# Patient Record
Sex: Female | Born: 1935 | Race: White | Hispanic: No | Smoking: Never smoker
Health system: Southern US, Community
[De-identification: ages and names within clinical notes are randomized; demographics above are authoritative.]

## PROBLEM LIST (undated history)

## (undated) DIAGNOSIS — N6009 Solitary cyst of unspecified breast: Secondary | ICD-10-CM

## (undated) DIAGNOSIS — E871 Hypo-osmolality and hyponatremia: Secondary | ICD-10-CM

## (undated) DIAGNOSIS — K8020 Calculus of gallbladder without cholecystitis without obstruction: Secondary | ICD-10-CM

## (undated) DIAGNOSIS — K5792 Diverticulitis of intestine, part unspecified, without perforation or abscess without bleeding: Secondary | ICD-10-CM

## (undated) DIAGNOSIS — I10 Essential (primary) hypertension: Secondary | ICD-10-CM

## (undated) HISTORY — DX: Diverticulitis of intestine, part unspecified, without perforation or abscess without bleeding: K57.92

## (undated) HISTORY — DX: Hypo-osmolality and hyponatremia: E87.1

## (undated) HISTORY — DX: Calculus of gallbladder without cholecystitis without obstruction: K80.20

## (undated) HISTORY — PX: NASAL SINUS SURGERY: SHX719

## (undated) HISTORY — DX: Essential (primary) hypertension: I10

## (undated) HISTORY — DX: Solitary cyst of unspecified breast: N60.09

---
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
## 1988-12-12 HISTORY — PX: BREAST CYST INCISION AND DRAINAGE: SHX14

## 1988-12-12 HISTORY — PX: OTHER SURGICAL HISTORY: SHX169

## 1998-04-13 HISTORY — PX: ABDOMINAL HYSTERECTOMY: SHX81

## 2003-04-25 ENCOUNTER — Other Ambulatory Visit: Payer: Self-pay

## 2004-08-29 ENCOUNTER — Ambulatory Visit: Payer: Self-pay | Admitting: Family Medicine

## 2005-09-01 ENCOUNTER — Ambulatory Visit: Payer: Self-pay | Admitting: Ophthalmology

## 2005-09-08 ENCOUNTER — Ambulatory Visit: Payer: Self-pay | Admitting: Ophthalmology

## 2005-09-17 ENCOUNTER — Ambulatory Visit: Payer: Self-pay | Admitting: Family Medicine

## 2006-09-23 ENCOUNTER — Ambulatory Visit: Payer: Self-pay | Admitting: Family Medicine

## 2006-12-22 ENCOUNTER — Ambulatory Visit: Payer: Self-pay | Admitting: Family Medicine

## 2006-12-23 ENCOUNTER — Ambulatory Visit: Payer: Self-pay | Admitting: Family Medicine

## 2006-12-24 ENCOUNTER — Ambulatory Visit: Payer: Self-pay | Admitting: Family Medicine

## 2006-12-27 ENCOUNTER — Ambulatory Visit: Payer: Self-pay | Admitting: Family Medicine

## 2007-03-01 ENCOUNTER — Ambulatory Visit: Payer: Self-pay | Admitting: Gastroenterology

## 2007-03-31 ENCOUNTER — Ambulatory Visit (HOSPITAL_COMMUNITY): Admission: RE | Admit: 2007-03-31 | Discharge: 2007-03-31 | Payer: Self-pay | Admitting: Gastroenterology

## 2007-03-31 ENCOUNTER — Encounter: Payer: Self-pay | Admitting: Gastroenterology

## 2007-04-05 ENCOUNTER — Ambulatory Visit: Payer: Self-pay | Admitting: Gastroenterology

## 2007-09-26 ENCOUNTER — Ambulatory Visit: Payer: Self-pay | Admitting: Family Medicine

## 2007-12-20 ENCOUNTER — Ambulatory Visit: Payer: Self-pay | Admitting: Gastroenterology

## 2008-03-05 ENCOUNTER — Telehealth: Payer: Self-pay | Admitting: Gastroenterology

## 2008-03-12 ENCOUNTER — Encounter: Payer: Self-pay | Admitting: Gastroenterology

## 2008-03-12 DIAGNOSIS — K863 Pseudocyst of pancreas: Secondary | ICD-10-CM

## 2008-03-12 DIAGNOSIS — K862 Cyst of pancreas: Secondary | ICD-10-CM | POA: Insufficient documentation

## 2008-03-15 ENCOUNTER — Encounter: Payer: Self-pay | Admitting: Gastroenterology

## 2008-03-27 ENCOUNTER — Ambulatory Visit (HOSPITAL_COMMUNITY): Admission: RE | Admit: 2008-03-27 | Discharge: 2008-03-27 | Payer: Self-pay | Admitting: Oncology

## 2008-08-23 ENCOUNTER — Emergency Department: Payer: Self-pay | Admitting: Emergency Medicine

## 2008-10-02 ENCOUNTER — Ambulatory Visit: Payer: Self-pay | Admitting: Family Medicine

## 2009-11-07 ENCOUNTER — Emergency Department: Payer: Self-pay | Admitting: Emergency Medicine

## 2009-11-13 ENCOUNTER — Encounter (INDEPENDENT_AMBULATORY_CARE_PROVIDER_SITE_OTHER): Payer: Self-pay | Admitting: *Deleted

## 2009-12-04 ENCOUNTER — Ambulatory Visit: Payer: Self-pay | Admitting: Family Medicine

## 2010-05-13 NOTE — Miscellaneous (Signed)
Summary: mrcp  Clinical Lists Changes  Orders: Added new Referral order of MRCP (MRCP) - Signed  Appended Document: mrcp Patient to have bun and creatine drawn at [facility], Dr Venia Minks will order.  Patient will have lab core fax labs here to Attn. Dr Ardis Hughs office.  Dr Venia Minks office number is [phone]  Appended Document: mrcp faxed labs from [place],  BUN 12, CR 1.3.  This should be safe for MRI that we want to arrange.  Appended Document: mrcp [facility] scheduling the are aware of bun creatine, pt to have the MRI 03/27/09

## 2010-05-13 NOTE — Letter (Signed)
Summary: Appointment - Missed  [facility], [place]  [address] [place]   [place], [place] [address]   Phone: [phone]  Fax: [phone]     November 13, 2009 MRN: [id number]   [facility] [address], [place]  [id number]   Dear Ms. Murrell,  Our records indicate you missed your appointment on  11-12-2009    with  Dr. Haroldine Laws  It is very important that we reach you to reschedule this appointment. We look forward to participating in your health care needs. Please contact us at the number listed above at your earliest convenience to reschedule this appointment.     Sincerely,   [place] Scheduling Team

## 2010-05-13 NOTE — Progress Notes (Signed)
Summary: [facility] EUS  Phone Note Call from Patient Call back at [facility] Phone [phone]   Caller: Patient Call For: JACOBS Reason for Call: Talk to Nurse Summary of Call: PT HAD AN EUS LAS DEC. AND WAS TOLD TO HAVE A REPEAT IN 12 MONTHS WOULD LIKE TO [facility] APP B4 THE END F THE YEAR  Initial call taken by: Ronalee Red,  March 05, 2008 10:06 AM  Follow-up for Phone Call        Spoke with patient she is looking to schedule an EUS before the end of the year.  Waiting for an answetr from Dr Ardis Hughs. Georgianne Fick, RN  March 06, 2008 11:52 AM       Appended Document: [facility] EUS Dr Ardis Hughs,  I sent the chart to you for a review, patient would like it done before the end of year then her insurance is changing,  please advise.  Appended Document: [facility] EUS sorry it took so long, please apologize to Mrs. Samand as well.  I actually recommended that she have an MRCP with MRI abd/pancreas at same time (not a repeat EUS) in 12/09.  Can you please schedule that for her.  Appended Document: [facility] EUS Patient scheduled for a mrcp and an mri abd/ pelvic.  Patient to be npo 4 hours before procedure.  Bun and creatine ordered before test.  Patient aware and understands.

## 2010-05-13 NOTE — Miscellaneous (Signed)
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

## 2011-03-08 ENCOUNTER — Observation Stay: Payer: Self-pay | Admitting: Specialist

## 2011-09-16 ENCOUNTER — Emergency Department: Payer: Self-pay | Admitting: Emergency Medicine

## 2012-02-18 ENCOUNTER — Ambulatory Visit: Payer: Self-pay | Admitting: Family Medicine

## 2012-06-08 ENCOUNTER — Ambulatory Visit: Payer: Self-pay | Admitting: Family Medicine

## 2012-12-09 ENCOUNTER — Emergency Department: Payer: Self-pay | Admitting: Emergency Medicine

## 2012-12-09 LAB — URINALYSIS, COMPLETE
Bilirubin,UR: NEGATIVE
Blood: NEGATIVE
Glucose,UR: NEGATIVE mg/dL (ref 0–75)
Hyaline Cast: 11
Ketone: NEGATIVE
Nitrite: NEGATIVE
Ph: 5 (ref 4.5–8.0)
Protein: NEGATIVE
RBC,UR: 1 /HPF (ref 0–5)
Specific Gravity: 1.019 (ref 1.003–1.030)
Squamous Epithelial: <1
WBC UR: 16 /HPF (ref 0–5)

## 2012-12-09 LAB — CBC
HCT: 36.9 % (ref 35.0–47.0)
HGB: 13 g/dL (ref 12.0–16.0)
MCH: 32.2 pg (ref 26.0–34.0)
MCHC: 35.3 g/dL (ref 32.0–36.0)
MCV: 91 fL (ref 80–100)
Platelet: 147 10*3/uL — ABNORMAL LOW (ref 150–440)
RBC: 4.04 10*6/uL (ref 3.80–5.20)
RDW: 13.2 % (ref 11.5–14.5)
WBC: 7.5 10*3/uL (ref 3.6–11.0)

## 2012-12-09 LAB — CK TOTAL AND CKMB (NOT AT ARMC)
CK, Total: 171 U/L (ref 21–215)
CK-MB: 3.9 ng/mL — ABNORMAL HIGH (ref 0.5–3.6)

## 2012-12-09 LAB — TROPONIN I: Troponin-I: 0.06 ng/mL — ABNORMAL HIGH

## 2012-12-09 LAB — COMPREHENSIVE METABOLIC PANEL
Albumin: 3.6 g/dL (ref 3.4–5.0)
Alkaline Phosphatase: 92 U/L (ref 50–136)
Anion Gap: 8 (ref 7–16)
BUN: 25 mg/dL — ABNORMAL HIGH (ref 7–18)
Bilirubin,Total: 0.3 mg/dL (ref 0.2–1.0)
Calcium, Total: 9.9 mg/dL (ref 8.5–10.1)
Chloride: 105 mmol/L (ref 98–107)
Co2: 23 mmol/L (ref 21–32)
Creatinine: 1.28 mg/dL (ref 0.60–1.30)
EGFR (African American): 44 — ABNORMAL LOW
EGFR (Non-African Amer.): 38 — ABNORMAL LOW
Glucose: 123 mg/dL — ABNORMAL HIGH (ref 65–99)
Osmolality: 278 (ref 275–301)
Potassium: 3.8 mmol/L (ref 3.5–5.1)
SGOT(AST): 32 U/L (ref 15–37)
SGPT (ALT): 17 U/L (ref 12–78)
Sodium: 136 mmol/L (ref 136–145)
Total Protein: 6.6 g/dL (ref 6.4–8.2)

## 2012-12-16 ENCOUNTER — Ambulatory Visit: Payer: Self-pay | Admitting: Family Medicine

## 2013-04-10 ENCOUNTER — Ambulatory Visit: Payer: Self-pay | Admitting: Family Medicine

## 2013-06-30 ENCOUNTER — Emergency Department: Payer: Self-pay | Admitting: Emergency Medicine

## 2013-06-30 LAB — COMPREHENSIVE METABOLIC PANEL
Albumin: 4.5 g/dL (ref 3.4–5.0)
Alkaline Phosphatase: 117 U/L
Anion Gap: 1 — ABNORMAL LOW (ref 7–16)
BUN: 22 mg/dL — ABNORMAL HIGH (ref 7–18)
Bilirubin,Total: 0.5 mg/dL (ref 0.2–1.0)
Calcium, Total: 10.3 mg/dL — ABNORMAL HIGH (ref 8.5–10.1)
Chloride: 96 mmol/L — ABNORMAL LOW (ref 98–107)
Co2: 32 mmol/L (ref 21–32)
Creatinine: 0.98 mg/dL (ref 0.60–1.30)
EGFR (African American): >60
EGFR (Non-African Amer.): 52 — ABNORMAL LOW
Glucose: 103 mg/dL — ABNORMAL HIGH (ref 65–99)
Osmolality: 263 (ref 275–301)
Potassium: 3.8 mmol/L (ref 3.5–5.1)
SGOT(AST): 32 U/L (ref 15–37)
SGPT (ALT): 18 U/L (ref 12–78)
Sodium: 129 mmol/L — ABNORMAL LOW (ref 136–145)
Total Protein: 8.2 g/dL (ref 6.4–8.2)

## 2013-06-30 LAB — CBC WITH DIFFERENTIAL/PLATELET
Basophil #: 0 10*3/uL (ref 0.0–0.1)
Basophil %: 0.5 %
Eosinophil #: 0.1 10*3/uL (ref 0.0–0.7)
Eosinophil %: 1.2 %
HCT: 38.5 % (ref 35.0–47.0)
HGB: 13.7 g/dL (ref 12.0–16.0)
Lymphocyte #: 1.8 10*3/uL (ref 1.0–3.6)
Lymphocyte %: 28.7 %
MCH: 32.9 pg (ref 26.0–34.0)
MCHC: 35.6 g/dL (ref 32.0–36.0)
MCV: 92 fL (ref 80–100)
Monocyte #: 0.5 x10 3/mm (ref 0.2–0.9)
Monocyte %: 8.5 %
Neutrophil #: 3.8 10*3/uL (ref 1.4–6.5)
Neutrophil %: 61.1 %
Platelet: 150 10*3/uL (ref 150–440)
RBC: 4.16 10*6/uL (ref 3.80–5.20)
RDW: 13 % (ref 11.5–14.5)
WBC: 6.2 10*3/uL (ref 3.6–11.0)

## 2013-06-30 LAB — URINALYSIS, COMPLETE
Bacteria: NONE SEEN
Bilirubin,UR: NEGATIVE
Blood: NEGATIVE
Glucose,UR: NEGATIVE mg/dL (ref 0–75)
Ketone: NEGATIVE
Nitrite: NEGATIVE
Ph: 7 (ref 4.5–8.0)
Protein: NEGATIVE
RBC,UR: <1 /HPF (ref 0–5)
Specific Gravity: 1.011 (ref 1.003–1.030)
Squamous Epithelial: NONE SEEN
WBC UR: 4 /HPF (ref 0–5)

## 2013-06-30 LAB — TROPONIN I: Troponin-I: 0.04 ng/mL

## 2013-08-09 ENCOUNTER — Inpatient Hospital Stay: Payer: Self-pay | Admitting: Internal Medicine

## 2013-08-09 LAB — COMPREHENSIVE METABOLIC PANEL
Albumin: 3.9 g/dL (ref 3.4–5.0)
Alkaline Phosphatase: 310 U/L — ABNORMAL HIGH
Anion Gap: 11 (ref 7–16)
BUN: 19 mg/dL — ABNORMAL HIGH (ref 7–18)
Bilirubin,Total: 2.1 mg/dL — ABNORMAL HIGH (ref 0.2–1.0)
Calcium, Total: 10 mg/dL (ref 8.5–10.1)
Chloride: 101 mmol/L (ref 98–107)
Co2: 25 mmol/L (ref 21–32)
Creatinine: 1.1 mg/dL (ref 0.60–1.30)
EGFR (African American): 53 — ABNORMAL LOW
EGFR (Non-African Amer.): 45 — ABNORMAL LOW
Glucose: 117 mg/dL — ABNORMAL HIGH (ref 65–99)
Osmolality: 277 (ref 275–301)
Potassium: 3.9 mmol/L (ref 3.5–5.1)
SGOT(AST): 1767 U/L — ABNORMAL HIGH (ref 15–37)
SGPT (ALT): 992 U/L — ABNORMAL HIGH (ref 12–78)
Sodium: 137 mmol/L (ref 136–145)
Total Protein: 7.8 g/dL (ref 6.4–8.2)

## 2013-08-09 LAB — CBC
HCT: 37.9 % (ref 35.0–47.0)
HGB: 12.8 g/dL (ref 12.0–16.0)
MCH: 31.8 pg (ref 26.0–34.0)
MCHC: 33.8 g/dL (ref 32.0–36.0)
MCV: 94 fL (ref 80–100)
Platelet: 138 10*3/uL — ABNORMAL LOW (ref 150–440)
RBC: 4.03 10*6/uL (ref 3.80–5.20)
RDW: 12.9 % (ref 11.5–14.5)
WBC: 6 10*3/uL (ref 3.6–11.0)

## 2013-08-09 LAB — URINALYSIS, COMPLETE
Bacteria: NONE SEEN
Bilirubin,UR: NEGATIVE
Glucose,UR: NEGATIVE mg/dL (ref 0–75)
Ketone: NEGATIVE
Leukocyte Esterase: NEGATIVE
Nitrite: NEGATIVE
Ph: 7 (ref 4.5–8.0)
Protein: 30
RBC,UR: 3 /HPF (ref 0–5)
Specific Gravity: 1.014 (ref 1.003–1.030)
Squamous Epithelial: NONE SEEN
WBC UR: <1 /HPF (ref 0–5)

## 2013-08-09 LAB — LIPASE, BLOOD: Lipase: 252 U/L (ref 73–393)

## 2013-08-09 LAB — CK TOTAL AND CKMB (NOT AT ARMC)
CK, Total: 159 U/L
CK-MB: 1.1 ng/mL (ref 0.5–3.6)

## 2013-08-09 LAB — PROTIME-INR
INR: 1
Prothrombin Time: 13.5 secs (ref 11.5–14.7)

## 2013-08-09 LAB — TROPONIN I: Troponin-I: 0.04 ng/mL

## 2013-08-10 LAB — CBC WITH DIFFERENTIAL/PLATELET
Basophil #: 0 10*3/uL (ref 0.0–0.1)
Basophil %: 0 %
Eosinophil #: 0 10*3/uL (ref 0.0–0.7)
Eosinophil %: 0 %
HCT: 33.7 % — ABNORMAL LOW (ref 35.0–47.0)
HGB: 11.6 g/dL — ABNORMAL LOW (ref 12.0–16.0)
Lymphocyte #: 0.4 10*3/uL — ABNORMAL LOW (ref 1.0–3.6)
Lymphocyte %: 4.1 %
MCH: 32.2 pg (ref 26.0–34.0)
MCHC: 34.3 g/dL (ref 32.0–36.0)
MCV: 94 fL (ref 80–100)
Monocyte #: 0.9 x10 3/mm (ref 0.2–0.9)
Monocyte %: 9.6 %
Neutrophil #: 7.8 10*3/uL — ABNORMAL HIGH (ref 1.4–6.5)
Neutrophil %: 86.3 %
Platelet: 126 10*3/uL — ABNORMAL LOW (ref 150–440)
RBC: 3.59 10*6/uL — ABNORMAL LOW (ref 3.80–5.20)
RDW: 13.1 % (ref 11.5–14.5)
WBC: 9 10*3/uL (ref 3.6–11.0)

## 2013-08-10 LAB — COMPREHENSIVE METABOLIC PANEL
Albumin: 3.3 g/dL — ABNORMAL LOW (ref 3.4–5.0)
Alkaline Phosphatase: 276 U/L — ABNORMAL HIGH
Anion Gap: 9 (ref 7–16)
BUN: 20 mg/dL — ABNORMAL HIGH (ref 7–18)
Bilirubin,Total: 2.5 mg/dL — ABNORMAL HIGH (ref 0.2–1.0)
Calcium, Total: 9.4 mg/dL (ref 8.5–10.1)
Chloride: 103 mmol/L (ref 98–107)
Co2: 26 mmol/L (ref 21–32)
Creatinine: 1.02 mg/dL (ref 0.60–1.30)
EGFR (African American): 58 — ABNORMAL LOW
EGFR (Non-African Amer.): 50 — ABNORMAL LOW
Glucose: 101 mg/dL — ABNORMAL HIGH (ref 65–99)
Osmolality: 278 (ref 275–301)
Potassium: 3.6 mmol/L (ref 3.5–5.1)
SGOT(AST): 1280 U/L — ABNORMAL HIGH (ref 15–37)
SGPT (ALT): 852 U/L — ABNORMAL HIGH (ref 12–78)
Sodium: 138 mmol/L (ref 136–145)
Total Protein: 6.7 g/dL (ref 6.4–8.2)

## 2013-08-11 LAB — CBC WITH DIFFERENTIAL/PLATELET
Basophil #: 0 10*3/uL (ref 0.0–0.1)
Basophil %: 0.2 %
Eosinophil #: 0 10*3/uL (ref 0.0–0.7)
Eosinophil %: 0.1 %
HCT: 29.8 % — ABNORMAL LOW (ref 35.0–47.0)
HGB: 10.2 g/dL — ABNORMAL LOW (ref 12.0–16.0)
Lymphocyte #: 0.6 10*3/uL — ABNORMAL LOW (ref 1.0–3.6)
Lymphocyte %: 6.5 %
MCH: 32 pg (ref 26.0–34.0)
MCHC: 34.1 g/dL (ref 32.0–36.0)
MCV: 94 fL (ref 80–100)
Monocyte #: 0.4 x10 3/mm (ref 0.2–0.9)
Monocyte %: 4 %
Neutrophil #: 8 10*3/uL — ABNORMAL HIGH (ref 1.4–6.5)
Neutrophil %: 89.2 %
Platelet: 108 10*3/uL — ABNORMAL LOW (ref 150–440)
RBC: 3.18 10*6/uL — ABNORMAL LOW (ref 3.80–5.20)
RDW: 13 % (ref 11.5–14.5)
WBC: 8.9 10*3/uL (ref 3.6–11.0)

## 2013-08-11 LAB — COMPREHENSIVE METABOLIC PANEL
Albumin: 2.6 g/dL — ABNORMAL LOW (ref 3.4–5.0)
Alkaline Phosphatase: 196 U/L — ABNORMAL HIGH
Anion Gap: 6 — ABNORMAL LOW (ref 7–16)
BUN: 21 mg/dL — ABNORMAL HIGH (ref 7–18)
Bilirubin,Total: 1.7 mg/dL — ABNORMAL HIGH (ref 0.2–1.0)
Calcium, Total: 8.4 mg/dL — ABNORMAL LOW (ref 8.5–10.1)
Chloride: 108 mmol/L — ABNORMAL HIGH (ref 98–107)
Co2: 25 mmol/L (ref 21–32)
Creatinine: 1.01 mg/dL (ref 0.60–1.30)
EGFR (African American): 58 — ABNORMAL LOW
EGFR (Non-African Amer.): 50 — ABNORMAL LOW
Glucose: 90 mg/dL (ref 65–99)
Osmolality: 280 (ref 275–301)
Potassium: 3.6 mmol/L (ref 3.5–5.1)
SGOT(AST): 324 U/L — ABNORMAL HIGH (ref 15–37)
SGPT (ALT): 413 U/L — ABNORMAL HIGH (ref 12–78)
Sodium: 139 mmol/L (ref 136–145)
Total Protein: 5.6 g/dL — ABNORMAL LOW (ref 6.4–8.2)

## 2013-08-12 LAB — COMPREHENSIVE METABOLIC PANEL
Albumin: 2.4 g/dL — ABNORMAL LOW (ref 3.4–5.0)
Alkaline Phosphatase: 198 U/L — ABNORMAL HIGH
Anion Gap: 7 (ref 7–16)
BUN: 16 mg/dL (ref 7–18)
Bilirubin,Total: 0.9 mg/dL (ref 0.2–1.0)
Calcium, Total: 8.5 mg/dL (ref 8.5–10.1)
Chloride: 111 mmol/L — ABNORMAL HIGH (ref 98–107)
Co2: 21 mmol/L (ref 21–32)
Creatinine: 0.76 mg/dL (ref 0.60–1.30)
EGFR (African American): >60
EGFR (Non-African Amer.): >60
Glucose: 85 mg/dL (ref 65–99)
Osmolality: 278 (ref 275–301)
Potassium: 3.3 mmol/L — ABNORMAL LOW (ref 3.5–5.1)
SGOT(AST): 131 U/L — ABNORMAL HIGH (ref 15–37)
SGPT (ALT): 241 U/L — ABNORMAL HIGH (ref 12–78)
Sodium: 139 mmol/L (ref 136–145)
Total Protein: 5.4 g/dL — ABNORMAL LOW (ref 6.4–8.2)

## 2013-08-15 LAB — CULTURE, BLOOD (SINGLE)

## 2013-08-29 ENCOUNTER — Encounter: Payer: Self-pay | Admitting: General Surgery

## 2013-08-29 ENCOUNTER — Ambulatory Visit (INDEPENDENT_AMBULATORY_CARE_PROVIDER_SITE_OTHER): Payer: Commercial Managed Care - HMO | Admitting: General Surgery

## 2013-08-29 VITALS — BP 134/86 | HR 68 | Resp 14 | Ht 63.0 in | Wt 132.0 lb

## 2013-08-29 DIAGNOSIS — K8050 Calculus of bile duct without cholangitis or cholecystitis without obstruction: Secondary | ICD-10-CM | POA: Insufficient documentation

## 2013-08-29 DIAGNOSIS — K8020 Calculus of gallbladder without cholecystitis without obstruction: Secondary | ICD-10-CM | POA: Insufficient documentation

## 2013-08-29 NOTE — Patient Instructions (Addendum)
Laparoscopic Cholecystectomy Laparoscopic cholecystectomy is surgery to remove the gallbladder. The gallbladder is located in the upper right part of the abdomen, behind the liver. It is a storage sac for bile produced in the liver. Bile aids in the digestion and absorption of fats. Cholecystectomy is often done for inflammation of the gallbladder (cholecystitis). This condition is usually caused by a buildup of gallstones (cholelithiasis) in your gallbladder. Gallstones can block the flow of bile, resulting in inflammation and pain. In severe cases, emergency surgery may be required. When emergency surgery is not required, you will have time to prepare for the procedure. Laparoscopic surgery is an alternative to open surgery. Laparoscopic surgery has a shorter recovery time. Your common bile duct may also need to be examined during the procedure. If stones are found in the common bile duct, they may be removed. LET [facility] CARE PROVIDER KNOW ABOUT:  Any allergies you have.  All medicines you are taking, including vitamins, herbs, eye drops, creams, and over-the-counter medicines.  Previous problems you or members of your family have had with the use of anesthetics.  Any blood disorders you have.  Previous surgeries you have had.  Medical conditions you have. RISKS AND COMPLICATIONS Generally, this is a safe procedure. However, as with any procedure, complications can occur. Possible complications include:  Infection.  Damage to the common bile duct, nerves, arteries, veins, or other internal organs such as the stomach, liver, or intestines.  Bleeding.  A stone may remain in the common bile duct.  A bile leak from the cyst duct that is clipped when your gallbladder is removed.  The need to convert to open surgery, which requires a larger incision in the abdomen. This may be necessary if your surgeon thinks it is not safe to continue with a laparoscopic procedure. BEFORE THE  PROCEDURE  Ask your health care provider about changing or stopping any regular medicines. You will need to stop taking aspirin or blood thinners at least 5 days prior to surgery.  Do not eat or drink anything after midnight the night before surgery.  Let your health care provider know if you develop a cold or other infectious problem before surgery. PROCEDURE   You will be given medicine to make you sleep through the procedure (general anesthetic). A breathing tube will be placed in your mouth.  When you are asleep, your surgeon will make several small cuts (incisions) in your abdomen.  A thin, lighted tube with a tiny camera on the end (laparoscope) is inserted through one of the small incisions. The camera on the laparoscope sends a picture to a TV screen in the operating room. This gives the surgeon a good view inside your abdomen.  A gas will be pumped into your abdomen. This expands your abdomen so that the surgeon has more room to perform the surgery.  Other tools needed for the procedure are inserted through the other incisions. The gallbladder is removed through one of the incisions.  After the removal of your gallbladder, the incisions will be closed with stitches, staples, or skin glue. AFTER THE PROCEDURE  You will be taken to a recovery area where your progress will be checked often.  You may be allowed to go home the same day if your pain is controlled and you can tolerate liquids. Document Released: 03/30/2005 Document Revised: 01/18/2013 Document Reviewed: 11/09/2012 [facility] Patient Information 2014 [place].   Patient has been scheduled for surgery at [facility] on 09/11/13. She will pre admit at  the hospital on 09/06/13 at 12:45 pm. Patient is aware of dates, time, and all instructions.

## 2013-08-29 NOTE — Progress Notes (Signed)
Patient ID: [name], female   DOB: Jan 21, 1925, 80 y.o.   MRN: [id number]  Chief Complaint  Patient presents with  . Abdominal Pain    evaluate gall bladder    HPI [name] is a 80 y.o. female.  Here for evaluation of abdominal pain and gallbladder. Patient states she has been having abdominal pain for three weeks. She was having sharp stabbing pain in her right upper quadrant and radiating to her back.   Patient was seen in the ER on 08/09/13, admitted with a diagnosis of cholangitis and underwent an ERCP with Verdie Shire, M.D. on 08/10/13.  A common bile duct stone was identified and removed. On presentation to the hospital the patient had marked elevation in her serum transaminases, SGPT 992, SGOT 1767. Total bilirubin 2.1. Alkaline phosphatase 310.  The patient reports feeling much better. She lives at home with a son who is a double amputee. HPI  Past Medical History  Diagnosis Date  . Hypertension   . Diverticulitis   . Gallstones   . Breast cyst     removed  . Low sodium levels     Past Surgical History  Procedure Laterality Date  . Bladder tack  1990's  . Appendectomy  1940's  . Abdominal hysterectomy  2000  . Breast cyst incision and drainage Left 1990's    No family history on file.  Social History History  Substance Use Topics  . Smoking status: Never Smoker   . Smokeless tobacco: Never Used  . Alcohol Use: No    Allergies  Allergen Reactions  . Keflex [Cephalexin] Rash    Current Outpatient Prescriptions  Medication Sig Dispense Refill  . enalapril (VASOTEC) 10 MG tablet Take 10 mg by mouth daily.       Marland Kitchen loratadine (ALLERGY RELIEF) 10 MG tablet Take 10 mg by mouth daily as needed for allergies.      . mirtazapine (REMERON) 15 MG tablet       . traZODone (DESYREL) 50 MG tablet Take 50 mg by mouth at bedtime.        No current facility-administered medications for this visit.    Review of Systems Review of Systems  Constitutional: Negative.    Respiratory: Negative.   Cardiovascular: Negative.   Gastrointestinal: Positive for abdominal pain. Negative for nausea, vomiting, diarrhea, constipation, blood in stool, abdominal distention, anal bleeding and rectal pain.    Blood pressure 134/86, pulse 68, resp. rate 14, height 5\' 3"  (1.6 m), weight 132 lb (59.875 kg).  Physical Exam Physical Exam  Constitutional: She is oriented to person, place, and time. She appears well-developed and well-nourished.  Eyes: Conjunctivae are normal.  Neck: Neck supple.  Cardiovascular: Normal rate, regular rhythm and normal heart sounds.   Pulmonary/Chest: Effort normal and breath sounds normal.  Abdominal: Soft. Normal appearance and bowel sounds are normal. There is no hepatomegaly. There is no tenderness.  Neurological: She is alert and oriented to person, place, and time.  Skin: Skin is warm and dry.    Data Reviewed ERCP report of August 10, 2013.  Hospital discharge summary.  Laboratory studies dated Aug 25, 2013 from her primary physician's office showed a hemoglobin of 11.7, white blood cell count of 5900. Normal differential. Estimated GFR of 56, BUN: 19, creatinine 0.9, sodium 132, bilirubin 0.4, alkaline phosphatase 145, normal SGOT and SGPT.  Assessment    Recent choledocholithiasis with sepsis, resolved. Cholelithiasis. Past history hyponatremia. Essential hypertension.     Plan  Indication for elective cholecystectomy to prevent further episodes of cholangitis were reviewed. The risks are felt to be modest and para-to the benefit of preventing further episodes of sepsis. The possibility of bleeding, infection, cardiovascular complications and bile duct injury were reviewed. An informational pressure was provided.  Considering her home situation, I think she will likely benefit from overnight observation be sure she is ambulating well. She reported that during her recent 4 day stay in the hospital "they never gotten out of bed,  and I was weak when I went home". We'll try not to let that happen again.     Patient has been scheduled for surgery at [facility] on 09/11/13. She will pre admit at the hospital on 09/06/13 at 12:45 pm. Patient is aware of dates, time, and all instructions.   PCP: Lelan Pons Byrnett 08/29/2013, 8:25 PM

## 2013-08-30 ENCOUNTER — Other Ambulatory Visit: Payer: Self-pay | Admitting: General Surgery

## 2013-08-30 DIAGNOSIS — K8050 Calculus of bile duct without cholangitis or cholecystitis without obstruction: Secondary | ICD-10-CM

## 2013-09-06 ENCOUNTER — Ambulatory Visit: Payer: Self-pay | Admitting: General Surgery

## 2013-09-11 ENCOUNTER — Ambulatory Visit: Payer: Self-pay | Admitting: General Surgery

## 2013-09-11 ENCOUNTER — Encounter: Payer: Self-pay | Admitting: General Surgery

## 2013-09-11 DIAGNOSIS — K8010 Calculus of gallbladder with chronic cholecystitis without obstruction: Secondary | ICD-10-CM

## 2013-09-11 HISTORY — PX: CHOLECYSTECTOMY: SHX55

## 2013-09-12 ENCOUNTER — Encounter: Payer: Self-pay | Admitting: General Surgery

## 2013-09-12 HISTORY — PX: ERCP W/ SPHICTEROTOMY: SHX1523

## 2013-09-12 LAB — BASIC METABOLIC PANEL
Anion Gap: 8 (ref 7–16)
BUN: 10 mg/dL (ref 7–18)
Calcium, Total: 8.7 mg/dL (ref 8.5–10.1)
Chloride: 100 mmol/L (ref 98–107)
Co2: 26 mmol/L (ref 21–32)
Creatinine: 0.95 mg/dL (ref 0.60–1.30)
EGFR (African American): >60
EGFR (Non-African Amer.): 54 — ABNORMAL LOW
Glucose: 83 mg/dL (ref 65–99)
Osmolality: 266 (ref 275–301)
Potassium: 4.1 mmol/L (ref 3.5–5.1)
Sodium: 134 mmol/L — ABNORMAL LOW (ref 136–145)

## 2013-09-12 LAB — HEPATIC FUNCTION PANEL A (ARMC)
Albumin: 2.8 g/dL — ABNORMAL LOW (ref 3.4–5.0)
Alkaline Phosphatase: 246 U/L — ABNORMAL HIGH
Bilirubin, Direct: 0.4 mg/dL — ABNORMAL HIGH (ref 0.00–0.20)
Bilirubin,Total: 1.2 mg/dL — ABNORMAL HIGH (ref 0.2–1.0)
SGOT(AST): 935 U/L — ABNORMAL HIGH (ref 15–37)
SGPT (ALT): 695 U/L — ABNORMAL HIGH (ref 12–78)
Total Protein: 5.7 g/dL — ABNORMAL LOW (ref 6.4–8.2)

## 2013-09-12 LAB — PATHOLOGY REPORT

## 2013-09-13 ENCOUNTER — Encounter: Payer: Self-pay | Admitting: General Surgery

## 2013-09-13 LAB — COMPREHENSIVE METABOLIC PANEL
Albumin: 2.8 g/dL — ABNORMAL LOW (ref 3.4–5.0)
Alkaline Phosphatase: 203 U/L — ABNORMAL HIGH
Anion Gap: 5 — ABNORMAL LOW (ref 7–16)
BUN: 12 mg/dL (ref 7–18)
Bilirubin,Total: 0.7 mg/dL (ref 0.2–1.0)
Calcium, Total: 8.8 mg/dL (ref 8.5–10.1)
Chloride: 101 mmol/L (ref 98–107)
Co2: 26 mmol/L (ref 21–32)
Creatinine: 0.83 mg/dL (ref 0.60–1.30)
EGFR (African American): >60
EGFR (Non-African Amer.): >60
Glucose: 58 mg/dL — ABNORMAL LOW (ref 65–99)
Osmolality: 262 (ref 275–301)
Potassium: 3.7 mmol/L (ref 3.5–5.1)
SGOT(AST): 384 U/L — ABNORMAL HIGH (ref 15–37)
SGPT (ALT): 396 U/L — ABNORMAL HIGH (ref 12–78)
Sodium: 132 mmol/L — ABNORMAL LOW (ref 136–145)
Total Protein: 5.7 g/dL — ABNORMAL LOW (ref 6.4–8.2)

## 2013-09-20 ENCOUNTER — Ambulatory Visit (INDEPENDENT_AMBULATORY_CARE_PROVIDER_SITE_OTHER): Payer: Self-pay | Admitting: General Surgery

## 2013-09-20 ENCOUNTER — Encounter: Payer: Self-pay | Admitting: General Surgery

## 2013-09-20 VITALS — BP 128/72 | HR 68 | Resp 14 | Ht 63.0 in | Wt 130.0 lb

## 2013-09-20 DIAGNOSIS — K8050 Calculus of bile duct without cholangitis or cholecystitis without obstruction: Secondary | ICD-10-CM

## 2013-09-20 DIAGNOSIS — E871 Hypo-osmolality and hyponatremia: Secondary | ICD-10-CM

## 2013-09-20 NOTE — Progress Notes (Signed)
Patient ID: [name], female   DOB: 1924-11-15, 80 y.o.   MRN: [id number]  Chief Complaint  Patient presents with  . Routine Post Op    Gallbladder    HPI [name] is a 80 y.o. female here today for her post op gallbladder surgery done on 09/11/13. Patient states she is doing well. Her energy level is not totally back to normal, but considering her surgery followed by ERCP this is not unexpected. She denies any dietary intolerant. No difficulty with diarrhea. Postprandial pain. HPI  Past Medical History  Diagnosis Date  . Hypertension   . Diverticulitis   . Gallstones   . Breast cyst     removed  . Low sodium levels     Past Surgical History  Procedure Laterality Date  . Bladder tack  1990's  . Appendectomy  1940's  . Abdominal hysterectomy  2000  . Breast cyst incision and drainage Left 1990's  . Ercp w/ sphicterotomy  09/12/2013    Recurrent stone identified post cholecystectomy.  . Cholecystectomy  09/11/13    Cholangiogram suggested a retained stone ductal dilatation.    No family history on file.  Social History History  Substance Use Topics  . Smoking status: Never Smoker   . Smokeless tobacco: Never Used  . Alcohol Use: No    Allergies  Allergen Reactions  . Keflex [Cephalexin] Rash    Current Outpatient Prescriptions  Medication Sig Dispense Refill  . enalapril (VASOTEC) 10 MG tablet Take 10 mg by mouth daily.       Marland Kitchen HYDROcodone-acetaminophen (NORCO/VICODIN) 5-325 MG per tablet Take 1 tablet by mouth as needed.      . loratadine (ALLERGY RELIEF) 10 MG tablet Take 10 mg by mouth daily as needed for allergies.      . mirtazapine (REMERON) 15 MG tablet       . traZODone (DESYREL) 50 MG tablet Take 50 mg by mouth at bedtime.        No current facility-administered medications for this visit.    Review of Systems Review of Systems  Constitutional: Negative.   Respiratory: Negative.   Cardiovascular: Negative.     Blood pressure 128/72, pulse  68, resp. rate 14, height 5\' 3"  (1.6 m), weight 130 lb (58.968 kg).  Physical Exam Physical Exam  Constitutional: She is oriented to person, place, and time. She appears well-developed and well-nourished.  Eyes: Conjunctivae are normal. No scleral icterus.  Neck: Neck supple.  Cardiovascular: Normal rate, regular rhythm and normal heart sounds.   Pulmonary/Chest: Effort normal and breath sounds normal.  Abdominal: Soft. Normal appearance and bowel sounds are normal. There is no tenderness.  Port sites looks clean and healing well.   Neurological: She is alert and oriented to person, place, and time.  Skin: Skin is warm and dry.    Data Reviewed Liver function studies obtained the day after the ERCP showed a marked improvement.  Assessment    Doing well status post cholecystectomy and ERCP.    Plan    We'll repeat her laboratory studies today. If they have return to near-normal values we'll plan for a followup on an as-needed basis.     PCP: Etheleen Mayhew 09/21/2013, 7:10 AM

## 2013-09-21 ENCOUNTER — Telehealth: Payer: Self-pay | Admitting: *Deleted

## 2013-09-21 ENCOUNTER — Encounter: Payer: Self-pay | Admitting: General Surgery

## 2013-09-21 LAB — BASIC METABOLIC PANEL
BUN/Creatinine Ratio: 11 (ref 11–26)
BUN: 9 mg/dL (ref 8–27)
CO2: 25 mmol/L (ref 18–29)
Calcium: 9.8 mg/dL (ref 8.7–10.3)
Chloride: 92 mmol/L — ABNORMAL LOW (ref 97–108)
Creatinine, Ser: 0.82 mg/dL (ref 0.57–1.00)
GFR calc Af Amer: 75 mL/min/{1.73_m2} (ref >59)
GFR calc non Af Amer: 65 mL/min/{1.73_m2} (ref >59)
Glucose: 93 mg/dL (ref 65–99)
Potassium: 4.6 mmol/L (ref 3.5–5.2)
Sodium: 129 mmol/L — ABNORMAL LOW (ref 134–144)

## 2013-09-21 LAB — HEPATIC FUNCTION PANEL
ALT: 57 IU/L — ABNORMAL HIGH (ref 0–32)
AST: 30 IU/L (ref 0–40)
Albumin: 4.5 g/dL (ref 3.5–4.7)
Alkaline Phosphatase: 155 IU/L — ABNORMAL HIGH (ref 39–117)
Bilirubin, Direct: 0.14 mg/dL (ref 0.00–0.40)
Total Bilirubin: 0.3 mg/dL (ref 0.0–1.2)
Total Protein: 6.7 g/dL (ref 6.0–8.5)

## 2013-09-21 NOTE — Telephone Encounter (Signed)
Message copied by Carson Myrtle on Thu Sep 21, 2013  8:43 AM ------      Message from: [place], Forest Gleason      Created: Thu Sep 21, 2013  7:38 AM       Notify the patient her labs look fine. Her sodium is almost normal, but good for her.  Follow up as needed with Dr. Venia Minks.      ----- Message -----         From: [facility] Lab Results In Interface         Sent: 09/21/2013   5:44 AM           To: Robert Bellow, MD                   ------

## 2013-09-21 NOTE — Telephone Encounter (Signed)
Notified patient as instructed, patient pleased. Discussed follow-up appointments with Dr Venia Minks, patient agrees

## 2014-02-12 ENCOUNTER — Encounter: Payer: Self-pay | Admitting: General Surgery

## 2014-03-06 ENCOUNTER — Ambulatory Visit (INDEPENDENT_AMBULATORY_CARE_PROVIDER_SITE_OTHER): Payer: Commercial Managed Care - HMO | Admitting: General Surgery

## 2014-03-06 ENCOUNTER — Encounter: Payer: Self-pay | Admitting: General Surgery

## 2014-03-06 VITALS — BP 140/72 | HR 76 | Resp 14 | Ht 63.0 in | Wt 126.0 lb

## 2014-03-06 DIAGNOSIS — K5909 Other constipation: Secondary | ICD-10-CM

## 2014-03-06 DIAGNOSIS — R1030 Lower abdominal pain, unspecified: Secondary | ICD-10-CM

## 2014-03-06 DIAGNOSIS — K648 Other hemorrhoids: Secondary | ICD-10-CM

## 2014-03-06 DIAGNOSIS — R1031 Right lower quadrant pain: Secondary | ICD-10-CM

## 2014-03-06 LAB — POC HEMOCCULT BLD/STL (OFFICE/1-CARD/DIAGNOSTIC): Fecal Occult Blood, POC: NEGATIVE

## 2014-03-06 NOTE — Progress Notes (Signed)
Patient ID: [name], female   DOB: January 21, 1925, 80 y.o.   MRN: [id number]  Chief Complaint  Patient presents with  . Other    rigth groin pain    HPI [name] is a 80 y.o. female here today for a evaluation of right groin pain. Patient states this has been going on for for a week. The sharpe pain was off and on.  After she saw Dr. Kenton [name] 02/19/14 she states she has had no more pain.    The patient does report that her laminations seem less complete than in the past. Moderate straining at stool. She's been aware of a small hemorrhoid and has on occasion made use of a medicated suppository with inconsistent results. She denies any bleeding or mucus in the stools. She reports her energy and appetite are at baseline. HPI  Past Medical History  Diagnosis Date  . Hypertension   . Diverticulitis   . Gallstones   . Breast cyst     removed  . Low sodium levels     Past Surgical History  Procedure Laterality Date  . Bladder tack  1990's  . Appendectomy  1940's  . Abdominal hysterectomy  2000  . Breast cyst incision and drainage Left 1990's  . Ercp w/ sphicterotomy  09/12/2013    Recurrent stone identified post cholecystectomy.  . Cholecystectomy  09/11/13    Cholangiogram suggested a retained stone ductal dilatation.    No family history on file.  Social History History  Substance Use Topics  . Smoking status: Never Smoker   . Smokeless tobacco: Never Used  . Alcohol Use: No    Allergies  Allergen Reactions  . Keflex [Cephalexin] Rash    Current Outpatient Prescriptions  Medication Sig Dispense Refill  . enalapril (VASOTEC) 10 MG tablet Take 10 mg by mouth daily.     Marland Kitchen loratadine (ALLERGY RELIEF) 10 MG tablet Take 10 mg by mouth daily as needed for allergies.    . traZODone (DESYREL) 50 MG tablet Take 50 mg by mouth at bedtime.      No current facility-administered medications for this visit.    Review of Systems Review of Systems  Constitutional: Negative.    Respiratory: Negative.   Cardiovascular: Negative.   Gastrointestinal: Positive for constipation. Negative for nausea, vomiting, abdominal pain, diarrhea, blood in stool, abdominal distention, anal bleeding and rectal pain.    Blood pressure 140/72, pulse 76, resp. rate 14, height 5\' 3"  (1.6 m), weight 126 lb (57.153 kg).   The patient's weight is down 2 pounds from her June 2015 exam.  Physical Exam Physical Exam  Constitutional: She is oriented to person, place, and time. She appears well-developed and well-nourished.  Eyes: Conjunctivae are normal. No scleral icterus.  Neck: Neck supple.  Cardiovascular: Normal rate, regular rhythm and normal heart sounds.   Pulmonary/Chest: Effort normal and breath sounds normal.  Abdominal: Soft. Normal appearance and bowel sounds are normal. There is no tenderness. No hernia.    Genitourinary: Rectal exam shows internal hemorrhoid.  Digital rectal exam showed a mild decreased tone. No rectal masses. Stool is Hemoccult negative. With vigorous straining a small 6 mm internal hemorrhoid was noted on the left anterior wall of the anus. No bleeding or friability noted.   Lymphadenopathy:    She has no cervical adenopathy.  Neurological: She is alert and oriented to person, place, and time.  Skin: Skin is warm and dry.    Assessment    Benign abdominal exam.  Moderate constipation, possible benefit to stool bulking agents.    Plan    Patient to return as needed. The patient will be encouraged to make use of a daily fiber supplement.        Robert Bellow 03/07/2014, 2:37 PM

## 2014-03-06 NOTE — Patient Instructions (Signed)
Patient to return as needed. The patient will be encouraged to make use of a daily fiber supplement.

## 2014-03-07 DIAGNOSIS — K5909 Other constipation: Secondary | ICD-10-CM | POA: Insufficient documentation

## 2014-03-07 DIAGNOSIS — R1031 Right lower quadrant pain: Secondary | ICD-10-CM | POA: Insufficient documentation

## 2014-03-07 DIAGNOSIS — K648 Other hemorrhoids: Secondary | ICD-10-CM | POA: Insufficient documentation

## 2014-03-18 ENCOUNTER — Encounter: Payer: Self-pay | Admitting: *Deleted

## 2014-08-04 NOTE — Consult Note (Signed)
PATIENT NAMEVESTA, [name] MR#:  [id number] DATE OF BIRTH:  November 09, 1924  DATE OF CONSULTATION:  08/10/2013  REFERRING PHYSICIAN:   CONSULTING PHYSICIAN: [facility],  Payton Emerald, NP, Dr. Verdie Shire.  PRIMARY CARE PHYSICIAN: Dr. Margarita Rana  ATTENDING: Dr. Lunette Stands   REASON FOR CONSULTATION: Abdominal pain.   HISTORY OF PRESENT ILLNESS: [name] is an 81 year old Caucasian female who presented to [facility] Emergency Room yesterday with a history of acute onset of nausea, vomiting and abdominal pain. She states that she had eaten cereal yesterday morning and then at around noon, she started to experience abdominal pain which was followed by an episode of vomiting at 1:00 p.m. She went to see her primary care doctor for adjustment of her blood pressure medications as she has recently been diagnosed with hyponatremia felt to be hypertensive medication induced. Apparently, she also had several additional episodes of nausea and vomiting; thus, she came to the Emergency Room for evaluation. The patient had an abdominal ultrasound that was done, which revealed evidence of multiple small gallstones without significant gallbladder wall thickening or pericholecystic fluid. Common bile duct measured 11 mm distally with probable stone being in distal common bile duct, mild intrahepatic biliary dilatation noted.   Laboratory studies revealed total bilirubin to be elevated at 2.1, today 2.5. Alkaline phosphatase was 310, today is 276. AST was 1767 and today 1280, and ALT was 992 and has dropped to 852. Hemoglobin 12.8 on admission and currently 11.6. PT is 13.5 with an INR of 1.0. The patient states that she is continuing to experience abdominal pain, generalized, was more localized epigastric right upper quadrant. No further episodes of vomiting today. She does complain though of experiencing constipation over the past week or so, again with a notation of changes in blood pressure medication. No rectal bleeding. No  melena. No fevers.   PAST MEDICAL HISTORY: Hypertension, hyperlipidemia, history of pancreatitis diverticulitis, asthma.   PAST SURGICAL HISTORY: Hysterectomy, sinus surgery, appendectomy, bladder tack, colonoscopy.   ALLERGIES: ACCOLATE, ASPIRIN, [place].   HOME MEDICATIONS: Trazodone 50 mg at bedtime, loratadine 10 mg daily, enalapril 5 mg a day.   SOCIAL HISTORY: No tobacco. No alcohol use. Resides by herself.   FAMILY HISTORY: Significant for hypertension. No history of neoplasm.   REVIEW OF SYSTEMS:  CONSTITUTIONAL: Experiencing generalized weakness, fatigue, dizziness, which has somewhat improved with the recent adjustment of her blood pressure medications. Recently diagnosed with hyponatremia.  EYES: No blurred vision, double vision.  ENT: No hearing loss, epistaxis.  RESPIRATORY: No coughing. No wheezing.  CARDIOVASCULAR: No chest pain or palpitations.  GASTROINTESTINAL: See HPI.  GENITOURINARY: Denies any dysuria or hematuria.  ENDOCRINE: No polyuria or polydipsia.  HEMATOLOGIC: Denies significant easy bruising or bleeding.  SKIN: No rashes. No lesions.  MUSCULOSKELETAL: No myalgias or arthralgias.  NEUROLOGICAL: No history of CVA, TIA or seizure disorder.   PHYSICAL EXAMINATION: VITAL SIGNS: Temperature is 98.6 with a pulse of 74, respirations 20, blood pressure is 114/72 with a pulse oximetry of 95% on room air.  GENERAL: Well-developed, well-nourished 81 year old Caucasian female. Mild distress noted from appearing to be uncomfortable, rubbing her abdomen. Family member at bedside.  HEENT: Normocephalic, atraumatic. Pupils equal, reactive to light. Conjunctivae clear. Sclerae anicteric.  NECK: Supple. Trachea midline. No lymphadenopathy or thyromegaly.  PULMONARY: Symmetric rise and fall of chest. Clear to auscultation throughout.  CARDIOVASCULAR: Regular rhythm, S1, S2. No murmurs. No gallops.  ABDOMEN: Slightly distended. Slight hypoactive bowel sounds. Marked  discomfort noted epigastric  right upper quadrant. No rebound tenderness. No bruits. No masses. No evidence of hepatosplenomegaly, but true deep palpation was not performed given the intensity of pain.  RECTAL: Deferred.  MUSCULOSKELETAL: Moving all 4 extremities. No contractures. No clubbing.  EXTREMITIES: No edema.  PSYCHIATRIC: Alert and oriented x 4. Memory grossly intact. Appropriate affect and mood. NEUROLOGIC: No gross neurological deficits.   LABORATORY, DIAGNOSTIC, AND RADIOLOGICAL DATA STUDY: Findings as noted under history. Glucose was elevated on admission at 117, has improved to 101. BUN 19, currently is 20. EGFR is range of 45% to 50%. Blood cultures x 2: No growth in 8 to 12 hours. Urinalysis: +1 blood, protein 30 mg/dL; negative for nitrites and leukocytes. A CT scan of head without contrast was done for altered mental status. Impression is diffuse cortical atrophy, minimal chronic ischemic white matter disease. No acute intracranial abnormalities noted.   Chest, PA and lateral: No evidence of pneumonia or congestive heart failure. There may be minimal subsegmental atelectasis at the left lung base.   IMPRESSION: Choledocholithiasis with obstruction, known history of hyponatremia, hypertension, elevation in transaminase levels in correlation with presumed to be in correlation with finding of CBD stone.   PLAN: The patient's presentation was discussed with Dr. Verdie Shire. The patient is to proceed forward with an ERCP today. The procedure, risks versus benefits discussed. Order has been placed. The patient is currently receiving Zosyn 3.375 g every 8 hours. She is to remain n.p.o. and Lovenox was held this morning. We will continue to monitor.   These services provided by Payton Emerald, MS, APRN, [facility], FNP, under collaborative agreement with Dr. Verdie Shire.   ____________________________ Payton Emerald, NP dsh:sg D: 08/10/2013 12:20:00 ET T: 08/10/2013 12:50:56  ET JOB#: [id number]  cc: Payton Emerald, NP, <Dictator>   Payton Emerald MD ELECTRONICALLY SIGNED 08/17/2013 7:41

## 2014-08-04 NOTE — H&P (Signed)
PATIENT NAMEBRITAIN, [name] MR#:  [id number] DATE OF BIRTH:  May 04, 1924  DATE OF ADMISSION:  08/09/2013  PRIMARY CARE PHYSICIAN: Dr. Margarita Rana.    REFERRING PHYSICIAN: Dr. Lenise Arena.   CHIEF COMPLAINT: Nausea, vomiting, abdominal pain.   HISTORY OF PRESENT ILLNESS: [name] is an 81 year old, pleasant, white female with past medical history of hypertension, previous history of pancreatitis and diverticulosis. Presented to the Emergency Department with complaints of sudden onset of right upper quadrant abdominal pain, nausea, vomiting, multiple episodes. Started since this afternoon. The patient went to see her primary care physician for adjustment of her blood pressure medications. About an hour after eating lunch, started to experience pain in the right upper quadrant. No radiation. Associated with multiple episodes of nausea and vomiting. Concerning this, came to the Emergency Department. Workup in the Emergency Department with CT abdomen and pelvis consistent with choledocholithiasis. The patient has elevated LFTs with AST of 1700 and ALT of 1000, with elevated alk phos of 310, total bilirubin of 2.1. A right upper quadrant ultrasound was done, showed CBD is 11 mm distally with a probable stone in the distal common bile duct. Discussed with gastrointestinal diseases, Dr. Rayann Heman, who will see the patient in the morning. The patient also received 1 dose of Zosyn in the Emergency Department.   PAST MEDICAL HISTORY:  1. Hypertension.  2. Hyperlipidemia.  3. Previous history of pancreatitis.  4. History of diverticulitis.  5. Asthma.   PAST SURGICAL HISTORY:  1. Hysterectomy.  2. Sinus surgery.  3. Appendectomy.  4. Bladder tack.   ALLERGIES:  1. ACCOLATE.   2. ASPIRIN.  3. KEFLEX.   HOME MEDICATIONS:  1. Trazodone 50 mg at bedtime.  2. Loratadine 10 mg daily.  3. Enalapril 5 mg daily.   SOCIAL HISTORY: No history of smoking, drinking alcohol or using illicit drugs. Lives  by herself. Independent of ADLs.   FAMILY HISTORY: History of hypertension.   REVIEW OF SYSTEMS:  CONSTITUTIONAL: Has been experiencing generalized weakness, fatigue, dizziness; however, improved since the blood pressure medications have been adjusted.  EYES: No change in vision.  ENT: No change in hearing.  RESPIRATORY: No cough, shortness of breath.  CARDIOVASCULAR: No chest pain, palpations.  GASTROINTESTINAL: Nausea, vomiting, abdominal pain.  GENITOURINARY: No dysuria or hematuria.  ENDOCRINE: No polyuria or polydipsia.  HEMATOLOGIC: No easy bruising or bleeding.  SKIN: No rash or lesions.  MUSCULOSKELETAL: No joint pains and aches.  NEUROLOGIC: No  or numbness in any part of the body.   PHYSICAL EXAMINATION:  GENERAL: This is a well-built, well-nourished, age-appropriate female lying down in the bed, not in distress.  VITAL SIGNS: Temperature 98.6, pulse 88, blood pressure 145/70, respiratory rate of 16, oxygen saturation is 95% on room air.  HEENT: Head normocephalic, atraumatic. There is no scleral icterus. Conjunctivae normal. Pupils equal and react to light. Extraocular movements are intact. Mucous membranes moist. No pharyngeal erythema.  NECK: Supple. No lymphadenopathy. No JVD. No carotid bruit. No thyromegaly.  CHEST: Has no focal tenderness.  LUNGS: Bilaterally clear to auscultation.  HEART: S1, S2, regular, tachycardia.  ABDOMEN: Bowel sounds are present. Soft. Has tenderness in the right upper quadrant and in the epigastric area. Guarding in that area. No rebound. Could not appreciate any hepatosplenomegaly.  EXTREMITIES: No pedal edema. Pulses 2+.  SKIN: No rash or lesions.  MUSCULOSKELETAL: Good range of motion in all of the extremities.  NEUROLOGIC: The patient is alert, oriented to place, person, and time. Cranial  nerves II through XII intact. Motor 5/5 in upper and lower extremities.   LABORATORIES: UA negative for nitrites and leukocyte esterase. Ultrasound of  the abdomen shows common bile duct of 11 mm with distal dilatation. Possible stone. CT head without contrast: No acute intracranial abnormality. Chest x-ray, PA and lateral: No acute cardiopulmonary disease. Minimal subsegmental atelectasis in the left lung base. CMP: Alcohol phos of 310, ALT 1000, AST of , total bilirubin of 2.1. CBC: WBC of 6, hemoglobin 12.8, platelet count of 138.   Initial set of cardiac enzymes is negative. Lipase is 252.   ASSESSMENT AND PLAN: [name] is an 81 year old female who comes to the Emergency Department with choledocholithiasis.  1. Choledocholithiasis: The patient does not have any elevated white blood cell count, afebrile; however, concerning the location of the stone, will treat with Zosyn to prevent cholangitis. Consult gastroenterology for possible ERCP in the morning. Keep the patient n.p.o. Continue with intravenous fluids. Pain management as needed.  2. Hypertension: Will hold the blood pressure medications for now. Currently well controlled.  3. Keep the patient on deep vein thrombosis prophylaxis with Lovenox.   TIME SPENT: 50 minutes.   ____________________________ Monica Becton, MD pv:gb D: 08/10/2013 00:00:58 ET T: 08/10/2013 00:37:15 ET JOB#: [id number]  cc: Monica Becton, MD, <Dictator> Jerrell Belfast, MD Monica Becton MD ELECTRONICALLY SIGNED 08/10/2013 21:35

## 2014-08-04 NOTE — Consult Note (Signed)
PATIENT NAME:  [name], [name] MR#:  [id number] DATE OF BIRTH:  01/16/1925  DATE OF CONSULTATION:  08/11/2013  CONSULTING PHYSICIAN:  Christopher A. Lundquist, MD  REASON FOR CONSULTATION: Choledocholithiasis status post ERCP and stone extraction.   HISTORY OF PRESENT ILLNESS: [name] is a pleasant 81-year-old female with history of hypertension, pancreatitis and diverticulosis. She presented with severe right upper quadrant pain, nausea, vomiting on April 29.  She went to see her primary care physician and after eating lunch began having right upper quadrant pain which, according to her, radiates to the back. She went to the Emergency Room. She was noted to have an AST of 1700, ALT of 1000, alk phos of 310, bilirubin of 2.1. Right upper quadrant ultrasound showed dilated bile duct with possible stone and underlying ERCP with sphincterotomy and stone extraction. Otherwise, feels good now. No fevers, chills, night sweats, shortness of breath, cough, chest pain. Current, does have minimal abdominal pain, nausea, vomiting, diarrhea, constipation, dysuria, hematuria.   PAST MEDICAL HISTORY: Hypertension, hyperlipidemia, pancreatitis, diverticulitis, asthma, status post hysterectomy, status post sinus surgery, status post appendectomy, status post bladder tack.   ALLERGIES: ACCOLATE, ASPIRIN AND KEFLEX.   HOME MEDICATIONS: Trazodone 50 mg p.o. at bedtime, loratadine 10 mg p.o. daily, enalapril 5 mg p.o. daily.   SOCIAL HISTORY: Denies alcohol or drug.   FAMILY HISTORY: History of hypertension.  REVIEW OF SYSTEMS: A 12-point review of systems obtained.  Pertinent positives and negatives as above.   PHYSICAL EXAMINATION: VITAL SIGNS: Temperature 97.6, pulse 67, blood pressure 162/79, respirations 19, 97% on room air.  GENERAL: No acute distress, alert and oriented x 3.  HEAD: Normocephalic, atraumatic.  EYES: No scleral icterus. No conjunctivitis. FACE:  No obvious facial trauma.  Normal external  nose. Normal external ears.  CHEST: Lungs clear to auscultation. Moving air well.  HEART: Regular rate and rhythm. No murmurs, rubs or gallops.  ABDOMEN: Soft, nontender, nondistended.  EXTREMITIES: Moves all extremities well. Strength 5 out of 5.  NEUROLOGIC: Cranial nerves II through XII grossly intact.   LABORATORY DATA: Currently bilirubin 2.6, down from 3.9 on admission. AST is 324. ALT is 413. Alk phos is 196, white cell count 8.9.   IMAGING: Ultrasound showed a dilated bile duct and gallstones.   ASSESSMENT AND PLAN:  [name] is a pleasant 81-year-old female with history of choledocholithiasis who has had ERCP, sphincterotomy and removal of stone. Would recommend prophylactic cholecystectomy to prevent recurrence.  She would like to hold off until she feels stronger. I think this is acceptable.  No acute issues. No need to stay in-house.    ____________________________ Christopher A. Lundquist, MD cal:ce D: 08/11/2013 15:06:32 ET T: 08/11/2013 15:49:46 ET JOB#: [id number]  cc: Christopher A. Lundquist, MD, <Dictator> CHRISTOPHER A LUNDQUIST MD ELECTRONICALLY SIGNED 08/12/2013 8:39 

## 2014-08-04 NOTE — Consult Note (Signed)
Pt seen and examined. Lovenox ordered for today, but I held for the ERCP. ERCP done. CBD stone present. Extracted after biliary sphincterotomy and balloon sweep. Mild bleeding at sphincterotomy site. Clear liquid diet. Repeat LFT tomorrow. Hold lovenox rest of today and tomorrow. Monter hgb as well. Please DO NOT order lovenox when patient needs ERCP that day. Thanks.  Electronic Signatures: Verdie Shire (MD)  (Signed on 30-Apr-15 14:29)  Authored  Last Updated: 30-Apr-15 14:29 by Verdie Shire (MD)

## 2014-08-04 NOTE — Consult Note (Signed)
Pt had ERCP in 4/15 when CBD stone removed. Had GB surgery yest. IOC showed filling defect. LFT abnormal today but patient without significant symptoms.  Therefore, elected to repeat ERCP this afternoon. Procedure explained to patient and family. ERCP today did a filling defects in CBD. Multiple balloon sweeps were done. Single CBD stone extracted. Pt given prophylactic Abx. Liquid diet ordered. Will recheck LFT in AM. If LFT better, prob stable for discharge tomorrow. Thanks.  Electronic Signatures: Verdie Shire (MD)  (Signed on 02-Jun-15 14:42)  Authored  Last Updated: 02-Jun-15 14:42 by Verdie Shire (MD)

## 2014-08-04 NOTE — Consult Note (Signed)
Brief Consult Note: Diagnosis: Cholelithiasis with finding on abdominal ultrasound of dilatated CBD with CBD stone.  MIld intrahepatic biliary dilatation.  Elevation of LFTs.  History of hypertension.  Recently diagnosed with hyponatremia.   Consult note dictated.   Orders entered.   Discussed with Attending MD.   Comments: Patient's presentation discussed with Dr. Verdie Shire.  Recommendation is to proceed forward with ERCP today.  Order placed, risks versus benefits discussed.  Patient in agreement.  Patient currently receiving antibiotic therapy, Zosyn 3.375 gm IV every 8 hours.  Lovenox was held this am.  NPO status.  Will continue to follow.  Electronic Signatures: Payton Emerald (NP)  (Signed 30-Apr-15 12:23)  Authored: Brief Consult Note   Last Updated: 30-Apr-15 12:23 by Payton Emerald (NP)

## 2014-08-04 NOTE — Consult Note (Signed)
Chief Complaint:  Subjective/Chief Complaint Less abd pain. Tolerating clears. No active bleeding though hgb did fall.   VITAL SIGNS/ANCILLARY NOTES: **Vital Signs.:   01-May-15 04:42  Vital Signs Type Routine  Temperature Temperature (F) 98.9  Celsius 37.1  Temperature Source oral  Pulse Pulse 77  Respirations Respirations 18  Systolic BP Systolic BP 161  Diastolic BP (mmHg) Diastolic BP (mmHg) 69  Mean BP 85  Pulse Ox % Pulse Ox % 92  Pulse Ox Activity Level  At rest  Oxygen Delivery Room Air/ 21 %   Brief Assessment:  GEN no acute distress   Cardiac Regular   Respiratory clear BS   Gastrointestinal Normal   Lab Results:  Hepatic:  01-May-15 04:09   Bilirubin, Total  1.7  Alkaline Phosphatase  196 (45-117 NOTE: New Reference Range 03/03/13)  SGPT (ALT)  413  SGOT (AST)  324  Total Protein, Serum  5.6  Albumin, Serum  2.6  Routine Chem:  01-May-15 04:09   Glucose, Serum 90  BUN  21  Creatinine (comp) 1.01  Sodium, Serum 139  Potassium, Serum 3.6  Chloride, Serum  108  CO2, Serum 25  Calcium (Total), Serum  8.4  Osmolality (calc) 280  eGFR (African American)  58  eGFR (Non-African American)  50 (eGFR values <40m/min/1.73 m2 may be an indication of chronic kidney disease (CKD). Calculated eGFR is useful in patients with stable renal function. The eGFR calculation will not be reliable in acutely ill patients when serum creatinine is changing rapidly. It is not useful in  patients on dialysis. The eGFR calculation may not be applicable to patients at the low and high extremes of body sizes, pregnant women, and vegetarians.)  Anion Gap  6  Routine Hem:  01-May-15 04:09   WBC (CBC) 8.9  RBC (CBC)  3.18  Hemoglobin (CBC)  10.2  Hematocrit (CBC)  29.8  Platelet Count (CBC)  108  MCV 94  MCH 32.0  MCHC 34.1  RDW 13.0  Neutrophil % 89.2  Lymphocyte % 6.5  Monocyte % 4.0  Eosinophil % 0.1  Basophil % 0.2  Neutrophil #  8.0  Lymphocyte #  0.6   Monocyte # 0.4  Eosinophil # 0.0  Basophil # 0.0 (Result(s) reported on 11 Aug 2013 at 04:40AM.)   Assessment/Plan:  Assessment/Plan:  Assessment Gallstones with RUQ pain. Improving after bile duct stone extraction. Some bleeding with sphincterotomy   Plan Advance diet as tolerated. Continue to moniter hgb. Have gen surgery evaluate patient for GB surgery. Will have Dr. WAllen Norrischeck on patient this weekend. Thanks.   Electronic Signatures: OVerdie Shire(MD)  (Signed [phone])  Authored: Chief Complaint, VITAL SIGNS/ANCILLARY NOTES, Brief Assessment, Lab Results, Assessment/Plan   Last Updated: 01-May-15 09:22 by OVerdie Shire(MD)

## 2014-08-04 NOTE — Op Note (Signed)
PATIENT NAMECARRIGAN, [name] MR#:  [id number] DATE OF BIRTH:  1925-01-23  DATE OF PROCEDURE:  09/11/2013  PREOPERATIVE DIAGNOSIS: Previous choledocholithiasis, chronic cholecystitis/cholelithiasis.   POSTOPERATIVE DIAGNOSES:  Previous choledocholithiasis, chronic cholecystitis/cholelithiasis.  CLINICAL NOTE:  This 81 year old woman was hospitalized at the end of April 2015 with cholangitis. ERCP at that time retrieved a stone and post ERCP images showed the common bile duct clear. She was noted to have gallstones on ultrasound and was felt to be a candidate for elective cholecystectomy to minimize the risk of recurrent cholangitis.   OPERATIVE NOTE:  With the patient under adequate general endotracheal anesthesia and having received Zosyn intravenously, which he had tolerated well in the past for prophylaxis, she underwent general endotracheal anesthesia without difficulty. The abdomen was prepped with ChloraPrep and draped. In Trendelenburg position, a Veress needle was placed through a transumbilical incision. After assuring intra-abdominal location with the hanging drop test, a 10 mm step port was expanded and inspection showed no evidence of injury from initial port placement. The patient was placed in reverse Trendelenburg position and rolled to the left. An 11 mm Xcel port was placed in the epigastrium. The left lobe of the liver was flopped over the neck of the gallbladder. As the dissecting forceps was being passed, this pierced the medial aspect of the right lobe of the liver. This resulted in all of the 25 mL of blood loss during the procedure. This was treated with cautery and Surgicel with cessation of bleeding at the end of the procedure. With the left lobe of the liver held cephalad by of the port, two 5 mm step ports were placed laterally. The gallbladder was placed on cephalad traction. The neck of the gallbladder was cleared and a Kumar clamp placed. Fluoroscopic cholangiograms were  completed using 40 mL of one half strength Conray 60. The images showed prompt filling of the duodenum and an initial defect at the cystic duct/common duct junction that moved to the distal common bile duct when the patient was rolled to the right. It did not move proximally when the patient was rolled to the left. There was mild ductal prominence noted, not unexpected 5 weeks post ERCP, but no  inhibition of flow into the duodenum. The cystic duct was doubly clipped and divided. A 2-0 chromic Surg-I-Loop was placed below the clips due to the size of the ductal tissue, although the duct itself was very small. The cystic artery branches were doubly clipped and divided. The gallbladder was removed from the liver bed making use of hook cautery dissection. It was delivered through the umbilical port site without incident. Fine granular stones were noted, all less than a millimeter in diameter. The right upper quadrant was irrigated with lactated Ringer  solution. The bleeding site from the right lobe of the liver was found to have adequate hemostasis. No bleeding noted along the cystic duct or the cystic artery clip sites. The abdomen was then desufflated under direct vision and ports removed. The umbilical and epigastric fascia was approximated with 0 Vicryl sutures. Skin incisions were closed with 4-0 Vicryl subcuticular suture. Benzoin, Steri-Strips, Telfa and Tegaderm dressings were applied.   The patient tolerated the procedure well and was taken recovery room in stable condition.     ____________________________ Robert Bellow, MD jwb:dmm D: 09/11/2013 15:42:00 ET T: 09/11/2013 21:00:32 ET JOB#: [id number]  cc: Robert Bellow, MD, <Dictator> Lupita Dawn. Candace Cruise, MD Jerrell Belfast, MD JEFFREY Amedeo Kinsman MD ELECTRONICALLY SIGNED 09/13/2013  9:26 

## 2014-08-04 NOTE — Discharge Summary (Signed)
PATIENT NAMEMARLEA, [name] MR#:  [id number] DATE OF BIRTH:  1924-08-29  DATE OF ADMISSION:  08/09/2013 DATE OF DISCHARGE:  08/12/2013  PRESENTING COMPLAINT:  Abdominal pain.  DISCHARGE DIAGNOSES: 1.  Acute choledocholithiasis and cholelithiasis status post endoscopic retrograde cholangiopancreatography.  2.  Hypertension.  PROCEDURES: Endoscopic retrograde cholangiopancreatography with stone removal from common bile duct.  CODE STATUS:  FULL CODE.  MEDICATIONS: 1.  Enalapril 5 mg daily. 2.  Trazodone 50 mg at bedtime. 3.  Loratadine 10 mg daily.  DISCHARGE INSTRUCTIONS: 1.  Mechanical soft diet. 2.  Follow up with Dr. Bary Castilla.   3.  Surgery: Call appointment to schedule for your gallbladder removal surgery. 4.  Follow up with Dr. Venia Minks in 1 to 2 weeks. 5.  GI consultation with Dr. Candace Cruise. 6.  Surgical consultation with Dr. Rexene Edison.  Bilirubin total is 0.9. Alk phos is 198.  SGPT is 241.  SGOT is 131.  Albumin is 2.4.  White count is 8.9.    [name] is a pleasant 81 year old Caucasian female with history of hypertension, depression, came into the hospital due to abdominal pain, nausea and vomiting, was found to have abnormal LFTs. She was admitted with: HOSPITAL COURSE: 1.  Abdominal pain, nausea, vomiting, which are suspected due to acute choledocholithiasis and cholelithiasis given her abnormal LFTs.  She underwent ERCP by Dr. Candace Cruise with extraction of stone and sphincterotomy.  Her LFTs improved.  She tolerated the soft diet prior to discharge. The patient had a surgical consultation for gallbladder removal, which was advised to be done as outpatient.  She prefers to follow up with Dr. Bary Castilla.  She was given contact number for Dr. Dwyane Luo office.   2.  Abnormal liver function tests due to choledocholithiasis, improved post endoscopic retrograde cholangiopancreatography. 3.  Hypertension, resumed home meds.    Hospital stay otherwise remained stable.   The patient remained a  FULL CODE.  Time spent:  40 minutes.     ____________________________ Hart Rochester Posey Pronto, MD sap:dmm D: 08/15/2013 14:23:00 ET T: 08/15/2013 23:07:04 ET JOB#: [id number]  cc: Sona A. Posey Pronto, MD, <Dictator> Robert Bellow, MD Jerrell Belfast, MD Wayne Candace Cruise, MD Glena Norfolk Rexene Edison, MD Ilda Basset MD ELECTRONICALLY SIGNED 09/03/2013 15:41

## 2014-09-17 ENCOUNTER — Encounter: Payer: Self-pay | Admitting: Physician Assistant

## 2014-09-17 ENCOUNTER — Ambulatory Visit (INDEPENDENT_AMBULATORY_CARE_PROVIDER_SITE_OTHER): Payer: Commercial Managed Care - HMO | Admitting: Physician Assistant

## 2014-09-17 VITALS — BP 160/78 | HR 72 | Temp 97.6°F | Resp 14 | Wt 125.0 lb

## 2014-09-17 DIAGNOSIS — W57XXXA Bitten or stung by nonvenomous insect and other nonvenomous arthropods, initial encounter: Secondary | ICD-10-CM

## 2014-09-17 DIAGNOSIS — T148 Other injury of unspecified body region: Secondary | ICD-10-CM | POA: Diagnosis not present

## 2014-09-17 MED ORDER — DOXYCYCLINE HYCLATE 100 MG PO TABS: 100.0000 mg | ORAL_TABLET | Freq: Two times a day (BID) | ORAL | Status: DC

## 2014-09-17 NOTE — Patient Instructions (Signed)
Tick Bite Information Ticks are insects that attach themselves to the skin and draw blood for food. There are various types of ticks. Common types include wood ticks and deer ticks. Most ticks live in shrubs and grassy areas. Ticks can climb onto your body when you make contact with leaves or grass where the tick is waiting. The most common places on the body for ticks to attach themselves are the scalp, neck, armpits, waist, and groin. Most tick bites are harmless, but sometimes ticks carry germs that cause diseases. These germs can be spread to a person during the tick's feeding process. The chance of a disease spreading through a tick bite depends on:   The type of tick.  Time of year.   How long the tick is attached.   Geographic location.  HOW CAN YOU PREVENT TICK BITES? Take these steps to help prevent tick bites when you are outdoors:  Wear protective clothing. Long sleeves and long pants are best.   Wear white clothes so you can see ticks more easily.  Tuck your pant legs into your socks.   If walking on a trail, stay in the middle of the trail to avoid brushing against bushes.  Avoid walking through areas with long grass.  Put insect repellent on all exposed skin and along boot tops, pant legs, and sleeve cuffs.   Check clothing, hair, and skin repeatedly and before going inside.   Brush off any ticks that are not attached.  Take a shower or bath as soon as possible after being outdoors.  WHAT IS THE PROPER WAY TO REMOVE A TICK? Ticks should be removed as soon as possible to help prevent diseases caused by tick bites. 1. If latex gloves are available, put them on before trying to remove a tick.  2. Using fine-point tweezers, grasp the tick as close to the skin as possible. You may also use curved forceps or a tick removal tool. Grasp the tick as close to its head as possible. Avoid grasping the tick on its body. 3. Pull gently with steady upward pressure until  the tick lets go. Do not twist the tick or jerk it suddenly. This may break off the tick's head or mouth parts. 4. Do not squeeze or crush the tick's body. This could force disease-carrying fluids from the tick into your body.  5. After the tick is removed, wash the bite area and your hands with soap and water or other disinfectant such as alcohol. 6. Apply a small amount of antiseptic cream or ointment to the bite site.  7. Wash and disinfect any instruments that were used.  Do not try to remove a tick by applying a hot match, petroleum jelly, or fingernail polish to the tick. These methods do not work and may increase the chances of disease being spread from the tick bite.  WHEN SHOULD YOU SEEK MEDICAL CARE? Contact your health care provider if you are unable to remove a tick from your skin or if a part of the tick breaks off and is stuck in the skin.  After a tick bite, you need to be aware of signs and symptoms that could be related to diseases spread by ticks. Contact your health care provider if you develop any of the following in the days or weeks after the tick bite:  Unexplained fever.  Rash. A circular rash that appears days or weeks after the tick bite may indicate the possibility of Lyme disease. The rash may resemble   a target with a bull's-eye and may occur at a different part of your body than the tick bite.  Redness and swelling in the area of the tick bite.   Tender, swollen lymph glands.   Diarrhea.   Weight loss.   Cough.   Fatigue.   Muscle, joint, or bone pain.   Abdominal pain.   Headache.   Lethargy or a change in your level of consciousness.  Difficulty walking or moving your legs.   Numbness in the legs.   Paralysis.  Shortness of breath.   Confusion.   Repeated vomiting.  Document Released: 03/27/2000 Document Revised: 01/18/2013 Document Reviewed: 09/07/2012 [employer] Patient Information 2015 [employer]. This information is  not intended to replace advice given to you by your health care provider. Make sure you discuss any questions you have with your health care provider.  

## 2014-09-17 NOTE — Progress Notes (Signed)
   Subjective:    Patient ID: [name], female    DOB: 1924-09-05, 82 y.o.   MRN: [id number]  HPI Patient is an 81 yr old female that presents to the office today for a tick bite.  States the tick must have got on her on Saturday but she did not notice it until yesterday.  Her son tried to remove the tick but the tick "fell apart" when he tried to pull it off.  She has noticed itching and a red area surrounding where the tick was attached.  No fevers, chills, nausea, vomiting, or muscle aches.   Review of Systems  Constitutional: Negative for fever, chills, appetite change and fatigue.  HENT: Negative for congestion, ear discharge, ear pain, facial swelling, postnasal drip, rhinorrhea, sinus pressure, sneezing, sore throat and trouble swallowing.   Respiratory: Negative for cough, choking, chest tightness and shortness of breath.   Cardiovascular: Negative for chest pain.  Gastrointestinal: Negative for nausea, vomiting, abdominal pain, diarrhea and constipation.  Musculoskeletal: Negative for myalgias, joint swelling, arthralgias, gait problem and neck stiffness.  Skin: Positive for wound (erythematous area surrounding tick bite on right chest wall just below mid clavicle).  Allergic/Immunologic: Negative for environmental allergies.  Neurological: Negative for dizziness, weakness, numbness and headaches.       Objective:   Physical Exam  Constitutional: She appears well-developed and well-nourished. No distress.  Neck: Normal range of motion. Neck supple.  Lymphadenopathy:    She has no cervical adenopathy.  Skin: Skin is warm and dry. There is erythema.             Assessment & Plan:  1. Tick bite Skin was prepped with betadine.  Tweezers and hemostat was used for removal of the tick head without complication.  Advised to wash area with soap and water daily.  May apply dry dressing if drainage occurs.  May apply hydrocortisone cream to area for itching.  Doxycycline  given as below.  She is to call the office if no improvements or symptoms worsen.  - doxycycline (VIBRA-TABS) 100 MG tablet; Take 1 tablet (100 mg total) by mouth 2 (two) times daily.  Dispense: 28 tablet; Refill: 0

## 2014-10-16 DIAGNOSIS — J309 Allergic rhinitis, unspecified: Secondary | ICD-10-CM | POA: Insufficient documentation

## 2014-10-16 DIAGNOSIS — I6782 Cerebral ischemia: Secondary | ICD-10-CM | POA: Insufficient documentation

## 2014-10-16 DIAGNOSIS — G939 Disorder of brain, unspecified: Secondary | ICD-10-CM | POA: Insufficient documentation

## 2014-10-16 DIAGNOSIS — G44009 Cluster headache syndrome, unspecified, not intractable: Secondary | ICD-10-CM | POA: Insufficient documentation

## 2014-10-16 DIAGNOSIS — F329 Major depressive disorder, single episode, unspecified: Secondary | ICD-10-CM | POA: Insufficient documentation

## 2014-10-16 DIAGNOSIS — R29898 Other symptoms and signs involving the musculoskeletal system: Secondary | ICD-10-CM | POA: Insufficient documentation

## 2014-10-16 DIAGNOSIS — F32A Depression, unspecified: Secondary | ICD-10-CM | POA: Insufficient documentation

## 2014-10-16 DIAGNOSIS — K862 Cyst of pancreas: Secondary | ICD-10-CM | POA: Insufficient documentation

## 2014-10-16 DIAGNOSIS — J45909 Unspecified asthma, uncomplicated: Secondary | ICD-10-CM | POA: Insufficient documentation

## 2014-10-16 DIAGNOSIS — Z8719 Personal history of other diseases of the digestive system: Secondary | ICD-10-CM | POA: Insufficient documentation

## 2014-10-16 DIAGNOSIS — E871 Hypo-osmolality and hyponatremia: Secondary | ICD-10-CM | POA: Insufficient documentation

## 2014-10-16 DIAGNOSIS — R413 Other amnesia: Secondary | ICD-10-CM | POA: Insufficient documentation

## 2014-10-16 DIAGNOSIS — M810 Age-related osteoporosis without current pathological fracture: Secondary | ICD-10-CM | POA: Insufficient documentation

## 2014-10-16 DIAGNOSIS — K8050 Calculus of bile duct without cholangitis or cholecystitis without obstruction: Secondary | ICD-10-CM | POA: Insufficient documentation

## 2014-10-16 DIAGNOSIS — G4700 Insomnia, unspecified: Secondary | ICD-10-CM | POA: Insufficient documentation

## 2014-10-16 DIAGNOSIS — I10 Essential (primary) hypertension: Secondary | ICD-10-CM | POA: Insufficient documentation

## 2014-10-16 DIAGNOSIS — M958 Other specified acquired deformities of musculoskeletal system: Secondary | ICD-10-CM | POA: Insufficient documentation

## 2014-10-16 DIAGNOSIS — M8000XD Age-related osteoporosis with current pathological fracture, unspecified site, subsequent encounter for fracture with routine healing: Secondary | ICD-10-CM | POA: Insufficient documentation

## 2014-10-16 HISTORY — DX: Hypo-osmolality and hyponatremia: E87.1

## 2014-10-16 HISTORY — DX: Other amnesia: R41.3

## 2014-10-17 ENCOUNTER — Encounter: Payer: Self-pay | Admitting: Family Medicine

## 2014-10-17 ENCOUNTER — Ambulatory Visit (INDEPENDENT_AMBULATORY_CARE_PROVIDER_SITE_OTHER): Payer: Commercial Managed Care - HMO | Admitting: Family Medicine

## 2014-10-17 VITALS — BP 122/74 | HR 68 | Temp 97.7°F | Resp 16 | Wt 122.0 lb

## 2014-10-17 DIAGNOSIS — G4700 Insomnia, unspecified: Secondary | ICD-10-CM

## 2014-10-17 DIAGNOSIS — F419 Anxiety disorder, unspecified: Secondary | ICD-10-CM | POA: Insufficient documentation

## 2014-10-17 MED ORDER — ESCITALOPRAM OXALATE 10 MG PO TABS: 10.0000 mg | ORAL_TABLET | Freq: Every day | ORAL | Status: DC

## 2014-10-17 NOTE — Progress Notes (Signed)
Subjective:    Patient ID: [name], female    DOB: [birth year], 81 y.o.   MRN: [id number]  Anxiety Presents for follow-up visit. The problem has been gradually worsening. Symptoms include depressed mood, excessive worry, insomnia, malaise, nervous/anxious behavior and palpitations (Only happened one time.). Patient reports no chest pain, confusion, decreased concentration, dizziness, dry mouth, nausea, panic, shortness of breath or suicidal ideas. Symptoms occur constantly. The severity of symptoms is causing significant distress. The symptoms are aggravated by family issues. The quality of sleep is poor. Nighttime awakenings: early a.m. for rest of night.   Past treatments include SSRIs. The treatment provided significant relief. Compliance with prior treatments has been good.   Patient Active Problem List   Diagnosis Date Noted  . Anxiety 10/17/2014  . Allergic rhinitis 10/16/2014  . Airway hyperreactivity 10/16/2014  . Biliary calculi, common bile duct 10/16/2014  . Clinical depression 10/16/2014  . 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 Clicking shoulder [id number]  . Essential (primary) hypertension 10/16/2014  . H/O gastrointestinal hemorrhage 10/16/2014  . 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 Below normal amount of sodium in the blood 10/16/2014  . Cannot sleep 10/16/2014  . Bad memory 10/16/2014  . OP (osteoporosis) 10/16/2014  . Cyst of pancreas 10/16/2014  . Temporary cerebral vascular dysfunction 10/16/2014  . Other constipation 03/07/2014  . Right groin pain 03/07/2014  . Internal hemorrhoid 03/07/2014  . Gallstones, common bile duct 08/29/2013  . Cholelithiasis 08/29/2013  . CYST AND PSEUDOCYST OF PANCREAS 03/12/2008   History   Social History  . Marital Status: Widowed    Spouse Name: N/A  . Number of Children: N/A  . Years of Education: N/A   Occupational History  . Not on file.   Social History Main Topics  . Smoking status: Never Smoker   .  Smokeless tobacco: Never Used  . Alcohol Use: No  . Drug Use: No  . Sexual Activity: Not on file   Other Topics Concern  . Not on file   Social History Narrative   Allergies  Allergen Reactions  . Accolate  [Zafirlukast]   . Metronidazole   . Nitrofurantoin Monohyd Macro     GI upset  . Penicillins Diarrhea  . Keflex [Cephalexin] Rash   Previous Medications   ENALAPRIL (VASOTEC) 10 MG TABLET    Take 10 mg by mouth daily.    ESCITALOPRAM (LEXAPRO) 20 MG TABLET    Take by mouth.   LORATADINE (ALLERGY RELIEF) 10 MG TABLET    Take 10 mg by mouth daily as needed for allergies.   TRAZODONE (DESYREL) 50 MG TABLET    Take 50 mg by mouth at bedtime.    BP 122/74 mmHg  Pulse 68  Temp(Src) 97.7 F (36.5 C) (Oral)  Resp 16  Wt 122 lb (55.339 kg)     Review of Systems  Constitutional: Positive for fatigue. Negative for fever, chills, diaphoresis, activity change, appetite change and unexpected weight change.  Respiratory: Negative for apnea, cough, choking, chest tightness, shortness of breath, wheezing and stridor.   Cardiovascular: Positive for palpitations (Only happened one time.). Negative for chest pain and leg swelling.  Gastrointestinal: Negative for nausea, vomiting, abdominal pain, diarrhea, constipation, blood in stool, abdominal distention, anal bleeding and rectal pain.  Neurological: Positive for headaches. Negative for dizziness, tremors, seizures, syncope, facial asymmetry, speech difficulty, weakness, light-headedness and numbness.  Psychiatric/Behavioral: Positive for sleep disturbance and dysphoric mood. Negative for suicidal ideas, hallucinations, behavioral problems, confusion, self-injury, decreased concentration and  agitation. The patient is nervous/anxious and has insomnia. The patient is not hyperactive.        Objective:   Physical Exam  Constitutional: She is oriented to person, place, and time. She appears well-developed and well-nourished.   Neurological: She is alert and oriented to person, place, and time.  Psychiatric: She has a normal mood and affect. Her behavior is normal. Judgment and thought content normal.   BP 122/74 mmHg  Pulse 68  Temp(Src) 97.7 F (36.5 C) (Oral)  Resp 16  Wt 122 lb (55.339 kg)        Assessment & Plan:   1. Cannot sleep Will try medication as below.   2. Anxiety Will restart medication and recheck in 4 weeks.  Please call back if condition worsens or does not continue to improve.    - escitalopram (LEXAPRO) 10 MG tablet; Take 1 tablet (10 mg total) by mouth daily. 1/2 daily for 7 days and then 1 a day  Dispense: 30 tablet; Refill: 5  Margarita Rana, MD

## 2014-11-14 ENCOUNTER — Encounter: Payer: Self-pay | Admitting: Family Medicine

## 2014-11-14 ENCOUNTER — Ambulatory Visit (INDEPENDENT_AMBULATORY_CARE_PROVIDER_SITE_OTHER): Payer: Commercial Managed Care - HMO | Admitting: Family Medicine

## 2014-11-14 VITALS — BP 120/72 | HR 80 | Temp 97.7°F | Resp 16 | Wt 125.0 lb

## 2014-11-14 DIAGNOSIS — F419 Anxiety disorder, unspecified: Secondary | ICD-10-CM

## 2014-11-14 DIAGNOSIS — G4700 Insomnia, unspecified: Secondary | ICD-10-CM | POA: Diagnosis not present

## 2014-11-14 DIAGNOSIS — I10 Essential (primary) hypertension: Secondary | ICD-10-CM

## 2014-11-14 NOTE — Progress Notes (Signed)
Subjective:    Patient ID: [name], female    DOB: [birth year], 81 y.o.   MRN: [id number]  Anxiety Presents for follow-up (Pt was seen on 10/17/2014 and was started on Lexapro 10 mg) visit. Symptoms include depressed mood, dry mouth, excessive worry, insomnia (improved) and irritability. Patient reports no chest pain, compulsions, confusion, decreased concentration, dizziness, feeling of choking, hyperventilation, malaise, muscle tension, nausea, nervous/anxious behavior, palpitations, panic or suicidal ideas. The symptoms are aggravated by family issues. The quality of sleep is good.   Past treatments include SSRIs. The treatment provided no relief. Compliance with prior treatments has been poor. Side effects of treatment include GI discomfort (malaise, weakness, insomnia).  Really did not tolerate medication. Bowels are finally getting regular.  Insomnia Pt was started on Lexapro at [place] for anxiety. Pt had to D/C this medication due to side effects. Pt reports this problem is improving. Pt is c/o frequent nighttime awakenings, but otherwise sleeps well.    Review of Systems  Constitutional: Positive for irritability.  Cardiovascular: Negative for chest pain and palpitations.  Gastrointestinal: Negative for nausea.  Genitourinary: Positive for frequency (nocturia (pt could not give urine sample)).  Neurological: Negative for dizziness.  Psychiatric/Behavioral: Negative for suicidal ideas, confusion and decreased concentration. The patient has insomnia (improved). The patient is not nervous/anxious.    BP 120/72 mmHg  Pulse 80  Temp(Src) 97.7 F (36.5 C) (Oral)  Resp 16  Wt 125 lb (56.7 kg)   Patient Active Problem List   Diagnosis Date Noted  . Anxiety 10/17/2014  . Allergic rhinitis 10/16/2014  . Airway hyperreactivity 10/16/2014  . Biliary calculi, common bile duct 10/16/2014  . Clinical depression 10/16/2014  . 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 Clicking  shoulder [id number]  . Essential (primary) hypertension 10/16/2014  . H/O gastrointestinal hemorrhage 10/16/2014  . 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 Below normal amount of sodium in the blood 10/16/2014  . Cannot sleep 10/16/2014  . Bad memory 10/16/2014  . OP (osteoporosis) 10/16/2014  . Cyst of pancreas 10/16/2014  . Temporary cerebral vascular dysfunction 10/16/2014  . Other constipation 03/07/2014  . Right groin pain 03/07/2014  . Internal hemorrhoid 03/07/2014  . Gallstones, common bile duct 08/29/2013  . Cholelithiasis 08/29/2013  . CYST AND PSEUDOCYST OF PANCREAS 03/12/2008   Past Medical History  Diagnosis Date  . Hypertension   . Diverticulitis   . Gallstones   . Breast cyst     removed  . Low sodium levels    Current Outpatient Prescriptions on File Prior to Visit  Medication Sig  . enalapril (VASOTEC) 10 MG tablet Take 10 mg by mouth daily.   Marland Kitchen loratadine (ALLERGY RELIEF) 10 MG tablet Take 10 mg by mouth daily as needed for allergies.  . traZODone (DESYREL) 50 MG tablet Take 50 mg by mouth at bedtime.    No current facility-administered medications on file prior to visit.   Allergies  Allergen Reactions  . Accolate  [Zafirlukast]   . Metronidazole   . Nitrofurantoin Monohyd Macro     GI upset  . Penicillins Diarrhea  . Keflex [Cephalexin] Rash   Past Surgical History  Procedure Laterality Date  . Bladder tack  1990's  . Appendectomy  1940's  . Abdominal hysterectomy  2000  . Breast cyst incision and drainage Left 1990's  . Ercp w/ sphicterotomy  09/12/2013    Recurrent stone identified post cholecystectomy.  . Cholecystectomy  09/11/13    Cholangiogram suggested a retained stone ductal dilatation.  Marland Kitchen  Nasal sinus surgery     History   Social History  . Marital Status: Widowed    Spouse Name: N/A  . Number of Children: N/A  . Years of Education: N/A   Occupational History  . Not on file.   Social History Main Topics  . Smoking status: Never  Smoker   . Smokeless tobacco: Never Used  . Alcohol Use: No  . Drug Use: No  . Sexual Activity: Not on file   Other Topics Concern  . Not on file   Social History Narrative   Family History  Problem Relation Age of Onset  . Hypertension Father   . CAD Father   . Cancer Brother     unknown type       Objective:   Physical Exam  Constitutional: She is oriented to person, place, and time. She appears well-developed and well-nourished.  Neurological: She is alert and oriented to person, place, and time.  Psychiatric: She has a normal mood and affect. Her behavior is normal. Thought content normal.   BP 120/72 mmHg  Pulse 80  Temp(Src) 97.7 F (36.5 C) (Oral)  Resp 16  Wt 125 lb (56.7 kg)      Assessment & Plan:  1. Essential (primary) hypertension Stable. Continue current medication.    2. Anxiety Not as bad as previous. Still has some, but wants to keep medication as is for now.    3. Cannot sleep Stable on Trazodone.  Will not adjust dose. Was having trouble with constipation, but sleeping better now hat that has straightened out.    Margarita Rana, MD

## 2014-12-31 ENCOUNTER — Telehealth: Payer: Self-pay | Admitting: Family Medicine

## 2014-12-31 DIAGNOSIS — M79673 Pain in unspecified foot: Secondary | ICD-10-CM

## 2014-12-31 HISTORY — DX: Pain in unspecified foot: M79.673

## 2014-12-31 NOTE — Telephone Encounter (Signed)
Pt is requesting referral to see Dr Cleda Mccreedy for  corn on big toe,left foot causing pain.She states that she spoke to you about this during office visit

## 2015-03-13 ENCOUNTER — Encounter: Payer: Self-pay | Admitting: Family Medicine

## 2015-03-13 ENCOUNTER — Ambulatory Visit (INDEPENDENT_AMBULATORY_CARE_PROVIDER_SITE_OTHER): Payer: Commercial Managed Care - HMO | Admitting: Family Medicine

## 2015-03-13 VITALS — BP 136/90 | HR 68 | Temp 97.7°F | Resp 16 | Wt 121.0 lb

## 2015-03-13 DIAGNOSIS — Z0000 Encounter for general adult medical examination without abnormal findings: Secondary | ICD-10-CM | POA: Diagnosis not present

## 2015-03-13 DIAGNOSIS — G4700 Insomnia, unspecified: Secondary | ICD-10-CM | POA: Diagnosis not present

## 2015-03-13 DIAGNOSIS — Z23 Encounter for immunization: Secondary | ICD-10-CM | POA: Diagnosis not present

## 2015-03-13 MED ORDER — TRAZODONE HCL 100 MG PO TABS: 100.0000 mg | ORAL_TABLET | Freq: Every day | ORAL | Status: DC

## 2015-03-13 NOTE — Progress Notes (Signed)
Patient ID: [name], female   DOB: 1924/04/21, 81 y.o.   MRN: [id number]         Patient: [name], Female    DOB: 11/12/1924, 81 y.o.   MRN: [id number] Visit Date: 03/13/2015  Today's Provider: Margarita Rana, MD   Chief Complaint  Patient presents with  . Annual Exam   Subjective:    Annual wellness visit [name] is a 81 y.o. female. She feels fairly well. She reports not exercising formal, but stays very active. She reports she is sleeping poorly.  She thinks it is time to increase her Trazodone.    -----------------------------------------------------------   Review of Systems  Constitutional: Positive for chills and appetite change. Negative for fever, diaphoresis, activity change, fatigue and unexpected weight change.  HENT: Positive for sneezing. Negative for congestion, dental problem, drooling, ear discharge, ear pain, facial swelling, hearing loss, mouth sores, nosebleeds, postnasal drip, rhinorrhea, sinus pressure, sore throat, tinnitus, trouble swallowing and voice change.   Eyes: Negative.   Respiratory: Positive for apnea and cough. Negative for choking, chest tightness, shortness of breath, wheezing and stridor.   Cardiovascular: Positive for leg swelling. Negative for chest pain and palpitations.  Gastrointestinal: Positive for constipation. Negative for nausea, vomiting, abdominal pain, diarrhea, blood in stool, abdominal distention, anal bleeding and rectal pain.  Endocrine: Positive for cold intolerance and polyuria. Negative for heat intolerance, polydipsia and polyphagia.  Genitourinary: Negative.   Musculoskeletal: Positive for arthralgias. Negative for myalgias, back pain, joint swelling, gait problem, neck pain and neck stiffness.  Skin: Negative for color change, pallor, rash and wound.  Allergic/Immunologic: Positive for environmental allergies. Negative for food allergies and immunocompromised state.  Neurological: Positive for  headaches. Negative for dizziness, tremors, seizures, syncope, facial asymmetry, speech difficulty, weakness, light-headedness and numbness.  Hematological: Negative.  Negative for adenopathy. Does not bruise/bleed easily.  Psychiatric/Behavioral: Positive for sleep disturbance. Negative for suicidal ideas, hallucinations, behavioral problems, confusion, self-injury, dysphoric mood, decreased concentration and agitation. The patient is nervous/anxious. The patient is not hyperactive.     Social History   Social History  . Marital Status: Widowed    Spouse Name: N/A  . Number of Children: N/A  . Years of Education: N/A   Occupational History  . Not on file.   Social History Main Topics  . Smoking status: Never Smoker   . Smokeless tobacco: Never Used  . Alcohol Use: No  . Drug Use: No  . Sexual Activity: Not on file   Other Topics Concern  . Not on file   Social History Narrative    Patient Active Problem List   Diagnosis Date Noted  . Need for vaccination with 13-polyvalent pneumococcal conjugate vaccine 03/13/2015  . Foot pain 12/31/2014  . Anxiety 10/17/2014  . Allergic rhinitis 10/16/2014  . Airway hyperreactivity 10/16/2014  . Biliary calculi, common bile duct 10/16/2014  . Clinical depression 10/16/2014  . 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 Clicking shoulder [id number]  . Essential (primary) hypertension 10/16/2014  . H/O gastrointestinal hemorrhage 10/16/2014  . 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 Below normal amount of sodium in the blood 10/16/2014  . Cannot sleep 10/16/2014  . Bad memory 10/16/2014  . OP (osteoporosis) 10/16/2014  . Cyst of pancreas 10/16/2014  . Temporary cerebral vascular dysfunction 10/16/2014  . Other constipation 03/07/2014  . Right groin pain 03/07/2014  . Internal hemorrhoid 03/07/2014  . Gallstones, common bile duct 08/29/2013  . Cholelithiasis 08/29/2013  . CYST AND PSEUDOCYST OF PANCREAS 03/12/2008  Past Surgical  History  Procedure Laterality Date  . Bladder tack  1990's  . Appendectomy  1940's  . Abdominal hysterectomy  2000  . Breast cyst incision and drainage Left 1990's  . Ercp w/ sphicterotomy  09/12/2013    Recurrent stone identified post cholecystectomy.  . Cholecystectomy  09/11/13    Cholangiogram suggested a retained stone ductal dilatation.  . Nasal sinus surgery      Her family history includes CAD in her father; Cancer in her brother; Hypertension in her father.    Previous Medications   ACETAMINOPHEN (TYLENOL) 500 MG TABLET    Take 1,000 mg by mouth at bedtime.   ENALAPRIL (VASOTEC) 10 MG TABLET    Take 10 mg by mouth daily.    LORATADINE (ALLERGY RELIEF) 10 MG TABLET    Take 10 mg by mouth daily as needed for allergies.   TRAZODONE (DESYREL) 50 MG TABLET    Take 50 mg by mouth at bedtime.     Patient Care Team: Margarita Rana, MD as PCP - General (Family Medicine) Hulen Luster, MD as Physician Assistant (Internal Medicine) Robert Bellow, MD (General Surgery) Gae Dry, MD as Referring Physician (Obstetrics and Gynecology)     Objective:   Vitals: BP 136/90 mmHg  Pulse 68  Temp(Src) 97.7 F (36.5 C) (Oral)  Resp 16  Wt 121 lb (54.885 kg)  Physical Exam  Constitutional: She appears well-developed and well-nourished.  HENT:  Head: Normocephalic and atraumatic.  Right Ear: Tympanic membrane, external ear and ear canal normal.  Left Ear: Tympanic membrane, external ear and ear canal normal.  Nose: Nose normal.  Mouth/Throat: Uvula is midline, oropharynx is clear and moist and mucous membranes are normal.  Eyes: Lids are normal. Lids are everted and swept, no foreign bodies found.  Neck: Trachea normal and normal range of motion. Carotid bruit is not present.  Cardiovascular: Normal rate and regular rhythm.   Pulmonary/Chest: Effort normal and breath sounds normal.  Abdominal: There is no tenderness.  Musculoskeletal: Normal range of motion.  Psychiatric: She  has a normal mood and affect. Her speech is normal and behavior is normal. Thought content normal. Cognition and memory are normal.    Activities of Daily Living In your present state of health, do you have any difficulty performing the following activities: 03/13/2015  Hearing? N  Vision? N  Difficulty concentrating or making decisions? N  Walking or climbing stairs? Y  Dressing or bathing? N  Doing errands, shopping? Y    Fall Risk Assessment Fall Risk  03/13/2015  Falls in the past year? No     Depression Screen PHQ 2/9 Scores 03/13/2015  PHQ - 2 Score 1    Cognitive Testing - 6-CIT  Correct? Score   What year is it? yes 0 0 or 4  What month is it? yes 0 0 or 3  Memorize:    Pia Mau,  44,  [place],      What time is it? (within 1 hour) yes 0 0 or 3  Count backwards from 20 yes 0 0, 2, or 4  Name the months of the year yes 0 0, 2, or 4  Repeat name & address above yes 0 0, 2, 4, 6, 8, or 10       TOTAL SCORE  0/28   Interpretation:  Normal  Normal (0-7) Abnormal (8-28)       Assessment & Plan:     Annual Wellness  Visit  Reviewed patient's Family Medical History Reviewed and updated list of patient's medical providers Assessment of cognitive impairment was done Assessed patient's functional ability Established a written schedule for health screening Central Completed and Reviewed  Exercise Activities and Dietary recommendations Goals    None      Immunization History  Administered Date(s) Administered  . Influenza-Unspecified 12/12/2013  . Pneumococcal Polysaccharide-23 04/09/2006  . Zoster 04/23/2006    Health Maintenance  Topic Date Due  . Samul Dada  02/03/1946  . PNA vac Low Risk Adult (2 of 2 - PCV13) 04/10/2007  . INFLUENZA VACCINE  03/12/2016 (Originally 11/12/2014)  . DEXA SCAN  Completed  . ZOSTAVAX  Completed      Discussed health benefits of physical activity, and encouraged her to engage in  regular exercise appropriate for her age and condition.   1. Medicare annual wellness visit, subsequent As above.   2. Need for vaccination with 13-polyvalent pneumococcal conjugate vaccine Given today.  - Pneumococcal conjugate vaccine 13-valent  3. Insomnia Condition is worsening. Will increase medication for better control.   - traZODone (DESYREL) 100 MG tablet; Take 1 tablet (100 mg total) by mouth at bedtime. (Dose Change.)  Dispense: 90 tablet; Refill: 1    Patient was seen and examined by Jerrell Belfast, MD, and note scribed by Ashley Royalty, CMA.   I have reviewed the document for accuracy and completeness and I agree with above. Jerrell Belfast, MD   Margarita Rana, MD    ------------------------------------------------------------------------------------------------------------

## 2015-04-25 ENCOUNTER — Other Ambulatory Visit: Payer: Self-pay

## 2015-04-25 DIAGNOSIS — I10 Essential (primary) hypertension: Secondary | ICD-10-CM

## 2015-04-25 MED ORDER — ENALAPRIL MALEATE 10 MG PO TABS: 10.0000 mg | ORAL_TABLET | Freq: Every day | ORAL | Status: DC

## 2015-04-25 NOTE — Telephone Encounter (Signed)
Refill request received from [place] requesting Enalapril 10 mg.

## 2015-07-21 ENCOUNTER — Observation Stay
Admission: EM | Admit: 2015-07-21 | Discharge: 2015-07-23 | Disposition: A | Discharge status: Home / Self Care | Payer: PPO | Attending: Internal Medicine | Admitting: Internal Medicine

## 2015-07-21 ENCOUNTER — Emergency Department: Payer: PPO

## 2015-07-21 ENCOUNTER — Encounter: Payer: Self-pay | Admitting: *Deleted

## 2015-07-21 DIAGNOSIS — R55 Syncope and collapse: Secondary | ICD-10-CM | POA: Diagnosis not present

## 2015-07-21 DIAGNOSIS — K5900 Constipation, unspecified: Secondary | ICD-10-CM | POA: Insufficient documentation

## 2015-07-21 DIAGNOSIS — K862 Cyst of pancreas: Secondary | ICD-10-CM | POA: Diagnosis not present

## 2015-07-21 DIAGNOSIS — Y92009 Unspecified place in unspecified non-institutional (private) residence as the place of occurrence of the external cause: Secondary | ICD-10-CM | POA: Diagnosis not present

## 2015-07-21 DIAGNOSIS — Z880 Allergy status to penicillin: Secondary | ICD-10-CM | POA: Diagnosis not present

## 2015-07-21 DIAGNOSIS — I10 Essential (primary) hypertension: Secondary | ICD-10-CM

## 2015-07-21 DIAGNOSIS — N3000 Acute cystitis without hematuria: Secondary | ICD-10-CM | POA: Insufficient documentation

## 2015-07-21 DIAGNOSIS — J309 Allergic rhinitis, unspecified: Secondary | ICD-10-CM | POA: Insufficient documentation

## 2015-07-21 DIAGNOSIS — W1830XA Fall on same level, unspecified, initial encounter: Secondary | ICD-10-CM | POA: Insufficient documentation

## 2015-07-21 DIAGNOSIS — S0990XA Unspecified injury of head, initial encounter: Secondary | ICD-10-CM

## 2015-07-21 DIAGNOSIS — S0003XA Contusion of scalp, initial encounter: Secondary | ICD-10-CM | POA: Insufficient documentation

## 2015-07-21 DIAGNOSIS — Z809 Family history of malignant neoplasm, unspecified: Secondary | ICD-10-CM | POA: Insufficient documentation

## 2015-07-21 DIAGNOSIS — Z888 Allergy status to other drugs, medicaments and biological substances status: Secondary | ICD-10-CM | POA: Insufficient documentation

## 2015-07-21 DIAGNOSIS — Z90710 Acquired absence of both cervix and uterus: Secondary | ICD-10-CM | POA: Diagnosis not present

## 2015-07-21 DIAGNOSIS — Z881 Allergy status to other antibiotic agents status: Secondary | ICD-10-CM | POA: Insufficient documentation

## 2015-07-21 DIAGNOSIS — Z9049 Acquired absence of other specified parts of digestive tract: Secondary | ICD-10-CM | POA: Diagnosis not present

## 2015-07-21 DIAGNOSIS — F419 Anxiety disorder, unspecified: Secondary | ICD-10-CM | POA: Insufficient documentation

## 2015-07-21 DIAGNOSIS — R42 Dizziness and giddiness: Secondary | ICD-10-CM

## 2015-07-21 DIAGNOSIS — K863 Pseudocyst of pancreas: Secondary | ICD-10-CM | POA: Diagnosis not present

## 2015-07-21 DIAGNOSIS — F329 Major depressive disorder, single episode, unspecified: Secondary | ICD-10-CM | POA: Insufficient documentation

## 2015-07-21 DIAGNOSIS — Z79899 Other long term (current) drug therapy: Secondary | ICD-10-CM | POA: Diagnosis not present

## 2015-07-21 DIAGNOSIS — R1031 Right lower quadrant pain: Secondary | ICD-10-CM | POA: Insufficient documentation

## 2015-07-21 DIAGNOSIS — G4700 Insomnia, unspecified: Secondary | ICD-10-CM | POA: Diagnosis not present

## 2015-07-21 DIAGNOSIS — Y93E9 Activity, other interior property and clothing maintenance: Secondary | ICD-10-CM | POA: Insufficient documentation

## 2015-07-21 DIAGNOSIS — Z8249 Family history of ischemic heart disease and other diseases of the circulatory system: Secondary | ICD-10-CM | POA: Diagnosis not present

## 2015-07-21 DIAGNOSIS — I491 Atrial premature depolarization: Secondary | ICD-10-CM | POA: Insufficient documentation

## 2015-07-21 DIAGNOSIS — S0101XA Laceration without foreign body of scalp, initial encounter: Secondary | ICD-10-CM | POA: Diagnosis not present

## 2015-07-21 DIAGNOSIS — W19XXXA Unspecified fall, initial encounter: Secondary | ICD-10-CM

## 2015-07-21 HISTORY — DX: Syncope and collapse: R55

## 2015-07-21 LAB — URINALYSIS COMPLETE WITH MICROSCOPIC (ARMC ONLY)
Bilirubin Urine: NEGATIVE
Glucose, UA: NEGATIVE mg/dL
Hgb urine dipstick: NEGATIVE
Ketones, ur: NEGATIVE mg/dL
Nitrite: POSITIVE — AB
Protein, ur: NEGATIVE mg/dL
Renal Epithelial: <1
Specific Gravity, Urine: 1.01 (ref 1.005–1.030)
pH: 7 (ref 5.0–8.0)

## 2015-07-21 LAB — CBC
HCT: 35.4 % (ref 35.0–47.0)
Hemoglobin: 12.3 g/dL (ref 12.0–16.0)
MCH: 32.9 pg (ref 26.0–34.0)
MCHC: 34.7 g/dL (ref 32.0–36.0)
MCV: 94.9 fL (ref 80.0–100.0)
Platelets: 141 10*3/uL — ABNORMAL LOW (ref 150–440)
RBC: 3.73 MIL/uL — ABNORMAL LOW (ref 3.80–5.20)
RDW: 12.9 % (ref 11.5–14.5)
WBC: 5 10*3/uL (ref 3.6–11.0)

## 2015-07-21 LAB — BASIC METABOLIC PANEL
Anion gap: 4 — ABNORMAL LOW (ref 5–15)
BUN: 28 mg/dL — ABNORMAL HIGH (ref 6–20)
CO2: 27 mmol/L (ref 22–32)
Calcium: 9.9 mg/dL (ref 8.9–10.3)
Chloride: 107 mmol/L (ref 101–111)
Creatinine, Ser: 0.93 mg/dL (ref 0.44–1.00)
GFR calc Af Amer: >60 mL/min (ref >60)
GFR calc non Af Amer: 53 mL/min — ABNORMAL LOW (ref >60)
Glucose, Bld: 114 mg/dL — ABNORMAL HIGH (ref 65–99)
Potassium: 3.9 mmol/L (ref 3.5–5.1)
Sodium: 138 mmol/L (ref 135–145)

## 2015-07-21 LAB — TROPONIN I: Troponin I: <0.03 ng/mL (ref <0.031)

## 2015-07-21 MED ORDER — ENOXAPARIN SODIUM 40 MG/0.4ML ~~LOC~~ SOLN
40.0000 mg | SUBCUTANEOUS | Status: DC
  Administered 2015-07-21 – 2015-07-22 (×2): 40 mg via SUBCUTANEOUS
  Filled 2015-07-21 (×2): qty 0.4

## 2015-07-21 MED ORDER — LIDOCAINE-EPINEPHRINE-TETRACAINE (LET) SOLUTION
3.0000 mL | Freq: Once | NASAL | Status: AC
  Administered 2015-07-21: 3 mL via TOPICAL
  Filled 2015-07-21: qty 3

## 2015-07-21 MED ORDER — ONDANSETRON HCL 4 MG PO TABS
4.0000 mg | ORAL_TABLET | Freq: Four times a day (QID) | ORAL | Status: DC | PRN
  Filled 2015-07-21: qty 1

## 2015-07-21 MED ORDER — ACETAMINOPHEN 325 MG PO TABS
650.0000 mg | ORAL_TABLET | Freq: Four times a day (QID) | ORAL | Status: DC | PRN
  Administered 2015-07-21 – 2015-07-22 (×2): 650 mg via ORAL
  Filled 2015-07-21 (×2): qty 2

## 2015-07-21 MED ORDER — HYDRALAZINE HCL 20 MG/ML IJ SOLN
10.0000 mg | INTRAMUSCULAR | Status: DC | PRN
  Administered 2015-07-21: 10 mg via INTRAVENOUS

## 2015-07-21 MED ORDER — HYDRALAZINE HCL 20 MG/ML IJ SOLN
INTRAMUSCULAR | Status: AC
  Administered 2015-07-21: 10 mg via INTRAVENOUS
  Filled 2015-07-21: qty 1

## 2015-07-21 MED ORDER — ONDANSETRON HCL 4 MG/2ML IJ SOLN: 4.0000 mg | Freq: Four times a day (QID) | INTRAMUSCULAR | Status: DC | PRN

## 2015-07-21 MED ORDER — SODIUM CHLORIDE 0.9 % IV SOLN
INTRAVENOUS | Status: DC
  Administered 2015-07-21 – 2015-07-23 (×3): via INTRAVENOUS

## 2015-07-21 MED ORDER — LIDOCAINE-EPINEPHRINE-TETRACAINE (LET) SOLUTION
NASAL | Status: AC
  Administered 2015-07-21: 3 mL via TOPICAL
  Filled 2015-07-21: qty 3

## 2015-07-21 MED ORDER — ACETAMINOPHEN 650 MG RE SUPP
650.0000 mg | Freq: Four times a day (QID) | RECTAL | Status: DC | PRN
  Filled 2015-07-21: qty 1

## 2015-07-21 MED ORDER — OXYCODONE HCL 5 MG PO TABS: 5.0000 mg | ORAL_TABLET | ORAL | Status: DC | PRN

## 2015-07-21 MED ORDER — MORPHINE SULFATE (PF) 2 MG/ML IV SOLN: 2.0000 mg | INTRAVENOUS | Status: DC | PRN

## 2015-07-21 MED ORDER — SODIUM CHLORIDE 0.9% FLUSH
3.0000 mL | Freq: Two times a day (BID) | INTRAVENOUS | Status: DC
  Administered 2015-07-21 – 2015-07-22 (×3): 3 mL via INTRAVENOUS

## 2015-07-21 NOTE — H&P (Signed)
[facility] [place] NAME: [name]    MR#:  [id number]  DATE OF BIRTH:  Nov 28, 1924   DATE OF ADMISSION:  07/21/2015  PRIMARY CARE PHYSICIAN: Margarita Rana, MD   REQUESTING/REFERRING PHYSICIAN: Jimmye Norman  CHIEF COMPLAINT:   Chief Complaint  Patient presents with  . Fall  . Dizziness  . Head Laceration    HISTORY OF PRESENT ILLNESS:  [name]  is a 82 y.o. female with a known history of essential hypertension presenting after near syncopal episode. She describes overexerting herself yesterday doing yard work. Today when she is attempting to clean her house going from the sitting to standing position she felt lightheaded so suddenly fell hitting her head on the closet door. Did suffer head trauma but no loss of consciousness since Hospital further workup and evaluation. Staples placed for laceration by emergency department staff. Patient has no further complaints at this time  PAST MEDICAL HISTORY:   Past Medical History  Diagnosis Date  . Hypertension   . Diverticulitis   . Gallstones   . Breast cyst     removed  . Low sodium levels     PAST SURGICAL HISTORY:   Past Surgical History  Procedure Laterality Date  . Bladder tack  1990's  . Appendectomy  1940's  . Abdominal hysterectomy  2000  . Breast cyst incision and drainage Left 1990's  . Ercp w/ sphicterotomy  09/12/2013    Recurrent stone identified post cholecystectomy.  . Cholecystectomy  09/11/13    Cholangiogram suggested a retained stone ductal dilatation.  . Nasal sinus surgery      SOCIAL HISTORY:   Social History  Substance Use Topics  . Smoking status: Never Smoker   . Smokeless tobacco: Never Used  . Alcohol Use: No    FAMILY HISTORY:   Family History  Problem Relation Age of Onset  . Hypertension Father   . CAD Father   . Cancer Brother     unknown type    DRUG ALLERGIES:   Allergies  Allergen Reactions  . Accolate  [Zafirlukast]   .  Metronidazole   . Nitrofurantoin Monohyd Macro     GI upset  . Penicillins Diarrhea  . Keflex [Cephalexin] Rash    REVIEW OF SYSTEMS:  REVIEW OF SYSTEMS:  CONSTITUTIONAL: Denies fevers, chills, fatigue, weakness.  EYES: Denies blurred vision, double vision, or eye pain.  EARS, NOSE, THROAT: Denies tinnitus, ear pain, hearing loss.  RESPIRATORY: denies cough, shortness of breath, wheezing  CARDIOVASCULAR: Denies chest pain, palpitations, edema.  GASTROINTESTINAL: Denies nausea, vomiting, diarrhea, abdominal pain.  GENITOURINARY: Denies dysuria, hematuria.  ENDOCRINE: Denies nocturia or thyroid problems. HEMATOLOGIC AND LYMPHATIC: Denies easy bruising or bleeding.  SKIN: Denies rash or lesions.  MUSCULOSKELETAL: Denies pain in neck, back, shoulder, knees, hips, or further arthritic symptoms.  NEUROLOGIC: Denies paralysis, paresthesias.  PSYCHIATRIC: Denies anxiety or depressive symptoms. Otherwise full review of systems performed by me is negative.   MEDICATIONS AT HOME:   Prior to Admission medications   Medication Sig Start Date End Date Taking? Authorizing Provider  acetaminophen (TYLENOL) 500 MG tablet Take 1,000 mg by mouth at bedtime.    Historical Provider, MD  enalapril (VASOTEC) 10 MG tablet Take 1 tablet (10 mg total) by mouth daily. 04/25/15   Margarita Rana, MD  loratadine (ALLERGY RELIEF) 10 MG tablet Take 10 mg by mouth daily as needed for allergies.    Historical Provider, MD  traZODone (DESYREL) 100 MG  tablet Take 1 tablet (100 mg total) by mouth at bedtime. (Dose Change.) 03/13/15   Margarita Rana, MD      VITAL SIGNS:  Blood pressure 186/68, pulse 70, temperature 97.8 F (36.6 C), temperature source Oral, resp. rate 18, height 5\' 5"  (1.651 m), weight 55.339 kg (122 lb), SpO2 100 %.  PHYSICAL EXAMINATION:  VITAL SIGNS: Filed Vitals:   07/21/15 1424  BP: 186/68  Pulse: 70  Temp: 97.8 F (36.6 C)  Resp: 68   GENERAL:82 y.o.female currently in no acute  distress.  HEAD: Normocephalic, laceration right occipital status post staples.  EYES: Pupils equal, round, reactive to light. Extraocular muscles intact. No scleral icterus.  MOUTH: Moist mucosal membrane. Dentition intact. No abscess noted.  EAR, NOSE, THROAT: Clear without exudates. No external lesions.  NECK: Supple. No thyromegaly. No nodules. No JVD.  PULMONARY: Clear to ascultation, without wheeze rails or rhonci. No use of accessory muscles, Good respiratory effort. good air entry bilaterally CHEST: Nontender to palpation.  CARDIOVASCULAR: S1 and S2. Regular rate and rhythm. No murmurs, rubs, or gallops. No edema. Pedal pulses 2+ bilaterally.  GASTROINTESTINAL: Soft, nontender, nondistended. No masses. Positive bowel sounds. No hepatosplenomegaly.  MUSCULOSKELETAL: No swelling, clubbing, or edema. Range of motion full in all extremities.  NEUROLOGIC: Cranial nerves II through XII are intact. No gross focal neurological deficits. Sensation intact. Reflexes intact.  SKIN: No ulceration, lesions, rashes, or cyanosis. Skin warm and dry. Turgor intact.  PSYCHIATRIC: Mood, affect within normal limits. The patient is awake, alert and oriented x 3. Insight, judgment intact.    LABORATORY PANEL:   CBC  Recent Labs Lab 07/21/15 1429  WBC 5.0  HGB 12.3  HCT 35.4  PLT 141*   ------------------------------------------------------------------------------------------------------------------  Chemistries   Recent Labs Lab 07/21/15 1429  NA 138  K 3.9  CL 107  CO2 27  GLUCOSE 114*  BUN 28*  CREATININE 0.93  CALCIUM 9.9   ------------------------------------------------------------------------------------------------------------------  Cardiac Enzymes  Recent Labs Lab 07/21/15 1429  TROPONINI <0.03   ------------------------------------------------------------------------------------------------------------------  RADIOLOGY:  Ct Head Wo Contrast  07/21/2015  CLINICAL  DATA:  Lightheaded EXAM: CT HEAD WITHOUT CONTRAST TECHNIQUE: Contiguous axial images were obtained from the base of the skull through the vertex without intravenous contrast. COMPARISON:  08/09/2013 FINDINGS: There is prominence of the sulci and ventricles compatible with brain atrophy. Diffuse low attenuation within the subcortical and periventricular white matter is identified compatible with chronic microvascular disease. There is no abnormal extra-axial fluid collection, intracranial hemorrhage or mass. No evidence for acute brain infarct. The mastoid air cells are clear. The patient is status post bilateral median antrectomy. No evidence for acute sinus inflammation. The calvarium appears intact. There is a right parietal scalp laceration identified, image 26 of series 2. IMPRESSION: 1. No acute intracranial abnormalities. 2. Chronic microvascular disease and brain atrophy. Electronically Signed   By: Kerby Moors M.D.   On: 07/21/2015 15:37    EKG:   Orders placed or performed during the hospital encounter of 07/21/15  . ED EKG  . ED EKG  . EKG 12-Lead  . EKG 12-Lead  . ED EKG  . ED EKG    IMPRESSION AND PLAN:   82 year old Caucasian female history of essential hypertension presenting after near syncopal episode.  1. Near syncope, sounds like orthostatic: Telemetry, cardiac enzymes, IV fluid hydration, repeat orthostatic vital signs 2. Essential hypertension: Enalapril 3. Venous thromboembolism prophylactic: Lovenox    All the records are reviewed and case discussed with ED provider.  Management plans discussed with the patient, family and they are in agreement.  CODE STATUS: Full  TOTAL TIME TAKING CARE OF THIS PATIENT: 33 minutes.    Hower,  Karenann Cai.D on 07/21/2015 at 5:08 PM  Between 7am to 6pm - Pager - [phone]  After 6pm: House Pager: - [phone]  [facility]  Office  [phone]  CC: Primary care physician; Margarita Rana,  MD

## 2015-07-21 NOTE — Progress Notes (Signed)
Pt. In street clothes and refuses to change in to hospital gown, tele on and tele box is in right front pocket of pants. Towel placed behind head for laceration to right side of scalp. Pt. Stated the gowns are to cold and she needs to wear her street clothes. Extra blankets we applied to patient and the room temperature adjusted.

## 2015-07-21 NOTE — ED Notes (Signed)
Patient states she takes Enalapril 10mg  at bedtime which is causing her blood pressure to be elevated.

## 2015-07-21 NOTE — ED Notes (Signed)
Dr. Lavetta Nielsen notified of BP of 205/95. New order to give hydralazine IV.

## 2015-07-21 NOTE — ED Provider Notes (Signed)
[facility] Emergency Department Provider Note     Time seen: ----------------------------------------- 3:15 PM on 07/21/2015 -----------------------------------------    I have reviewed the triage vital signs and the nursing notes.   HISTORY  Chief Complaint Fall; Dizziness; and Head Laceration    HPI [name] is a 82 y.o. female who presents ER after she had a dizzy spell at home and sustained a head injury. Patient states she worked outside a lot yesterday and she feels like she overdid it. She states today she was bending over to place something in a box, she felt lightheaded when she stood back up and she fell hitting her head on a closet door. She denies loss of consciousness, she does not take blood thinners. She has sustained a laceration to the right temporal scalp.She denies any recent illness or changes in her medicines   Past Medical History  Diagnosis Date  . Hypertension   . Diverticulitis   . Gallstones   . Breast cyst     removed  . Low sodium levels     Patient Active Problem List   Diagnosis Date Noted  . Need for vaccination with 13-polyvalent pneumococcal conjugate vaccine 03/13/2015  . Foot pain 12/31/2014  . Anxiety 10/17/2014  . Allergic rhinitis 10/16/2014  . Airway hyperreactivity 10/16/2014  . Biliary calculi, common bile duct 10/16/2014  . Clinical depression 10/16/2014  . 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 Clicking shoulder [id number]  . Essential (primary) hypertension 10/16/2014  . H/O gastrointestinal hemorrhage 10/16/2014  . 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 Below normal amount of sodium in the blood 10/16/2014  . Insomnia 10/16/2014  . Bad memory 10/16/2014  . OP (osteoporosis) 10/16/2014  . Cyst of pancreas 10/16/2014  . Temporary cerebral vascular dysfunction 10/16/2014  . Other constipation 03/07/2014  . Right groin pain 03/07/2014  . Internal hemorrhoid 03/07/2014  . Gallstones, common  bile duct 08/29/2013  . Cholelithiasis 08/29/2013  . CYST AND PSEUDOCYST OF PANCREAS 03/12/2008    Past Surgical History  Procedure Laterality Date  . Bladder tack  1990's  . Appendectomy  1940's  . Abdominal hysterectomy  2000  . Breast cyst incision and drainage Left 1990's  . Ercp w/ sphicterotomy  09/12/2013    Recurrent stone identified post cholecystectomy.  . Cholecystectomy  09/11/13    Cholangiogram suggested a retained stone ductal dilatation.  . Nasal sinus surgery      Allergies Accolate ; Metronidazole; Nitrofurantoin monohyd macro; Penicillins; and Keflex  Social History Social History  Substance Use Topics  . Smoking status: Never Smoker   . Smokeless tobacco: Never Used  . Alcohol Use: No    Review of Systems Constitutional: Negative for fever. Eyes: Negative for visual changes. ENT: Negative for sore throat. Cardiovascular: Negative for chest pain. Respiratory: Negative for shortness of breath. Gastrointestinal: Negative for abdominal pain, vomiting and diarrhea. Genitourinary: Negative for dysuria. Musculoskeletal: Negative for back pain. Skin: Positive for scalp laceration Neurological: Negative for headaches, positive for dizziness  10-point ROS otherwise negative.  ____________________________________________   PHYSICAL EXAM:  VITAL SIGNS: ED Triage Vitals  Enc Vitals Group     BP 07/21/15 1424 186/68 mmHg     Pulse Rate 07/21/15 1424 70     Resp 07/21/15 1424 18     Temp 07/21/15 1424 97.8 F (36.6 C)     Temp Source 07/21/15 1424 Oral     SpO2 07/21/15 1424 100 %     Weight 07/21/15 1424 122 lb (55.339  kg)     Height 07/21/15 1424 5\' 5"  (1.651 m)     Head Cir --      Peak Flow --      Pain Score 07/21/15 1427 7     Pain Loc --      Pain Edu? --      Excl. in [place]? --     Constitutional: Alert and oriented. Well appearing and in no distress. Eyes: Conjunctivae are normal. PERRL. Normal extraocular movements. ENT   Head: 3.5  cm laceration that is curvilinear in the right temporal scalp   Nose: No congestion/rhinnorhea.    Mouth/Throat: Mucous membranes are moist.   Neck: No stridor. Cardiovascular: Normal rate, regular rhythm. Normal and symmetric distal pulses are present in all extremities. No murmurs, rubs, or gallops. Respiratory: Normal respiratory effort without tachypnea nor retractions. Breath sounds are clear and equal bilaterally. No wheezes/rales/rhonchi. Gastrointestinal: Soft and nontender. Normal bowel sounds Musculoskeletal: Nontender with normal range of motion in all extremities. No joint effusions.  No lower extremity tenderness nor edema. Neurologic:  Normal speech and language. No gross focal neurologic deficits are appreciated.  Skin:  Skin is warm, dry and intact. No rash noted. Psychiatric: Mood and affect are normal. Speech and behavior are normal. Patient exhibits appropriate insight and judgment. ____________________________________________  EKG: Interpreted by me. Normal sinus rhythm with a rate of 67 bpm, normal PR interval, normal QRS, normal QT interval. Normal axis.  ____________________________________________  ED COURSE:  Pertinent labs & imaging results that were available during my care of the patient were reviewed by me and considered in my medical decision making (see chart for details). Patient is in no acute distress, will check basic labs and reevaluate. She will also need laceration repair  LACERATION REPAIR Performed by: Earleen Newport Authorized by: Lenise Arena E Consent: Verbal consent obtained. Risks and benefits: risks, benefits and alternatives were discussed Consent given by: patient Patient identity confirmed: provided demographic data Prepped and Draped in normal sterile fashion Wound explored  Laceration Location: Right temporal scalp  Laceration Length: 4 cm  No Foreign Bodies seen or palpated  Anesthesia: Topical application    Topical anesthetic: LET  Irrigation method: syringe Amount of cleaning: standard  Skin closure: Staples   Number of sutures: 6   Technique:  Standard interrupted   Patient tolerance: Patient tolerated the procedure well with no immediate complications. ____________________________________________    LABS (pertinent positives/negatives)  Labs Reviewed  BASIC METABOLIC PANEL - Abnormal; Notable for the following:    Glucose, Bld 114 (*)    BUN 28 (*)    GFR calc non Af Amer 53 (*)    Anion gap 4 (*)    All other components within normal limits  CBC - Abnormal; Notable for the following:    RBC 3.73 (*)    Platelets 141 (*)    All other components within normal limits    RADIOLOGY Images were viewed by me  IMPRESSION: 1. No acute intracranial abnormalities. 2. Chronic microvascular disease and brain atrophy.   ____________________________________________  FINAL ASSESSMENT AND PLAN  Fall, head injury, laceration, dizziness  Plan: Patient with labs and imaging as dictated above. Patient states she does not feel well enough to go home. Urinalysis is pending at this time. I will discuss with the hospitalist for observation.   Earleen Newport, MD   Earleen Newport, MD 07/21/15 [id number]

## 2015-07-21 NOTE — Care Management Obs Status (Signed)
[place] NOTIFICATION   Patient Details  Name: [name] MRN: [id number] Date of Birth: [birth year]   Medicare Observation Status Notification Given:  Yes (fall Jerelyn Charles)    Ival Bible, RN 07/21/2015, 6:15 PM

## 2015-07-21 NOTE — ED Notes (Signed)
Pt states she had a dizzy spell at home and fell hitting the closet door, denies any LOC, states she is not on blood thinners, laceration to the back of head, bleeding controlled at this time

## 2015-07-22 ENCOUNTER — Other Ambulatory Visit: Payer: Self-pay | Admitting: *Deleted

## 2015-07-22 DIAGNOSIS — I10 Essential (primary) hypertension: Secondary | ICD-10-CM | POA: Diagnosis not present

## 2015-07-22 DIAGNOSIS — N3000 Acute cystitis without hematuria: Secondary | ICD-10-CM | POA: Diagnosis not present

## 2015-07-22 DIAGNOSIS — R55 Syncope and collapse: Secondary | ICD-10-CM | POA: Diagnosis not present

## 2015-07-22 DIAGNOSIS — S0101XA Laceration without foreign body of scalp, initial encounter: Secondary | ICD-10-CM | POA: Diagnosis not present

## 2015-07-22 LAB — CBC
HCT: 32.8 % — ABNORMAL LOW (ref 35.0–47.0)
Hemoglobin: 11.4 g/dL — ABNORMAL LOW (ref 12.0–16.0)
MCH: 32.9 pg (ref 26.0–34.0)
MCHC: 34.7 g/dL (ref 32.0–36.0)
MCV: 94.7 fL (ref 80.0–100.0)
Platelets: 127 10*3/uL — ABNORMAL LOW (ref 150–440)
RBC: 3.47 MIL/uL — ABNORMAL LOW (ref 3.80–5.20)
RDW: 13.1 % (ref 11.5–14.5)
WBC: 5.1 10*3/uL (ref 3.6–11.0)

## 2015-07-22 LAB — BASIC METABOLIC PANEL
Anion gap: 2 — ABNORMAL LOW (ref 5–15)
BUN: 21 mg/dL — ABNORMAL HIGH (ref 6–20)
CO2: 25 mmol/L (ref 22–32)
Calcium: 8.9 mg/dL (ref 8.9–10.3)
Chloride: 111 mmol/L (ref 101–111)
Creatinine, Ser: 0.78 mg/dL (ref 0.44–1.00)
GFR calc Af Amer: >60 mL/min (ref >60)
GFR calc non Af Amer: >60 mL/min (ref >60)
Glucose, Bld: 83 mg/dL (ref 65–99)
Potassium: 3.5 mmol/L (ref 3.5–5.1)
Sodium: 138 mmol/L (ref 135–145)

## 2015-07-22 MED ORDER — LEVOFLOXACIN IN D5W 250 MG/50ML IV SOLN
250.0000 mg | INTRAVENOUS | Status: DC
  Filled 2015-07-22: qty 50

## 2015-07-22 MED ORDER — LEVOFLOXACIN IN D5W 250 MG/50ML IV SOLN
250.0000 mg | Freq: Once | INTRAVENOUS | Status: AC
  Administered 2015-07-22: 250 mg via INTRAVENOUS
  Filled 2015-07-22: qty 50

## 2015-07-22 MED ORDER — ENALAPRIL MALEATE 10 MG PO TABS
10.0000 mg | ORAL_TABLET | Freq: Every day | ORAL | Status: DC
  Administered 2015-07-22 – 2015-07-23 (×2): 10 mg via ORAL
  Filled 2015-07-22 (×2): qty 1

## 2015-07-22 MED ORDER — LEVOFLOXACIN IN D5W 750 MG/150ML IV SOLN: 750.0000 mg | INTRAVENOUS | Status: DC

## 2015-07-22 MED ORDER — TRAZODONE HCL 50 MG PO TABS
50.0000 mg | ORAL_TABLET | Freq: Every day | ORAL | Status: DC
  Administered 2015-07-22: 50 mg via ORAL

## 2015-07-22 NOTE — Progress Notes (Signed)
Blood pressure elevated, PO BP meds ordered. First dose given, will reassess in 1 hour. [name]

## 2015-07-22 NOTE — Progress Notes (Signed)
[name] at [name] NAME: [name]    MR#:  [id number]  DATE OF BIRTH:  09-27-1924  SUBJECTIVE:  CHIEF COMPLAINT:  Patient is resting comfortably. Reporting some pain in the right scalp laceration area. Denies any nausea or vomiting. Denies any abdominal pain.  REVIEW OF SYSTEMS:  CONSTITUTIONAL: No fever, fatigue or weakness. right scalp hematoma EYES: No blurred or double vision.  EARS, NOSE, AND THROAT: No tinnitus or ear pain.  RESPIRATORY: No cough, shortness of breath, wheezing or hemoptysis.  CARDIOVASCULAR: No chest pain, orthopnea, edema.  GASTROINTESTINAL: No nausea, vomiting, diarrhea or abdominal pain.  GENITOURINARY: No dysuria, hematuria.  ENDOCRINE: No polyuria, nocturia,  HEMATOLOGY: No anemia, easy bruising or bleeding SKIN: No rash or lesion. MUSCULOSKELETAL: No joint pain or arthritis.   NEUROLOGIC: No tingling, numbness, weakness.  PSYCHIATRY: No anxiety or depression.   DRUG ALLERGIES:   Allergies  Allergen Reactions  . Accolate  [Zafirlukast]   . Metronidazole   . Nitrofurantoin Monohyd Macro Other (See Comments)    GI upset  . Penicillins Diarrhea  . Keflex [Cephalexin] Rash    VITALS:  Blood pressure 196/70, pulse 65, temperature 97.9 F (36.6 C), temperature source Oral, resp. rate 18, height 5\' 5"  (1.651 m), weight 55.339 kg (122 lb), SpO2 97 %.  PHYSICAL EXAMINATION:  GENERAL:  82 y.o.-year-old patient lying in the bed with no acute distress.  EYES: Pupils equal, round, reactive to light and accommodation. No scleral icterus. Extraocular muscles intact.  HEENT: Head atraumatic, normocephalic. Oropharynx and nasopharynx clear. Right scalp hematoma and laceration with 7 staples NECK:  Supple, no jugular venous distention. No thyroid enlargement, no tenderness.  LUNGS: Normal breath sounds bilaterally, no wheezing, rales,rhonchi or crepitation. No use of accessory muscles of respiration.   CARDIOVASCULAR: S1, S2 normal. No murmurs, rubs, or gallops.  ABDOMEN: Soft, nontender, nondistended. Bowel sounds present. No organomegaly or mass.  EXTREMITIES: No pedal edema, cyanosis, or clubbing.  NEUROLOGIC: Cranial nerves II through XII are intact. Muscle strength 5/5 in all extremities. Sensation intact. Gait not checked.  PSYCHIATRIC: The patient is alert and oriented x 3.  SKIN: No obvious rash, lesion, or ulcer.    LABORATORY PANEL:   CBC  Recent Labs Lab 07/22/15 0414  WBC 5.1  HGB 11.4*  HCT 32.8*  PLT 127*   ------------------------------------------------------------------------------------------------------------------  Chemistries   Recent Labs Lab 07/22/15 0414  NA 138  K 3.5  CL 111  CO2 25  GLUCOSE 83  BUN 21*  CREATININE 0.78  CALCIUM 8.9   ------------------------------------------------------------------------------------------------------------------  Cardiac Enzymes  Recent Labs Lab 07/21/15 1429  TROPONINI <0.03   ------------------------------------------------------------------------------------------------------------------  RADIOLOGY:  Ct Head Wo Contrast  07/21/2015  CLINICAL DATA:  Lightheaded EXAM: CT HEAD WITHOUT CONTRAST TECHNIQUE: Contiguous axial images were obtained from the base of the skull through the vertex without intravenous contrast. COMPARISON:  08/09/2013 FINDINGS: There is prominence of the sulci and ventricles compatible with brain atrophy. Diffuse low attenuation within the subcortical and periventricular white matter is identified compatible with chronic microvascular disease. There is no abnormal extra-axial fluid collection, intracranial hemorrhage or mass. No evidence for acute brain infarct. The mastoid air cells are clear. The patient is status post bilateral median antrectomy. No evidence for acute sinus inflammation. The calvarium appears intact. There is a right parietal scalp laceration identified, image 26  of series 2. IMPRESSION: 1. No acute intracranial abnormalities. 2. Chronic microvascular disease and brain atrophy. Electronically Signed  By: [name] M.D.   On: 07/21/2015 15:37    EKG:   Orders placed or performed during the hospital encounter of 07/21/15  . ED EKG  . ED EKG  . EKG 12-Lead  . EKG 12-Lead  . ED EKG  . ED EKG    ASSESSMENT AND PLAN:   82 year old Caucasian female history of essential hypertension presenting after near syncopal episode.  #. Near syncope, could be orthostatic from dehydration from underlying UTI:  Telemetry-nave and so far  Initial troponin 0.03, patient is asymptomatic  IV fluid hydration, repeat orthostatic vital signs Urine culture and sensitivities pending  PT evaluation  #. Acute cystitis with abnormal urinalysis Urinalysis is abnormal. Urine culture and sensitivities pending. Provide IV fluids and provide levofloxacin   #Right scalp hematoma/laceration status post repair with the 7 staples Ice packs and symptomatic treatment  #. Essential hypertension: Enalapril  #. Venous thromboembolism prophylactic: Lovenox     All the records are reviewed and case discussed with Care Management/Social Workerr. Management plans discussed with the patient, family and they are in agreement.  CODE STATUS: fc  TOTAL TIME TAKING CARE OF THIS PATIENT: 35  minutes.   POSSIBLE D/C IN 1-2 DAYS, DEPENDING ON CLINICAL CONDITION.   [name] M.D on 07/22/2015 at 3:25 PM  Between 7am to 6pm - Pager - [phone] After 6pm go to [email or web address] - password [email or web address] [facility]  [place] [facility]  Office  [phone]  CC: Primary care physician; [name] Rana, MD

## 2015-07-22 NOTE — Consult Note (Signed)
   [facility] CM Inpatient Consult   07/22/2015  [name] 1925-02-17 [id number]  Chart review revealed patient eligible for [facility] management care management services with diagnosis of HTN and recent falls for post hospital discharge follow up. Patient was evaluated for community based chronic disease management services with [facility] care Management Program as a benefit of patient's [employer] Medicare. Met with the patient at the bedside to explain [facility] services. Patient endorses her primary care provider to be Dr. Margarita Rana. 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 She states the best number to contact her is [phone] and gives signed permission to speak to her son about her health care. Consent form signed. Patient will receive post hospital discharge calls and be evaluated for monthly home visits. [facility] Care Management services does not interfere with or replace any services arranged by the inpatient care management team. RNCM left contact information and [facility] literature at the bedside. Made inpatient RNCM aware that [facility] will be following for care management. For additional questions please contact:   Janci Minor RN, [place] Liaison  [phone]) Business Mobile [phone]) Toll free office

## 2015-07-22 NOTE — Care Management (Signed)
Placed in observation for syncope.  Independent in all adls, denies issues accessing medical care, obtaining medications or with transportation.  Current with her PCP.  No discharge needs identified at present by care manager or members of care team

## 2015-07-22 NOTE — Progress Notes (Signed)
Pharmacy Antibiotic Note  [name] is a 82 y.o. female admitted on 07/21/2015 with UTI.  Pharmacy has been consulted for levofloxacin dosing.  Plan: Levofloxacin 250 mg IV q24h currently ordered based on renal function and indication. This patient's current antibiotics will be continued without adjustments.   Height: 5\' 5"  (165.1 cm) Weight: 122 lb (55.339 kg) IBW/kg (Calculated) : 57  Temp (24hrs), Avg:97.9 F (36.6 C), Min:97.8 F (36.6 C), Max:98 F (36.7 C)   Recent Labs Lab 07/21/15 1429 07/22/15 0414  WBC 5.0 5.1  CREATININE 0.93 0.78    Estimated Creatinine Clearance: 42.4 mL/min (by C-G formula based on Cr of 0.78).    Allergies  Allergen Reactions  . Accolate  [Zafirlukast]   . Metronidazole   . Nitrofurantoin Monohyd Macro Other (See Comments)    GI upset  . Penicillins Diarrhea  . Keflex [Cephalexin] Rash    Antimicrobials this admission: levofloxacin 4/9 >>    Microbiology results: 4/10 Urine Cx: pending  Thank you for allowing pharmacy to be a part of this patient's care.  Scarpena,Crystal G 07/22/2015 1:13 PM

## 2015-07-22 NOTE — Progress Notes (Signed)
Patient BP resolved with PO BP medication. [name]

## 2015-07-23 ENCOUNTER — Telehealth: Payer: Self-pay | Admitting: Family Medicine

## 2015-07-23 DIAGNOSIS — S0101XA Laceration without foreign body of scalp, initial encounter: Secondary | ICD-10-CM | POA: Diagnosis not present

## 2015-07-23 DIAGNOSIS — R55 Syncope and collapse: Secondary | ICD-10-CM | POA: Diagnosis not present

## 2015-07-23 DIAGNOSIS — N3000 Acute cystitis without hematuria: Secondary | ICD-10-CM | POA: Diagnosis not present

## 2015-07-23 DIAGNOSIS — I10 Essential (primary) hypertension: Secondary | ICD-10-CM | POA: Diagnosis not present

## 2015-07-23 LAB — BASIC METABOLIC PANEL
Anion gap: 5 (ref 5–15)
BUN: 17 mg/dL (ref 6–20)
CO2: 21 mmol/L — ABNORMAL LOW (ref 22–32)
Calcium: 8.4 mg/dL — ABNORMAL LOW (ref 8.9–10.3)
Chloride: 106 mmol/L (ref 101–111)
Creatinine, Ser: 0.84 mg/dL (ref 0.44–1.00)
GFR calc Af Amer: >60 mL/min (ref >60)
GFR calc non Af Amer: >60 mL/min (ref >60)
Glucose, Bld: 85 mg/dL (ref 65–99)
Potassium: 3.7 mmol/L (ref 3.5–5.1)
Sodium: 132 mmol/L — ABNORMAL LOW (ref 135–145)

## 2015-07-23 LAB — CBC
HCT: 33 % — ABNORMAL LOW (ref 35.0–47.0)
Hemoglobin: 11.6 g/dL — ABNORMAL LOW (ref 12.0–16.0)
MCH: 33.2 pg (ref 26.0–34.0)
MCHC: 35 g/dL (ref 32.0–36.0)
MCV: 94.8 fL (ref 80.0–100.0)
Platelets: 136 10*3/uL — ABNORMAL LOW (ref 150–440)
RBC: 3.48 MIL/uL — ABNORMAL LOW (ref 3.80–5.20)
RDW: 12.8 % (ref 11.5–14.5)
WBC: 4.5 10*3/uL (ref 3.6–11.0)

## 2015-07-23 MED ORDER — METOPROLOL TARTRATE 25 MG PO TABS: 12.5000 mg | ORAL_TABLET | Freq: Two times a day (BID) | ORAL | Status: DC

## 2015-07-23 MED ORDER — METOPROLOL TARTRATE 25 MG PO TABS
12.5000 mg | ORAL_TABLET | Freq: Once | ORAL | Status: AC
  Administered 2015-07-23: 13:00:00 via ORAL
  Filled 2015-07-23: qty 1

## 2015-07-23 MED ORDER — OXYCODONE HCL 5 MG PO TABS: 5.0000 mg | ORAL_TABLET | ORAL | Status: DC | PRN

## 2015-07-23 MED ORDER — ACETAMINOPHEN 325 MG PO TABS: 650.0000 mg | ORAL_TABLET | Freq: Four times a day (QID) | ORAL | Status: DC | PRN

## 2015-07-23 MED ORDER — LEVOFLOXACIN 500 MG PO TABS
500.0000 mg | ORAL_TABLET | Freq: Once | ORAL | Status: AC
  Administered 2015-07-23: 500 mg via ORAL
  Filled 2015-07-23: qty 1

## 2015-07-23 MED ORDER — CIPROFLOXACIN HCL 250 MG PO TABS: 250.0000 mg | ORAL_TABLET | Freq: Two times a day (BID) | ORAL | Status: DC

## 2015-07-23 MED ORDER — METOPROLOL TARTRATE 25 MG PO TABS: 25.0000 mg | ORAL_TABLET | Freq: Two times a day (BID) | ORAL | Status: DC

## 2015-07-23 MED ORDER — METOPROLOL TARTRATE 25 MG PO TABS
12.5000 mg | ORAL_TABLET | Freq: Once | ORAL | Status: AC
  Administered 2015-07-23: 12.5 mg via ORAL
  Filled 2015-07-23: qty 1

## 2015-07-23 NOTE — Discharge Summary (Signed)
[place] at [place] NAME: [name]    MR#:  [id number]  DATE OF BIRTH:  June 17, 1924  DATE OF ADMISSION:  07/21/2015 ADMITTING PHYSICIAN: Lytle Butte, MD  DATE OF DISCHARGE: 07/23/2015  PRIMARY CARE PHYSICIAN: Margarita Rana, MD    ADMISSION DIAGNOSIS:  Dizziness [R42] Fall, initial encounter [W19.XXXA] Head injury, initial encounter [S09.90XA] Scalp laceration, initial encounter [S01.01XA]  DISCHARGE DIAGNOSIS:  Principal Problem:   Near syncope  scalp laceration was repaired with staples Acute cystitis  SECONDARY DIAGNOSIS:   Past Medical History  Diagnosis Date  . Hypertension   . Diverticulitis   . Gallstones   . Breast cyst     removed  . Low sodium levels     HOSPITAL COURSE:   83 year old Caucasian female history of essential hypertension presenting after near syncopal episode.  #. Near syncope, could be orthostatic from dehydration from underlying UTI: Telemetry-negative  so far Initial troponin 0.03, patient is asymptomatic IV fluid hydration given, patient is not orthostatic Urine culture and sensitivities -greater than 100,000 colonies. Sensitivity pending PCP to follow up on that. Will discharge patient with by mouth ciprofloxacin PT evaluation-recommended home health PT with rolling walker  #. Acute cystitis with abnormal urinalysis Patient is afebrile. No leukocytosis Urinalysis is abnormal. Urine culture and sensitivities pending, PCP to follow up on the final results. Provided IV fluids and levofloxacin , patient is clinically improving will discharge with by mouth ciprofloxacin  #Right scalp hematoma/laceration status post repair with the 7 staples Ice packs and symptomatic treatment Needs staple removal in 7 days, follow-up with primary care physician  #. Essential hypertension: Enalapril. Added metoprolol 12.5 mg by mouth twice a day as blood pressure is elevated. PCP to titrate the dose  when necessary  #. Venous thromboembolism prophylactic: Lovenox during hospital course    DISCHARGE CONDITIONS:   fair  CONSULTS OBTAINED:  Treatment Team:  Lytle Butte, MD   PROCEDURES repair of right scalp laceration with 7 staples in the ED  DRUG ALLERGIES:   Allergies  Allergen Reactions  . Accolate  [Zafirlukast]   . Metronidazole   . Nitrofurantoin Monohyd Macro Other (See Comments)    GI upset  . Penicillins Diarrhea  . Keflex [Cephalexin] Rash    DISCHARGE MEDICATIONS:   Current Discharge Medication List    START taking these medications   Details  acetaminophen (TYLENOL) 325 MG tablet Take 2 tablets (650 mg total) by mouth every 6 (six) hours as needed for mild pain (or Fever >/= 101).    ciprofloxacin (CIPRO) 250 MG tablet Take 1 tablet (250 mg total) by mouth 2 (two) times daily. Qty: 10 tablet, Refills: 0    metoprolol tartrate (LOPRESSOR) 25 MG tablet Take 0.5 tablets (12.5 mg total) by mouth 2 (two) times daily. Qty: 60 tablet, Refills: 0    oxyCODONE (OXY IR/ROXICODONE) 5 MG immediate release tablet Take 1 tablet (5 mg total) by mouth every 4 (four) hours as needed for moderate pain. Qty: 30 tablet, Refills: 0      CONTINUE these medications which have NOT CHANGED   Details  enalapril (VASOTEC) 10 MG tablet Take 1 tablet (10 mg total) by mouth daily. Qty: 90 tablet, Refills: 1   Associated Diagnoses: Essential (primary) hypertension    loratadine (ALLERGY RELIEF) 10 MG tablet Take 10 mg by mouth daily as needed for allergies.    traZODone (DESYREL) 100 MG tablet Take 1 tablet (100 mg total) by mouth  at bedtime. (Dose Change.) Qty: 90 tablet, Refills: 1   Associated Diagnoses: Insomnia         DISCHARGE INSTRUCTIONS:   Follow-up with primary care physician in 4-5 days for staple removal  DIET:  Low-salt  DISCHARGE CONDITION:  Fair  ACTIVITY:  Activity as tolerated per PT recommendations with rolling walker,  OXYGEN:  Home  Oxygen: No.   Oxygen Delivery: room air  DISCHARGE LOCATION:  home   If you experience worsening of your admission symptoms, develop shortness of breath, life threatening emergency, suicidal or homicidal thoughts you must seek medical attention immediately by calling 911 or calling your MD immediately  if symptoms less severe.  You Must read complete instructions/literature along with all the possible adverse reactions/side effects for all the Medicines you take and that have been prescribed to you. Take any new Medicines after you have completely understood and accpet all the possible adverse reactions/side effects.   Please note  You were cared for by a hospitalist during your hospital stay. If you have any questions about your discharge medications or the care you received while you were in the hospital after you are discharged, you can call the unit and asked to speak with the hospitalist on call if the hospitalist that took care of you is not available. Once you are discharged, your primary care physician will handle any further medical issues. Please note that NO REFILLS for any discharge medications will be authorized once you are discharged, as it is imperative that you return to your primary care physician (or establish a relationship with a primary care physician if you do not have one) for your aftercare needs so that they can reassess your need for medications and monitor your lab values.     Today  Chief Complaint  Patient presents with  . Fall  . Dizziness  . Head Laceration   Patient is feeling fine. Denies any dizziness chest pain or shortness of breath. Wants to go home  ROS:  CONSTITUTIONAL: Denies fevers, chills. Denies any fatigue, weakness.  EYES: Denies blurry vision, double vision, eye pain. EARS, NOSE, THROAT: Denies tinnitus, ear pain, hearing loss. RESPIRATORY: Denies cough, wheeze, shortness of breath.  CARDIOVASCULAR: Denies chest pain, palpitations, edema.   GASTROINTESTINAL: Denies nausea, vomiting, diarrhea, abdominal pain. Denies bright red blood per rectum. GENITOURINARY: Denies dysuria, hematuria. ENDOCRINE: Denies nocturia or thyroid problems. HEMATOLOGIC AND LYMPHATIC: Denies easy bruising or bleeding. SKIN: Denies rash or lesion.Right scalp laceration and hematoma  MUSCULOSKELETAL: Denies pain in neck, back, shoulder, knees, hips or arthritic symptoms.  NEUROLOGIC: Denies paralysis, paresthesias.  PSYCHIATRIC: Denies anxiety or depressive symptoms.   VITAL SIGNS:  Blood pressure 151/66, pulse 61, temperature 97.4 F (36.3 C), temperature source Oral, resp. rate 18, height 5\' 5"  (1.651 m), weight 55.339 kg (122 lb), SpO2 97 %.  I/O:    Intake/Output Summary (Last 24 hours) at 07/23/15 1157 Last data filed at 07/23/15 0952  Gross per 24 hour  Intake 2393.25 ml  Output   1100 ml  Net 1293.25 ml    PHYSICAL EXAMINATION:  GENERAL:  82 y.o.-year-old patient lying in the bed with no acute distress.  EYES: Pupils equal, round, reactive to light and accommodation. No scleral icterus. Extraocular muscles intact.  HEENT: Head atraumatic, normocephalic. Oropharynx and nasopharynx clear.  NECK:  Supple, no jugular venous distention. No thyroid enlargement, no tenderness.  LUNGS: Normal breath sounds bilaterally, no wheezing, rales,rhonchi or crepitation. No use of accessory muscles of respiration.  CARDIOVASCULAR:  S1, S2 normal. No murmurs, rubs, or gallops.  ABDOMEN: Soft, non-tender, non-distended. Bowel sounds present. No organomegaly or mass.  EXTREMITIES: No pedal edema, cyanosis, or clubbing.  NEUROLOGIC: Cranial nerves II through XII are intact. Muscle strength 5/5 in all extremities. Sensation intact. Gait not checked.  PSYCHIATRIC: The patient is alert and oriented x 3.  SKIN: No obvious rash or ulcers. right scalp laceration with hematoma intact with 7 staples and healing well,   DATA REVIEW:   CBC  Recent Labs Lab  07/23/15 0419  WBC 4.5  HGB 11.6*  HCT 33.0*  PLT 136*    Chemistries   Recent Labs Lab 07/23/15 0419  NA 132*  K 3.7  CL 106  CO2 21*  GLUCOSE 85  BUN 17  CREATININE 0.84  CALCIUM 8.4*    Cardiac Enzymes  Recent Labs Lab 07/21/15 1429  [place] <0.03    Microbiology Results  Results for orders placed or performed during the hospital encounter of 07/21/15  Urine culture     Status: Abnormal (Preliminary result)   Collection Time: 07/22/15  2:10 PM  Result Value Ref Range Status   Specimen Description URINE, RANDOM  Final   Special Requests NONE  Final   Culture (A)  Final    >=100,000 COLONIES/mL GRAM NEGATIVE RODS IDENTIFICATION AND SUSCEPTIBILITIES TO FOLLOW    Report Status PENDING  Incomplete    RADIOLOGY:  Ct Head Wo Contrast  07/21/2015  CLINICAL DATA:  Lightheaded EXAM: CT HEAD WITHOUT CONTRAST TECHNIQUE: Contiguous axial images were obtained from the base of the skull through the vertex without intravenous contrast. COMPARISON:  08/09/2013 FINDINGS: There is prominence of the sulci and ventricles compatible with brain atrophy. Diffuse low attenuation within the subcortical and periventricular white matter is identified compatible with chronic microvascular disease. There is no abnormal extra-axial fluid collection, intracranial hemorrhage or mass. No evidence for acute brain infarct. The mastoid air cells are clear. The patient is status post bilateral median antrectomy. No evidence for acute sinus inflammation. The calvarium appears intact. There is a right parietal scalp laceration identified, image 26 of series 2. IMPRESSION: 1. No acute intracranial abnormalities. 2. Chronic microvascular disease and brain atrophy. Electronically Signed   By: Kerby Moors M.D.   On: 07/21/2015 15:37    EKG:   Orders placed or performed during the hospital encounter of 07/21/15  . ED EKG  . ED EKG  . EKG 12-Lead  . EKG 12-Lead  . ED EKG  . ED EKG       Management plans discussed with the patient, family and they are in agreement.  CODE STATUS:     Code Status Orders        Start     Ordered   07/21/15 1651  Full code   Continuous     07/21/15 1650    Code Status History    Date Active Date Inactive Code Status Order ID Comments User Context   This patient has a current code status but no historical code status.    Advance Directive Documentation        Most Recent Value   Type of Advance Directive  Healthcare Power of Attorney   Pre-existing out of facility DNR order (yellow form or pink MOST form)     "MOST" Form in Place?        TOTAL TIME TAKING CARE OF THIS PATIENT: 45 minutes.    @[facility] @  on 07/23/2015 at 11:57 AM  Between 7am to  6pm - Pager - [phone]  After 6pm go to [email or web address] - password [email or web address] [facility]  [place] [facility]  Office  [phone]  CC: Primary care physician; Margarita Rana, MD

## 2015-07-23 NOTE — Evaluation (Signed)
Physical Therapy Evaluation Patient Details Name: [name] MRN: [id number] DOB: Mar 05, 1925 Today's Date: 07/23/2015   History of Present Illness  presented to ER and admitted under observation after near syncopal episode wtih fall, head laceration (7 staples) but no LOC; noted with mild dehydration and UTI.  Clinical Impression  Upon evaluation, patient alert and oriented; follows all commands and demonstrates good safety awareness/insight.  Bilat UE/LE strength and ROM grossly WFL and symmetrical (baseline arthritic changes in bilat hands evident); denies pain, sensory deficit at this time.  Vitals stable and WFL; no subjective or objective signs of orthostasis noted.  Able to complete bed mobility indep; sit/stand, basic transfers and gait (120') with RW, close sup.  Patient does prefer use of RW at this time due to "feeling stiff and weak"; will continue use at this time. Would benefit from skilled PT to address above deficits and promote optimal return to PLOF; Recommend transition to [place] upon discharge from acute hospitalization.     Follow Up Recommendations Home health PT    Equipment Recommendations  Rolling walker with 5" wheels    Recommendations for Other Services       Precautions / Restrictions Precautions Precautions: Fall Restrictions Weight Bearing Restrictions: No      Mobility  Bed Mobility Overal bed mobility: Modified Independent                Transfers Overall transfer level: Needs assistance Equipment used: None Transfers: Sit to/from Stand Sit to Stand: Min guard;Supervision            Ambulation/Gait Ambulation/Gait assistance: Supervision Ambulation Distance (Feet): 125 Feet Assistive device: Rolling walker (2 wheeled)       General Gait Details: reciprocal stepping pattern with fair step height/length; slow, but steady, cadence without buckling, LOB or safety concern.  Good ability to regulate activity and pace self; good  safety awareness noted.  Stairs            Wheelchair Mobility    Modified Rankin (Stroke Patients Only)       Balance Overall balance assessment: Needs assistance Sitting-balance support: No upper extremity supported;Feet supported Sitting balance-Leahy Scale: Good     Standing balance support: No upper extremity supported Standing balance-Leahy Scale: Fair                               Pertinent Vitals/Pain Pain Assessment: No/denies pain    Home Living Family/patient expects to be discharged to:: Private residence Living Arrangements: Children (son is bilat amputee, WC level as primary mobility) Available Help at Discharge: Family;Friend(s);Available PRN/intermittently Type of Home: House Home Access: Ramped entrance     Home Layout: One level Home Equipment: Cane - single point      Prior Function Level of Independence: Independent with assistive device(s)         Comments: Indep for ADLs and household mobility; mod indep with SPC for community distances.  Denies fall history outside of this episode.     Hand Dominance   Dominant Hand: Right    Extremity/Trunk Assessment   Upper Extremity Assessment: Overall WFL for tasks assessed           Lower Extremity Assessment: Overall WFL for tasks assessed         Communication   Communication: No difficulties  Cognition Arousal/Alertness: Awake/alert Behavior During Therapy: WFL for tasks assessed/performed Overall Cognitive Status: Within Functional Limits for tasks assessed  General Comments      Exercises Other Exercises Other Exercises: Toilet transfer, SPT without assist device, cga/close sup; prefers UE support on external surfaces when assist device not utilized.  sit/stand from [facility] with RW, close sup/mod indep; hygiene, mod indep with setup; standing balance without assist device, close sup.      Assessment/Plan    PT Assessment  Patient needs continued PT services  PT Diagnosis Generalized weakness;Difficulty walking   PT Problem List Decreased activity tolerance;Decreased balance;Decreased mobility  PT Treatment Interventions DME instruction;Gait training;Functional mobility training;Therapeutic activities;Therapeutic exercise;Balance training;Patient/family education   PT Goals (Current goals can be found in the Care Plan section) Acute Rehab PT Goals Patient Stated Goal: to return home this afternoon PT Goal Formulation: With patient Time For Goal Achievement: 08/06/15 Potential to Achieve Goals: Good    Frequency Min 2X/week   Barriers to discharge Decreased caregiver support      Co-evaluation               End of Session Equipment Utilized During Treatment: Gait belt Activity Tolerance: Patient tolerated treatment well Patient left: in bed;with call bell/phone within reach;with bed alarm set Nurse Communication: Mobility status    Functional Assessment Tool Used: clinical judgement Functional Limitation: Mobility: Walking and moving around Mobility: Walking and Moving Around Current Status [id number]): At least 20 percent but less than 40 percent impaired, limited or restricted Mobility: Walking and Moving Around Goal Status [phone]): At least 1 percent but less than 20 percent impaired, limited or restricted    Time: 0900-0930 PT Time Calculation (min) (ACUTE ONLY): 30 min   Charges:   PT Evaluation $PT Eval Low Complexity: 1 Procedure PT Treatments $Therapeutic Activity: 8-22 mins   PT G Codes:   PT G-Codes **NOT FOR INPATIENT CLASS** Functional Assessment Tool Used: clinical judgement Functional Limitation: Mobility: Walking and moving around Mobility: Walking and Moving Around Current Status [id number]): At least 20 percent but less than 40 percent impaired, limited or restricted Mobility: Walking and Moving Around Goal Status [phone]): At least 1 percent but less than 20 percent impaired,  limited or restricted    Kristen H. Owens Shark, PT, DPT, NCS 07/23/2015, 9:57 AM [phone]

## 2015-07-23 NOTE — Plan of Care (Signed)
Discharge instructions given to and reviewed with patient and Granddaughter.  Both verbalized understanding.  Prescriptions and home rolling walker given to pt.  IV and telemetry discontinued per policy and procedure.  VSS, Pt in NAD.  Pt discharged home.

## 2015-07-23 NOTE — Care Management (Signed)
Patient is for discharge home today.  Physical therapy has recommended home health physical therapy  and a front wheeled rolling walker. patient will also benefit from home health nursing for blood pressure and orthostatic vital signs.  She has concerns that her house "is too [place]" for someone to come.  her son lives in the home and is a double amputee and "has lots of equipment all over the house.".  Agency preference is [facility].  Referral called.  this agency is in network with her insurance.  She has no agency preference for the walker.  This [facility].   Confirmed her address and phone number.  Current with her pcp Dr Venia Minks

## 2015-07-23 NOTE — Progress Notes (Signed)
Received order for metoprolol and leviquin one time dose to be given to patient. BP slightly elevated when first dose of metoprolol given, per Dr. Margaretmary Eddy give one more dose of 12.5 metoprolol at recheck if above 140 SBP before discharge. Patient also expressed concern about not getting prescriptions today, one time dose of leviquin given to cover today's antibiotic before discharge. [name]

## 2015-07-23 NOTE — Discharge Instructions (Signed)
Activity as tolerated per PT recommendations with rolling walker Diet low-salt Follow-up with primary care physician in 5 days for staple removal. Continue wound care, PCP to follow-up on the pending urine culture results

## 2015-07-23 NOTE — Telephone Encounter (Signed)
Pt is being discharged from [facility] for having high blood pressure.  Pt is being discharged today.  I have scheduled a hospital follow up/MW

## 2015-07-24 ENCOUNTER — Other Ambulatory Visit: Payer: Self-pay | Admitting: *Deleted

## 2015-07-24 ENCOUNTER — Telehealth: Payer: Self-pay | Admitting: Family Medicine

## 2015-07-24 DIAGNOSIS — N390 Urinary tract infection, site not specified: Secondary | ICD-10-CM | POA: Diagnosis not present

## 2015-07-24 DIAGNOSIS — Z9181 History of falling: Secondary | ICD-10-CM | POA: Diagnosis not present

## 2015-07-24 DIAGNOSIS — R42 Dizziness and giddiness: Secondary | ICD-10-CM | POA: Diagnosis not present

## 2015-07-24 DIAGNOSIS — M6281 Muscle weakness (generalized): Secondary | ICD-10-CM | POA: Diagnosis not present

## 2015-07-24 DIAGNOSIS — I10 Essential (primary) hypertension: Secondary | ICD-10-CM | POA: Diagnosis not present

## 2015-07-24 DIAGNOSIS — R2689 Other abnormalities of gait and mobility: Secondary | ICD-10-CM | POA: Diagnosis not present

## 2015-07-24 LAB — URINE CULTURE: Culture: >=100000 — AB

## 2015-07-24 NOTE — Telephone Encounter (Signed)
Verbal order for PT given to [facility] at [facility]. Lexine Baton will advise [employer].

## 2015-07-24 NOTE — Telephone Encounter (Signed)
[id number] 

## 2015-07-24 NOTE — Telephone Encounter (Signed)
Ok to order. Thanks.   

## 2015-07-24 NOTE — Telephone Encounter (Signed)
Suezanne Jacquet has completed eval and needs 6 visits for Pt.  He needs Verbal order.  Please call him at   [phone]  Thanks teri

## 2015-07-24 NOTE — Patient Outreach (Signed)
[facility]) Care Management  07/24/2015  [name] [birth year] [id number]   Transition of care initial call  RNCM placed call to patient as part of the transition of care program, HIPAA verified. Mrs. [name] states she is doing pretty good, just still a little weak from being in the hospital.Family has visited and helped with providing food, and son she able to help with cooking.   Patient was able to get prescriptions filled taking medications as prescribed, Educated on importance of completing all of antibiotic.  Mrs.[name] states she is using her walker at home, denies dizziness, or increased pain,  home health physical therapy has visit scheduled for this pm. Discussed with patient to notify MD of new symptoms, dizziness or concerns. Patient reports tolerating eating okay, encouraged to drink water at least 6 glasses of water, patient reports she drinks gatorade also,and has been doing so since she had a problem with low sodium once, she reports her doctor is aware,discussed not drinking large amounts unless instructed by MD, due to salt and other contents.  Mrs.[name] has post hospital visit with PCP on 4/17 she states he has a friend that will provide transportation, she voiced she dreads going to the appointment due to the staples in her head may have to be removed at that time.  Mrs.[name] does not monitor her blood pressure at home ,unsure if she will be able to afford.  Provided patient with my contact information, 24 hour nurse line number and toll free number  Plan Patient will remain active with transition of care program, and will receive telephone call on next week, from care manager covering for me , and I will follow up the following week with a home visit . Will send patient Welcome letter, and MD letter of involvement. If patient unable to obtain blood pressure cuff will explore [facility] resources for obtaining monitor   [facility] CM Care Plan Problem One    Most Recent Value   Care Plan Problem One  Recent hospital admission   Role Documenting the Problem One  Care Management [place] for Problem One  Active   [facility] Long Term Goal (31-90 days)  Patient will not experience a hospital admission in the next 31 days   [facility]N Long Term Goal Start Date  07/24/15   Interventions for Problem One Long Term Goal  Introduced patient to transition of care program, education on importance of taking medications, following with PCP , and notifying MD sooner of new symptoms or concerns to arrange office visit   [facility] CM Short Term Goal #1 (0-30 days)  Patient will attend PCP appointment in the next 14 days   [facility] CM Short Term Goal #1 Start Date  07/24/15   Interventions for Short Term Goal #1  Verified patient has a PCP post hospital visit scheduled, verified she has transportation, and educated on the importance of attending    [facility]N CM Short Term Goal #2 (0-30 days)  Patient will complete full prescription of antibiotic   [facility]N CM Short Term Goal #2 Start Date  07/24/15   Interventions for Short Term Goal #2  Educated patient regarding the importance completing entire prescription as prescribed to fully treat infections, educated importance of notifying MD on increase in sign/symptoms of uriinary tract infection     Joylene Draft, RN, [place] Management [phone]- Mobile [phone]- [place]

## 2015-07-24 NOTE — Telephone Encounter (Signed)
Verbal order for PT okay.

## 2015-07-25 ENCOUNTER — Encounter: Payer: Self-pay | Admitting: *Deleted

## 2015-07-29 ENCOUNTER — Ambulatory Visit (INDEPENDENT_AMBULATORY_CARE_PROVIDER_SITE_OTHER): Payer: PPO | Admitting: Family Medicine

## 2015-07-29 ENCOUNTER — Other Ambulatory Visit: Payer: Self-pay | Admitting: Family Medicine

## 2015-07-29 ENCOUNTER — Encounter: Payer: Self-pay | Admitting: Family Medicine

## 2015-07-29 VITALS — BP 122/60 | HR 60 | Temp 98.1°F | Resp 16

## 2015-07-29 DIAGNOSIS — S0101XA Laceration without foreign body of scalp, initial encounter: Secondary | ICD-10-CM

## 2015-07-29 DIAGNOSIS — I10 Essential (primary) hypertension: Secondary | ICD-10-CM

## 2015-07-29 DIAGNOSIS — W19XXXA Unspecified fall, initial encounter: Secondary | ICD-10-CM | POA: Insufficient documentation

## 2015-07-29 DIAGNOSIS — W19XXXD Unspecified fall, subsequent encounter: Secondary | ICD-10-CM

## 2015-07-29 DIAGNOSIS — N390 Urinary tract infection, site not specified: Secondary | ICD-10-CM

## 2015-07-29 DIAGNOSIS — S0101XD Laceration without foreign body of scalp, subsequent encounter: Secondary | ICD-10-CM

## 2015-07-29 DIAGNOSIS — B9620 Unspecified Escherichia coli [E. coli] as the cause of diseases classified elsewhere: Secondary | ICD-10-CM | POA: Diagnosis not present

## 2015-07-29 HISTORY — DX: Laceration without foreign body of scalp, initial encounter: S01.01XA

## 2015-07-29 HISTORY — DX: Unspecified fall, initial encounter: W19.XXXA

## 2015-07-29 LAB — POCT URINALYSIS DIPSTICK
Blood, UA: NEGATIVE
Glucose, UA: NEGATIVE
Ketones, UA: NEGATIVE
Nitrite, UA: POSITIVE
Protein, UA: NEGATIVE
Spec Grav, UA: 1.02
Urobilinogen, UA: 0.2
pH, UA: 6

## 2015-07-29 MED ORDER — ENALAPRIL MALEATE 10 MG PO TABS: 10.0000 mg | ORAL_TABLET | Freq: Every day | ORAL | Status: DC

## 2015-07-29 NOTE — Telephone Encounter (Signed)
Pt stated that [facility] didn't receive the RX for enalapril (VASOTEC) 10 MG tablet. Pt would like it sent asap b/c they are at the pharmacy waiting. Thanks TNP

## 2015-07-29 NOTE — Progress Notes (Signed)
Subjective:    Patient ID: [name], female    DOB: February 13, 1925, 82 y.o.   MRN: [id number]  HPI   Follow up Hospitalization  Patient was admitted to [facility] on 07/21/2015 and discharged on 07/23/2015. She was treated for fall, head injury, head laceration, dizziness, HTN, UTI. Treatment for this included 7 staples. Pt needs these removed today. Pt was prescribed Metoprolol 25 mg 1/2 tab po BID. Urine cx showed E. Coli infection. Was treated with Cipro. Is resistant.  She reports excellent compliance with treatment. She reports this condition is Improved.  ------------------------------------------------------------------------------------   Review of Systems  Constitutional: Positive for activity change and fatigue. Negative for fever, chills, diaphoresis and appetite change.  Respiratory: Negative for cough and shortness of breath.   Cardiovascular: Positive for leg swelling. Negative for chest pain and palpitations.  Skin: Positive for wound.  Neurological: Negative for dizziness.   BP 122/60 mmHg  Pulse 60  Temp(Src) 98.1 F (36.7 C) (Oral)  Resp 16   Patient Active Problem List   Diagnosis Date Noted  . Near syncope 07/21/2015  . Need for vaccination with 13-polyvalent pneumococcal conjugate vaccine 03/13/2015  . Foot pain 12/31/2014  . Anxiety 10/17/2014  . Allergic rhinitis 10/16/2014  . Airway hyperreactivity 10/16/2014  . Biliary calculi, common bile duct 10/16/2014  . Clinical depression 10/16/2014  . 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 Clicking shoulder [id number]  . Essential (primary) hypertension 10/16/2014  . H/O gastrointestinal hemorrhage 10/16/2014  . 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 Below normal amount of sodium in the blood 10/16/2014  . Insomnia 10/16/2014  . Bad memory 10/16/2014  . OP (osteoporosis) 10/16/2014  . Cyst of pancreas 10/16/2014  . Temporary cerebral vascular dysfunction 10/16/2014  . Other constipation 03/07/2014  .  Right groin pain 03/07/2014  . Internal hemorrhoid 03/07/2014  . Gallstones, common bile duct 08/29/2013  . Cholelithiasis 08/29/2013  . CYST AND PSEUDOCYST OF PANCREAS 03/12/2008   Past Medical History  Diagnosis Date  . Hypertension   . Diverticulitis   . Gallstones   . Breast cyst     removed  . Low sodium levels    Current Outpatient Prescriptions on File Prior to Visit  Medication Sig  . acetaminophen (TYLENOL) 325 MG tablet Take 2 tablets (650 mg total) by mouth every 6 (six) hours as needed for mild pain (or Fever >/= 101).  . enalapril (VASOTEC) 10 MG tablet Take 1 tablet (10 mg total) by mouth daily.  Marland Kitchen loratadine (ALLERGY RELIEF) 10 MG tablet Take 10 mg by mouth daily as needed for allergies.  . metoprolol tartrate (LOPRESSOR) 25 MG tablet Take 0.5 tablets (12.5 mg total) by mouth 2 (two) times daily.  . traZODone (DESYREL) 100 MG tablet Take 1 tablet (100 mg total) by mouth at bedtime. (Dose Change.)  . oxyCODONE (OXY IR/ROXICODONE) 5 MG immediate release tablet Take 1 tablet (5 mg total) by mouth every 4 (four) hours as needed for moderate pain. (Patient not taking: Reported on 07/29/2015)   No current facility-administered medications on file prior to visit.   Allergies  Allergen Reactions  . Accolate  [Zafirlukast]   . Metronidazole   . Nitrofurantoin Monohyd Macro Other (See Comments)    GI upset  . Penicillins Diarrhea  . Keflex [Cephalexin] Rash   Past Surgical History  Procedure Laterality Date  . Bladder tack  1990's  . Appendectomy  1940's  . Abdominal hysterectomy  2000  . Breast cyst incision and drainage Left 1990's  .  Ercp w/ sphicterotomy  09/12/2013    Recurrent stone identified post cholecystectomy.  . Cholecystectomy  09/11/13    Cholangiogram suggested a retained stone ductal dilatation.  . Nasal sinus surgery     Social History   Social History  . Marital Status: Widowed    Spouse Name: N/A  . Number of Children: N/A  . Years of  Education: N/A   Occupational History  . Not on file.   Social History Main Topics  . Smoking status: Never Smoker   . Smokeless tobacco: Never Used  . Alcohol Use: No  . Drug Use: No  . Sexual Activity: Not on file   Other Topics Concern  . Not on file   Social History Narrative   Family History  Problem Relation Age of Onset  . Hypertension Father   . CAD Father   . Cancer Brother     unknown type       Objective:   Physical Exam  Constitutional: She is oriented to person, place, and time. She appears well-developed and well-nourished.  Neurological: She is alert and oriented to person, place, and time.  Skin:  Well healing scalp laceration. Removed 6 staples, 7 per report but unable to find 7. Did scrub scalp and did not find other staple.  Wound did open with staple removal. No bleeding or erythema. Patient tolerated procedure well.   Psychiatric: She has a normal mood and affect. Her behavior is normal. Judgment and thought content normal.                                          BP 122/60 mmHg  Pulse 60  Temp(Src) 98.1 F (36.7 C) (Oral)  Resp 16     Assessment & Plan:  1. Essential (primary) hypertension At goal. Concerning that  May get too low. Patient to continue metoprolol for now. Stop if any dizziness or weakness.    2. E. coli UTI Treated but was resistant to Cipro. Does feel better. Will resend culture. May have cleared on it's own.   - POCT urinalysis dipstick - Urine culture - Urinalysis, Routine w reflex microscopic (not at [facility])  3. Scalp laceration, subsequent encounter Removed staples as above. Recheck in 3 days.   4. Fall, subsequent encounter In wheelchair today. Continue with walker as prescribed by the hospital PT.  Recheck in 3 days.    Patient was seen and examined by Jerrell Belfast, MD, and note scribed by Renaldo Fiddler, CMA. I have reviewed the document for accuracy and completeness and I agree with above. Jerrell Belfast,  MD

## 2015-07-29 NOTE — Telephone Encounter (Signed)
RX was printed instead of e-scribed. RX sent to [facility].

## 2015-07-30 DIAGNOSIS — I10 Essential (primary) hypertension: Secondary | ICD-10-CM

## 2015-07-30 DIAGNOSIS — R42 Dizziness and giddiness: Secondary | ICD-10-CM

## 2015-07-30 DIAGNOSIS — N390 Urinary tract infection, site not specified: Secondary | ICD-10-CM

## 2015-07-30 DIAGNOSIS — R2689 Other abnormalities of gait and mobility: Secondary | ICD-10-CM

## 2015-07-30 LAB — MICROSCOPIC EXAMINATION
Casts: NONE SEEN /lpf
Epithelial Cells (non renal): NONE SEEN /hpf (ref 0–10)

## 2015-07-30 LAB — URINALYSIS, ROUTINE W REFLEX MICROSCOPIC
Bilirubin, UA: NEGATIVE
Glucose, UA: NEGATIVE
Ketones, UA: NEGATIVE
Nitrite, UA: POSITIVE — AB
Protein, UA: NEGATIVE
RBC, UA: NEGATIVE
Specific Gravity, UA: 1.018 (ref 1.005–1.030)
Urobilinogen, Ur: 0.2 mg/dL (ref 0.2–1.0)
pH, UA: 5.5 (ref 5.0–7.5)

## 2015-07-31 ENCOUNTER — Ambulatory Visit: Payer: Self-pay | Admitting: *Deleted

## 2015-07-31 ENCOUNTER — Other Ambulatory Visit: Payer: Self-pay

## 2015-07-31 NOTE — Patient Outreach (Signed)
Transition of care call: Placed call to patient who answered the phone. Patient reports that she is doing well. States that she saw her primary MD this week and is going back tomorrow for a recheck. Reports some of her staples were removed.  Denies any falls since discharged home from hospital. Reports no problems with medications. States that she has transportation to her appointments. Reports her only problem is getting her hair washed.    PLAN: Reminded patient for pending home visit with Landis Martins for 08/06/2015. Patient unsure of time. Will request Landis Martins call patient prior to home visit.  Tomasa Rand, RN, BSN, CEN [facility] [employer] [phone]

## 2015-08-01 ENCOUNTER — Telehealth: Payer: Self-pay | Admitting: Family Medicine

## 2015-08-01 ENCOUNTER — Ambulatory Visit (INDEPENDENT_AMBULATORY_CARE_PROVIDER_SITE_OTHER): Payer: Commercial Managed Care - HMO | Admitting: Family Medicine

## 2015-08-01 VITALS — BP 126/66 | HR 60 | Temp 97.7°F | Resp 16

## 2015-08-01 DIAGNOSIS — S0101XD Laceration without foreign body of scalp, subsequent encounter: Secondary | ICD-10-CM | POA: Diagnosis not present

## 2015-08-01 LAB — URINE CULTURE

## 2015-08-01 NOTE — Progress Notes (Signed)
Patient ID: [name], female   DOB: Jul 08, 1924, 82 y.o.   MRN: [id number]         [name]    DOB: [birth year]   82 y.o.   MRN: [id number] Visit Date: 08/01/2015  Today's Provider: Margarita Rana, MD   Chief Complaint  Patient presents with  . Laceration    Pt reports having 7 staples / however according to the ER Report there was only 6 staples.    Subjective:    Laceration  The incident occurred more than 1 week ago (Pt reports there are seven stables in her scalp; however the ER reports only 6 stables. ). The laceration is located on the scalp. The laceration is 4 cm in size. The pain is at a severity of 0/10. She reports no foreign bodies present.  Urinary Tract Infection  The problem has been unchanged (Pt was in the ER 07/21/2015 secondary to a fall. ). The pain is at a severity of 0/10. The patient is experiencing no pain (Pt reports not having any urinary symptoms.  ). There has been no fever. Pertinent negatives include no frequency, hematuria or urgency.       Allergies  Allergen Reactions  . Accolate  [Zafirlukast]   . Metronidazole   . Nitrofurantoin Monohyd Macro Other (See Comments)    GI upset  . Penicillins Diarrhea  . Keflex [Cephalexin] Rash   Previous Medications   ACETAMINOPHEN (TYLENOL) 325 MG TABLET    Take 2 tablets (650 mg total) by mouth every 6 (six) hours as needed for mild pain (or Fever >/= 101).   ENALAPRIL (VASOTEC) 10 MG TABLET    Take 1 tablet (10 mg total) by mouth daily.   LORATADINE (ALLERGY RELIEF) 10 MG TABLET    Take 10 mg by mouth daily as needed for allergies.   METOPROLOL TARTRATE (LOPRESSOR) 25 MG TABLET    Take 0.5 tablets (12.5 mg total) by mouth 2 (two) times daily.   TRAZODONE (DESYREL) 100 MG TABLET    Take 1 tablet (100 mg total) by mouth at bedtime. (Dose Change.)    Review of Systems  Constitutional: Negative.   Gastrointestinal: Negative.   Genitourinary: Negative for dysuria, urgency, frequency,  hematuria, decreased urine volume, vaginal bleeding, vaginal discharge, difficulty urinating, vaginal pain and dyspareunia.  Musculoskeletal: Negative.   Skin: Negative.        Laceration in her scalp.     Social History  Substance Use Topics  . Smoking status: Never Smoker   . Smokeless tobacco: Never Used  . Alcohol Use: No   Objective:   BP 126/66 mmHg  Pulse 60  Temp(Src) 97.7 F (36.5 C) (Oral)  Resp 16  Wt   Physical Exam  Constitutional: She is oriented to person, place, and time. She appears well-developed and well-nourished.  Neurological: She is alert and oriented to person, place, and time.  Skin: Skin is warm and dry. Laceration (Well healing laceration on the right side of pt's scalp.) noted.  Psychiatric: She has a normal mood and affect. Her behavior is normal. Judgment and thought content normal.      Assessment & Plan:      1. Scalp laceration, subsequent encounter Improved. No further staples found. No signs of infection. Ok to wash scalp.      Patient was seen and examined by Jerrell Belfast, MD, and note scribed by Ashley Royalty, CMA. I have reviewed the document for accuracy and completeness and I  agree with above. - Jerrell Belfast, MD   Margarita Rana, MD  [facility]

## 2015-08-01 NOTE — Telephone Encounter (Signed)
Ben from [facility] wanted to let you know that pt had PT this Wed but will miss an appointment due to having doctor appointments.Call back [phone]

## 2015-08-02 NOTE — Telephone Encounter (Signed)
-----   Message from Margarita Rana, MD sent at 08/01/2015  5:08 PM EDT ----- Does have UTI. Trial of Nitrofurantoin 100 mg bid for 7 days. Thanks.

## 2015-08-02 NOTE — Telephone Encounter (Signed)
LMTCB 08/02/2015  Thanks,   -Laura  

## 2015-08-03 ENCOUNTER — Encounter: Payer: Self-pay | Admitting: Family Medicine

## 2015-08-03 MED ORDER — NITROFURANTOIN MONOHYD MACRO 100 MG PO CAPS: 100.0000 mg | ORAL_CAPSULE | Freq: Two times a day (BID) | ORAL | Status: DC

## 2015-08-03 NOTE — Telephone Encounter (Signed)
Patient advised as below.  

## 2015-08-05 ENCOUNTER — Other Ambulatory Visit: Payer: Self-pay | Admitting: *Deleted

## 2015-08-05 NOTE — Patient Outreach (Signed)
[place] [facility]) Care Management  08/05/2015  [name] 1924/11/01 [id number]   Transition of care call,  RNCM placed call to patient to remind her of her my scheduled home visit on 4/25, patient then informs me that she will not be at home due to she has a hair appointment now that she has all of the staples from her scalp. Transition of care weekly call completed at this telephone visit.  [name] reports she is making good progress with therapy, she has practiced with her walker and cane and has been  advised by therapist that is may be best in the rain, tomorrow to use her cane. [name] denies fall,  dizziness or discomfort, reports she is able to due more around the house,she and her son working together.  [name] states she got a phone call regarding recent urine specimen that her infection has not cleared and she was started on another medication ,patient  unable to state the name of the medication, noted Macrobid is on her medication list, encouraged patient to make sure she takes her medication as prescribed, drink plenty of fluids, nutritious diet, change her incontinence pad frequently.  Patient denies any further concerns.   Plan Patient will remain active with transition of care program. RNCM has rescheduled  home visit in the next week with patient  [facility] CM Care Plan Problem One        Most Recent Value   Care Plan Problem One  Recent hospital admission   Role Documenting the Problem One  Care Management [place] for Problem One  Active   THN Long Term Goal (31-90 days)  Patient will not experience a hospital admission in the next 31 days   THN Long Term Goal Start Date  07/24/15   Interventions for Problem One Long Term Goal  Reviewed with  patient to call MD for needs. Reviewed importance of taking all meds as prescribed.   THN CM Short Term Goal #1 (0-30 days)  Patient will attend PCP appointment in the next 14 days   THN CM Short  Term Goal #1 Start Date  07/24/15   [facility] CM Short Term Goal #1 Met Date  07/31/15 [goal met]   THN CM Short Term Goal #2 (0-30 days)  Patient will complete full prescription of antibiotic   THN CM Short Term Goal #2 Start Date  07/24/15   [facility] CM Short Term Goal #2 Met Date  07/31/15 [goal met]   THN CM Short Term Goal #3 (0-30 days)  Patient will report using her cane or walker at all times in the next 30 days   THN CM Short Term Goal #3 Start Date  08/05/15   Interventions for Short Tern Goal #3  Educated patient in the importance of using assist device at all times, for fall prevention      Joylene Draft, RN, [place] Management [phone]- Mobile [phone]- [place] .

## 2015-08-06 ENCOUNTER — Encounter: Payer: Self-pay | Admitting: *Deleted

## 2015-08-12 ENCOUNTER — Other Ambulatory Visit: Payer: Self-pay | Admitting: *Deleted

## 2015-08-12 ENCOUNTER — Encounter: Payer: Self-pay | Admitting: *Deleted

## 2015-08-12 NOTE — Patient Outreach (Addendum)
[place] [facility]) Care Management   08/12/2015  [name] [birth year] [id number]  Initial Home visit. [name] is an 82 y.o. female  Subjective: " I am feeling much better , I didn't realize a urinary tract infection could make you feel that way, I didn't even realize that I had one. Patient discussed her physical therapy sessions, and discussed she is participating and feeling stronger .  Objective:  BP 126/70 mmHg  Pulse 60  Resp 18  SpO2 97% Review of Systems  Constitutional: Negative.   HENT: Negative.   Eyes: Negative.   Respiratory: Negative.   Cardiovascular: Negative.   Gastrointestinal: Negative.   Genitourinary: Negative.   Musculoskeletal: Negative for falls.  Skin: Negative.   Neurological: Negative.   Endo/Heme/Allergies: Negative.   Psychiatric/Behavioral: Negative.     Physical Exam  Constitutional: She is oriented to person, place, and time. She appears well-developed and well-nourished.  Cardiovascular: Normal rate, normal heart sounds and intact distal pulses.   Respiratory: Effort normal and breath sounds normal.  GI: Soft.  Neurological: She is alert and oriented to person, place, and time.  Skin: Skin is warm and dry.  Psychiatric: She has a normal mood and affect. Her behavior is normal. Judgment and thought content normal.    Encounter Medications:   Outpatient Encounter Prescriptions as of 08/12/2015  Medication Sig  . acetaminophen (TYLENOL) 325 MG tablet Take 2 tablets (650 mg total) by mouth every 6 (six) hours as needed for mild pain (or Fever >/= 101).  . enalapril (VASOTEC) 10 MG tablet Take 1 tablet (10 mg total) by mouth daily.  Marland Kitchen loratadine (ALLERGY RELIEF) 10 MG tablet Take 10 mg by mouth daily as needed for allergies.  . metoprolol tartrate (LOPRESSOR) 25 MG tablet Take 0.5 tablets (12.5 mg total) by mouth 2 (two) times daily.  . nitrofurantoin, macrocrystal-monohydrate, (MACROBID) 100 MG capsule Take 1 capsule (100  mg total) by mouth 2 (two) times daily.  . traZODone (DESYREL) 100 MG tablet Take 1 tablet (100 mg total) by mouth at bedtime. (Dose Change.)   No facility-administered encounter medications on file as of 08/12/2015.    Functional Status:   In your present state of health, do you have any difficulty performing the following activities: 07/24/2015 07/21/2015  Hearing? Tempie Donning  Vision? N N  Difficulty concentrating or making decisions? N N  Walking or climbing stairs? Y Y  Dressing or bathing? N N  Doing errands, shopping? N N  Preparing Food and eating ? Y -  Using the Toilet? N -  In the past six months, have you accidently leaked urine? Y -  Do you have problems with loss of bowel control? N -  Managing your Medications? N -  Managing your Finances? N -  Housekeeping or managing your Housekeeping? Y -    Fall/Depression Screening:    PHQ 2/9 Scores 07/24/2015 03/13/2015  PHQ - 2 Score 0 1    Assessment:    Recent Urinary tract infection Patient denies symptoms of urinary burning, frequency, urine odor or increase in weakness, continuing to take her macrobid as prescribed.   Hypertension Patient continues to take medication as prescribed, denies any symptoms of hypotension , no increased weakness and denies dizziness. Patient has declined blood pressure monitor for self monitoring her blood pressure at this time, she understands the importance of notifying MD of symptoms sooner.  Recent Fall Patient continues to be followed by home health physical therapy, she reports making  good progress and continues to use her cane mostly. Patient is able to completed her normal daily activities, resting in between.  Recent Scalp Laceration Staples have been removed, patient has visited beauty shop since then, and he hair is in pin curls this am.She denies any discomfort at present.  Mrs.Abbett denies any new concerns at this time.   Plan:  Patient will continue with weekly phone call on next  week as part of the transition of care program. Patient will seek help of her niece to assist with completing living will form and getting it notarized, she has declined need for [facility] assistance as offered.  [facility] CM Care Plan Problem One        Most Recent Value   Care Plan Problem One  Recent hospital admission   Role Documenting the Problem One  Care Management [place] for Problem One  Active   THN Long Term Goal (31-90 days)  Patient will not experience a hospital admission in the next 31 days   THN Long Term Goal Start Date  07/24/15   Interventions for Problem One Long Term Goal  Reinforced with patient to notify MD for new symptoms, provided and reviewed emmi handouts on hypertension what to do, preventing falls    THN CM Short Term Goal #1 (0-30 days)  Patient will attend PCP appointment in the next 14 days   THN CM Short Term Goal #1 Start Date  07/24/15   [facility] CM Short Term Goal #1 Met Date  07/31/15 [goal met]   THN CM Short Term Goal #2 (0-30 days)  Patient will complete full prescription of antibiotic   THN CM Short Term Goal #2 Start Date  07/24/15   [facility] CM Short Term Goal #2 Met Date  07/31/15 [goal met]   THN CM Short Term Goal #3 (0-30 days)  Patient will report using her cane or walker at all times in the next 30 days   THN CM Short Term Goal #3 Start Date  08/05/15   [facility] CM Short Term Goal #3 Met Date  08/12/15   THN CM Short Term Goal #4 (0-30 days)  Patient will be able to state at least 3 symptoms of concern to notify MD of in the next 30 days   THN CM Short Term Goal #4 Start Date  08/12/15   Interventions for Short Term Goal #4  Educated on symptoms of concern to notify MD of , increased dizziness, weakness, signs of infection fever,provided and reviewed emmi handout on urinary tract infection , self care      Joylene Draft, RN, [place] Care Management [phone]- Mobile [phone]- [place]

## 2015-08-14 DIAGNOSIS — Z9181 History of falling: Secondary | ICD-10-CM | POA: Diagnosis not present

## 2015-08-14 DIAGNOSIS — R42 Dizziness and giddiness: Secondary | ICD-10-CM | POA: Diagnosis not present

## 2015-08-14 DIAGNOSIS — M6281 Muscle weakness (generalized): Secondary | ICD-10-CM | POA: Diagnosis not present

## 2015-08-14 DIAGNOSIS — N390 Urinary tract infection, site not specified: Secondary | ICD-10-CM | POA: Diagnosis not present

## 2015-08-14 DIAGNOSIS — I10 Essential (primary) hypertension: Secondary | ICD-10-CM | POA: Diagnosis not present

## 2015-08-14 DIAGNOSIS — R2689 Other abnormalities of gait and mobility: Secondary | ICD-10-CM | POA: Diagnosis not present

## 2015-08-19 ENCOUNTER — Other Ambulatory Visit: Payer: Self-pay | Admitting: *Deleted

## 2015-08-19 NOTE — Patient Outreach (Signed)
[place] [facility]) Care Management  08/19/2015  [name] Mar 09, 1925 [id number]   Transition of care call  RN placed call to patient , [name] reports that she is doing good on today, she discussed her visit with physical therapy on today,her goals have been met and she has completed her sessions and today is her last home physical therapy visit.  Patient reports that her blood pressure was good per therapist report this am she is unable to recall reading. [name] continues to use her cane, denies any dizziness episode and no falls.  Patient discussed that she has a visit with PCP on this week for follow up on urinary tract infections, she denies any symptoms. Reports tolerating her diet without problems .   Care management goals are being met discussed case closure at end of transition of care final call on next week. Patient awaiting Advanced directive handout from [facility] in the mail, will follow up.    Plan RNCM will plan final transition of care call on next week, and will close case if no further care management concerns patient in agreement. RNCM will follow up to make sure Advanced directive booklet has been mailed out.   [facility] CM Care Plan Problem One        Most Recent Value   Care Plan Problem One  Recent hospital admission   Role Documenting the Problem One  Care Management [place] for Problem One  Active   THN Long Term Goal (31-90 days)  Patient will not experience a hospital admission in the next 31 days   THN Long Term Goal Start Date  07/24/15   Interventions for Problem One Long Term Goal  Reviewed with patient to notify PCP sooner of any new or reoccuring concerns   THN CM Short Term Goal #1 (0-30 days)  Patient will attend PCP appointment in the next 14 days   THN CM Short Term Goal #1 Start Date  07/24/15   [facility] CM Short Term Goal #1 Met Date  07/31/15 [goal met]   THN CM Short Term Goal #2 (0-30 days)  Patient will complete full  prescription of antibiotic   THN CM Short Term Goal #2 Start Date  07/24/15   [facility] CM Short Term Goal #2 Met Date  07/31/15 [goal met]   THN CM Short Term Goal #3 (0-30 days)  Patient will report using her cane or walker at all times in the next 30 days   THN CM Short Term Goal #3 Start Date  08/05/15   [facility] CM Short Term Goal #3 Met Date  08/12/15   THN CM Short Term Goal #4 (0-30 days)  Patient will be able to state at least 3 symptoms of concern to notify MD of in the next 30 days   THN CM Short Term Goal #4 Start Date  08/12/15   [facility] CM Short Term Goal #4 Met Date  08/19/15     Joylene Draft, RN, [place] Care Management [phone]- Mobile [phone]- [place]

## 2015-08-21 ENCOUNTER — Ambulatory Visit (INDEPENDENT_AMBULATORY_CARE_PROVIDER_SITE_OTHER): Payer: PPO | Admitting: Family Medicine

## 2015-08-21 ENCOUNTER — Encounter: Payer: Self-pay | Admitting: Family Medicine

## 2015-08-21 VITALS — BP 126/68 | HR 60 | Temp 97.9°F | Resp 16 | Wt 124.0 lb

## 2015-08-21 DIAGNOSIS — N390 Urinary tract infection, site not specified: Secondary | ICD-10-CM | POA: Diagnosis not present

## 2015-08-21 DIAGNOSIS — I10 Essential (primary) hypertension: Secondary | ICD-10-CM

## 2015-08-21 HISTORY — DX: Urinary tract infection, site not specified: N39.0

## 2015-08-21 LAB — POCT URINALYSIS DIPSTICK
Bilirubin, UA: NEGATIVE
Blood, UA: NEGATIVE
Glucose, UA: NEGATIVE
Ketones, UA: NEGATIVE
Leukocytes, UA: NEGATIVE
Nitrite, UA: NEGATIVE
Protein, UA: NEGATIVE
Spec Grav, UA: 1.01
Urobilinogen, UA: 0.2
pH, UA: 7.5

## 2015-08-21 MED ORDER — METOPROLOL TARTRATE 25 MG PO TABS: 12.5000 mg | ORAL_TABLET | Freq: Two times a day (BID) | ORAL | Status: DC

## 2015-08-21 NOTE — Progress Notes (Signed)
Patient ID: [name], female   DOB: Jan 11, 1925, 82 y.o.   MRN: [id number]        [name]    DOB: 1924/08/05   82 y.o.   MRN: [id number] Visit Date: 08/21/2015  Today's Provider: Margarita Rana, MD   Chief Complaint  Patient presents with  . Hypertension  . Urinary Tract Infection   Subjective:    Urinary Tract Infection  This is a new problem. The current episode started 1 to 4 weeks ago. The problem has been resolved. The patient is experiencing no pain. Pertinent negatives include no discharge, flank pain, frequency, hematuria, nausea, urgency or vomiting.  Pt finished her round of antibiotics.  She says she is feeling much better, but wants to make sure her infection in gone.     Hypertension, follow-up:  BP Readings from Last 3 Encounters:  08/21/15 126/68  08/12/15 126/70  08/01/15 126/66    She was last seen for hypertension 3 weeks ago.  Management since that visit includes None .She reports excellent compliance with treatment. She is not having side effects.  She is exercising. She is adherent to low salt diet.   Outside blood pressures are: Pt reports her physical therapist has been checking her blood pressure; she says it usually runs around 130's/70-80's. She is experiencing lower extremity edema.  She says her swelling gets better when she props her legs up. Patient denies chest pain, dyspnea, fatigue, irregular heart beat and palpitations.   Cardiovascular risk factors include advanced age (older than 3 for men, 61 for women) and hypertension.     ------------------------------------------------------------------------    Allergies  Allergen Reactions  . Accolate  [Zafirlukast]   . Metronidazole   . Nitrofurantoin Monohyd Macro Other (See Comments)    GI upset  . Penicillins Diarrhea  . Keflex [Cephalexin] Rash   Previous Medications   ACETAMINOPHEN (TYLENOL) 325 MG TABLET    Take 2 tablets (650 mg total) by mouth every 6  (six) hours as needed for mild pain (or Fever >/= 101).   ENALAPRIL (VASOTEC) 10 MG TABLET    Take 1 tablet (10 mg total) by mouth daily.   LORATADINE (ALLERGY RELIEF) 10 MG TABLET    Take 10 mg by mouth daily as needed for allergies.   METOPROLOL TARTRATE (LOPRESSOR) 25 MG TABLET    Take 0.5 tablets (12.5 mg total) by mouth 2 (two) times daily.   NITROFURANTOIN, MACROCRYSTAL-MONOHYDRATE, (MACROBID) 100 MG CAPSULE    Take 1 capsule (100 mg total) by mouth 2 (two) times daily.   TRAZODONE (DESYREL) 100 MG TABLET    Take 1 tablet (100 mg total) by mouth at bedtime. (Dose Change.)    Review of Systems  Constitutional: Negative.   Respiratory: Negative.   Cardiovascular: Positive for leg swelling. Negative for chest pain and palpitations.  Gastrointestinal: Negative.  Negative for nausea and vomiting.  Genitourinary: Negative.  Negative for dysuria, urgency, frequency, hematuria, flank pain, decreased urine volume, vaginal bleeding, vaginal discharge, difficulty urinating, vaginal pain and dyspareunia.  Neurological: Negative for dizziness, light-headedness and headaches.    Social History  Substance Use Topics  . Smoking status: Never Smoker   . Smokeless tobacco: Never Used  . Alcohol Use: No   Objective:   BP 126/68 mmHg  Pulse 60  Temp(Src) 97.9 F (36.6 C) (Oral)  Resp 16  Wt 124 lb (56.246 kg)  Physical Exam  Constitutional: She is oriented to person, place, and time. She appears well-developed  and well-nourished.  Cardiovascular: Normal rate and regular rhythm.   Pulmonary/Chest: Effort normal and breath sounds normal.  Neurological: She is alert and oriented to person, place, and time.  Skin: Skin is warm and dry.  Well healing scalp laceration.   Psychiatric: She has a normal mood and affect. Her behavior is normal. Judgment and thought content normal.        Assessment & Plan:     1. Essential (primary) hypertension Stable; continue current medications.   Refills  provided.  Will recheck in September with Simona Huh.   2. Acute UTI Resolved.  - POCT urinalysis dipstick   Patient was seen and examined by Jerrell Belfast, MD, and note scribed by Ashley Royalty, [place].   I have reviewed the document for accuracy and completeness and I agree with above. - Jerrell Belfast, MD        Margarita Rana, MD  [facility]

## 2015-08-28 ENCOUNTER — Encounter: Payer: Self-pay | Admitting: *Deleted

## 2015-08-28 ENCOUNTER — Other Ambulatory Visit: Payer: Self-pay | Admitting: *Deleted

## 2015-08-28 NOTE — Patient Outreach (Signed)
[place] [facility]) Care Management  08/28/2015  [name] 15-Apr-1924 [id number]   Transition of care final call.  RN placed call to [name] patient reports she is doing fine, she has already walked to the mailbox 2 times this morning. She continue to use her cane denies dizziness.  Patient denies any symptoms of urinary tract infection and discussed her recent office visit her urine test was clear.  [name] has declined wanting to self monitor her blood pressure at home, she states she takes her medication just like she is supposed to and watches the salt in her diet.  [name] verifies that she has received the Advanced directive packet and her niece will assist her with completing.  Patient denies any further care management needs or concerns  at this time. [name] has successfully completed the transition of care program, and case will be closed. Patient has been provided contact information for further needs.  Plan Close case per protocol, transition of care has been successfully completed, without new care management needs identified. [facility] CM Care Plan Problem One        Most Recent Value   Care Plan Problem One  Recent hospital admission   Role Documenting the Problem One  Care Management [place] for Problem One  Active   THN Long Term Goal (31-90 days)  Patient will not experience a hospital admission in the next 31 days   THN Long Term Goal Start Date  07/24/15   [facility] Long Term Goal Met Date  08/28/15   THN CM Short Term Goal #1 (0-30 days)  Patient will attend PCP appointment in the next 14 days   THN CM Short Term Goal #1 Start Date  07/24/15   [facility] CM Short Term Goal #1 Met Date  07/31/15 [goal met]   THN CM Short Term Goal #2 (0-30 days)  Patient will complete full prescription of antibiotic   THN CM Short Term Goal #2 Start Date  07/24/15   [facility] CM Short Term Goal #2 Met Date  07/31/15 [goal met]   THN CM Short Term Goal #3 (0-30  days)  Patient will report using her cane or walker at all times in the next 30 days   THN CM Short Term Goal #3 Start Date  08/05/15   [facility] CM Short Term Goal #3 Met Date  08/12/15   THN CM Short Term Goal #4 (0-30 days)  Patient will be able to state at least 3 symptoms of concern to notify MD of in the next 30 days   THN CM Short Term Goal #4 Start Date  08/12/15   [facility] CM Short Term Goal #4 Met Date  08/19/15    Joylene Draft, RN, [place] Care Management [phone]- Mobile [phone]- [place]

## 2015-09-17 ENCOUNTER — Other Ambulatory Visit: Payer: Self-pay | Admitting: Family Medicine

## 2015-09-17 ENCOUNTER — Telehealth: Payer: Self-pay | Admitting: Family Medicine

## 2015-09-17 DIAGNOSIS — G4700 Insomnia, unspecified: Secondary | ICD-10-CM

## 2015-09-17 NOTE — Telephone Encounter (Signed)
Pt called for refill traZODone (DESYREL) 100 MG tablet  Pharmacy already requested.

## 2015-11-27 DIAGNOSIS — M79672 Pain in left foot: Secondary | ICD-10-CM | POA: Diagnosis not present

## 2015-11-27 DIAGNOSIS — M19072 Primary osteoarthritis, left ankle and foot: Secondary | ICD-10-CM | POA: Diagnosis not present

## 2016-01-07 ENCOUNTER — Ambulatory Visit: Payer: Self-pay | Admitting: Family Medicine

## 2016-01-14 ENCOUNTER — Encounter: Payer: Self-pay | Admitting: Family Medicine

## 2016-01-14 ENCOUNTER — Ambulatory Visit (INDEPENDENT_AMBULATORY_CARE_PROVIDER_SITE_OTHER): Payer: PPO | Admitting: Family Medicine

## 2016-01-14 VITALS — BP 120/82 | HR 62 | Temp 97.6°F | Resp 14 | Wt 123.0 lb

## 2016-01-14 DIAGNOSIS — M19041 Primary osteoarthritis, right hand: Secondary | ICD-10-CM | POA: Insufficient documentation

## 2016-01-14 DIAGNOSIS — M19042 Primary osteoarthritis, left hand: Secondary | ICD-10-CM | POA: Diagnosis not present

## 2016-01-14 DIAGNOSIS — I10 Essential (primary) hypertension: Secondary | ICD-10-CM | POA: Diagnosis not present

## 2016-01-14 MED ORDER — ENALAPRIL MALEATE 10 MG PO TABS: 10.0000 mg | ORAL_TABLET | Freq: Every day | ORAL | 1 refills | Status: DC

## 2016-01-14 NOTE — Progress Notes (Signed)
[name]    DOB: 1924/10/23   82 y.o.   MRN: [id number] Visit Date: 01/14/2016  Today's Provider: Vernie Murders, PA   Chief Complaint  Patient presents with  . Hypertension  . Follow-up   Subjective:    HPI  Patient is here for follow up and establish care with Simona Huh since Dr Venia Minks left. Patient is doing well on current medication regimen.   Hypertension, follow-up:  BP Readings from Last 3 Encounters:  01/14/16 120/82  08/21/15 126/68  08/12/15 126/70    She was last seen for hypertension 5 months ago.  BP at that visit was 126/68. Management changes since that visit include continue medications. She reports excellent compliance with treatment. She is not having side effects.  She is not exercising. She is adherent to low salt diet.   She is experiencing none.  Patient denies chest pain, irregular heart beat and palpitations.   Cardiovascular risk factors include advanced age (older than 79 for men, 74 for women).      Weight trend: stable Wt Readings from Last 3 Encounters:  01/14/16 123 lb (55.8 kg)  08/21/15 124 lb (56.2 kg)  07/21/15 122 lb (55.3 kg)    Current diet: low salt  ------------------------------------------------------------------------ Past Medical History:  Diagnosis Date  . Breast cyst    removed  . Diverticulitis   . Gallstones   . Hypertension   . Low sodium levels    Past Surgical History:  Procedure Laterality Date  . ABDOMINAL HYSTERECTOMY  2000  . APPENDECTOMY  1940's  . bladder tack  1990's  . BREAST CYST INCISION AND DRAINAGE Left 1990's  . CHOLECYSTECTOMY  09/11/13   Cholangiogram suggested a retained stone ductal dilatation.  Marland Kitchen ERCP W/ SPHICTEROTOMY  09/12/2013   Recurrent stone identified post cholecystectomy.  Marland Kitchen NASAL SINUS SURGERY     Family History  Problem Relation Age of Onset  . Hypertension Father   . CAD Father   . Cancer Brother     unknown type   Allergies  Allergen Reactions   . Accolate  [Zafirlukast]   . Metronidazole   . Nitrofurantoin Monohyd Macro Other (See Comments)    GI upset  . Penicillins Diarrhea  . Keflex [Cephalexin] Rash     Previous Medications   ACETAMINOPHEN (TYLENOL) 325 MG TABLET    Take 2 tablets (650 mg total) by mouth every 6 (six) hours as needed for mild pain (or Fever >/= 101).   ENALAPRIL (VASOTEC) 10 MG TABLET    Take 1 tablet (10 mg total) by mouth daily.   LORATADINE (ALLERGY RELIEF) 10 MG TABLET    Take 10 mg by mouth daily as needed for allergies.   METOPROLOL TARTRATE (LOPRESSOR) 25 MG TABLET    Take 0.5 tablets (12.5 mg total) by mouth 2 (two) times daily.   TRAZODONE (DESYREL) 100 MG TABLET    TAKE ONE TABLET BY MOUTH EVERY NIGHT AT BEDTIME    Review of Systems  Constitutional: Negative.   HENT: Negative.   Eyes: Negative.   Respiratory: Negative.   Cardiovascular: Negative.   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 Difficulty opening bottles and jars.  Neurological:       Some balance issues. Uses a cane for support.    Social History  Substance Use Topics  . Smoking status: Never Smoker  . Smokeless tobacco: Never Used  . Alcohol use No   Objective:  BP 120/82 (BP Location: Right Arm, Patient Position: Sitting, Cuff Size: Normal)   Pulse 62   Temp 97.6 F (36.4 C) (Oral)   Resp 14   Wt 123 lb (55.8 kg)   BMI 20.47 kg/m   Physical Exam  Constitutional: She is oriented to person, place, and time. She appears well-developed and well-nourished. No distress.  HENT:  Head: Normocephalic and atraumatic.  Right Ear: Hearing and external ear normal.  Left Ear: Hearing and external ear normal.  Nose: Nose normal.  Mouth/Throat: Oropharynx is clear and moist.  Eyes: Conjunctivae and lids are normal. Right eye exhibits no discharge. Left eye exhibits no discharge. No scleral icterus.  Neck: Neck supple. No thyromegaly present.  Cardiovascular: Regular rhythm.   Pulmonary/Chest:  Effort normal and breath sounds normal. No respiratory distress.  Abdominal: Soft. Bowel sounds are normal.  Musculoskeletal: She exhibits deformity.  Deviation of nearly all joints of the right hand and fingers. Severe angulation at PIP joints of both thumbs.  Lymphadenopathy:    She has no cervical adenopathy.  Neurological: She is alert and oriented to person, place, and time.  Skin: Skin is intact. No lesion and no rash noted.  Psychiatric: She has a normal mood and affect. Her speech is normal and behavior is normal. Thought content normal.      Assessment & Plan:     1. Essential (primary) hypertension Feeling well with stable BP. Tolerating Enalapril 10 mg qd and Metoprolol 25 mg qd. No chest pains, palpitations, dizziness or dyspnea. Check routine labs and follow up pending reports. - enalapril (VASOTEC) 10 MG tablet; Take 1 tablet (10 mg total) by mouth daily.  Dispense: 90 tablet; Refill: 1 - CBC with Differential/Platelet - Comprehensive metabolic panel - Lipid panel - TSH  2. Arthritis of both hands Unable to grasp much with the right hand. Son opens jars or bottles and leaves them in the refrigerator for her. Uses a cane for support/balance. Has hammer toes on both feet. Notices aches and pains but not severe. Will check labs. Uses Tylenol for pain. - CBC with Differential/Platelet

## 2016-01-15 LAB — LIPID PANEL
Chol/HDL Ratio: 2.9 ratio units (ref 0.0–4.4)
Cholesterol, Total: 177 mg/dL (ref 100–199)
HDL: 62 mg/dL (ref >39)
LDL Calculated: 89 mg/dL (ref 0–99)
Triglycerides: 128 mg/dL (ref 0–149)
VLDL Cholesterol Cal: 26 mg/dL (ref 5–40)

## 2016-01-15 LAB — CBC WITH DIFFERENTIAL/PLATELET
Basophils Absolute: 0 10*3/uL (ref 0.0–0.2)
Basos: 0 %
EOS (ABSOLUTE): 0.1 10*3/uL (ref 0.0–0.4)
Eos: 1 %
Hematocrit: 35.6 % (ref 34.0–46.6)
Hemoglobin: 12 g/dL (ref 11.1–15.9)
Immature Grans (Abs): 0 10*3/uL (ref 0.0–0.1)
Immature Granulocytes: 0 %
Lymphocytes Absolute: 1.7 10*3/uL (ref 0.7–3.1)
Lymphs: 24 %
MCH: 30.9 pg (ref 26.6–33.0)
MCHC: 33.7 g/dL (ref 31.5–35.7)
MCV: 92 fL (ref 79–97)
Monocytes Absolute: 0.6 10*3/uL (ref 0.1–0.9)
Monocytes: 8 %
Neutrophils Absolute: 4.7 10*3/uL (ref 1.4–7.0)
Neutrophils: 67 %
Platelets: 193 10*3/uL (ref 150–379)
RBC: 3.88 x10E6/uL (ref 3.77–5.28)
RDW: 12.6 % (ref 12.3–15.4)
WBC: 7.2 10*3/uL (ref 3.4–10.8)

## 2016-01-15 LAB — COMPREHENSIVE METABOLIC PANEL
ALT: 12 IU/L (ref 0–32)
AST: 25 IU/L (ref 0–40)
Albumin/Globulin Ratio: 2.1 (ref 1.2–2.2)
Albumin: 4.9 g/dL — ABNORMAL HIGH (ref 3.5–4.7)
Alkaline Phosphatase: 88 IU/L (ref 39–117)
BUN/Creatinine Ratio: 19 (ref 12–28)
BUN: 18 mg/dL (ref 8–27)
Bilirubin Total: 0.5 mg/dL (ref 0.0–1.2)
CO2: 25 mmol/L (ref 18–29)
Calcium: 10.3 mg/dL (ref 8.7–10.3)
Chloride: 95 mmol/L — ABNORMAL LOW (ref 96–106)
Creatinine, Ser: 0.93 mg/dL (ref 0.57–1.00)
GFR calc Af Amer: 63 mL/min/{1.73_m2} (ref >59)
GFR calc non Af Amer: 55 mL/min/{1.73_m2} — ABNORMAL LOW (ref >59)
Globulin, Total: 2.3 g/dL (ref 1.5–4.5)
Glucose: 95 mg/dL (ref 65–99)
Potassium: 4.5 mmol/L (ref 3.5–5.2)
Sodium: 137 mmol/L (ref 134–144)
Total Protein: 7.2 g/dL (ref 6.0–8.5)

## 2016-01-15 LAB — TSH: TSH: 2.09 u[IU]/mL (ref 0.450–4.500)

## 2016-01-20 ENCOUNTER — Telehealth: Payer: Self-pay

## 2016-01-20 NOTE — Telephone Encounter (Signed)
Patient has been advised. KW 

## 2016-01-20 NOTE — Telephone Encounter (Signed)
-----   Message from Margo Common, [place] sent at 01/17/2016  6:38 PM EDT ----- All blood tests normal with very good cholesterol levels. Recheck BP in 3 months.

## 2016-01-24 DIAGNOSIS — I10 Essential (primary) hypertension: Secondary | ICD-10-CM | POA: Diagnosis not present

## 2016-01-24 DIAGNOSIS — N390 Urinary tract infection, site not specified: Secondary | ICD-10-CM | POA: Diagnosis not present

## 2016-01-24 DIAGNOSIS — Z1389 Encounter for screening for other disorder: Secondary | ICD-10-CM | POA: Diagnosis not present

## 2016-03-04 IMAGING — CT CT HEAD WITHOUT CONTRAST
1 series · 16 of 30 positions shown, 20 images · non-contrast
Comparison: CT scan of December 09, 2012.

CLINICAL DATA: Altered mental status.

EXAM:
CT HEAD WITHOUT CONTRAST
TECHNIQUE: Contiguous axial images were obtained from the base of the skull
through the vertex without intravenous contrast.

[Series 2: head wo · axial · 0.43mm/px · z∈[-21,+112]mm · 16 of 30 slices shown, 20 images]
[im 2/30  brain]
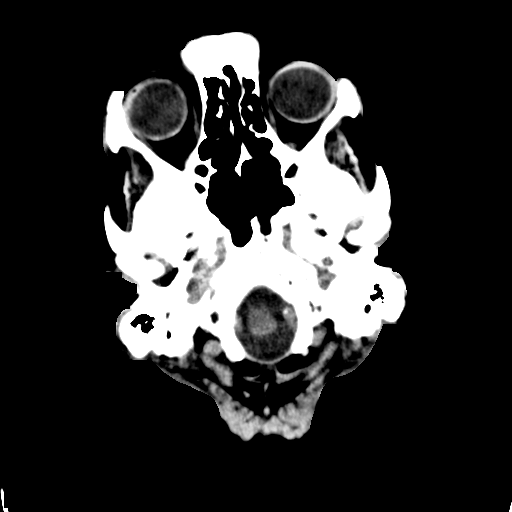
[im 2/30  bone]
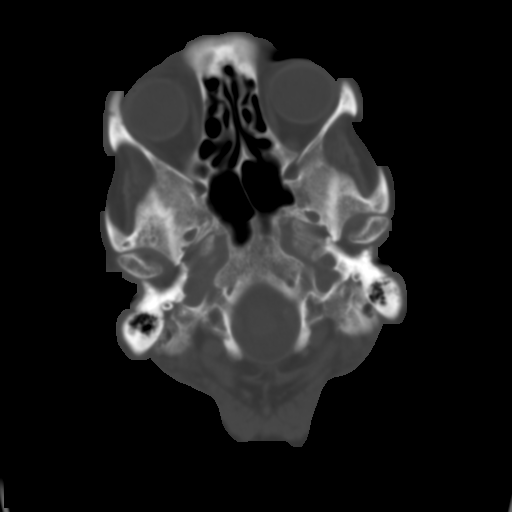
[im 4/30  brain]
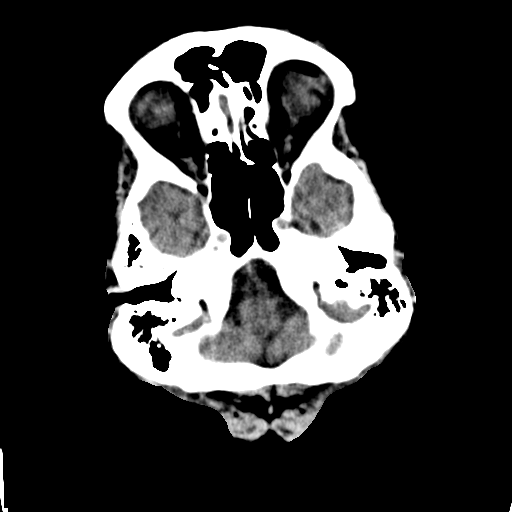
[im 6/30  brain]
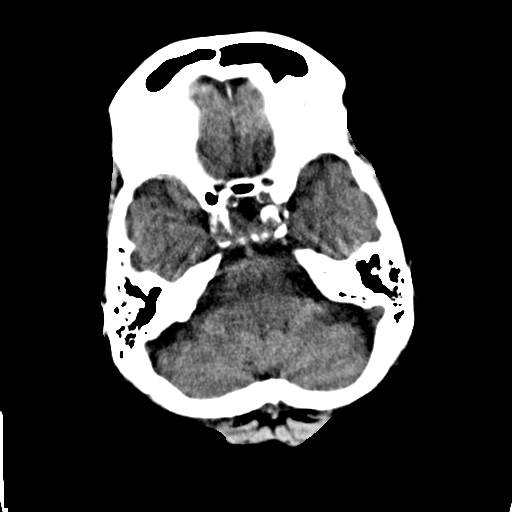
[im 8/30  brain]
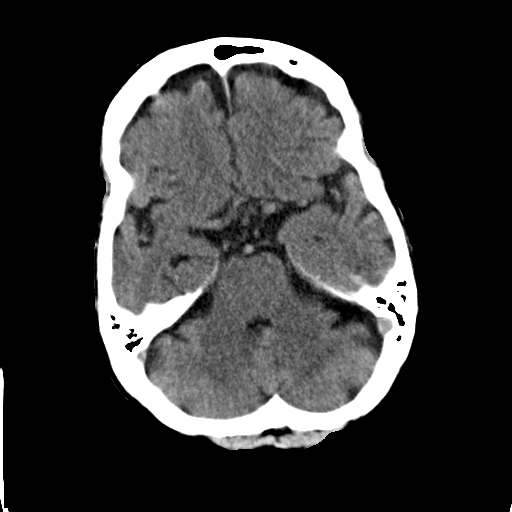
[im 9/30  brain]
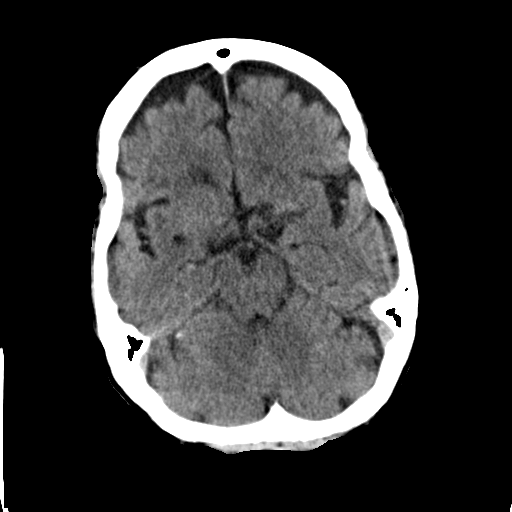
[im 9/30  bone]
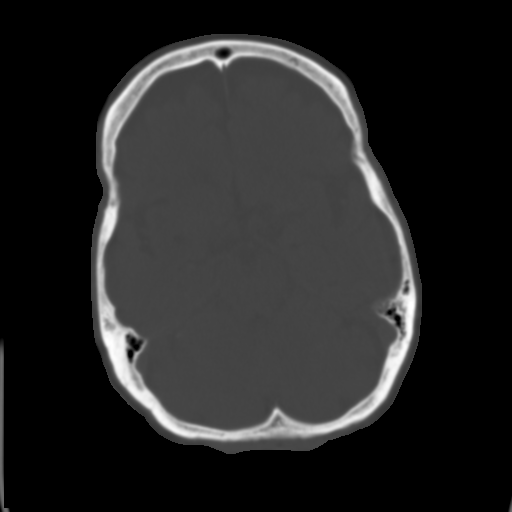
[im 11/30  brain]
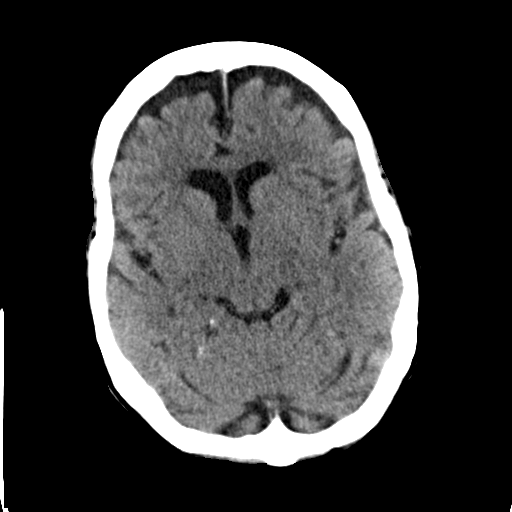
[im 13/30  brain]
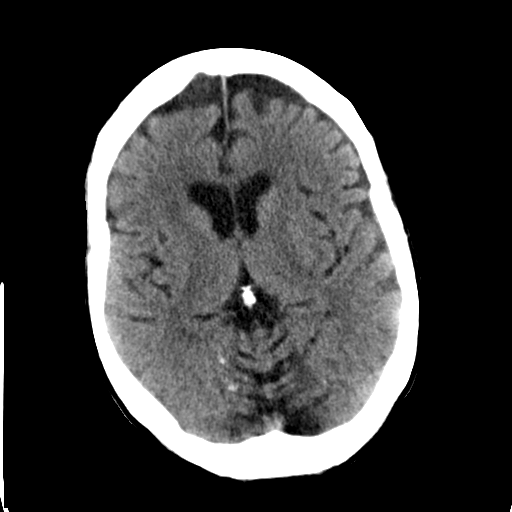
[im 15/30  brain]
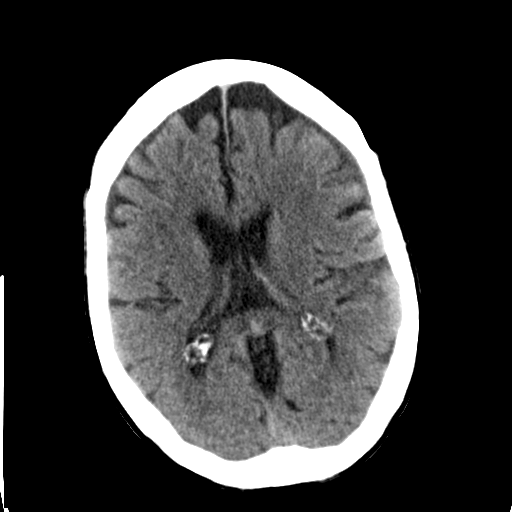
[im 16/30  brain]
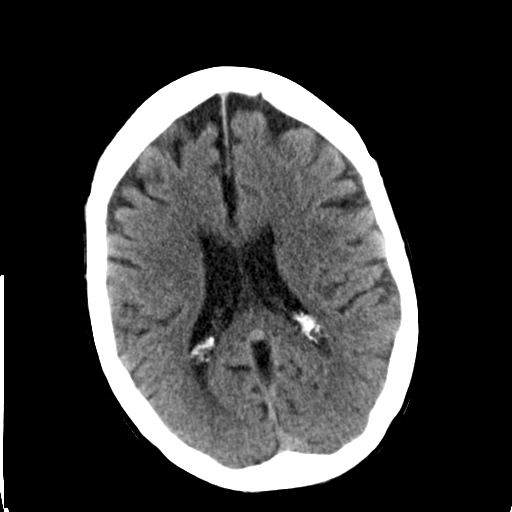
[im 16/30  bone]
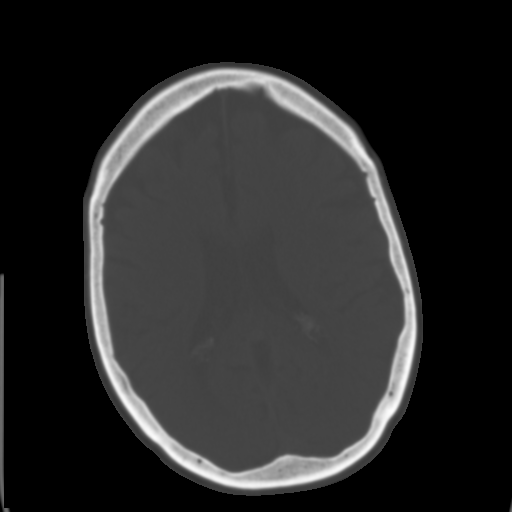
[im 18/30  brain]
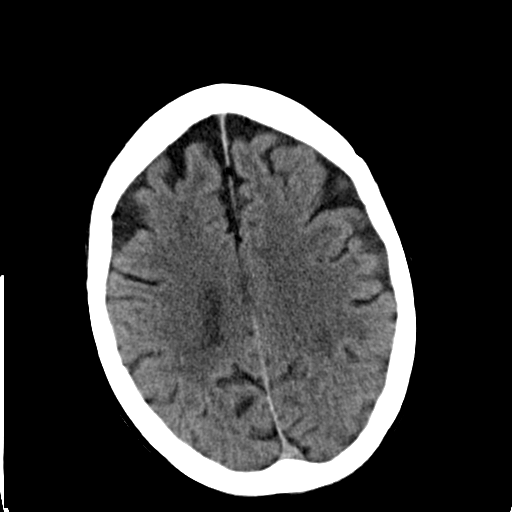
[im 20/30  brain]
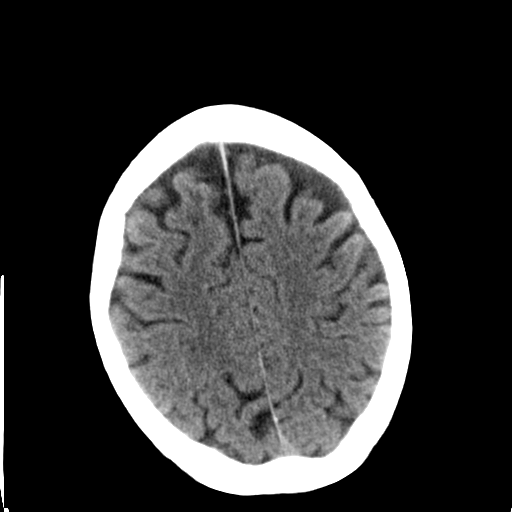
[im 22/30  brain]
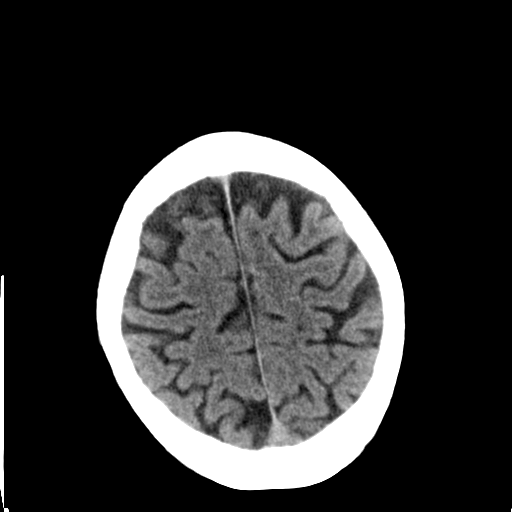
[im 23/30  brain]
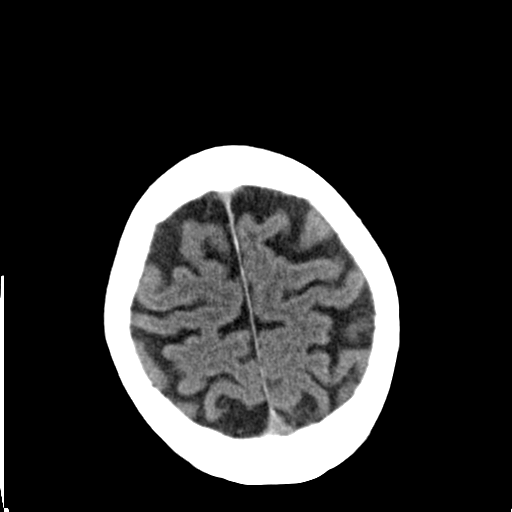
[im 23/30  bone]
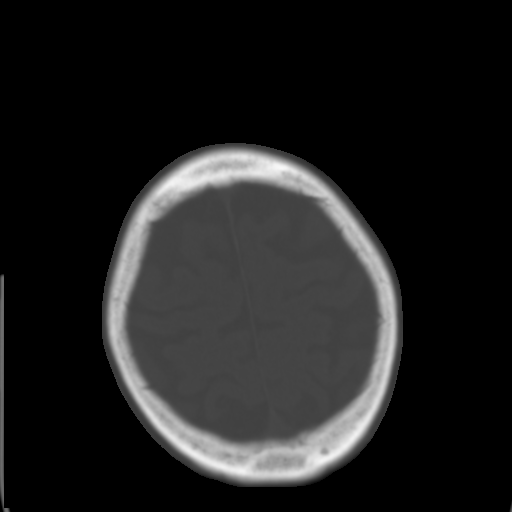
[im 25/30  brain]
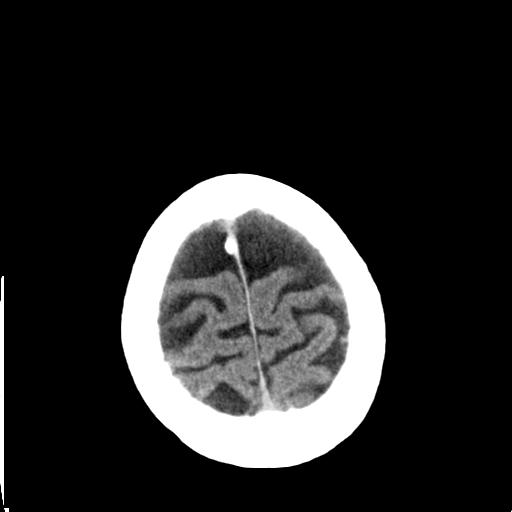
[im 27/30  brain]
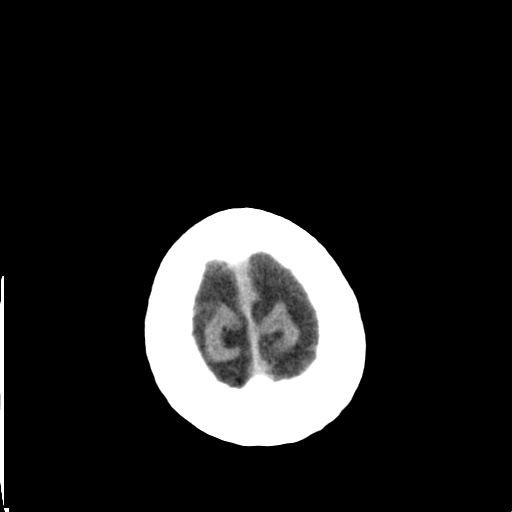
[im 29/30  brain]
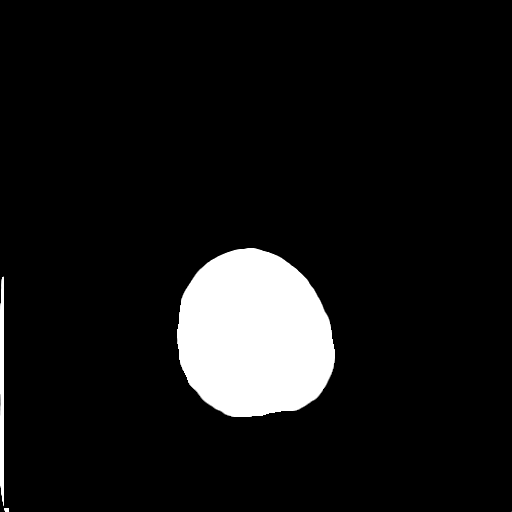

[16 of 30 positions shown; findings below may reference images not displayed]

FINDINGS: Bony calvarium appears intact. Diffuse cortical atrophy is noted.
Minimal chronic ischemic white matter disease is noted. No mass
effect or midline shift is noted. Ventricular size is within normal
limits. There is no evidence of mass lesion, hemorrhage or acute
infarction.
IMPRESSION: Diffuse cortical atrophy. Minimal chronic ischemic white matter
disease. No acute intracranial abnormality seen.

## 2016-03-09 ENCOUNTER — Other Ambulatory Visit: Payer: Self-pay | Admitting: Family Medicine

## 2016-03-09 DIAGNOSIS — G4700 Insomnia, unspecified: Secondary | ICD-10-CM

## 2016-06-29 ENCOUNTER — Other Ambulatory Visit: Payer: Self-pay | Admitting: Family Medicine

## 2016-06-29 DIAGNOSIS — I10 Essential (primary) hypertension: Secondary | ICD-10-CM

## 2016-07-10 ENCOUNTER — Telehealth: Payer: Self-pay | Admitting: Family Medicine

## 2016-07-10 NOTE — Telephone Encounter (Signed)
Spoke to pt to schedule AWV will call back with time she needs to coordinate transporation with family member knb

## 2016-07-14 ENCOUNTER — Ambulatory Visit (INDEPENDENT_AMBULATORY_CARE_PROVIDER_SITE_OTHER): Payer: PPO | Admitting: Family Medicine

## 2016-07-14 ENCOUNTER — Ambulatory Visit (INDEPENDENT_AMBULATORY_CARE_PROVIDER_SITE_OTHER): Payer: PPO

## 2016-07-14 ENCOUNTER — Encounter: Payer: Self-pay | Admitting: Family Medicine

## 2016-07-14 VITALS — BP 180/76 | HR 68 | Temp 97.8°F | Ht 65.0 in | Wt 121.4 lb

## 2016-07-14 VITALS — BP 180/76 | HR 68 | Temp 97.8°F | Wt 121.4 lb

## 2016-07-14 DIAGNOSIS — R350 Frequency of micturition: Secondary | ICD-10-CM

## 2016-07-14 DIAGNOSIS — Z0000 Encounter for general adult medical examination without abnormal findings: Secondary | ICD-10-CM | POA: Diagnosis not present

## 2016-07-14 DIAGNOSIS — M19041 Primary osteoarthritis, right hand: Secondary | ICD-10-CM

## 2016-07-14 DIAGNOSIS — M19042 Primary osteoarthritis, left hand: Secondary | ICD-10-CM

## 2016-07-14 DIAGNOSIS — I10 Essential (primary) hypertension: Secondary | ICD-10-CM | POA: Diagnosis not present

## 2016-07-14 LAB — POCT URINALYSIS DIPSTICK
Bilirubin, UA: NEGATIVE
Blood, UA: NEGATIVE
Glucose, UA: NEGATIVE
Ketones, UA: NEGATIVE
Leukocytes, UA: NEGATIVE
Nitrite, UA: NEGATIVE
Protein, UA: NEGATIVE
Spec Grav, UA: 1.015 (ref 1.030–1.035)
Urobilinogen, UA: 0.2 (ref ?–2.0)
pH, UA: 7 (ref 5.0–8.0)

## 2016-07-14 NOTE — Progress Notes (Signed)
Subjective:   [name] is a 83 y.o. female who presents for Medicare Annual (Subsequent) preventive examination.  Review of Systems:  N/A  Cardiac Risk Factors include: advanced age (>47men, >88 women);hypertension     Objective:     Vitals: BP (!) 180/76 (BP Location: Right Arm)   Pulse 68   Temp 97.8 F (36.6 C) (Oral)   Ht 5\' 5"  (1.651 m)   Wt 121 lb 6.4 oz (55.1 kg)   BMI 20.20 kg/m   Body mass index is 20.2 kg/m.   Tobacco History  Smoking Status  . Never Smoker  Smokeless Tobacco  . Never Used     Counseling given: Not Answered   Past Medical History:  Diagnosis Date  . Breast cyst    removed  . Diverticulitis   . Gallstones   . Hypertension   . Low sodium levels    Past Surgical History:  Procedure Laterality Date  . ABDOMINAL HYSTERECTOMY  2000  . APPENDECTOMY  1940's  . bladder tack  1990's  . BREAST CYST INCISION AND DRAINAGE Left 1990's  . CHOLECYSTECTOMY  09/11/13   Cholangiogram suggested a retained stone ductal dilatation.  Marland Kitchen ERCP W/ SPHICTEROTOMY  09/12/2013   Recurrent stone identified post cholecystectomy.  Marland Kitchen NASAL SINUS SURGERY     Family History  Problem Relation Age of Onset  . Hypertension Father   . CAD Father   . Cancer Brother     unknown type   History  Sexual Activity  . Sexual activity: Not on file    Outpatient Encounter Prescriptions as of 07/14/2016  Medication Sig  . acetaminophen (TYLENOL) 325 MG tablet Take 2 tablets (650 mg total) by mouth every 6 (six) hours as needed for mild pain (or Fever >/= 101).  . enalapril (VASOTEC) 10 MG tablet TAKE ONE (1) TABLET BY MOUTH EVERY DAY  . metoprolol tartrate (LOPRESSOR) 25 MG tablet Take 0.5 tablets (12.5 mg total) by mouth 2 (two) times daily.  . traZODone (DESYREL) 100 MG tablet TAKE ONE TABLET BY MOUTH EVERY NIGHT AT BEDTIME  . loratadine (ALLERGY RELIEF) 10 MG tablet Take 10 mg by mouth daily as needed for allergies.   No facility-administered encounter  medications on file as of 07/14/2016.     Activities of Daily Living In your present state of health, do you have any difficulty performing the following activities: 07/14/2016 07/24/2015  Hearing? N Y  Vision? N N  Difficulty concentrating or making decisions? N N  Walking or climbing stairs? Y Y  Dressing or bathing? N N  Doing errands, shopping? N N  Preparing Food and eating ? N Y  Using the Toilet? N N  In the past six months, have you accidently leaked urine? N Y  Do you have problems with loss of bowel control? N N  Managing your Medications? N N  Managing your Finances? N N  Housekeeping or managing your Housekeeping? N Y  Some recent data might be hidden    Patient Care Team: Margo Common, PA as PCP - General (Family Medicine)    Assessment:    Exercise Activities and Dietary recommendations Current Exercise Habits: The patient does not participate in regular exercise at present, Exercise limited by: Other - see comments (busy taking care of son)  Goals    . Increase water intake          Recommend increasing water intake to 4 glasses a day.      Fall  Risk Fall Risk  07/14/2016 07/24/2015 03/13/2015  Falls in the past year? Yes Yes No  Number falls in past yr: 1 1 -  Injury with Fall? Yes No -  Risk for fall due to : - History of fall(s) -  Follow up - Falls prevention discussed -   Depression Screen PHQ 2/9 Scores 07/14/2016 07/14/2016 07/24/2015 03/13/2015  PHQ - 2 Score 0 0 0 1  PHQ- 9 Score 1 - - -     Cognitive Function     6CIT Screen 07/14/2016  What Year? 4 points  What month? 0 points  What time? 0 points  Count back from 20 0 points  Months in reverse 0 points  Repeat phrase 6 points  Total Score 10    Immunization History  Administered Date(s) Administered  . Influenza Split 01/13/2008, 01/25/2012  . Influenza-Unspecified 12/12/2013  . Pneumococcal Conjugate-13 03/13/2015  . Pneumococcal Polysaccharide-23 04/09/2006  . Zoster 04/23/2006     Screening Tests Health Maintenance  Topic Date Due  . TETANUS/TDAP  04/13/2018 (Originally 02/03/1944)  . INFLUENZA VACCINE  11/11/2016  . DEXA SCAN  Completed  . PNA vac Low Risk Adult  Completed      Plan:  I have personally reviewed and addressed the Medicare Annual Wellness questionnaire and have noted the following in the patient's chart:  A. Medical and social history B. Use of alcohol, tobacco or illicit drugs  C. Current medications and supplements D. Functional ability and status E.  Nutritional status F.  Physical activity G. Advance directives H. List of other physicians I.  Hospitalizations, surgeries, and ER visits in previous 12 months J.  [place] such as hearing and vision if needed, cognitive and depression L. Referrals and appointments - none  In addition, I have reviewed and discussed with patient certain preventive protocols, quality metrics, and best practice recommendations. A written personalized care plan for preventive services as well as general preventive health recommendations were provided to patient.  See attached scanned questionnaire for additional information.   Signed,  Fabio Neighbors, LPN Nurse Health Advisor   MD Recommendations:   Reviewed the health advisor's note and was available for consultation. Agree with documentation and plan. Discussed with patient today.

## 2016-07-14 NOTE — Progress Notes (Signed)
[name]    DOB: 1925-03-23   83 y.o.   MRN: [id number] Visit Date: 07/14/2016  Today's Provider: Vernie Murders, PA   Chief Complaint  Patient presents with  . Hypertension  . Follow-up   Subjective:    HPI Patient is here to follow up from AWE done today.   Hypertension, follow-up:  BP Readings from Last 3 Encounters:  07/14/16 (!) 180/76  07/14/16 (!) 180/76  01/14/16 120/82    She was last seen for hypertension 6 months ago.  BP at that visit was 120/82. Management changes since that visit include continue medications. She reports good compliance with treatment. She is not having side effects.  She is not exercising. She is adherent to low salt diet.   Outside blood pressures are being checked. She is experiencing none.  Patient denies chest pain, chest pressure/discomfort, irregular heart beat and palpitations.   Cardiovascular risk factors include advanced age (older than 71 for men, 75 for women).  Use of agents associated with hypertension: none.     Weight trend: stable Wt Readings from Last 3 Encounters:  07/14/16 121 lb 6.4 oz (55.1 kg)  07/14/16 121 lb 6.4 oz (55.1 kg)  01/14/16 123 lb (55.8 kg)    Current diet: low salt  ------------------------------------------------------------------------ Patient Active Problem List   Diagnosis Date Noted  . Arthritis of both hands 01/14/2016  . Urinary tract infection 08/21/2015  . Acute UTI 08/21/2015  . Scalp laceration 07/29/2015  . Fall 07/29/2015  . Near syncope 07/21/2015  . Foot pain 12/31/2014  . Anxiety 10/17/2014  . Allergic rhinitis 10/16/2014  . Airway hyperreactivity 10/16/2014  . Biliary calculi, common bile duct 10/16/2014  . Clinical depression 10/16/2014  . 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 Clicking shoulder 93/81/8299  . Essential (primary) hypertension 10/16/2014  . H/O gastrointestinal hemorrhage 10/16/2014  . 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  Below normal amount of sodium in the blood 10/16/2014  . Insomnia 10/16/2014  . Bad memory 10/16/2014  . OP (osteoporosis) 10/16/2014  . Cyst of pancreas 10/16/2014  . Temporary cerebral vascular dysfunction 10/16/2014  . Other constipation 03/07/2014  . Right groin pain 03/07/2014  . Internal hemorrhoid 03/07/2014  . Gallstones, common bile duct 08/29/2013  . Cholelithiasis 08/29/2013  . CYST AND PSEUDOCYST OF PANCREAS 03/12/2008   Past Surgical History:  Procedure Laterality Date  . ABDOMINAL HYSTERECTOMY  2000  . APPENDECTOMY  1940's  . bladder tack  1990's  . BREAST CYST INCISION AND DRAINAGE Left 1990's  . CHOLECYSTECTOMY  09/11/13   Cholangiogram suggested a retained stone ductal dilatation.  Marland Kitchen ERCP W/ SPHICTEROTOMY  09/12/2013   Recurrent stone identified post cholecystectomy.  Marland Kitchen NASAL SINUS SURGERY     Family History  Problem Relation Age of Onset  . Hypertension Father   . CAD Father   . Cancer Brother     unknown type   Allergies  Allergen Reactions  . Accolate  [Zafirlukast]   . Metronidazole   . Nitrofurantoin Monohyd Macro Other (See Comments)    GI upset  . Penicillins Diarrhea  . Keflex [Cephalexin] Rash     Previous Medications   ACETAMINOPHEN (TYLENOL) 325 MG TABLET    Take 2 tablets (650 mg total) by mouth every 6 (six) hours as needed for mild pain (or Fever >/= 101).   ENALAPRIL (VASOTEC) 10 MG TABLET    TAKE ONE (1) TABLET BY MOUTH EVERY DAY   LORATADINE (ALLERGY RELIEF) 10 MG TABLET  Take 10 mg by mouth daily as needed for allergies.   METOPROLOL TARTRATE (LOPRESSOR) 25 MG TABLET    Take 0.5 tablets (12.5 mg total) by mouth 2 (two) times daily.   TRAZODONE (DESYREL) 100 MG TABLET    TAKE ONE TABLET BY MOUTH EVERY NIGHT AT BEDTIME    Review of Systems  Constitutional: Positive for fatigue.  HENT: Positive for sneezing.   Respiratory: Negative.   Cardiovascular: Negative.   Genitourinary: Positive for frequency and urgency.       No urinary  symptoms today.  Musculoskeletal: Positive for arthralgias.       Ache in hands and feet. Controlled by a couple Tylenol each day.  Neurological: Negative.     Social History  Substance Use Topics  . Smoking status: Never Smoker  . Smokeless tobacco: Never Used  . Alcohol use No   Objective:   BP (!) 180/76   Pulse 68   Temp 97.8 F (36.6 C) (Oral)   Wt 121 lb 6.4 oz (55.1 kg)   BMI 20.20 kg/m   Physical Exam  Constitutional: She is oriented to person, place, and time. She appears well-developed and well-nourished.  HENT:  Head: Normocephalic.  Right Ear: External ear normal.  Left Ear: External ear normal.  Nose: Nose normal.  Mouth/Throat: Oropharynx is clear and moist.  Eyes: Conjunctivae and EOM are normal. Pupils are equal, round, and reactive to light.  Neck: Normal range of motion. Neck supple. No thyromegaly present.  Cardiovascular: Normal rate, regular rhythm and normal heart sounds.   Pulmonary/Chest: Effort normal and breath sounds normal.  Abdominal: Soft. Bowel sounds are normal.  Musculoskeletal: She exhibits deformity.  Deformities in right hand finger joints and both thumbs. Lateral angulation of right great toe.  Lymphadenopathy:    She has no cervical adenopathy.  Neurological: She is alert and oriented to person, place, and time. She has normal reflexes.  Skin: No rash noted.  Psychiatric: She has a normal mood and affect. Her behavior is normal. Thought content normal.      Assessment & Plan:     1. Frequency of urination No dysuria, fever or hematuria today. Occasional frequency. No incontinence. Urinalysis normal today. Recheck pending renal function tests. - POCT Urinalysis Dipstick  2. Essential (primary) hypertension BP elevated today. States she gets a little nervous in the office. Still taking the Metoprolol 12.5 mg  BID and Enalapril 10 mg qd without side effects. No hypotensive episodes or syncope. Recheck routine labs and follow up  pending reports. - CBC with Differential/Platelet - Comprehensive metabolic panel  3. Arthritis of both hands Unchanged deformities of the right hand fingers and both thumbs. Very few limits in ADL's except to open jars. Uses Tylenol prn aches or pains. - CBC with Differential/Platelet - Comprehensive metabolic panel

## 2016-07-14 NOTE — Patient Instructions (Signed)
[name] , Thank you for taking time to come for your Medicare Wellness Visit. I appreciate your ongoing commitment to your health goals. Please review the following plan we discussed and let me know if I can assist you in the future.   Screening recommendations/referrals: Colonoscopy: last 12/20/07, completed Mammogram: last 02/18/12, completed Bone Density: done 02/18/12 Recommended yearly ophthalmology/optometry visit for glaucoma screening and checkup Recommended yearly dental visit for hygiene and checkup  Vaccinations: Influenza vaccine: done 01/2016 Pneumococcal vaccine: completed series Tdap vaccine: denied  Advanced directives: Requested copy  Conditions/risks identified: Fall risk prevention  Next appointment: None   Preventive Care 56 Years and Older, Female Preventive care refers to lifestyle choices and visits with your health care provider that can promote health and wellness. What does preventive care include?  A yearly physical exam. This is also called an annual well check.  Dental exams once or twice a year.  Routine eye exams. Ask your health care provider how often you should have your eyes checked.  Personal lifestyle choices, including:  Daily care of your teeth and gums.  Regular physical activity.  Eating a healthy diet.  Avoiding tobacco and drug use.  Limiting alcohol use.  Practicing safe sex.  Taking low-dose aspirin every day.  Taking vitamin and mineral supplements as recommended by your health care provider. What happens during an annual well check? The services and screenings done by your health care provider during your annual well check will depend on your age, overall health, lifestyle risk factors, and family history of disease. Counseling  Your health care provider may ask you questions about your:  Alcohol use.  Tobacco use.  Drug use.  Emotional well-being.  Home and relationship well-being.  Sexual  activity.  Eating habits.  History of falls.  Memory and ability to understand (cognition).  Work and work Statistician.  Reproductive health. Screening  You may have the following tests or measurements:  Height, weight, and BMI.  Blood pressure.  Lipid and cholesterol levels. These may be checked every 5 years, or more frequently if you are over 53 years old.  Skin check.  Lung cancer screening. You may have this screening every year starting at age 92 if you have a 30-pack-year history of smoking and currently smoke or have quit within the past 15 years.  Fecal occult blood test (FOBT) of the stool. You may have this test every year starting at age 63.  Flexible sigmoidoscopy or colonoscopy. You may have a sigmoidoscopy every 5 years or a colonoscopy every 10 years starting at age 51.  Hepatitis C blood test.  Hepatitis B blood test.  Sexually transmitted disease (STD) testing.  Diabetes screening. This is done by checking your blood sugar (glucose) after you have not eaten for a while (fasting). You may have this done every 1-3 years.  Bone density scan. This is done to screen for osteoporosis. You may have this done starting at age 53.  Mammogram. This may be done every 1-2 years. Talk to your health care provider about how often you should have regular mammograms. Talk with your health care provider about your test results, treatment options, and if necessary, the need for more tests. Vaccines  Your health care provider may recommend certain vaccines, such as:  Influenza vaccine. This is recommended every year.  Tetanus, diphtheria, and acellular pertussis (Tdap, Td) vaccine. You may need a Td booster every 10 years.  Zoster vaccine. You may need this after age 71.  Pneumococcal  13-valent conjugate (PCV13) vaccine. One dose is recommended after age 67.  Pneumococcal polysaccharide (PPSV23) vaccine. One dose is recommended after age 20. Talk to your health care  provider about which screenings and vaccines you need and how often you need them. This information is not intended to replace advice given to you by your health care provider. Make sure you discuss any questions you have with your health care provider. Document Released: 04/26/2015 Document Revised: 12/18/2015 Document Reviewed: 01/29/2015 Elsevier Interactive Patient Education  2017 [employer] Prevention in the Home Falls can cause injuries. They can happen to people of all ages. There are many things you can do to make your home safe and to help prevent falls. What can I do on the outside of my home?  Regularly fix the edges of walkways and driveways and fix any cracks.  Remove anything that might make you trip as you walk through a door, such as a raised step or threshold.  Trim any bushes or trees on the path to your home.  Use bright outdoor lighting.  Clear any walking paths of anything that might make someone trip, such as rocks or tools.  Regularly check to see if handrails are loose or broken. Make sure that both sides of any steps have handrails.  Any raised decks and porches should have guardrails on the edges.  Have any leaves, snow, or ice cleared regularly.  Use sand or salt on walking paths during winter.  Clean up any spills in your garage right away. This includes oil or grease spills. What can I do in the bathroom?  Use night lights.  Install grab bars by the toilet and in the tub and shower. Do not use towel bars as grab bars.  Use non-skid mats or decals in the tub or shower.  If you need to sit down in the shower, use a plastic, non-slip stool.  Keep the floor dry. Clean up any water that spills on the floor as soon as it happens.  Remove soap buildup in the tub or shower regularly.  Attach bath mats securely with double-sided non-slip rug tape.  Do not have throw rugs and other things on the floor that can make you trip. What can I do in  the bedroom?  Use night lights.  Make sure that you have a light by your bed that is easy to reach.  Do not use any sheets or blankets that are too big for your bed. They should not hang down onto the floor.  Have a firm chair that has side arms. You can use this for support while you get dressed.  Do not have throw rugs and other things on the floor that can make you trip. What can I do in the kitchen?  Clean up any spills right away.  Avoid walking on wet floors.  Keep items that you use a lot in easy-to-reach places.  If you need to reach something above you, use a strong step stool that has a grab bar.  Keep electrical cords out of the way.  Do not use floor polish or wax that makes floors slippery. If you must use wax, use non-skid floor wax.  Do not have throw rugs and other things on the floor that can make you trip. What can I do with my stairs?  Do not leave any items on the stairs.  Make sure that there are handrails on both sides of the stairs and use them. Fix  handrails that are broken or loose. Make sure that handrails are as long as the stairways.  Check any carpeting to make sure that it is firmly attached to the stairs. Fix any carpet that is loose or worn.  Avoid having throw rugs at the top or bottom of the stairs. If you do have throw rugs, attach them to the floor with carpet tape.  Make sure that you have a light switch at the top of the stairs and the bottom of the stairs. If you do not have them, ask someone to add them for you. What else can I do to help prevent falls?  Wear shoes that:  Do not have high heels.  Have rubber bottoms.  Are comfortable and fit you well.  Are closed at the toe. Do not wear sandals.  If you use a stepladder:  Make sure that it is fully opened. Do not climb a closed stepladder.  Make sure that both sides of the stepladder are locked into place.  Ask someone to hold it for you, if possible.  Clearly mark and  make sure that you can see:  Any grab bars or handrails.  First and last steps.  Where the edge of each step is.  Use tools that help you move around (mobility aids) if they are needed. These include:  Canes.  Walkers.  Scooters.  Crutches.  Turn on the lights when you go into a dark area. Replace any light bulbs as soon as they burn out.  Set up your furniture so you have a clear path. Avoid moving your furniture around.  If any of your floors are uneven, fix them.  If there are any pets around you, be aware of where they are.  Review your medicines with your doctor. Some medicines can make you feel dizzy. This can increase your chance of falling. Ask your doctor what other things that you can do to help prevent falls. This information is not intended to replace advice given to you by your health care provider. Make sure you discuss any questions you have with your health care provider. Document Released: 01/24/2009 Document Revised: 09/05/2015 Document Reviewed: 05/04/2014 Elsevier Interactive Patient Education  2017 [employer].

## 2016-07-15 LAB — CBC WITH DIFFERENTIAL/PLATELET
Basophils Absolute: 0 10*3/uL (ref 0.0–0.2)
Basos: 0 %
EOS (ABSOLUTE): 0.1 10*3/uL (ref 0.0–0.4)
Eos: 1 %
Hematocrit: 37.2 % (ref 34.0–46.6)
Hemoglobin: 12.8 g/dL (ref 11.1–15.9)
Immature Grans (Abs): 0 10*3/uL (ref 0.0–0.1)
Immature Granulocytes: 0 %
Lymphocytes Absolute: 1.9 10*3/uL (ref 0.7–3.1)
Lymphs: 29 %
MCH: 32.1 pg (ref 26.6–33.0)
MCHC: 34.4 g/dL (ref 31.5–35.7)
MCV: 93 fL (ref 79–97)
Monocytes Absolute: 0.6 10*3/uL (ref 0.1–0.9)
Monocytes: 9 %
Neutrophils Absolute: 3.9 10*3/uL (ref 1.4–7.0)
Neutrophils: 61 %
Platelets: 190 10*3/uL (ref 150–379)
RBC: 3.99 x10E6/uL (ref 3.77–5.28)
RDW: 13.1 % (ref 12.3–15.4)
WBC: 6.4 10*3/uL (ref 3.4–10.8)

## 2016-07-15 LAB — COMPREHENSIVE METABOLIC PANEL
ALT: 15 IU/L (ref 0–32)
AST: 28 IU/L (ref 0–40)
Albumin/Globulin Ratio: 2.3 — ABNORMAL HIGH (ref 1.2–2.2)
Albumin: 5 g/dL — ABNORMAL HIGH (ref 3.5–4.7)
Alkaline Phosphatase: 100 IU/L (ref 39–117)
BUN/Creatinine Ratio: 21 (ref 12–28)
BUN: 17 mg/dL (ref 8–27)
Bilirubin Total: 0.3 mg/dL (ref 0.0–1.2)
CO2: 26 mmol/L (ref 18–29)
Calcium: 10.1 mg/dL (ref 8.7–10.3)
Chloride: 98 mmol/L (ref 96–106)
Creatinine, Ser: 0.8 mg/dL (ref 0.57–1.00)
GFR calc Af Amer: 76 mL/min/{1.73_m2} (ref >59)
GFR calc non Af Amer: 66 mL/min/{1.73_m2} (ref >59)
Globulin, Total: 2.2 g/dL (ref 1.5–4.5)
Glucose: 101 mg/dL — ABNORMAL HIGH (ref 65–99)
Potassium: 4.5 mmol/L (ref 3.5–5.2)
Sodium: 138 mmol/L (ref 134–144)
Total Protein: 7.2 g/dL (ref 6.0–8.5)

## 2016-07-16 ENCOUNTER — Telehealth: Payer: Self-pay

## 2016-07-16 NOTE — Telephone Encounter (Signed)
Patient advised as below and verbally voiced understanding.

## 2016-07-16 NOTE — Telephone Encounter (Signed)
Left message to call back  

## 2016-07-16 NOTE — Telephone Encounter (Signed)
-----   Message from Margo Common, [place] sent at 07/16/2016  8:26 AM EDT ----- No sign of infection and normal kidney function on blood tests. If urinary frequency increasing, should start with Kegel exercises to try to improve bladder and pelvic floor muscle strength.

## 2016-08-27 ENCOUNTER — Other Ambulatory Visit: Payer: Self-pay | Admitting: Family Medicine

## 2016-08-27 DIAGNOSIS — I10 Essential (primary) hypertension: Secondary | ICD-10-CM

## 2016-08-27 DIAGNOSIS — G4700 Insomnia, unspecified: Secondary | ICD-10-CM

## 2016-08-27 MED ORDER — METOPROLOL TARTRATE 25 MG PO TABS: 12.5000 mg | ORAL_TABLET | Freq: Two times a day (BID) | ORAL | 1 refills | Status: DC

## 2016-08-27 MED ORDER — TRAZODONE HCL 100 MG PO TABS: 100.0000 mg | ORAL_TABLET | Freq: Every day | ORAL | 1 refills | Status: DC

## 2016-08-27 NOTE — Telephone Encounter (Signed)
LOV 07/14/2016. 

## 2016-08-27 NOTE — Telephone Encounter (Signed)
LOV 07/14/2016. Renaldo Fiddler, CMA

## 2016-08-27 NOTE — Telephone Encounter (Signed)
[facility] faxed a request on the following medication. Thanks [facility]  metoprolol tartrate (LOPRESSOR) 25 MG tablet  Take 1/2 tablet by mouth twice a day.

## 2016-08-31 ENCOUNTER — Ambulatory Visit (INDEPENDENT_AMBULATORY_CARE_PROVIDER_SITE_OTHER): Payer: PPO | Admitting: Family Medicine

## 2016-08-31 ENCOUNTER — Encounter: Payer: Self-pay | Admitting: Family Medicine

## 2016-08-31 VITALS — BP 110/56 | HR 65 | Temp 97.5°F | Resp 16 | Wt 120.0 lb

## 2016-08-31 DIAGNOSIS — R600 Localized edema: Secondary | ICD-10-CM | POA: Diagnosis not present

## 2016-08-31 MED ORDER — HYDROCHLOROTHIAZIDE 25 MG PO TABS: ORAL_TABLET | ORAL | 0 refills | Status: DC

## 2016-08-31 NOTE — Patient Instructions (Signed)
Do f/u with Simona Huh next week. Let us know if you have new symptoms.

## 2016-08-31 NOTE — Progress Notes (Signed)
Subjective:     Patient ID: [name], female   DOB: 1924-12-13, 83 y.o.   MRN: [id number]  HPI  Chief Complaint  Patient presents with  . Foot Swelling    Patient comes into office today with concerns of feet and ankles swelling for the past week. Patient reports that she has been wearing support hoses but sweeling does not go down.   Denies associated shortness of breath with recent labs with normal GFR. States that swelling does go down partially at night. Denies heat exposure or excess salty food. Has been occasionally eating canned beans.   Review of Systems     Objective:   Physical Exam  Constitutional: She appears well-developed and well-nourished. She appears distressed.  Cardiovascular: Normal rate and regular rhythm.   Pulmonary/Chest: Breath sounds normal.  Musculoskeletal: She exhibits edema (2 + of ankles).       Assessment:    1. Pedal edema - hydrochlorothiazide (HYDRODIURIL) 25 MG tablet; 1/2 to one pill daily as needed for ankle swellng  Dispense: 14 tablet; Refill: 0    Plan:    F/u with primary provider, Mr. Natale Milch in one week.

## 2016-09-08 ENCOUNTER — Ambulatory Visit (INDEPENDENT_AMBULATORY_CARE_PROVIDER_SITE_OTHER): Payer: PPO | Admitting: Family Medicine

## 2016-09-08 ENCOUNTER — Encounter: Payer: Self-pay | Admitting: Family Medicine

## 2016-09-08 VITALS — BP 110/68 | HR 57 | Temp 97.6°F | Wt 119.6 lb

## 2016-09-08 DIAGNOSIS — R600 Localized edema: Secondary | ICD-10-CM | POA: Diagnosis not present

## 2016-09-08 MED ORDER — HYDROCHLOROTHIAZIDE 25 MG PO TABS: ORAL_TABLET | ORAL | 3 refills | Status: DC

## 2016-09-08 NOTE — Progress Notes (Signed)
[name]    DOB: 09/22/1924   83 y.o.   MRN: [id number] Visit Date: 09/08/2016  Today's Provider: Vernie Murders, PA   Chief Complaint  Patient presents with  . Edema  . Follow-up   Subjective:    HPI Pedal Edema:  Patient is here for a 1 week follow up. Last OV was on 08/31/2016. Patient started HCTZ 12.5 mg daily as needed. Patient reports good compliance with treatment plan. She is taking medication each morning and elevating legs at night. Symptoms are improved.  Patient Active Problem List   Diagnosis Date Noted  . Arthritis of both hands 01/14/2016  . Urinary tract infection 08/21/2015  . Acute UTI 08/21/2015  . Scalp laceration 07/29/2015  . Fall 07/29/2015  . Near syncope 07/21/2015  . Foot pain 12/31/2014  . Anxiety 10/17/2014  . Allergic rhinitis 10/16/2014  . Airway hyperreactivity 10/16/2014  . Biliary calculi, common bile duct 10/16/2014  . Clinical depression 10/16/2014  . 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 Clicking shoulder 62/95/2841  . Essential (primary) hypertension 10/16/2014  . H/O gastrointestinal hemorrhage 10/16/2014  . 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 Below normal amount of sodium in the blood 10/16/2014  . Insomnia 10/16/2014  . Bad memory 10/16/2014  . OP (osteoporosis) 10/16/2014  . Cyst of pancreas 10/16/2014  . Temporary cerebral vascular dysfunction 10/16/2014  . Other constipation 03/07/2014  . Right groin pain 03/07/2014  . Internal hemorrhoid 03/07/2014  . Gallstones, common bile duct 08/29/2013  . Cholelithiasis 08/29/2013  . CYST AND PSEUDOCYST OF PANCREAS 03/12/2008   Past Surgical History:  Procedure Laterality Date  . ABDOMINAL HYSTERECTOMY  2000  . APPENDECTOMY  1940's  . bladder tack  1990's  . BREAST CYST INCISION AND DRAINAGE Left 1990's  . CHOLECYSTECTOMY  09/11/13   Cholangiogram suggested a retained stone ductal dilatation.  Marland Kitchen ERCP W/ SPHICTEROTOMY  09/12/2013   Recurrent stone  identified post cholecystectomy.  Marland Kitchen NASAL SINUS SURGERY     Family History  Problem Relation Age of Onset  . Hypertension Father   . CAD Father   . Cancer Brother        unknown type   Allergies  Allergen Reactions  . Accolate  [Zafirlukast]   . Metronidazole   . Nitrofurantoin Monohyd Macro Other (See Comments)    GI upset  . Penicillins Diarrhea  . Keflex [Cephalexin] Rash     Previous Medications   ACETAMINOPHEN (TYLENOL) 325 MG TABLET    Take 2 tablets (650 mg total) by mouth every 6 (six) hours as needed for mild pain (or Fever >/= 101).   ENALAPRIL (VASOTEC) 10 MG TABLET    TAKE ONE (1) TABLET BY MOUTH EVERY DAY   HYDROCHLOROTHIAZIDE (HYDRODIURIL) 25 MG TABLET    1/2 to one pill daily as needed for ankle swellng   METOPROLOL TARTRATE (LOPRESSOR) 25 MG TABLET    Take 0.5 tablets (12.5 mg total) by mouth 2 (two) times daily.   TRAZODONE (DESYREL) 100 MG TABLET    Take 1 tablet (100 mg total) by mouth at bedtime.    Review of Systems  Constitutional: Negative.   Respiratory: Negative.   Cardiovascular: Positive for leg swelling.  Musculoskeletal:       Edema     Social History  Substance Use Topics  . Smoking status: Never Smoker  . Smokeless tobacco: Never Used  . Alcohol use No   Objective:   BP 110/68 (BP Location: Right Arm, Patient Position: Sitting,  Cuff Size: Normal)   Pulse (!) 57   Temp 97.6 F (36.4 C) (Oral)   Wt 119 lb 9.6 oz (54.3 kg)   SpO2 99%   BMI 19.90 kg/m  Wt Readings from Last 3 Encounters:  09/08/16 119 lb 9.6 oz (54.3 kg)  08/31/16 120 lb (54.4 kg)  07/14/16 121 lb 6.4 oz (55.1 kg)   Physical Exam  Constitutional: She is oriented to person, place, and time. She appears well-developed and well-nourished. No distress.  HENT:  Head: Normocephalic and atraumatic.  Right Ear: Hearing normal.  Left Ear: Hearing normal.  Nose: Nose normal.  Eyes: Conjunctivae and lids are normal. Right eye exhibits no discharge. Left eye exhibits no  discharge. No scleral icterus.  Cardiovascular: Normal rate and regular rhythm.   Pulmonary/Chest: Effort normal. No respiratory distress.  Musculoskeletal: She exhibits no edema.  Neurological: She is alert and oriented to person, place, and time.  Skin: Skin is intact. No lesion and no rash noted.  Psychiatric: She has a normal mood and affect. Her speech is normal and behavior is normal. Thought content normal.      Assessment & Plan:   1. Pedal edema No chest pain, dyspnea or return of edema. The HCTZ did not lower BP significantly. May use this prn edema. Restrict sodium intake and drink plenty of fluids. Recheck in 3-4 months. - hydrochlorothiazide (HYDRODIURIL) 25 MG tablet; 1/2 to one pill daily as needed for ankle swellng  Dispense: 15 tablet; Refill: 3

## 2017-01-07 ENCOUNTER — Ambulatory Visit: Payer: Self-pay | Admitting: Physician Assistant

## 2017-01-07 ENCOUNTER — Ambulatory Visit (INDEPENDENT_AMBULATORY_CARE_PROVIDER_SITE_OTHER): Payer: PPO | Admitting: Physician Assistant

## 2017-01-07 ENCOUNTER — Encounter: Payer: Self-pay | Admitting: Physician Assistant

## 2017-01-07 VITALS — BP 136/70 | HR 64 | Temp 97.5°F | Resp 16 | Wt 119.0 lb

## 2017-01-07 DIAGNOSIS — N3090 Cystitis, unspecified without hematuria: Secondary | ICD-10-CM

## 2017-01-07 LAB — POCT URINALYSIS DIPSTICK
Bilirubin, UA: NEGATIVE
Glucose, UA: NEGATIVE
Ketones, UA: NEGATIVE
Protein, UA: NEGATIVE
Spec Grav, UA: 1.015 (ref 1.010–1.025)
Urobilinogen, UA: 0.2 E.U./dL
pH, UA: 7 (ref 5.0–8.0)

## 2017-01-07 MED ORDER — SULFAMETHOXAZOLE-TRIMETHOPRIM 800-160 MG PO TABS: 1.0000 | ORAL_TABLET | Freq: Two times a day (BID) | ORAL | 0 refills | Status: DC

## 2017-01-07 NOTE — Patient Instructions (Signed)

## 2017-01-07 NOTE — Progress Notes (Signed)
[name]  Chief Complaint  Patient presents with  . Urinary Tract Infection    Subjective:    Patient ID: [name], female    DOB: January 16, 1925, 83 y.o.   MRN: [id number]   Urinary Tract Infection: Patient complains of diarrhea and frequency She has had symptoms for 1 day. Patient denies back pain, fever and vaginal discharge. Patient does have a history of recurrent UTI.  Patient does not have a history of pyelonephritis or other renal issues. Patient denies vaginal discharge and denies new sexual partners. The patient denies recent travel outside of the [place].  Review of Systems  Constitutional: Positive for malaise/fatigue. Negative for chills, diaphoresis, fever and weight loss.  Cardiovascular: Positive for leg swelling.  Gastrointestinal: Positive for diarrhea (Diarrhea yesterday). Negative for abdominal pain, blood in stool, constipation, heartburn, melena, nausea and vomiting.  Genitourinary: Positive for frequency and urgency. Negative for dysuria, flank pain and hematuria.  Neurological: Positive for dizziness and weakness.       Objective:   BP 136/70 (BP Location: Left Arm, Patient Position: Sitting, Cuff Size: Normal)   Pulse 64   Temp (!) 97.5 F (36.4 C) (Oral)   Resp 16   Wt 119 lb (54 kg)   BMI 19.80 kg/m   Patient Active Problem List   Diagnosis Date Noted  . Arthritis of both hands 01/14/2016  . Urinary tract infection 08/21/2015  . Acute UTI 08/21/2015  . Scalp laceration 07/29/2015  . Fall 07/29/2015  . Near syncope 07/21/2015  . Foot pain 12/31/2014  . Anxiety 10/17/2014  . Allergic rhinitis 10/16/2014  . Airway hyperreactivity 10/16/2014  . Biliary calculi, common bile duct 10/16/2014  . Clinical depression 10/16/2014  . 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 Clicking shoulder 46/65/9935  . Essential (primary) hypertension 10/16/2014  . H/O gastrointestinal hemorrhage 10/16/2014  .  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 Below normal amount of sodium in the blood 10/16/2014  . Insomnia 10/16/2014  . Bad memory 10/16/2014  . OP (osteoporosis) 10/16/2014  . Cyst of pancreas 10/16/2014  . Temporary cerebral vascular dysfunction 10/16/2014  . Other constipation 03/07/2014  . Right groin pain 03/07/2014  . Internal hemorrhoid 03/07/2014  . Gallstones, common bile duct 08/29/2013  . Cholelithiasis 08/29/2013  . CYST AND PSEUDOCYST OF PANCREAS 03/12/2008    Outpatient Encounter Prescriptions as of 01/07/2017  Medication Sig  . acetaminophen (TYLENOL) 325 MG tablet Take 2 tablets (650 mg total) by mouth every 6 (six) hours as needed for mild pain (or Fever >/= 101).  . enalapril (VASOTEC) 10 MG tablet TAKE ONE (1) TABLET BY MOUTH EVERY DAY  . hydrochlorothiazide (HYDRODIURIL) 25 MG tablet 1/2 to one pill daily as needed for ankle swellng  . metoprolol tartrate (LOPRESSOR) 25 MG tablet Take 0.5 tablets (12.5 mg total) by mouth 2 (two) times daily.  . traZODone (DESYREL) 100 MG tablet Take 1 tablet (100 mg total) by mouth at bedtime.   No facility-administered encounter medications on file as of 01/07/2017.     Allergies  Allergen Reactions  . Accolate  [Zafirlukast]   . Metronidazole   . Nitrofurantoin Monohyd Macro Other (See Comments)    GI upset  . Penicillins Diarrhea  . Keflex [Cephalexin] Rash       Physical Exam  Constitutional: She is oriented to person, place, and time. She appears well-developed and well-nourished. No distress.  Cardiovascular: Normal rate and regular rhythm.   Pulmonary/Chest: Effort normal and breath sounds normal.  Abdominal: Soft. Bowel sounds are normal. She exhibits no distension. There is tenderness in the suprapubic area. There is no rebound, no guarding and no CVA tenderness.  Neurological: She is alert and oriented to person, place, and time.  Skin: Skin is warm and dry. She is not diaphoretic.  Psychiatric: She has a normal  mood and affect. Her behavior is normal.       Assessment & Plan:  1. Cystitis  - POCT urinalysis dipstick - Urine Culture - sulfamethoxazole-trimethoprim (BACTRIM DS) 800-160 MG tablet; Take 1 tablet by mouth 2 (two) times daily.  Dispense: 14 tablet; Refill: 0  Return if symptoms worsen or fail to improve.  The entirety of the information documented in the History of Present Illness, Review of Systems and Physical Exam were personally obtained by me. Portions of this information were initially documented by Ashley Royalty, CMA and reviewed by me for thoroughness and accuracy.

## 2017-01-11 ENCOUNTER — Other Ambulatory Visit: Payer: Self-pay

## 2017-01-11 ENCOUNTER — Telehealth: Payer: Self-pay

## 2017-01-11 ENCOUNTER — Inpatient Hospital Stay
Admission: EM | Admit: 2017-01-11 | Discharge: 2017-01-14 | DRG: 640 | Disposition: A | Discharge status: Home / Self Care | Payer: PPO | Attending: Internal Medicine | Admitting: Internal Medicine

## 2017-01-11 DIAGNOSIS — N3000 Acute cystitis without hematuria: Secondary | ICD-10-CM | POA: Diagnosis not present

## 2017-01-11 DIAGNOSIS — Z90710 Acquired absence of both cervix and uterus: Secondary | ICD-10-CM | POA: Diagnosis not present

## 2017-01-11 DIAGNOSIS — B961 Klebsiella pneumoniae [K. pneumoniae] as the cause of diseases classified elsewhere: Secondary | ICD-10-CM | POA: Diagnosis present

## 2017-01-11 DIAGNOSIS — Z8249 Family history of ischemic heart disease and other diseases of the circulatory system: Secondary | ICD-10-CM | POA: Diagnosis not present

## 2017-01-11 DIAGNOSIS — E860 Dehydration: Secondary | ICD-10-CM | POA: Diagnosis present

## 2017-01-11 DIAGNOSIS — Z66 Do not resuscitate: Secondary | ICD-10-CM | POA: Diagnosis not present

## 2017-01-11 DIAGNOSIS — E876 Hypokalemia: Secondary | ICD-10-CM | POA: Diagnosis not present

## 2017-01-11 DIAGNOSIS — G44099 Other trigeminal autonomic cephalgias (TAC), not intractable: Secondary | ICD-10-CM | POA: Diagnosis not present

## 2017-01-11 DIAGNOSIS — I10 Essential (primary) hypertension: Secondary | ICD-10-CM | POA: Diagnosis not present

## 2017-01-11 DIAGNOSIS — J309 Allergic rhinitis, unspecified: Secondary | ICD-10-CM | POA: Diagnosis not present

## 2017-01-11 DIAGNOSIS — Z681 Body mass index (BMI) 19 or less, adult: Secondary | ICD-10-CM

## 2017-01-11 DIAGNOSIS — Z881 Allergy status to other antibiotic agents status: Secondary | ICD-10-CM | POA: Diagnosis not present

## 2017-01-11 DIAGNOSIS — N390 Urinary tract infection, site not specified: Secondary | ICD-10-CM | POA: Diagnosis not present

## 2017-01-11 DIAGNOSIS — R112 Nausea with vomiting, unspecified: Secondary | ICD-10-CM | POA: Diagnosis present

## 2017-01-11 DIAGNOSIS — E871 Hypo-osmolality and hyponatremia: Principal | ICD-10-CM | POA: Diagnosis present

## 2017-01-11 DIAGNOSIS — Z9049 Acquired absence of other specified parts of digestive tract: Secondary | ICD-10-CM

## 2017-01-11 DIAGNOSIS — T370X5A Adverse effect of sulfonamides, initial encounter: Secondary | ICD-10-CM | POA: Diagnosis not present

## 2017-01-11 DIAGNOSIS — Z79899 Other long term (current) drug therapy: Secondary | ICD-10-CM | POA: Diagnosis not present

## 2017-01-11 DIAGNOSIS — Z23 Encounter for immunization: Secondary | ICD-10-CM | POA: Diagnosis not present

## 2017-01-11 DIAGNOSIS — B9620 Unspecified Escherichia coli [E. coli] as the cause of diseases classified elsewhere: Secondary | ICD-10-CM | POA: Diagnosis present

## 2017-01-11 DIAGNOSIS — Z888 Allergy status to other drugs, medicaments and biological substances status: Secondary | ICD-10-CM

## 2017-01-11 DIAGNOSIS — E43 Unspecified severe protein-calorie malnutrition: Secondary | ICD-10-CM | POA: Diagnosis present

## 2017-01-11 DIAGNOSIS — F329 Major depressive disorder, single episode, unspecified: Secondary | ICD-10-CM | POA: Diagnosis present

## 2017-01-11 DIAGNOSIS — Z880 Allergy status to penicillin: Secondary | ICD-10-CM | POA: Diagnosis not present

## 2017-01-11 HISTORY — DX: Hypo-osmolality and hyponatremia: E87.1

## 2017-01-11 LAB — URINALYSIS, COMPLETE (UACMP) WITH MICROSCOPIC
Bacteria, UA: NONE SEEN
Bilirubin Urine: NEGATIVE
Glucose, UA: NEGATIVE mg/dL
Hgb urine dipstick: NEGATIVE
Ketones, ur: 20 mg/dL — AB
Leukocytes, UA: NEGATIVE
Nitrite: NEGATIVE
Protein, ur: NEGATIVE mg/dL
Specific Gravity, Urine: 1.009 (ref 1.005–1.030)
Squamous Epithelial / LPF: NONE SEEN
pH: 6 (ref 5.0–8.0)

## 2017-01-11 LAB — COMPREHENSIVE METABOLIC PANEL
ALT: 22 U/L (ref 14–54)
AST: 68 U/L — ABNORMAL HIGH (ref 15–41)
Albumin: 4.1 g/dL (ref 3.5–5.0)
Alkaline Phosphatase: 77 U/L (ref 38–126)
Anion gap: 8 (ref 5–15)
BUN: 15 mg/dL (ref 6–20)
CO2: 24 mmol/L (ref 22–32)
Calcium: 9.3 mg/dL (ref 8.9–10.3)
Chloride: 84 mmol/L — ABNORMAL LOW (ref 101–111)
Creatinine, Ser: 0.88 mg/dL (ref 0.44–1.00)
GFR calc Af Amer: >60 mL/min (ref >60)
GFR calc non Af Amer: 57 mL/min — ABNORMAL LOW (ref >60)
Glucose, Bld: 101 mg/dL — ABNORMAL HIGH (ref 65–99)
Potassium: 4.3 mmol/L (ref 3.5–5.1)
Sodium: 116 mmol/L — CL (ref 135–145)
Total Bilirubin: 1.1 mg/dL (ref 0.3–1.2)
Total Protein: 6.9 g/dL (ref 6.5–8.1)

## 2017-01-11 LAB — CBC
HCT: 34.8 % — ABNORMAL LOW (ref 35.0–47.0)
Hemoglobin: 12.6 g/dL (ref 12.0–16.0)
MCH: 32.4 pg (ref 26.0–34.0)
MCHC: 36.3 g/dL — ABNORMAL HIGH (ref 32.0–36.0)
MCV: 89.3 fL (ref 80.0–100.0)
Platelets: 179 10*3/uL (ref 150–440)
RBC: 3.9 MIL/uL (ref 3.80–5.20)
RDW: 12.6 % (ref 11.5–14.5)
WBC: 7.2 10*3/uL (ref 3.6–11.0)

## 2017-01-11 LAB — URINE CULTURE
MICRO NUMBER:: 81073588
SPECIMEN QUALITY:: ADEQUATE

## 2017-01-11 LAB — OSMOLALITY, URINE: Osmolality, Ur: 339 mOsm/kg (ref 300–900)

## 2017-01-11 LAB — SODIUM
Sodium: 119 mmol/L — CL (ref 135–145)
Sodium: 120 mmol/L — ABNORMAL LOW (ref 135–145)

## 2017-01-11 LAB — OSMOLALITY: Osmolality: 249 mOsm/kg — CL (ref 275–295)

## 2017-01-11 LAB — TROPONIN I: Troponin I: <0.03 ng/mL (ref <0.03)

## 2017-01-11 LAB — TSH: TSH: 1.021 u[IU]/mL (ref 0.350–4.500)

## 2017-01-11 LAB — SODIUM, URINE, RANDOM: Sodium, Ur: 87 mmol/L

## 2017-01-11 MED ORDER — CIPROFLOXACIN IN D5W 400 MG/200ML IV SOLN
400.0000 mg | Freq: Two times a day (BID) | INTRAVENOUS | Status: DC
  Administered 2017-01-11 – 2017-01-14 (×6): 400 mg via INTRAVENOUS
  Filled 2017-01-11 (×7): qty 200

## 2017-01-11 MED ORDER — HYDROCODONE-ACETAMINOPHEN 5-325 MG PO TABS
1.0000 | ORAL_TABLET | ORAL | Status: DC | PRN
  Administered 2017-01-11: 20:00:00 2 via ORAL
  Administered 2017-01-12: 1 via ORAL
  Administered 2017-01-13: 23:00:00 2 via ORAL
  Filled 2017-01-11 (×2): qty 2
  Filled 2017-01-11: qty 1

## 2017-01-11 MED ORDER — ENALAPRIL MALEATE 10 MG PO TABS
10.0000 mg | ORAL_TABLET | Freq: Every day | ORAL | Status: DC
  Administered 2017-01-11 – 2017-01-14 (×4): 10 mg via ORAL
  Filled 2017-01-11 (×4): qty 1

## 2017-01-11 MED ORDER — ONDANSETRON HCL 4 MG PO TABS
4.0000 mg | ORAL_TABLET | Freq: Four times a day (QID) | ORAL | Status: DC | PRN
  Administered 2017-01-11: 4 mg via ORAL
  Filled 2017-01-11 (×2): qty 1

## 2017-01-11 MED ORDER — BISACODYL 5 MG PO TBEC
5.0000 mg | DELAYED_RELEASE_TABLET | Freq: Every day | ORAL | Status: DC | PRN
  Administered 2017-01-12: 21:00:00 5 mg via ORAL
  Filled 2017-01-11: qty 1

## 2017-01-11 MED ORDER — CIPROFLOXACIN IN D5W 400 MG/200ML IV SOLN
400.0000 mg | Freq: Once | INTRAVENOUS | Status: AC
  Administered 2017-01-11: 400 mg via INTRAVENOUS
  Filled 2017-01-11: qty 200

## 2017-01-11 MED ORDER — HYDRALAZINE HCL 20 MG/ML IJ SOLN: 10.0000 mg | Freq: Four times a day (QID) | INTRAMUSCULAR | Status: DC | PRN

## 2017-01-11 MED ORDER — METOPROLOL TARTRATE 25 MG PO TABS
12.5000 mg | ORAL_TABLET | Freq: Two times a day (BID) | ORAL | Status: DC
  Administered 2017-01-11 – 2017-01-14 (×6): 12.5 mg via ORAL
  Filled 2017-01-11 (×6): qty 1

## 2017-01-11 MED ORDER — TRAZODONE HCL 50 MG PO TABS
100.0000 mg | ORAL_TABLET | Freq: Every day | ORAL | Status: DC
  Administered 2017-01-11 – 2017-01-13 (×3): 100 mg via ORAL
  Filled 2017-01-11 (×3): qty 2

## 2017-01-11 MED ORDER — MORPHINE SULFATE (PF) 2 MG/ML IV SOLN
2.0000 mg | INTRAVENOUS | Status: DC | PRN
  Administered 2017-01-11: 2 mg via INTRAVENOUS
  Filled 2017-01-11: qty 1

## 2017-01-11 MED ORDER — ENOXAPARIN SODIUM 40 MG/0.4ML ~~LOC~~ SOLN
40.0000 mg | SUBCUTANEOUS | Status: DC
  Administered 2017-01-12 – 2017-01-13 (×2): 40 mg via SUBCUTANEOUS
  Filled 2017-01-11 (×2): qty 0.4

## 2017-01-11 MED ORDER — SODIUM CHLORIDE 0.9 % IV SOLN
INTRAVENOUS | Status: DC
  Administered 2017-01-11 – 2017-01-14 (×5): via INTRAVENOUS

## 2017-01-11 MED ORDER — ONDANSETRON HCL 4 MG/2ML IJ SOLN: 4.0000 mg | Freq: Four times a day (QID) | INTRAMUSCULAR | Status: DC | PRN

## 2017-01-11 MED ORDER — INFLUENZA VAC SPLIT HIGH-DOSE 0.5 ML IM SUSY
0.5000 mL | PREFILLED_SYRINGE | INTRAMUSCULAR | Status: DC
  Filled 2017-01-11: qty 0.5

## 2017-01-11 MED ORDER — SODIUM CHLORIDE 0.9 % IV BOLUS (SEPSIS)
1000.0000 mL | Freq: Once | INTRAVENOUS | Status: AC
  Administered 2017-01-11: 1000 mL via INTRAVENOUS

## 2017-01-11 MED ORDER — SENNOSIDES-DOCUSATE SODIUM 8.6-50 MG PO TABS
1.0000 | ORAL_TABLET | Freq: Every evening | ORAL | Status: DC | PRN
  Administered 2017-01-13: 23:00:00 1 via ORAL
  Filled 2017-01-11: qty 1

## 2017-01-11 MED ORDER — ACETAMINOPHEN 325 MG PO TABS
650.0000 mg | ORAL_TABLET | Freq: Four times a day (QID) | ORAL | Status: DC | PRN
  Administered 2017-01-13: 650 mg via ORAL
  Filled 2017-01-11: qty 2

## 2017-01-11 NOTE — Progress Notes (Signed)
Pt continues to complain of pain 7/10 to suprapubic area.  Pt was given 2 Norco at 2000 and zofran with no relief.  Call out to [facility] doc for possible medication for bladder spasms. Dorna Bloom RN

## 2017-01-11 NOTE — H&P (Signed)
[place] [facility] [place] NAME: [name]    MR#:  [id number]  DATE OF BIRTH:  [birth year]  DATE OF ADMISSION:  01/11/2017  PRIMARY CARE PHYSICIAN: Chrismon, Vickki Muff, PA   REQUESTING/REFERRING PHYSICIAN: Dr. Kennieth Rad CHIEF COMPLAINT:   Not feeling well HISTORY OF PRESENT ILLNESS:  [name]  is a 83 y.o. female with a known history of Hypertension who presents with above complaint. Patient was diagnosed with urinary tract infection at the end of September. She was sent home on Bactrim. Since that time she has been unable to take the antibiotic and has felt nauseous and vomiting. She presents to the ER due to poor by mouth intake, nausea vomiting and not feeling well. She continues to have dysuria, frequency and urgency. Urine culture taken from September 27 showed Escherichia coli and Klebsiella which are sensitive to ciprofloxacin as well as Bactrim. She was prescribed Bactrim at her discharge. She also was found to have hyponatremia with a sodium level of 116. PAST MEDICAL HISTORY:   Past Medical History:  Diagnosis Date  . Breast cyst    removed  . Diverticulitis   . Gallstones   . Hypertension   . Low sodium levels     PAST SURGICAL HISTORY:   Past Surgical History:  Procedure Laterality Date  . ABDOMINAL HYSTERECTOMY  2000  . APPENDECTOMY  1940's  . bladder tack  1990's  . BREAST CYST INCISION AND DRAINAGE Left 1990's  . CHOLECYSTECTOMY  09/11/13   Cholangiogram suggested a retained stone ductal dilatation.  Marland Kitchen ERCP W/ SPHICTEROTOMY  09/12/2013   Recurrent stone identified post cholecystectomy.  Marland Kitchen NASAL SINUS SURGERY      SOCIAL HISTORY:   Social History  Substance Use Topics  . Smoking status: Never Smoker  . Smokeless tobacco: Never Used  . Alcohol use No    FAMILY HISTORY:   Family History  Problem Relation Age of Onset  . Hypertension Father   . CAD Father   . Cancer Brother        unknown type    DRUG  ALLERGIES:   Allergies  Allergen Reactions  . Accolate  [Zafirlukast]   . Metronidazole   . Nitrofurantoin Monohyd Macro Other (See Comments)    GI upset  . Penicillins Diarrhea  . Keflex [Cephalexin] Rash    REVIEW OF SYSTEMS:   Review of Systems  Constitutional: Positive for malaise/fatigue. Negative for chills and fever.  HENT: Negative.  Negative for ear discharge, ear pain, hearing loss, nosebleeds and sore throat.   Eyes: Negative.  Negative for blurred vision and pain.  Respiratory: Negative.  Negative for cough, hemoptysis, shortness of breath and wheezing.   Cardiovascular: Negative.  Negative for chest pain, palpitations and leg swelling.  Gastrointestinal: Negative.  Negative for abdominal pain, blood in stool, diarrhea, nausea and vomiting.  Genitourinary: Positive for dysuria, frequency and urgency.  Musculoskeletal: Negative.  Negative for back pain.  Skin: Negative.   Neurological: Positive for weakness. Negative for dizziness, tremors, speech change, focal weakness, seizures and headaches.  Endo/Heme/Allergies: Negative.  Does not bruise/bleed easily.  Psychiatric/Behavioral: Negative.  Negative for depression, hallucinations and suicidal ideas.    MEDICATIONS AT HOME:   Prior to Admission medications   Medication Sig Start Date End Date Taking? Authorizing Provider  acetaminophen (TYLENOL) 325 MG tablet Take 2 tablets (650 mg total) by mouth every 6 (six) hours as needed for mild pain (or Fever >/= 101). 07/23/15   Gouru,  Illene Silver, MD  enalapril (VASOTEC) 10 MG tablet TAKE ONE (1) TABLET BY MOUTH EVERY DAY 06/29/16   Chrismon, Vickki Muff, PA  hydrochlorothiazide (HYDRODIURIL) 25 MG tablet 1/2 to one pill daily as needed for ankle swellng 09/08/16   Chrismon, Vickki Muff, PA  metoprolol tartrate (LOPRESSOR) 25 MG tablet Take 0.5 tablets (12.5 mg total) by mouth 2 (two) times daily. 08/27/16   Chrismon, Vickki Muff, PA  sulfamethoxazole-trimethoprim (BACTRIM DS) 800-160 MG tablet  Take 1 tablet by mouth 2 (two) times daily. 01/07/17 01/14/17  Trinna Post, PA-C  traZODone (DESYREL) 100 MG tablet Take 1 tablet (100 mg total) by mouth at bedtime. 08/27/16   Chrismon, Vickki Muff, PA      VITAL SIGNS:  Blood pressure (!) 170/90, pulse 79, temperature (!) 97.3 F (36.3 C), temperature source Oral, resp. rate 18, height 5\' 5"  (1.651 m), weight 53.1 kg (117 lb), SpO2 95 %.  PHYSICAL EXAMINATION:   Physical Exam  Constitutional: She is oriented to person, place, and time and well-developed, well-nourished, and in no distress. No distress.  HENT:  Head: Normocephalic.  Eyes: No scleral icterus.  Neck: Normal range of motion. Neck supple. No JVD present. No tracheal deviation present.  Cardiovascular: Normal rate, regular rhythm and normal heart sounds.  Exam reveals no gallop and no friction rub.   No murmur heard. Pulmonary/Chest: Effort normal and breath sounds normal. No respiratory distress. She has no wheezes. She has no rales. She exhibits no tenderness.  Abdominal: Soft. Bowel sounds are normal. She exhibits no distension and no mass. There is no tenderness. There is no rebound and no guarding.  Musculoskeletal: Normal range of motion. She exhibits deformity. She exhibits no edema.  Severe arthritis in her hands  Neurological: She is alert and oriented to person, place, and time.  Skin: Skin is warm. No rash noted. No erythema.  Psychiatric: Affect and judgment normal.      LABORATORY PANEL:   CBC  Recent Labs Lab 01/11/17 1418  WBC 7.2  HGB 12.6  HCT 34.8*  PLT 179   ------------------------------------------------------------------------------------------------------------------  Chemistries   Recent Labs Lab 01/11/17 1418  NA 116*  K 4.3  CL 84*  CO2 24  GLUCOSE 101*  BUN 15  CREATININE 0.88  CALCIUM 9.3  AST 68*  ALT 22  ALKPHOS 77  BILITOT 1.1    ------------------------------------------------------------------------------------------------------------------  Cardiac Enzymes No results for input(s): TROPONINI in the last 168 hours. ------------------------------------------------------------------------------------------------------------------  RADIOLOGY:  No results found.  EKG:  NSR  IMPRESSION AND PLAN:   83 year old female who presents with generalized weakness and found to have hyponatremia.   1. Hyponatremia: Patient has some chronic hyponatremia. Today's hyponatremia is due to poor by mouth intake with persistent nausea and vomiting due to urinary tract infection Start IV fluids Sodium levels every 6 hours Nephrology consultation requested Stop HCTZ  2. E: Coli and Klebsiella UTI from urine culture on September 27 Start ciprofloxacin  3. Essential hypertension: Continue metoprolol and enalapril Stop HCTZ due to hyponatremia  4. Generalized weakness due to hyponatremia and urinary tract infection Physical therapy consultation requested  All the records are reviewed and case discussed with ED provider. Management plans discussed with the patient and she is in agreement  CODE STATUS: DO NOT RESUSCITATE  TOTAL TIME TAKING CARE OF THIS PATIENT: 42 minutes.    MODY, SITAL M.D on 01/11/2017 at 3:11 PM  Between 7am to 6pm - Pager - [phone]  After 6pm go to [email or web address] - password  [email or web address]  [facility]  Office  [phone]  CC: Primary care physician; Chrismon, Vickki Muff, PA

## 2017-01-11 NOTE — ED Provider Notes (Signed)
[facility] Emergency Department Provider Note  ____________________________________________   First MD Initiated Contact with Patient 01/11/17 1349     (approximate)  I have reviewed the triage vital signs and the nursing notes.   HISTORY  Chief Complaint Urinary Tract Infection and Dehydration    HPI [name] is a 83 y.o. female who self presents to the emergency department with several days of generalized fatigue.4 days ago she was diagnosed with urinary tract infection by her primary care physician who prescribed her Bactrim however after taking it for one or 2 days the patient became progressively more nauseated and she had to stop taking it. Over the past several days she has had increasing lethargy and difficulty ambulating. She denies fevers or chills. She denies back pain. Her symptoms have been insidious in onset and slowly progressive.   Past Medical History:  Diagnosis Date  . Breast cyst    removed  . Diverticulitis   . Gallstones   . Hypertension   . Low sodium levels     Patient Active Problem List   Diagnosis Date Noted  . Hyponatremia 01/11/2017  . Arthritis of both hands 01/14/2016  . Urinary tract infection 08/21/2015  . Acute UTI 08/21/2015  . Scalp laceration 07/29/2015  . Fall 07/29/2015  . Near syncope 07/21/2015  . Foot pain 12/31/2014  . Anxiety 10/17/2014  . Allergic rhinitis 10/16/2014  . Airway hyperreactivity 10/16/2014  . Biliary calculi, common bile duct 10/16/2014  . Clinical depression 10/16/2014  . 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 Clicking shoulder 66/09/3014  . Essential (primary) hypertension 10/16/2014  . H/O gastrointestinal hemorrhage 10/16/2014  . 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 Below normal amount of sodium in the blood 10/16/2014  . Insomnia 10/16/2014  . Bad memory 10/16/2014  . OP (osteoporosis) 10/16/2014  . Cyst of pancreas 10/16/2014  . Temporary cerebral vascular  dysfunction 10/16/2014  . Other constipation 03/07/2014  . Right groin pain 03/07/2014  . Internal hemorrhoid 03/07/2014  . Gallstones, common bile duct 08/29/2013  . Cholelithiasis 08/29/2013  . CYST AND PSEUDOCYST OF PANCREAS 03/12/2008    Past Surgical History:  Procedure Laterality Date  . ABDOMINAL HYSTERECTOMY  2000  . APPENDECTOMY  1940's  . bladder tack  1990's  . BREAST CYST INCISION AND DRAINAGE Left 1990's  . CHOLECYSTECTOMY  09/11/13   Cholangiogram suggested a retained stone ductal dilatation.  Marland Kitchen ERCP W/ SPHICTEROTOMY  09/12/2013   Recurrent stone identified post cholecystectomy.  Marland Kitchen NASAL SINUS SURGERY      Prior to Admission medications   Medication Sig Start Date End Date Taking? Authorizing Provider  acetaminophen (TYLENOL) 325 MG tablet Take 2 tablets (650 mg total) by mouth every 6 (six) hours as needed for mild pain (or Fever >/= 101). 07/23/15   Gouru, Illene Silver, MD  enalapril (VASOTEC) 10 MG tablet TAKE ONE (1) TABLET BY MOUTH EVERY DAY 06/29/16   Chrismon, Vickki Muff, PA  hydrochlorothiazide (HYDRODIURIL) 25 MG tablet 1/2 to one pill daily as needed for ankle swellng 09/08/16   Chrismon, Vickki Muff, PA  metoprolol tartrate (LOPRESSOR) 25 MG tablet Take 0.5 tablets (12.5 mg total) by mouth 2 (two) times daily. 08/27/16   Chrismon, Vickki Muff, PA  sulfamethoxazole-trimethoprim (BACTRIM DS) 800-160 MG tablet Take 1 tablet by mouth 2 (two) times daily. 01/07/17 01/14/17  Trinna Post, PA-C  traZODone (DESYREL) 100 MG tablet Take 1 tablet (100 mg total) by mouth at bedtime. 08/27/16   Chrismon, Vickki Muff, PA  Allergies Accolate  [zafirlukast]; Metronidazole; Nitrofurantoin monohyd macro; Penicillins; and Keflex [cephalexin]  Family History  Problem Relation Age of Onset  . Hypertension Father   . CAD Father   . Cancer Brother        unknown type    Social History Social History  Substance Use Topics  . Smoking status: Never Smoker  . Smokeless tobacco: Never Used    . Alcohol use No    Review of Systems Constitutional: No fever/chills Eyes: No visual changes. ENT: No sore throat. Cardiovascular: Denies chest pain. Respiratory: Denies shortness of breath. Gastrointestinal: No abdominal pain.  No nausea, no vomiting.  No diarrhea.  No constipation. Genitourinary: positive for for dysuria. Musculoskeletal: Negative for back pain. Skin: Negative for rash. Neurological: Negative for headaches, focal weakness or numbness.   ____________________________________________   PHYSICAL EXAM:  VITAL SIGNS: ED Triage Vitals  Enc Vitals Group     BP 01/11/17 1054 (!) 169/67     Pulse Rate 01/11/17 1054 (!) 59     Resp 01/11/17 1054 18     Temp 01/11/17 1054 (!) 97.3 F (36.3 C)     Temp Source 01/11/17 1054 Oral     SpO2 01/11/17 1054 99 %     Weight 01/11/17 1054 117 lb (53.1 kg)     Height 01/11/17 1054 5\' 5"  (1.651 m)     Head Circumference --      Peak Flow --      Pain Score 01/11/17 1323 0     Pain Loc --      Pain Edu? --      Excl. in [place]? --     Constitutional: alert and oriented 4 appears tired no diaphoresis speaks in full clear sentences Eyes: PERRL EOMI. Head: Atraumatic. Nose: No congestion/rhinnorhea. Mouth/Throat: No trismus Neck: No stridor.   Cardiovascular: Normal rate, regular rhythm. Grossly normal heart sounds.  Good peripheral circulation. Respiratory: Normal respiratory effort.  No retractions. Lungs CTAB and moving good air Gastrointestinal: soft nondistended nontender no rebound or guarding no peritonitis no McBurney's tenderness negative Rovsing's no costovertebral tenderness Musculoskeletal: No lower extremity edema   Neurologic:  Normal speech and language. No gross focal neurologic deficits are appreciated. Skin:  Skin is warm, dry and intact. No rash noted. Psychiatric: Mood and affect are normal. Speech and behavior are normal.    ____________________________________________   DIFFERENTIAL includes but  not limited to  dehydration, hyponatremia, metabolic drainage, urinary tract infection, pyelonephritis, sepsis ____________________________________________   LABS (all labs ordered are listed, but only abnormal results are displayed)  Labs Reviewed  CBC - Abnormal; Notable for the following:       Result Value   HCT 34.8 (*)    MCHC 36.3 (*)    All other components within normal limits  COMPREHENSIVE METABOLIC PANEL - Abnormal; Notable for the following:    Sodium 116 (*)    Chloride 84 (*)    Glucose, Bld 101 (*)    AST 68 (*)    GFR calc non Af Amer 57 (*)    All other components within normal limits  URINALYSIS, COMPLETE (UACMP) WITH MICROSCOPIC - Abnormal; Notable for the following:    Color, Urine STRAW (*)    APPearance CLEAR (*)    Ketones, ur 20 (*)    All other components within normal limits  SODIUM - Abnormal; Notable for the following:    Sodium 119 (*)    All other components within normal limits  OSMOLALITY -  Abnormal; Notable for the following:    Osmolality 249 (*)    All other components within normal limits  TROPONIN I  TSH  OSMOLALITY, URINE  SODIUM, URINE, RANDOM  SODIUM  CORTISOL  BASIC METABOLIC PANEL  CBC    blood work reviewed and interpreted by me shows hypochloremic hyponatremia which is consistent with marked dehydration __________________________________________  EKG  ED ECG REPORT I, Darel Hong, the attending physician, personally viewed and interpreted this ECG.  Date: 01/11/2017 EKG Time:  Rate: 63 Rhythm: normal sinus rhythm QRS Axis: normal Intervals: normal ST/T Wave abnormalities: normal Narrative Interpretation: no evidence of acute ischemia  ____________________________________________  RADIOLOGY   ____________________________________________   PROCEDURES  Procedure(s) performed: no  Procedures  Critical Care performed: no  Observation: no ____________________________________________   INITIAL  IMPRESSION / ASSESSMENT AND PLAN / ED COURSE  Pertinent labs & imaging results that were available during my care of the patient were reviewed by me and considered in my medical decision making (see chart for details).  The patient arrives with a nonfocal exam although generalized malaise and fatigue. On review of previous urine culture she had Escherichia coli and Klebsiella which were both sensitive to Bactrim however she was unable to tolerate it. She is allergic to penicillin and both of her bacteria were sensitive to ciprofloxacin so I'll treat her with that now all labs are pending.  The patient's blood work shows significant hyponatremia and dehydration. She is artery been given 1 L of fluid and we will cautiously hydrate her so as not to raise her sodium too quickly. At this point given the degree of hyponatremia she requires inpatient admission for IV antibiotics and further care. The patient verbalizes understanding and agreement with the plan.      ____________________________________________   FINAL CLINICAL IMPRESSION(S) / ED DIAGNOSES  Final diagnoses:  Hyponatremia  Dehydration  Acute cystitis without hematuria      NEW MEDICATIONS STARTED DURING THIS VISIT:  Current Discharge Medication List       Note:  This document was prepared using Dragon voice recognition software and may include unintentional dictation errors.     Darel Hong, MD 01/11/17 2222

## 2017-01-11 NOTE — Telephone Encounter (Signed)
-----   Message from Trinna Post, [place] sent at 01/11/2017  1:20 PM EDT ----- Urine cx back with two organisms, both sensitive to bactrim. How is she feeling?

## 2017-01-11 NOTE — ED Notes (Signed)
She has verbalized a fall on Saturday  - denies hitting her head

## 2017-01-11 NOTE — Progress Notes (Signed)
CRITICAL VALUE STICKER  CRITICAL VALUE: chem osmolality 249  RECEIVER (on-site recipient of call):Ladawna Minco NOTIFIED: 2000 01/11/17  MESSENGER (representative from lab):  MD NOTIFIED: PRIME doc pager  TIME OF NOTIFICATION:2257  RESPONSE: pending

## 2017-01-11 NOTE — Progress Notes (Signed)
Pharmacy Antibiotic Note  [name] is a 83 y.o. female admitted on 01/11/2017 with UTI.  Pharmacy has been consulted for ciprofloxacin dosing.  Plan: Ciprofloxacin 400 mg IV Q12H  Height: 5\' 5"  (165.1 cm) Weight: 117 lb (53.1 kg) IBW/kg (Calculated) : 57  Temp (24hrs), Avg:97.3 F (36.3 C), Min:97.3 F (36.3 C), Max:97.3 F (36.3 C)   Recent Labs Lab 01/11/17 1418  WBC 7.2  CREATININE 0.88    Estimated Creatinine Clearance: 36.3 mL/min (by C-G formula based on SCr of 0.88 mg/dL).    Allergies  Allergen Reactions  . Accolate  [Zafirlukast]   . Metronidazole   . Nitrofurantoin Monohyd Macro Other (See Comments)    GI upset  . Penicillins Diarrhea  . Keflex [Cephalexin] Rash    Thank you for allowing pharmacy to be a part of this patient's care.  Laural Benes, Pharm.D., BCPS Clinical Pharmacist 01/11/2017 4:39 PM

## 2017-01-11 NOTE — ED Triage Notes (Signed)
She arrives via [facility] to triage with reports of recently being diagnosed with a urinary tract infection  - she began taking Septra and felt as if she was having a reaction to it so she stopped taking it on Saturday  She reports not feeling any better and desires a new antibiotic   She does report nausea with vomiting for the last two days

## 2017-01-12 LAB — BASIC METABOLIC PANEL
Anion gap: 6 (ref 5–15)
BUN: 13 mg/dL (ref 6–20)
CO2: 22 mmol/L (ref 22–32)
Calcium: 8.2 mg/dL — ABNORMAL LOW (ref 8.9–10.3)
Chloride: 93 mmol/L — ABNORMAL LOW (ref 101–111)
Creatinine, Ser: 0.77 mg/dL (ref 0.44–1.00)
GFR calc Af Amer: >60 mL/min (ref >60)
GFR calc non Af Amer: >60 mL/min (ref >60)
Glucose, Bld: 89 mg/dL (ref 65–99)
Potassium: 3.4 mmol/L — ABNORMAL LOW (ref 3.5–5.1)
Sodium: 121 mmol/L — ABNORMAL LOW (ref 135–145)

## 2017-01-12 LAB — CBC
HCT: 29.8 % — ABNORMAL LOW (ref 35.0–47.0)
Hemoglobin: 10.9 g/dL — ABNORMAL LOW (ref 12.0–16.0)
MCH: 32.5 pg (ref 26.0–34.0)
MCHC: 36.6 g/dL — ABNORMAL HIGH (ref 32.0–36.0)
MCV: 88.9 fL (ref 80.0–100.0)
Platelets: 154 10*3/uL (ref 150–440)
RBC: 3.35 MIL/uL — ABNORMAL LOW (ref 3.80–5.20)
RDW: 12.6 % (ref 11.5–14.5)
WBC: 6.4 10*3/uL (ref 3.6–11.0)

## 2017-01-12 LAB — SODIUM
Sodium: 121 mmol/L — ABNORMAL LOW (ref 135–145)
Sodium: 122 mmol/L — ABNORMAL LOW (ref 135–145)
Sodium: 123 mmol/L — ABNORMAL LOW (ref 135–145)

## 2017-01-12 LAB — CORTISOL: Cortisol, Plasma: 12.1 ug/dL

## 2017-01-12 MED ORDER — INFLUENZA VAC SPLIT HIGH-DOSE 0.5 ML IM SUSY
0.5000 mL | PREFILLED_SYRINGE | INTRAMUSCULAR | Status: AC
  Administered 2017-01-13: 0.5 mL via INTRAMUSCULAR
  Filled 2017-01-12: qty 0.5

## 2017-01-12 NOTE — Progress Notes (Signed)
Initial Nutrition Assessment  DOCUMENTATION CODES:   Severe malnutrition in context of chronic illness  INTERVENTION:  1. Ensure Enlive po BID, each supplement provides 350 kcal and 20 grams of protein  NUTRITION DIAGNOSIS:   Malnutrition (Severe) related to chronic illness as evidenced by severe depletion of body fat, severe depletion of muscle mass.  GOAL:   Patient will meet greater than or equal to 90% of their needs  MONITOR:   PO intake, I & O's, Labs, Supplement acceptance, Weight trends  REASON FOR ASSESSMENT:   Malnutrition Screening Tool    ASSESSMENT:   [name] is an 83 yo female with PMH HTN who presents with complaints of not feeling well, poor PO intake, nausea/vomiting, UTI, Hyponatremia  Spoke with [name] at bedside. She reports nausea/vomiting x3-4 days with very little PO intake. Prior to that illness she would eat cheerios and milk for breakfast, a cheese sandwich with gatorade and cookies for lunch and a cooked meal like chicken, green beans and mashed or creamed potatoes for dinner. Reports weight loss from 125 pounds to 117 pounds over 1 month (8 pounds/6.4% severe weight loss) stating "I can't eat as much as I used to." Weight appears stable per chart. Drank ensure in the past but states that it is too expensive to continue at home. Encouraged her to consume peanut butter, milk and add fats to her cooked meals such as olive oil and butter.  Had a scrambled egg, orange juice, toast, crannyberry juice, and apple sauce for breakfast this morning. Appetite seems to be improving.  Labs reviewed:  Na 121, K 3.4  Medications reviewed and include:  NS at 47mL/hr Dulcolax, Morphone, Senokot-S   Diet Order:  Diet regular Room service appropriate? Yes; Fluid consistency: Thin  Skin:  Reviewed, no issues  Last BM:  01/10/2017  Height:   Ht Readings from Last 1 Encounters:  01/11/17 5\' 5"  (1.651 m)    Weight:   Wt Readings from Last 1  Encounters:  01/11/17 117 lb (53.1 kg)    Ideal Body Weight:  56.81 kg  BMI:  Body mass index is 19.47 kg/m.  Estimated Nutritional Needs:   Kcal:  1250-1500 calories  Protein:  80-90 grams (1.5-1.7g/kg)  Fluid:  >1.5L  EDUCATION NEEDS:   Education needs addressed  [name] Anis. Ward, MS, RD LDN Inpatient Clinical Dietitian Pager [phone]

## 2017-01-12 NOTE — Consult Note (Signed)
Date: 01/12/2017                  Patient Name:  [name]  MRN: [id number]  DOB: [birth year]  Age / Sex: 83 y.o., female         PCP: Margo Common, PA                 Service Requesting Consult: IM/ Epifanio Lesches, MD                 Reason for Consult: hyponatremia            History of Present Illness: Patient is a 83 y.o. female with medical problems of HTN, who was admitted to [facility] on 01/11/2017 for evaluation of Nausea and Vomiting. She was found to have severe hyponatremia. Admitted for further E/M Patient states that she had a UTI.  His ago.  She was prescribed sulfa antibiotics by PMD.  She thinks he had a bad reaction causing severe nausea and vomiting and stomach upset.  She was not able to keep anything down by mouth  this morning she feels well.with IV hydration, her sodium level has improved to 121 this morning She does not have any leg edema No shortness of breath  Home medications include enalapril, hydrochlorothiazide and sulfamethoxazole  Medications: Outpatient medications: Prescriptions Prior to Admission  Medication Sig Dispense Refill Last Dose  . acetaminophen (TYLENOL) 325 MG tablet Take 2 tablets (650 mg total) by mouth every 6 (six) hours as needed for mild pain (or Fever >/= 101).   Taking  . enalapril (VASOTEC) 10 MG tablet TAKE ONE (1) TABLET BY MOUTH EVERY DAY 90 tablet 3 Taking  . hydrochlorothiazide (HYDRODIURIL) 25 MG tablet 1/2 to one pill daily as needed for ankle swellng 15 tablet 3 Taking  . metoprolol tartrate (LOPRESSOR) 25 MG tablet Take 0.5 tablets (12.5 mg total) by mouth 2 (two) times daily. 180 tablet 1 Taking  . sulfamethoxazole-trimethoprim (BACTRIM DS) 800-160 MG tablet Take 1 tablet by mouth 2 (two) times daily. 14 tablet 0   . traZODone (DESYREL) 100 MG tablet Take 1 tablet (100 mg total) by mouth at bedtime. 90 tablet 1 Taking    Current medications: Current Facility-Administered Medications  Medication Dose  Route Frequency Provider Last Rate Last Dose  . 0.9 %  sodium chloride infusion   Intravenous Continuous Bettey Costa, MD 75 mL/hr at 01/12/17 0639    . acetaminophen (TYLENOL) tablet 650 mg  650 mg Oral Q6H PRN Mody, Sital, MD      . bisacodyl (DULCOLAX) EC tablet 5 mg  5 mg Oral Daily PRN Mody, Sital, MD      . ciprofloxacin (CIPRO) IVPB 400 mg  400 mg Intravenous Q12H Bettey Costa, MD   Stopped at 01/12/17 0112  . enalapril (VASOTEC) tablet 10 mg  10 mg Oral Daily Bettey Costa, MD   10 mg at 01/11/17 1755  . enoxaparin (LOVENOX) injection 40 mg  40 mg Subcutaneous Q24H Mody, Sital, MD      . hydrALAZINE (APRESOLINE) injection 10 mg  10 mg Intravenous Q6H PRN Mody, Sital, MD      . HYDROcodone-acetaminophen (NORCO/VICODIN) 5-325 MG per tablet 1-2 tablet  1-2 tablet Oral Q4H PRN Bettey Costa, MD   2 tablet at 01/11/17 1957  . Influenza vac split quadrivalent PF (FLUZONE HIGH-DOSE) injection 0.5 mL  0.5 mL Intramuscular Tomorrow-1000 Mody, Sital, MD      . metoprolol tartrate (LOPRESSOR) tablet 12.5 mg  12.5 mg Oral BID Bettey Costa, MD   12.5 mg at 01/11/17 2125  . morphine 2 MG/ML injection 2 mg  2 mg Intravenous Q4H PRN Demetrios Loll, MD   2 mg at 01/11/17 2153  . ondansetron (ZOFRAN) tablet 4 mg  4 mg Oral Q6H PRN Bettey Costa, MD   4 mg at 01/11/17 1957   Or  . ondansetron (ZOFRAN) injection 4 mg  4 mg Intravenous Q6H PRN Mody, Sital, MD      . senna-docusate (Senokot-S) tablet 1 tablet  1 tablet Oral QHS PRN Bettey Costa, MD      . traZODone (DESYREL) tablet 100 mg  100 mg Oral QHS Bettey Costa, MD   100 mg at 01/11/17 2125      Allergies: Allergies  Allergen Reactions  . Accolate  [Zafirlukast]   . Metronidazole   . Nitrofurantoin Monohyd Macro Other (See Comments)    GI upset  . Penicillins Diarrhea  . Keflex [Cephalexin] Rash      Past Medical History: Past Medical History:  Diagnosis Date  . Breast cyst    removed  . Diverticulitis   . Gallstones   . Hypertension   . Low sodium  levels      Past Surgical History: Past Surgical History:  Procedure Laterality Date  . ABDOMINAL HYSTERECTOMY  2000  . APPENDECTOMY  1940's  . bladder tack  1990's  . BREAST CYST INCISION AND DRAINAGE Left 1990's  . CHOLECYSTECTOMY  09/11/13   Cholangiogram suggested a retained stone ductal dilatation.  Marland Kitchen ERCP W/ SPHICTEROTOMY  09/12/2013   Recurrent stone identified post cholecystectomy.  Marland Kitchen NASAL SINUS SURGERY       Family History: Family History  Problem Relation Age of Onset  . Hypertension Father   . CAD Father   . Cancer Brother        unknown type     Social History: Social History   Social History  . Marital status: Widowed    Spouse name: N/A  . Number of children: N/A  . Years of education: N/A   Occupational History  . Not on file.   Social History Main Topics  . Smoking status: Never Smoker  . Smokeless tobacco: Never Used  . Alcohol use No  . Drug use: No  . Sexual activity: Not on file   Other Topics Concern  . Not on file   Social History Narrative  . No narrative on file     Review of Systems: Gen: No fevers or chills HEENT: no vision or hearing problems CV: no chest pain or shortness of breath Resp: no cough or sputum production QJ:JHERDE nausea and vomiting after taking sulfa antibiotics GU : no dysuria MS: no acute joint pains Derm:  no complaints Psych:no complaints Heme: no complaints Neuro: no complaints Endocrine.  No complaints  Vital Signs: Blood pressure (!) 108/47, pulse (!) 57, temperature 97.8 F (36.6 C), temperature source Oral, resp. rate 20, height 5\' 5"  (1.651 m), weight 53.1 kg (117 lb), SpO2 99 %.   Intake/Output Summary (Last 24 hours) at 01/12/17 0946 Last data filed at 01/12/17 0639  Gross per 24 hour  Intake          1211.25 ml  Output                0 ml  Net          1211.25 ml    Weight trends: Filed Weights   01/11/17 1054  Weight: 53.1  kg (117 lb)    Physical Exam: General:  elderly  woman, laying in the bed  HEENT Anicteric, moist mucous membranes  Neck:  supple, no JVD  Lungs: Normal breathing effort, clear  Heart:: soft systolic murmur  Abdomen: nontender nondistended  Extremities:  no peripheral edema  Neurologic: Alert, oriented  Skin: No acute rashes, normal turgor             Lab results: Basic Metabolic Panel:  Recent Labs Lab 01/11/17 1418 01/11/17 1724 01/11/17 2227 01/12/17 0403  NA 116* 119* 120* 121*  K 4.3  --   --  3.4*  CL 84*  --   --  93*  CO2 24  --   --  22  GLUCOSE 101*  --   --  89  BUN 15  --   --  13  CREATININE 0.88  --   --  0.77  CALCIUM 9.3  --   --  8.2*    Liver Function Tests:  Recent Labs Lab 01/11/17 1418  AST 68*  ALT 22  ALKPHOS 77  BILITOT 1.1  PROT 6.9  ALBUMIN 4.1   No results for input(s): LIPASE, AMYLASE in the last 168 hours. No results for input(s): AMMONIA in the last 168 hours.  CBC:  Recent Labs Lab 01/11/17 1418 01/12/17 0403  WBC 7.2 6.4  HGB 12.6 10.9*  HCT 34.8* 29.8*  MCV 89.3 88.9  PLT 179 154    Cardiac Enzymes:  Recent Labs Lab 01/11/17 1420  TROPONINI <0.03    BNP: Invalid input(s): POCBNP  CBG: No results for input(s): GLUCAP in the last 168 hours.  Microbiology: Recent Results (from the past 720 hour(s))  Urine Culture     Status: Abnormal   Collection Time: 01/07/17  2:18 PM  Result Value Ref Range Status   MICRO NUMBER: [id number]  Final   SPECIMEN QUALITY: ADEQUATE  Final   Sample Source URINE, CLEAN CATCH  Final   STATUS: FINAL  Final   ISOLATE 1: Escherichia coli (A)  Final   ISOLATE 2: Klebsiella pneumoniae (A)  Final      Susceptibility   Escherichia coli - URINE CULTURE, REFLEX    AMOX/CLAVULANIC <=2 Sensitive     AMPICILLIN <=2 Sensitive     AMPICILLIN/SULBACTAM <=2 Sensitive     CEFAZOLIN* <=4 Not Reportable      * For infections other than uncomplicated UTIcaused by E. coli, K. pneumoniae or P. mirabilis:Cefazolin is resistant if MIC > or =  8 mcg/mL.(Distinguishing susceptible versus intermediatefor isolates with MIC < or = 4 mcg/mL requiresadditional testing.)For uncomplicated UTI caused by E. coli,K. pneumoniae or P. mirabilis: Cefazolin issusceptible if MIC <32 mcg/mL and predictssusceptible to the oral agents cefaclor, cefdinir,cefpodoxime, cefprozil, cefuroxime, cephalexinand loracarbef.    CEFEPIME <=1 Sensitive     CEFTRIAXONE <=1 Sensitive     CIPROFLOXACIN <=0.25 Sensitive     LEVOFLOXACIN <=0.12 Sensitive     ERTAPENEM <=0.5 Sensitive     GENTAMICIN <=1 Sensitive     IMIPENEM <=0.25 Sensitive     NITROFURANTOIN <=16 Sensitive     PIP/TAZO <=4 Sensitive     TOBRAMYCIN <=1 Sensitive     TRIMETH/SULFA <=20 Sensitive    Klebsiella pneumoniae - URINE CULTURE NEGATIVE 2    AMOX/CLAVULANIC <=2 Sensitive     AMPICILLIN  Resistant     AMPICILLIN/SULBACTAM 4 Sensitive     CEFAZOLIN <=4 Not Reportable     CEFEPIME <=1 Sensitive     CEFTRIAXONE <=  1 Sensitive     CIPROFLOXACIN <=0.25 Sensitive     LEVOFLOXACIN <=0.12 Sensitive     ERTAPENEM <=0.5 Sensitive     GENTAMICIN <=1 Sensitive     IMIPENEM <=0.25 Sensitive     NITROFURANTOIN 64 Intermediate     PIP/TAZO <=4 Sensitive     TOBRAMYCIN <=1 Sensitive     TRIMETH/SULFA* <=20 Sensitive      * For infections other than uncomplicated UTIcaused by E. coli, K. pneumoniae or P. mirabilis:Cefazolin is resistant if MIC > or = 8 mcg/mL.(Distinguishing susceptible versus intermediatefor isolates with MIC < or = 4 mcg/mL requiresadditional testing.)For uncomplicated UTI caused by E. coli,K. pneumoniae or P. mirabilis: Cefazolin issusceptible if MIC <32 mcg/mL and predictssusceptible to the oral agents cefaclor, cefdinir,cefpodoxime, cefprozil, cefuroxime, cephalexinand loracarbef.Legend:S = Susceptible  I = IntermediateR = Resistant  NS = Not susceptible* = Not tested  NR = Not reported**NN = See antimicrobic comments     Coagulation Studies: No results for input(s): LABPROT, INR in  the last 72 hours.  Urinalysis:  Recent Labs  01/11/17 1418  COLORURINE STRAW*  LABSPEC 1.009  PHURINE 6.0  GLUCOSEU NEGATIVE  HGBUR NEGATIVE  BILIRUBINUR NEGATIVE  KETONESUR 20*  PROTEINUR NEGATIVE  NITRITE NEGATIVE  LEUKOCYTESUR NEGATIVE        Imaging:  No results found.   Assessment & Plan: Pt is a 83 y.o. Caucasian  female with hypertension, was admitted on 01/11/2017 with severe nausea and vomiting after taking sulfa antibiotics for a UTI.   1.  Hyponatremia Likely secondary to volume depletion from severe nausea and vomiting Sodium level is improving her satisfactory rate with IV normal saline Hold hydrochlorothiazide.  May discontinue for use as outpatient  2.  UTI Escherichia coli and Klebsiella Sensitive to Cipro Patient has tolerated that before  3.  Hypokalemia Expected to improve with current treatment and improved oral intake  Will follow

## 2017-01-12 NOTE — Progress Notes (Signed)
PT Cancellation Note  Patient Details Name: [name] MRN: [id number] DOB: 08/15/1924   Cancelled Treatment:    Reason Eval/Treat Not Completed: Medical issues which prohibited therapy Pt still with critically low sodium levels 121 earlier this AM, rechecked mid morning with no change.  Will try back at a later date when pt is appropriate.   Kreg Shropshire, DPT 01/12/2017, 1:24 PM

## 2017-01-12 NOTE — Progress Notes (Signed)
[facility] [place] NAME: [name]    MR#:  [id number]  DATE OF BIRTH:  [birth year]  SUBJECTIVE: Seen at bedside, admitted for hyponatremia. Patient with nausea and vomiting after starting Bactrim a week ago.  No further nausea or vomiting.   CHIEF COMPLAINT:   Chief Complaint  Patient presents with  . Urinary Tract Infection  . Dehydration    REVIEW OF SYSTEMS:    Review of Systems  Constitutional: Negative for chills and fever.  HENT: Negative for hearing loss.   Eyes: Negative for blurred vision, double vision and photophobia.  Respiratory: Negative for cough, hemoptysis and shortness of breath.   Cardiovascular: Negative for palpitations, orthopnea and leg swelling.  Gastrointestinal: Negative for abdominal pain, diarrhea and vomiting.  Genitourinary: Negative for dysuria and urgency.  Musculoskeletal: Negative for myalgias and neck pain.  Skin: Negative for rash.  Neurological: Negative for dizziness, focal weakness, seizures, weakness and headaches.  Psychiatric/Behavioral: Negative for memory loss. The patient does not have insomnia.     Nutrition:  Tolerating Diet: Tolerating PT:      DRUG ALLERGIES:   Allergies  Allergen Reactions  . Accolate  [Zafirlukast]   . Bactrim [Sulfamethoxazole-Trimethoprim] Nausea And Vomiting  . Metronidazole   . Nitrofurantoin Monohyd Macro Other (See Comments)    GI upset  . Penicillins Diarrhea  . Keflex [Cephalexin] Rash    VITALS:  Blood pressure (!) 108/47, pulse (!) 57, temperature 97.8 F (36.6 C), temperature source Oral, resp. rate 20, height 5\' 5"  (1.651 m), weight 53.1 kg (117 lb), SpO2 99 %.  PHYSICAL EXAMINATION:   Physical Exam  GENERAL:  83 y.o.-year-old patient lying in the bed with no acute distress.  EYES: Pupils equal, round, reactive to light and accommodation. No scleral icterus. Extraocular muscles intact.  HEENT: Head atraumatic,  normocephalic. Oropharynx and nasopharynx clear.  NECK:  Supple, no jugular venous distention. No thyroid enlargement, no tenderness.  LUNGS: Normal breath sounds bilaterally, no wheezing, rales,rhonchi or crepitation. No use of accessory muscles of respiration.  CARDIOVASCULAR: S1, S2 normal. No murmurs, rubs, or gallops.  ABDOMEN: Soft, nontender, nondistended. Bowel sounds present. No organomegaly or mass.  EXTREMITIES: No pedal edema, cyanosis, or clubbing.  NEUROLOGIC: Cranial nerves II through XII are intact. Muscle strength 5/5 in all extremities. Sensation intact. Gait not checked.  PSYCHIATRIC: The patient is alert and oriented x 3.  SKIN: No obvious rash, lesion, or ulcer.    LABORATORY PANEL:   CBC  Recent Labs Lab 01/12/17 0403  WBC 6.4  HGB 10.9*  HCT 29.8*  PLT 154   ------------------------------------------------------------------------------------------------------------------  Chemistries   Recent Labs Lab 01/11/17 1418  01/12/17 0403 01/12/17 1034  NA 116*  < > 121* 121*  K 4.3  --  3.4*  --   CL 84*  --  93*  --   CO2 24  --  22  --   GLUCOSE 101*  --  89  --   BUN 15  --  13  --   CREATININE 0.88  --  0.77  --   CALCIUM 9.3  --  8.2*  --   AST 68*  --   --   --   ALT 22  --   --   --   ALKPHOS 77  --   --   --   BILITOT 1.1  --   --   --   < > = values in this  interval not displayed. ------------------------------------------------------------------------------------------------------------------  Cardiac Enzymes  Recent Labs Lab 01/11/17 1420  TROPONINI <0.03   ------------------------------------------------------------------------------------------------------------------  RADIOLOGY:  No results found.   ASSESSMENT AND PLAN:   Active Problems:   Hyponatremia   1.Acute hyponatremia secondary to dehydration with nausea, vomiting and poor by mouth intake for 7 days: Continue IV fluids and sodium improved to 121 from 116/  2.E  coli, Klebsiella UTI: Chest continue Cipro, likely discharge in next 1-2 days Cipro.  #3 essential hypertension: Controlled, metoprolol, enalapril. #4. generalized weakness: Continue physical therapy.    All the records are reviewed and case discussed with Care Management/Social Workerr. Management plans discussed with the patient, family and they are in agreement.  CODE STATUS: Full code  TOTAL TIME TAKING CARE OF THIS PATIENT: 35 minutes.   POSSIBLE D/C IN 1-2 DAYS, DEPENDING ON CLINICAL CONDITION.   Epifanio Lesches M.D on 01/12/2017 at 1:39 PM  Between 7am to 6pm - Pager - [phone]  After 6pm go to [email or web address] - password [email or web address] [facility]  Office  [phone]  CC: Primary care physician; Margo Common, PA

## 2017-01-13 LAB — SODIUM
Sodium: 124 mmol/L — ABNORMAL LOW (ref 135–145)
Sodium: 125 mmol/L — ABNORMAL LOW (ref 135–145)
Sodium: 126 mmol/L — ABNORMAL LOW (ref 135–145)
Sodium: 126 mmol/L — ABNORMAL LOW (ref 135–145)

## 2017-01-13 MED ORDER — BOOST / RESOURCE BREEZE PO LIQD
1.0000 | Freq: Three times a day (TID) | ORAL | Status: DC
  Administered 2017-01-13 – 2017-01-14 (×3): 1 via ORAL

## 2017-01-13 NOTE — Progress Notes (Signed)
[facility], [place] 01/13/17  Subjective:   Patient is doing fair Sitting up in the chair Able to eat some. Serum sodium has improved to 124 this morning Continued on IV fluids  Objective:  Vital signs in last 24 hours:  Temp:  [97.8 F (36.6 C)-98.3 F (36.8 C)] 97.8 F (36.6 C) (10/03 1400) Pulse Rate:  [58-67] 63 (10/03 1400) Resp:  [20] 20 (10/03 1400) BP: (117-168)/(52-59) 168/57 (10/03 1400) SpO2:  [95 %-97 %] 97 % (10/03 1400)  Weight change:  Filed Weights   01/11/17 1054  Weight: 53.1 kg (117 lb)    Intake/Output:    Intake/Output Summary (Last 24 hours) at 01/13/17 1517 Last data filed at 01/13/17 1300  Gross per 24 hour  Intake             1715 ml  Output              700 ml  Net             1015 ml   Physical Exam: General:  elderly woman,sitting up in chair  HEENT Anicteric, moist mucous membranes  Neck:  supple, no distended neck veins  Lungs: Normal breathing effort, clear  Heart:: soft systolic murmur  Abdomen: nontender nondistended  Extremities:  no peripheral edema  Neurologic: Alert, oriented  Skin: No acute rashes, normal turgor       Basic Metabolic Panel:   Recent Labs Lab 01/11/17 1418  01/12/17 0403 01/12/17 1034 01/12/17 1526 01/12/17 2306 01/13/17 0550 01/13/17 1012  NA 116*  < > 121* 121* 122* 123* 124* 125*  K 4.3  --  3.4*  --   --   --   --   --   CL 84*  --  93*  --   --   --   --   --   CO2 24  --  22  --   --   --   --   --   GLUCOSE 101*  --  89  --   --   --   --   --   BUN 15  --  13  --   --   --   --   --   CREATININE 0.88  --  0.77  --   --   --   --   --   CALCIUM 9.3  --  8.2*  --   --   --   --   --   < > = values in this interval not displayed.   CBC:  Recent Labs Lab 01/11/17 1418 01/12/17 0403  WBC 7.2 6.4  HGB 12.6 10.9*  HCT 34.8* 29.8*  MCV 89.3 88.9  PLT 179 154     No results found for: HEPBSAG, HEPBSAB, HEPBIGM    Microbiology:  Recent Results  (from the past 240 hour(s))  Urine Culture     Status: Abnormal   Collection Time: 01/07/17  2:18 PM  Result Value Ref Range Status   MICRO NUMBER: [id number]  Final   SPECIMEN QUALITY: ADEQUATE  Final   Sample Source URINE, CLEAN CATCH  Final   STATUS: FINAL  Final   ISOLATE 1: Escherichia coli (A)  Final   ISOLATE 2: Klebsiella pneumoniae (A)  Final      Susceptibility   Escherichia coli - URINE CULTURE, REFLEX    AMOX/CLAVULANIC <=2 Sensitive     AMPICILLIN <=2 Sensitive     AMPICILLIN/SULBACTAM <=2 Sensitive  CEFAZOLIN* <=4 Not Reportable      * For infections other than uncomplicated UTIcaused by E. coli, K. pneumoniae or P. mirabilis:Cefazolin is resistant if MIC > or = 8 mcg/mL.(Distinguishing susceptible versus intermediatefor isolates with MIC < or = 4 mcg/mL requiresadditional testing.)For uncomplicated UTI caused by E. coli,K. pneumoniae or P. mirabilis: Cefazolin issusceptible if MIC <32 mcg/mL and predictssusceptible to the oral agents cefaclor, cefdinir,cefpodoxime, cefprozil, cefuroxime, cephalexinand loracarbef.    CEFEPIME <=1 Sensitive     CEFTRIAXONE <=1 Sensitive     CIPROFLOXACIN <=0.25 Sensitive     LEVOFLOXACIN <=0.12 Sensitive     ERTAPENEM <=0.5 Sensitive     GENTAMICIN <=1 Sensitive     IMIPENEM <=0.25 Sensitive     NITROFURANTOIN <=16 Sensitive     PIP/TAZO <=4 Sensitive     TOBRAMYCIN <=1 Sensitive     TRIMETH/SULFA <=20 Sensitive    Klebsiella pneumoniae - URINE CULTURE NEGATIVE 2    AMOX/CLAVULANIC <=2 Sensitive     AMPICILLIN  Resistant     AMPICILLIN/SULBACTAM 4 Sensitive     CEFAZOLIN <=4 Not Reportable     CEFEPIME <=1 Sensitive     CEFTRIAXONE <=1 Sensitive     CIPROFLOXACIN <=0.25 Sensitive     LEVOFLOXACIN <=0.12 Sensitive     ERTAPENEM <=0.5 Sensitive     GENTAMICIN <=1 Sensitive     IMIPENEM <=0.25 Sensitive     NITROFURANTOIN 64 Intermediate     PIP/TAZO <=4 Sensitive     TOBRAMYCIN <=1 Sensitive     TRIMETH/SULFA* <=20 Sensitive       * For infections other than uncomplicated UTIcaused by E. coli, K. pneumoniae or P. mirabilis:Cefazolin is resistant if MIC > or = 8 mcg/mL.(Distinguishing susceptible versus intermediatefor isolates with MIC < or = 4 mcg/mL requiresadditional testing.)For uncomplicated UTI caused by E. coli,K. pneumoniae or P. mirabilis: Cefazolin issusceptible if MIC <32 mcg/mL and predictssusceptible to the oral agents cefaclor, cefdinir,cefpodoxime, cefprozil, cefuroxime, cephalexinand loracarbef.Legend:S = Susceptible  I = IntermediateR = Resistant  NS = Not susceptible* = Not tested  NR = Not reported**NN = See antimicrobic comments    Coagulation Studies: No results for input(s): LABPROT, INR in the last 72 hours.  Urinalysis:  Recent Labs  01/11/17 1418  COLORURINE STRAW*  LABSPEC 1.009  PHURINE 6.0  GLUCOSEU NEGATIVE  HGBUR NEGATIVE  BILIRUBINUR NEGATIVE  KETONESUR 20*  PROTEINUR NEGATIVE  NITRITE NEGATIVE  LEUKOCYTESUR NEGATIVE      Imaging: No results found.   Medications:   . sodium chloride Stopped (01/13/17 1115)  . ciprofloxacin Stopped (01/13/17 1109)   . enalapril  10 mg Oral Daily  . enoxaparin (LOVENOX) injection  40 mg Subcutaneous Q24H  . metoprolol tartrate  12.5 mg Oral BID  . traZODone  100 mg Oral QHS   acetaminophen, bisacodyl, hydrALAZINE, HYDROcodone-acetaminophen, morphine injection, ondansetron **OR** ondansetron (ZOFRAN) IV, senna-docusate  Assessment/ Plan:  83 y.o. female  with hypertension, was admitted on 01/11/2017 with severe nausea and vomiting after taking sulfa antibiotics for a UTI.   1.  Hyponatremia Likely secondary to volume depletion from severe nausea and vomiting Sodium level is improving with IV normal saline, although slow correction today May discontinue hydrochlorothiazide for use as outpatient  2.  UTI Escherichia coli and Klebsiella Sensitive to Cipro Patient has tolerated that before  3.  Hypokalemia Expected to improve  with current treatment and improved oral intake  Will follow   LOS: 2 [facility] 10/3/20183:17 PM  [place], [place]

## 2017-01-13 NOTE — Progress Notes (Signed)
Physical Therapy Evaluation Patient Details Name: [name] MRN: [id number] DOB: 02-Feb-1925 Today's Date: 01/13/2017   History of Present Illness  Pt admitted on 10/1 for hyponatremia, secondary to persistent nausea and vomiting due to antiobiotics for UTI at end of September. PMH of HTN.   Clinical Impression  Pt is a pleasant 83 year old female admitted for hyponatremia. Pt performs bed mobility with mod I., transfers/amb with RW with CGA. Pt amb a total of 40 ft, amb to doorway in room and back. Pt amb very slowly, appeared to fatigue, did not amb further. No dizziness, SOB or LOB. Pt amb with a cane at baseline, however would recommend use of RW for extra support and safety. Pt demonstrates deficits with LE strength/endurance, bed mobility, transfers and amb. Would benefit from skilled PT to address above deficits and promote optimal return to home. Recommend transition to [place] upon DC from acute hospitalization.     Follow Up Recommendations Home health PT    Equipment Recommendations  None recommended by PT (Pt has RW at home)    Recommendations for Other Services       Precautions / Restrictions Precautions Precautions: Fall Restrictions Weight Bearing Restrictions: No      Mobility  Bed Mobility Overal bed mobility: Modified Independent             General bed mobility comments: Utilizes bed rail, requires additional time   Transfers Overall transfer level: Needs assistance Equipment used: Rolling walker (2 wheeled) Transfers: Sit to/from Stand Sit to Stand: Min guard         General transfer comment: Pt able to rise from seated EOB to standing with proper technique, cues for hand placement. No dizziness or LOB.   Ambulation/Gait Ambulation/Gait assistance: Min guard Ambulation Distance (Feet): 40 Feet Assistive device: Rolling walker (2 wheeled) Gait Pattern/deviations: Step-to pattern     General Gait Details: Pt amb very slowly, cues for proper  sequencing and to keep RW closer to body. Pt unable to amb further due to LE strength/endurance. No rest breaks needed. No SOB, dizziness or LOB.   Stairs            Wheelchair Mobility    Modified Rankin (Stroke Patients Only)       Balance Overall balance assessment: Needs assistance Sitting-balance support: Feet unsupported (Feet did not reach the floor) Sitting balance-Leahy Scale: Good Sitting balance - Comments: Pt able to sit at EOB and maintain position with supervision. No dizziness or LOB.        Standing balance comment: Pt able to stand at EOB with RW with CGA. No dizziness or LOB.                             Pertinent Vitals/Pain Pain Assessment: No/denies pain    Home Living Family/patient expects to be discharged to:: Private residence Living Arrangements: Children Available Help at Discharge: Family (Son is a double Press photographer, utilizes WC for amb) Type of Home: House Home Access: Ramped entrance     Home Layout: One level Home Equipment: Environmental consultant - 2 wheels;Cane - single point Additional Comments: Pt utilizes a SPC for amb within the home, furniture walker, has a RW but does not use.     Prior Function Level of Independence: Needs assistance         Comments: Pt amb within home with cane, able to perform toileting/bathing independently. Pt and her son prep meals together.  Pt states she would probably be safer using a RW to amb.     Hand Dominance        Extremity/Trunk Assessment   Upper Extremity Assessment Upper Extremity Assessment: Generalized weakness (UE MMT grossly 4/5)    Lower Extremity Assessment Lower Extremity Assessment: Generalized weakness (LE MMT grossly 4/5)       Communication   Communication: No difficulties  Cognition Arousal/Alertness: Awake/alert Behavior During Therapy: WFL for tasks assessed/performed Overall Cognitive Status: Within Functional Limits for tasks assessed                                         General Comments      Exercises Other Exercises Other Exercises: Supine ther-ex, 10x both sides, ankle pumps, hip abd/add, SLRs. Pt required cues for technique, min assist to initiate exercises on LLE.    Assessment/Plan    PT Assessment Patient needs continued PT services  PT Problem List Decreased strength;Decreased range of motion;Decreased mobility;Decreased knowledge of use of DME       PT Treatment Interventions DME instruction;Gait training;Functional mobility training;Therapeutic activities;Therapeutic exercise;Patient/family education    PT Goals (Current goals can be found in the Care Plan section)  Acute Rehab PT Goals Patient Stated Goal: to return home PT Goal Formulation: With patient Time For Goal Achievement: 01/27/17 Potential to Achieve Goals: Good    Frequency Min 2X/week   Barriers to discharge        Co-evaluation               AM-PAC PT "6 Clicks" Daily Activity  Outcome Measure Difficulty turning over in bed (including adjusting bedclothes, sheets and blankets)?: None Difficulty moving from lying on back to sitting on the side of the bed? : None Difficulty sitting down on and standing up from a chair with arms (e.g., wheelchair, bedside commode, etc,.)?: Unable Help needed moving to and from a bed to chair (including a wheelchair)?: A Little Help needed walking in hospital room?: A Little Help needed climbing 3-5 steps with a railing? : A Lot 6 Click Score: 17    End of Session Equipment Utilized During Treatment: Gait belt Activity Tolerance: Patient tolerated treatment well Patient left: in chair;with chair alarm set;with call bell/phone within reach Nurse Communication: Mobility status PT Visit Diagnosis: Other abnormalities of gait and mobility (R26.89);Muscle weakness (generalized) (M62.81)    Time: 7096-2836 PT Time Calculation (min) (ACUTE ONLY): 39 min   Charges:         PT G Codes:   PT G-Codes  **NOT FOR INPATIENT CLASS** Functional Assessment Tool Used: AM-PAC 6 Clicks Basic Mobility Functional Limitation: Mobility: Walking and moving around Mobility: Walking and Moving Around Current Status (O2947): At least 40 percent but less than 60 percent impaired, limited or restricted Mobility: Walking and Moving Around Goal Status [phone]): At least 20 percent but less than 40 percent impaired, limited or restricted    Manfred Arch, SPT  Manfred Arch 01/13/2017, 12:28 PM

## 2017-01-13 NOTE — Plan of Care (Signed)
Problem: Health Behavior/Discharge Planning: Goal: Ability to manage health-related needs will improve Outcome: Progressing Pt up with to sink for personal care with standby assist.

## 2017-01-13 NOTE — Progress Notes (Signed)
[facility] at [place] NAME: [name]    MR#:  [id number]  DATE OF BIRTH:  12-05-1924  SUBJECTIVE: Seen at bedside, admitted for hyponatremia. Patient with nausea and vomiting after starting Bactrim a week ago.  No further nausea or vomiting. Sodium number is improving.   CHIEF COMPLAINT:   Chief Complaint  Patient presents with  . Urinary Tract Infection  . Dehydration    REVIEW OF SYSTEMS:    Review of Systems  Constitutional: Negative for chills and fever.  HENT: Negative for hearing loss.   Eyes: Negative for blurred vision, double vision and photophobia.  Respiratory: Negative for cough, hemoptysis and shortness of breath.   Cardiovascular: Negative for palpitations, orthopnea and leg swelling.  Gastrointestinal: Negative for abdominal pain, diarrhea and vomiting.  Genitourinary: Negative for dysuria and urgency.  Musculoskeletal: Negative for myalgias and neck pain.  Skin: Negative for rash.  Neurological: Negative for dizziness, focal weakness, seizures, weakness and headaches.  Psychiatric/Behavioral: Negative for memory loss. The patient does not have insomnia.     Nutrition:  Tolerating Diet: Tolerating PT:      DRUG ALLERGIES:   Allergies  Allergen Reactions  . Accolate  [Zafirlukast]   . Bactrim [Sulfamethoxazole-Trimethoprim] Nausea And Vomiting  . Metronidazole   . Nitrofurantoin Monohyd Macro Other (See Comments)    GI upset  . Penicillins Diarrhea  . Keflex [Cephalexin] Rash    VITALS:  Blood pressure (!) 117/52, pulse (!) 58, temperature 97.8 F (36.6 C), temperature source Oral, resp. rate 20, height 5\' 5"  (1.651 m), weight 53.1 kg (117 lb), SpO2 95 %.  PHYSICAL EXAMINATION:   Physical Exam  GENERAL:  83 y.o.-year-old patient lying in the bed with no acute distress.  EYES: Pupils equal, round, reactive to light and accommodation. No scleral icterus. Extraocular muscles intact.  HEENT:  Head atraumatic, normocephalic. Oropharynx and nasopharynx clear.  NECK:  Supple, no jugular venous distention. No thyroid enlargement, no tenderness.  LUNGS: Normal breath sounds bilaterally, no wheezing, rales,rhonchi or crepitation. No use of accessory muscles of respiration.  CARDIOVASCULAR: S1, S2 normal. No murmurs, rubs, or gallops.  ABDOMEN: Soft, nontender, nondistended. Bowel sounds present. No organomegaly or mass.  EXTREMITIES: No pedal edema, cyanosis, or clubbing.  NEUROLOGIC: Cranial nerves II through XII are intact. Muscle strength 5/5 in all extremities. Sensation intact. Gait not checked.  PSYCHIATRIC: The patient is alert and oriented x 3.  SKIN: No obvious rash, lesion, or ulcer.    LABORATORY PANEL:   CBC  Recent Labs Lab 01/12/17 0403  WBC 6.4  HGB 10.9*  HCT 29.8*  PLT 154   ------------------------------------------------------------------------------------------------------------------  Chemistries   Recent Labs Lab 01/11/17 1418  01/12/17 0403  01/13/17 1012  NA 116*  < > 121*  < > 125*  K 4.3  --  3.4*  --   --   CL 84*  --  93*  --   --   CO2 24  --  22  --   --   GLUCOSE 101*  --  89  --   --   BUN 15  --  13  --   --   CREATININE 0.88  --  0.77  --   --   CALCIUM 9.3  --  8.2*  --   --   AST 68*  --   --   --   --   ALT 22  --   --   --   --  ALKPHOS 77  --   --   --   --   BILITOT 1.1  --   --   --   --   < > = values in this interval not displayed. ------------------------------------------------------------------------------------------------------------------  Cardiac Enzymes  Recent Labs Lab 01/11/17 1420  TROPONINI <0.03   ------------------------------------------------------------------------------------------------------------------  RADIOLOGY:  No results found.   ASSESSMENT AND PLAN:   Active Problems:   Hyponatremia   1.Acute hyponatremia secondary to dehydration with nausea, vomiting and poor by mouth intake  for 7 days: Continue IV fluids and sodium improved to 124 from 116/ IV fluids for today, possible discharge tomorrow. 2.E coli, Klebsiella UTI: Chest continue Cipro, likely discharge in next 1-2 days Cipro.  #3 essential hypertension: Controlled, metoprolol, enalapril. #4. generalized weakness: Occult therapy consult. #5 .severe malnutrition: Continue ensure supplements.  All the records are reviewed and case discussed with Care Management/Social Workerr. Management plans discussed with the patient, family and they are in agreement.  CODE STATUS: Full code  TOTAL TIME TAKING CARE OF THIS PATIENT: 35 minutes.   POSSIBLE D/C IN 1-2 DAYS, DEPENDING ON CLINICAL CONDITION.   Epifanio Lesches M.D on 01/13/2017 at 12:38 PM  Between 7am to 6pm - Pager - [phone]  After 6pm go to [email or web address] - password [email or web address] [facility]  Office  [phone]  CC: Primary care physician; Margo Common, PA

## 2017-01-14 LAB — OSMOLALITY: Osmolality: 262 mOsm/kg — ABNORMAL LOW (ref 275–295)

## 2017-01-14 LAB — BASIC METABOLIC PANEL
Anion gap: 4 — ABNORMAL LOW (ref 5–15)
BUN: 20 mg/dL (ref 6–20)
CO2: 22 mmol/L (ref 22–32)
Calcium: 8.2 mg/dL — ABNORMAL LOW (ref 8.9–10.3)
Chloride: 101 mmol/L (ref 101–111)
Creatinine, Ser: 0.93 mg/dL (ref 0.44–1.00)
GFR calc Af Amer: >60 mL/min (ref >60)
GFR calc non Af Amer: 53 mL/min — ABNORMAL LOW (ref >60)
Glucose, Bld: 102 mg/dL — ABNORMAL HIGH (ref 65–99)
Potassium: 3.5 mmol/L (ref 3.5–5.1)
Sodium: 127 mmol/L — ABNORMAL LOW (ref 135–145)

## 2017-01-14 LAB — PROTEIN / CREATININE RATIO, URINE
Creatinine, Urine: 47 mg/dL
Total Protein, Urine: <6 mg/dL

## 2017-01-14 LAB — NA AND K (SODIUM & POTASSIUM), RAND UR
Potassium Urine: 14 mmol/L
Sodium, Ur: 39 mmol/L

## 2017-01-14 LAB — OSMOLALITY, URINE: Osmolality, Ur: 291 mOsm/kg — ABNORMAL LOW (ref 300–900)

## 2017-01-14 LAB — SODIUM: Sodium: 127 mmol/L — ABNORMAL LOW (ref 135–145)

## 2017-01-14 LAB — CHLORIDE, URINE, RANDOM: Chloride Urine: 52 mmol/L

## 2017-01-14 LAB — CREATININE, URINE, RANDOM: Creatinine, Urine: 48 mg/dL

## 2017-01-14 MED ORDER — ENALAPRIL MALEATE 10 MG PO TABS: 10.0000 mg | ORAL_TABLET | Freq: Every day | ORAL | 0 refills | Status: DC

## 2017-01-14 MED ORDER — CIPROFLOXACIN HCL 500 MG PO TABS: 250.0000 mg | ORAL_TABLET | Freq: Two times a day (BID) | ORAL | Status: DC

## 2017-01-14 MED ORDER — CIPROFLOXACIN HCL 250 MG PO TABS: 250.0000 mg | ORAL_TABLET | Freq: Two times a day (BID) | ORAL | 0 refills | Status: DC

## 2017-01-14 MED ORDER — BOOST / RESOURCE BREEZE PO LIQD: 1.0000 | Freq: Three times a day (TID) | ORAL | 0 refills | Status: DC

## 2017-01-14 NOTE — Care Management Note (Signed)
Case Management Note  Patient Details  Name: [name] MRN: [id number] Date of Birth: 03-22-1925  Subjective/Objective:    Admitted to [facility] with the diagnosis of   Hyponatremia. Son [name] lives in the home [phone]). Last seen Dr. Jeananne Rama 09/08/16. Prescriptions are filled at [facility].   Home Health 4 months ago. Doesn't remember name of agency, No skilled facility, No home oxygen. Rolling walker in the home. Takes care of all basic and instrumental activities of daily living herself, drives to the store and to get hair fixed, Last fall was 3-4 months ago. Appetite comes and goes. Family or friend will transport.             Action/Plan: Physical therapy evaluation completed. Recommending home health and physical therapy. Declining these services at this time   Expected Discharge Date:                  Expected Discharge Plan:     In-House Referral:     Discharge planning Services     Post Acute Care Choice:   yes Choice offered to:   patient  DME Arranged:    DME Agency:     HH Arranged:    [place] Agency:     Status of Service:     If discussed at [employer], dates discussed:    Additional Comments: Declining services at this time.  Shelbie Ammons, RN MSN CCM Care Management [phone] 01/14/2017, 9:24 AM

## 2017-01-14 NOTE — Progress Notes (Signed)
[facility], [place] 01/14/17  Subjective:   Patient is doing fair  no nausea or vomiting reported Serum sodium has improved to 127 this morning Continued on IV fluids  Objective:  Vital signs in last 24 hours:  Temp:  [97.6 F (36.4 C)-98.4 F (36.9 C)] 98.4 F (36.9 C) (10/04 0814) Pulse Rate:  [55-65] 56 (10/04 0814) Resp:  [14-20] 14 (10/04 0448) BP: (104-168)/(39-57) 116/39 (10/04 0814) SpO2:  [96 %-97 %] 96 % (10/04 0814)  Weight change:  Filed Weights   01/11/17 1054  Weight: 53.1 kg (117 lb)    Intake/Output:    Intake/Output Summary (Last 24 hours) at 01/14/17 1121 Last data filed at 01/14/17 1052  Gross per 24 hour  Intake          3710.43 ml  Output                0 ml  Net          3710.43 ml   Physical Exam: General:  frail elderly woman,   HEENT Anicteric, moist mucous membranes  Neck:  supple, no distended neck veins  Lungs: Normal breathing effort, clear  Heart:: soft systolic murmur  Abdomen: nontender nondistended  Extremities:  no peripheral edema  Neurologic: Alert, oriented  Skin: No acute rashes, normal turgor       Basic Metabolic Panel:   Recent Labs Lab 01/11/17 1418  01/12/17 0403  01/13/17 1012 01/13/17 1632 01/13/17 2226 01/14/17 0428 01/14/17 1025  NA 116*  < > 121*  < > 125* 126* 126* 127* 127*  K 4.3  --  3.4*  --   --   --   --  3.5  --   CL 84*  --  93*  --   --   --   --  101  --   CO2 24  --  22  --   --   --   --  22  --   GLUCOSE 101*  --  89  --   --   --   --  102*  --   BUN 15  --  13  --   --   --   --  20  --   CREATININE 0.88  --  0.77  --   --   --   --  0.93  --   CALCIUM 9.3  --  8.2*  --   --   --   --  8.2*  --   < > = values in this interval not displayed.   CBC:  Recent Labs Lab 01/11/17 1418 01/12/17 0403  WBC 7.2 6.4  HGB 12.6 10.9*  HCT 34.8* 29.8*  MCV 89.3 88.9  PLT 179 154     No results found for: HEPBSAG, HEPBSAB,  HEPBIGM    Microbiology:  Recent Results (from the past 240 hour(s))  Urine Culture     Status: Abnormal   Collection Time: 01/07/17  2:18 PM  Result Value Ref Range Status   MICRO NUMBER: [id number]  Final   SPECIMEN QUALITY: ADEQUATE  Final   Sample Source URINE, CLEAN CATCH  Final   STATUS: FINAL  Final   ISOLATE 1: Escherichia coli (A)  Final   ISOLATE 2: Klebsiella pneumoniae (A)  Final      Susceptibility   Escherichia coli - URINE CULTURE, REFLEX    AMOX/CLAVULANIC <=2 Sensitive     AMPICILLIN <=2 Sensitive  AMPICILLIN/SULBACTAM <=2 Sensitive     CEFAZOLIN* <=4 Not Reportable      * For infections other than uncomplicated UTIcaused by E. coli, K. pneumoniae or P. mirabilis:Cefazolin is resistant if MIC > or = 8 mcg/mL.(Distinguishing susceptible versus intermediatefor isolates with MIC < or = 4 mcg/mL requiresadditional testing.)For uncomplicated UTI caused by E. coli,K. pneumoniae or P. mirabilis: Cefazolin issusceptible if MIC <32 mcg/mL and predictssusceptible to the oral agents cefaclor, cefdinir,cefpodoxime, cefprozil, cefuroxime, cephalexinand loracarbef.    CEFEPIME <=1 Sensitive     CEFTRIAXONE <=1 Sensitive     CIPROFLOXACIN <=0.25 Sensitive     LEVOFLOXACIN <=0.12 Sensitive     ERTAPENEM <=0.5 Sensitive     GENTAMICIN <=1 Sensitive     IMIPENEM <=0.25 Sensitive     NITROFURANTOIN <=16 Sensitive     PIP/TAZO <=4 Sensitive     TOBRAMYCIN <=1 Sensitive     TRIMETH/SULFA <=20 Sensitive    Klebsiella pneumoniae - URINE CULTURE NEGATIVE 2    AMOX/CLAVULANIC <=2 Sensitive     AMPICILLIN  Resistant     AMPICILLIN/SULBACTAM 4 Sensitive     CEFAZOLIN <=4 Not Reportable     CEFEPIME <=1 Sensitive     CEFTRIAXONE <=1 Sensitive     CIPROFLOXACIN <=0.25 Sensitive     LEVOFLOXACIN <=0.12 Sensitive     ERTAPENEM <=0.5 Sensitive     GENTAMICIN <=1 Sensitive     IMIPENEM <=0.25 Sensitive     NITROFURANTOIN 64 Intermediate     PIP/TAZO <=4 Sensitive     TOBRAMYCIN <=1  Sensitive     TRIMETH/SULFA* <=20 Sensitive      * For infections other than uncomplicated UTIcaused by E. coli, K. pneumoniae or P. mirabilis:Cefazolin is resistant if MIC > or = 8 mcg/mL.(Distinguishing susceptible versus intermediatefor isolates with MIC < or = 4 mcg/mL requiresadditional testing.)For uncomplicated UTI caused by E. coli,K. pneumoniae or P. mirabilis: Cefazolin issusceptible if MIC <32 mcg/mL and predictssusceptible to the oral agents cefaclor, cefdinir,cefpodoxime, cefprozil, cefuroxime, cephalexinand loracarbef.Legend:S = Susceptible  I = IntermediateR = Resistant  NS = Not susceptible* = Not tested  NR = Not reported**NN = See antimicrobic comments    Coagulation Studies: No results for input(s): LABPROT, INR in the last 72 hours.  Urinalysis:  Recent Labs  01/11/17 1418  COLORURINE STRAW*  LABSPEC 1.009  PHURINE 6.0  GLUCOSEU NEGATIVE  HGBUR NEGATIVE  BILIRUBINUR NEGATIVE  KETONESUR 20*  PROTEINUR NEGATIVE  NITRITE NEGATIVE  LEUKOCYTESUR NEGATIVE      Imaging: No results found.   Medications:   . sodium chloride Stopped (01/14/17 1053)   . ciprofloxacin  250 mg Oral BID  . enalapril  10 mg Oral Daily  . enoxaparin (LOVENOX) injection  40 mg Subcutaneous Q24H  . feeding supplement  1 Container Oral TID BM  . metoprolol tartrate  12.5 mg Oral BID  . traZODone  100 mg Oral QHS   acetaminophen, bisacodyl, hydrALAZINE, HYDROcodone-acetaminophen, morphine injection, ondansetron **OR** ondansetron (ZOFRAN) IV, senna-docusate  Assessment/ Plan:  83 y.o. female  with hypertension, was admitted on 01/11/2017 with severe nausea and vomiting after taking sulfa antibiotics for a UTI.   1.  Hyponatremia Likely secondary to volume depletion from severe nausea and vomiting Sodium level has improved with IV normal saline, although slow correction  May discontinue hydrochlorothiazide for use as outpatient Patient expected to be discharged today  2.   UTI Escherichia coli and Klebsiella Sensitive to Cipro Patient has tolerated that before  3.  Hypokalemia Expected to improve  with current treatment and improved oral intake     LOS: 3 SINGH,HARMEET 10/4/201811:21 [place], [place]

## 2017-01-14 NOTE — Care Management Important Message (Signed)
Important Message  Patient Details  Name: [name] MRN: [id number] Date of Birth: Jan 13, 1925   Medicare Important Message Given:  Yes    Shelbie Ammons, RN 01/14/2017, 8:45 AM

## 2017-01-14 NOTE — Progress Notes (Signed)
MD order received to discharge pt home with home health today; see Care Management note regarding home health; verbally reviewed AVS with pt, no questions voiced at this time; pt discharged via wheelchair by nursing to the visitor's entrance

## 2017-01-15 ENCOUNTER — Telehealth: Payer: Self-pay

## 2017-01-15 NOTE — Telephone Encounter (Signed)
Transition Care Management Follow-Up Telephone Call   Date discharged and where: [facility] on 01/14/17  How have you been since you were released from the hospital? Pt states she's doing about the same, still sore from the fall. No new s/s. Pt has increased her water consumption.  Any patient concerns? None  Items Reviewed:   Meds: Yes  Allergies: Yes  Dietary Changes Reviewed: Yes, increase water consumption.  Functional Questionnaire:  Independent-I Dependent-D  ADLs:   Dressing- I    Eating- I   Maintaining continence- I   Transferring- I   Transportation- I   Meal Prep- I   Managing Meds- I  Confirmed importance and Date/Time of follow-up visits scheduled: 01/21/17 @ 11:30 AM   Confirmed with patient if condition worsens to call PCP or go to the Emergency Dept. Patient was given office number and encouraged to call back with questions or concerns: YES

## 2017-01-16 ENCOUNTER — Telehealth: Payer: Self-pay | Admitting: Physician Assistant

## 2017-01-16 NOTE — Telephone Encounter (Signed)
Front desk received incoming call from granddaughter [name] who was calling with concerns about her grandmother's hospitalization for UTI following a visit with myself. The grand daughter spoke to [place] about her grandmothers's situation. There was some concern as to why she was placed on Bactrim when she had tolerated ciprofloxacin in the past. Unfortunately, the granddaughter is not on the DPR or the emergency contacts, so no information could be released to her. However, should the patient herself call back with concerns, I prescribed Bactrim for cystitis on 01/07/2017 based on prior culture sensitivities. Pathogens were indeed sensitive to Bactrim. Deferred cipro based on age and risk of tendon rupture with extended treatment. Encouraged her to contact clinic or be seen for any problems. Patient apparently experienced nausea/vomiting, was unable to tolerate medication, and stopped taking after two days. No call placed to clinic for further advice. Patient presented to ER on 01/11/2017 with dehydration, hyponatremia, and persistent cystitis requiring hospitalization. Unfortunately, I cannot predict reactions to different medications and would encourage this patient to please contact our clinic if a similar situation arises in the future.   Patient herself was reached on subsequent phone call. She only wanted to ask about her trazadone, which had been discontinued on hospital discharge. She was taking 50 mg nightly for several years and since discharge has been unable to sleep. Has tolerated drug well in the past with no ill effects. Directed patient to start with 25 mg nightly and can increase to 50 mg nightly after three days. Please call back if any issues arise.

## 2017-01-19 NOTE — Discharge Summary (Signed)
[name], is a 83 y.o. female  DOB 1924-05-04  MRN [id number].  Admission date:  01/11/2017  Admitting Physician  Bettey Costa, MD  Discharge Date:  01/19/2017   Primary MD  Chrismon, Vickki Muff, PA  Recommendations for primary care physician for things to follow:   Follow with PCP in one week Follow with nephrology in  One week  Admission Diagnosis  Dehydration [E86.0] Hyponatremia [E87.1] Acute cystitis without hematuria [N30.00]   Discharge Diagnosis  Dehydration [E86.0] Hyponatremia [E87.1] Acute cystitis without hematuria [N30.00]    Active Problems:   Hyponatremia      Past Medical History:  Diagnosis Date  . Breast cyst    removed  . Diverticulitis   . Gallstones   . Hypertension   . Low sodium levels     Past Surgical History:  Procedure Laterality Date  . ABDOMINAL HYSTERECTOMY  2000  . APPENDECTOMY  1940's  . bladder tack  1990's  . BREAST CYST INCISION AND DRAINAGE Left 1990's  . CHOLECYSTECTOMY  09/11/13   Cholangiogram suggested a retained stone ductal dilatation.  Marland Kitchen ERCP W/ SPHICTEROTOMY  09/12/2013   Recurrent stone identified post cholecystectomy.  Marland Kitchen NASAL SINUS SURGERY         History of present illness and  Hospital Course:     Kindly see H&P for history of present illness and admission details, please review complete Labs, Consult reports and Test reports for all details in brief  HPI  from the history and physical done on the day of admission 83 yr old female admitted for for nausea,vomiting,dehydration with hyponatremia.Her symptoms started after she was given bactrim for UTI.sodium was 116 on admission.   Hospital Course   1.Hypovolemic hyponatremia;had nausea,vomiting.her symptoms started after she was given bactrim for UTI by PMD.admitted,started on IV fluids.Sodium  improved from 116 to 127.  2.nausea,Vomiting due to bactrim;started on IV zofran,and nausea,vomiting improved after treating sodium. 3.UTI;started on Cipro fro UTI here.discharged home with cipro,urine cultures done at pmd office showed klebsiella and ecoli.,S to cipro 4.Essential htn;continue metoprolol and enalapril. Discharged home with home PT.Marland Kitchen  Discharge Condition: stable   Follow UP  Follow-up Information    Chrismon, Vickki Muff, PA On 01/21/2017.   Specialty:  Family Medicine Why:  Please keep your follow up appointment for Thursday 10/11 @ 11:30am Contact information: [place] [id number] [phone]        Murlean Iba, MD On 01/22/2017.   Specialty:  Internal Medicine Why:  Please keep your follow up appointment for Friday 01/22/17 @ 2:30pm Contact information: [place] [address] [phone]             Discharge Instructions  and  Discharge Medications     Discharge Instructions    Face-to-face encounter (required for Medicare/Medicaid patients)    Complete by:  As directed    I KONIDENA,SNEHALATHA certify that this patient is under my care and that I, or a nurse practitioner or physician's assistant working with me, had a face-to-face encounter that meets the physician face-to-face encounter requirements with this patient on 01/14/2017. The encounter with the patient was in whole, or in part for the following medical condition(s) which is the primary reason for home health care UTI HTN deconditioning   The encounter with the patient was in whole, or in part, for the following medical condition, which is the primary reason for home health care:  whole   I certify that, based on  my findings, the following services are medically necessary home health services:  Physical therapy   Reason for Medically Necessary Home Health Services:  Therapy- Personnel officer, Public librarian   My  clinical findings support the need for the above services:  Unable to leave home safely without assistance and/or assistive device   Further, I certify that my clinical findings support that this patient is homebound due to:  Ambulates short distances less than 300 feet   Home Health    Complete by:  As directed    To provide the following care/treatments:   PT [place]       Allergies as of 01/14/2017      Reactions   Accolate  [zafirlukast]    Bactrim [sulfamethoxazole-trimethoprim] Nausea And Vomiting   Metronidazole    Nitrofurantoin Monohyd Macro Other (See Comments)   GI upset   Penicillins Diarrhea   Keflex [cephalexin] Rash      Medication List    STOP taking these medications   hydrochlorothiazide 25 MG tablet Commonly known as:  HYDRODIURIL   sulfamethoxazole-trimethoprim 800-160 MG tablet Commonly known as:  BACTRIM DS   traZODone 100 MG tablet Commonly known as:  DESYREL     TAKE these medications   acetaminophen 325 MG tablet Commonly known as:  TYLENOL Take 2 tablets (650 mg total) by mouth every 6 (six) hours as needed for mild pain (or Fever >/= 101).   ciprofloxacin 250 MG tablet Commonly known as:  CIPRO Take 1 tablet (250 mg total) by mouth 2 (two) times daily.   enalapril 10 MG tablet Commonly known as:  VASOTEC Take 1 tablet (10 mg total) by mouth daily. What changed:  See the new instructions.   feeding supplement Liqd Take 1 Container by mouth 3 (three) times daily between meals.   metoprolol tartrate 25 MG tablet Commonly known as:  LOPRESSOR Take 0.5 tablets (12.5 mg total) by mouth 2 (two) times daily.         Diet and Activity recommendation: See Discharge Instructions above   Consults obtained - nephrology   Major procedures and Radiology Reports - PLEASE review detailed and final reports for all details, in brief -   Results     No results found for this or any previous visit (from the past 240  hour(s)).     Today   Subjective:   [name] today has no headache,no chest abdominal pain,no new weakness tingling or numbness, feels much better wants to go home today.   Objective:   Blood pressure (!) 116/39, pulse (!) 56, temperature 98.4 F (36.9 C), temperature source Oral, resp. rate 14, height 5\' 5"  (1.651 m), weight 53.1 kg (117 lb), SpO2 96 %.  No intake or output data in the 24 hours ending 01/19/17 1205  Exam Awake Alert, Oriented x 3, No new F.N deficits, Normal affect [place].AT,PERRAL Supple Neck,No JVD, No cervical lymphadenopathy appriciated.  Symmetrical Chest wall movement, Good air movement bilaterally, CTAB RRR,No Gallops,Rubs or new Murmurs, No Parasternal Heave +ve B.Sounds, Abd Soft, Non tender, No organomegaly appriciated, No rebound -guarding or rigidity. No Cyanosis, Clubbing or edema, No new Rash or bruise  Data Review   CBC w Diff:  Lab Results  Component Value Date   WBC 6.4 01/12/2017   HGB 10.9 (L) 01/12/2017   HGB 12.8 07/14/2016   HCT 29.8 (L) 01/12/2017   HCT 37.2 07/14/2016   PLT 154 01/12/2017   PLT 190 07/14/2016  LYMPHOPCT 6.5 08/11/2013   MONOPCT 4.0 08/11/2013   EOSPCT 0.1 08/11/2013   BASOPCT 0.2 08/11/2013    CMP:  Lab Results  Component Value Date   NA 127 (L) 01/14/2017   NA 138 07/14/2016   NA 132 (L) 09/13/2013   K 3.5 01/14/2017   K 3.7 09/13/2013   CL 101 01/14/2017   CL 101 09/13/2013   CO2 22 01/14/2017   CO2 26 09/13/2013   BUN 20 01/14/2017   BUN 17 07/14/2016   BUN 12 09/13/2013   CREATININE 0.93 01/14/2017   CREATININE 0.83 09/13/2013   PROT 6.9 01/11/2017   PROT 7.2 07/14/2016   PROT 5.7 (L) 09/13/2013   ALBUMIN 4.1 01/11/2017   ALBUMIN 5.0 (H) 07/14/2016   ALBUMIN 2.8 (L) 09/13/2013   BILITOT 1.1 01/11/2017   BILITOT 0.3 07/14/2016   BILITOT 0.7 09/13/2013   ALKPHOS 77 01/11/2017   ALKPHOS 203 (H) 09/13/2013   AST 68 (H) 01/11/2017   AST 384 (H) 09/13/2013   ALT 22 01/11/2017   ALT 396  (H) 09/13/2013  .   Total Time in preparing paper work, data evaluation and todays exam - 76 minutes  KONIDENA,SNEHALATHA M.D on 01/14/2017 at 12:05 PM    Note: This dictation was prepared with Dragon dictation along with smaller phrase technology. Any transcriptional errors that result from this process are unintentional.

## 2017-01-20 ENCOUNTER — Telehealth: Payer: Self-pay

## 2017-01-20 ENCOUNTER — Other Ambulatory Visit: Payer: Self-pay

## 2017-01-20 NOTE — Telephone Encounter (Signed)
Patient calls the office after speaking with EMMI. She states she was told to contact our office about hospital follow up appointment scheduled for 01/21/17. Patient states she was constipated while she was in the hospital and when she was discharged she took a laxative. Her bowels loosened yesterday, and she is afraid she want be able to make her appointment. She denies any other GI symptoms. Advised patient to see how she is feeling in the morning, and call if unable to make it tomorrow. Encouraged patient to keep appointment and if unable, she needs to be sure to reschedule as the appointment was important. She verbalized understanding.

## 2017-01-20 NOTE — Patient Outreach (Signed)
[facility]) Care Management  01/20/2017  [name] 1924-04-19 [id number]     EMMI-General Discharge RED ON EMMI ALERT Day # 4 Date: 01/19/17 Red Alert Reason: " Know who to call about changes in condition? No    New prescriptions? I don't know   Lost interest in things? Yes  Sad/hopeless/anxious/empty? Yes"   Outreach attempt # 1 to patient. Spoke with patient. Reviewed and addressed red alerts. Patient states he has not been feeling well since return home. She states she started having diarrhea on yesterday. She is unsure if it is better today. Patient poor historian and very vague when describing stuff. She voices that she has no taken anything for symptoms. She denies any recent abd pain, n/v or other GI symptoms. RN CM instructed patient on s/s of worsening condition, when to seek medical attention and ways to help treat diarrhea along with possible causes. She voices that she goes for PCP f/u appt tomorrow but is considering cancelling appt due to her not feeling well. RN CM strongly encouraged patient to not cancel appt and the importance of her not feeling well needs to be further assessed by MD. RN CM encouraged patient to contact MD office to discuss what she should take and if she should be seen sooner. Patient provided with PCP contact info and states she will call office today to advise them of her symptoms. Patient voices no further RN CM needs or concerns at this time.        Plan: RN CM will notify [facility] administrative assistant of case status.    Enzo Montgomery, RN,BSN,CCM [place] Management Telephonic Care Management Coordinator Direct Phone: [phone] Toll Free: [phone] Fax: [phone]

## 2017-01-21 ENCOUNTER — Encounter: Payer: Self-pay | Admitting: Family Medicine

## 2017-01-21 ENCOUNTER — Ambulatory Visit (INDEPENDENT_AMBULATORY_CARE_PROVIDER_SITE_OTHER): Payer: PPO | Admitting: Family Medicine

## 2017-01-21 VITALS — BP 122/70 | HR 57 | Temp 98.0°F

## 2017-01-21 DIAGNOSIS — E871 Hypo-osmolality and hyponatremia: Secondary | ICD-10-CM

## 2017-01-21 DIAGNOSIS — N390 Urinary tract infection, site not specified: Secondary | ICD-10-CM | POA: Diagnosis not present

## 2017-01-21 LAB — COMPLETE METABOLIC PANEL WITH GFR
AG Ratio: 1.7 (calc) (ref 1.0–2.5)
ALT: 13 U/L (ref 6–29)
AST: 25 U/L (ref 10–35)
Albumin: 3.8 g/dL (ref 3.6–5.1)
Alkaline phosphatase (APISO): 65 U/L (ref 33–130)
BUN: 10 mg/dL (ref 7–25)
CO2: 25 mmol/L (ref 20–32)
Calcium: 9.4 mg/dL (ref 8.6–10.4)
Chloride: 88 mmol/L — ABNORMAL LOW (ref 98–110)
Creat: 0.73 mg/dL (ref 0.60–0.88)
GFR, Est African American: 85 mL/min/{1.73_m2} (ref >=60)
GFR, Est Non African American: 73 mL/min/{1.73_m2} (ref >=60)
Globulin: 2.2 g/dL (calc) (ref 1.9–3.7)
Glucose, Bld: 101 mg/dL — ABNORMAL HIGH (ref 65–99)
Potassium: 4 mmol/L (ref 3.5–5.3)
Sodium: 123 mmol/L — ABNORMAL LOW (ref 135–146)
Total Bilirubin: 0.5 mg/dL (ref 0.2–1.2)
Total Protein: 6 g/dL — ABNORMAL LOW (ref 6.1–8.1)

## 2017-01-21 LAB — POCT URINALYSIS DIPSTICK
Bilirubin, UA: NEGATIVE
Blood, UA: NEGATIVE
Glucose, UA: NEGATIVE
Ketones, UA: NEGATIVE
Leukocytes, UA: NEGATIVE
Nitrite, UA: NEGATIVE
Protein, UA: NEGATIVE
Spec Grav, UA: 1.01 (ref 1.010–1.025)
Urobilinogen, UA: 0.2 E.U./dL
pH, UA: 7.5 (ref 5.0–8.0)

## 2017-01-21 LAB — CBC WITH DIFFERENTIAL/PLATELET
Basophils Absolute: 27 cells/uL (ref 0–200)
Basophils Relative: 0.4 %
Eosinophils Absolute: 61 cells/uL (ref 15–500)
Eosinophils Relative: 0.9 %
HCT: 29.8 % — ABNORMAL LOW (ref 35.0–45.0)
Hemoglobin: 10.5 g/dL — ABNORMAL LOW (ref 11.7–15.5)
Lymphs Abs: 1516 cells/uL (ref 850–3900)
MCH: 32.2 pg (ref 27.0–33.0)
MCHC: 35.2 g/dL (ref 32.0–36.0)
MCV: 91.4 fL (ref 80.0–100.0)
MPV: 9.5 fL (ref 7.5–12.5)
Monocytes Relative: 7.9 %
Neutro Abs: 4658 cells/uL (ref 1500–7800)
Neutrophils Relative %: 68.5 %
Platelets: 250 10*3/uL (ref 140–400)
RBC: 3.26 10*6/uL — ABNORMAL LOW (ref 3.80–5.10)
RDW: 12.1 % (ref 11.0–15.0)
Total Lymphocyte: 22.3 %
WBC mixed population: 537 cells/uL (ref 200–950)
WBC: 6.8 10*3/uL (ref 3.8–10.8)

## 2017-01-21 NOTE — Progress Notes (Signed)
[name]    DOB: 1924-11-13   83 y.o.   MRN: [id number] Visit Date: 01/21/2017  Today's Provider: Vernie Murders, PA   Chief Complaint  Patient presents with  . Hospitalization Follow-up   Subjective:    HPI  Follow up Hospitalization  Patient was admitted to [facility] on 01/11/17 and discharged on 01/14/17. She was treated for Hyponatremia. Treatment for this included started Cipro. Discontinued HCTZ, Trazodone and Bactrim. Telephone follow up was done on 01/15/17 She reports good compliance with treatment. She reports this condition is slightly improved.  ------------------------------------------------------------------------------------   Past Medical History:  Diagnosis Date  . Breast cyst    removed  . Diverticulitis   . Gallstones   . Hypertension   . Low sodium levels    Past Surgical History:  Procedure Laterality Date  . ABDOMINAL HYSTERECTOMY  2000  . APPENDECTOMY  1940's  . bladder tack  1990's  . BREAST CYST INCISION AND DRAINAGE Left 1990's  . CHOLECYSTECTOMY  09/11/13   Cholangiogram suggested a retained stone ductal dilatation.  Marland Kitchen ERCP W/ SPHICTEROTOMY  09/12/2013   Recurrent stone identified post cholecystectomy.  Marland Kitchen NASAL SINUS SURGERY     Family History  Problem Relation Age of Onset  . Hypertension Father   . CAD Father   . Cancer Brother        unknown type   Allergies  Allergen Reactions  . Accolate  [Zafirlukast]   . Bactrim [Sulfamethoxazole-Trimethoprim] Nausea And Vomiting  . Metronidazole   . Nitrofurantoin Monohyd Macro Other (See Comments)    GI upset  . Penicillins Diarrhea  . Keflex [Cephalexin] Rash     Previous Medications   ACETAMINOPHEN (TYLENOL) 325 MG TABLET    Take 2 tablets (650 mg total) by mouth every 6 (six) hours as needed for mild pain (or Fever >/= 101).   ENALAPRIL (VASOTEC) 10 MG TABLET    Take 1 tablet (10 mg total) by mouth daily.   FEEDING SUPPLEMENT (BOOST / RESOURCE BREEZE) LIQD     Take 1 Container by mouth 3 (three) times daily between meals.   METOPROLOL TARTRATE (LOPRESSOR) 25 MG TABLET    Take 0.5 tablets (12.5 mg total) by mouth 2 (two) times daily.    Review of Systems  Constitutional: Positive for fatigue.  Respiratory: Negative.   Cardiovascular: Negative.   Neurological: Positive for weakness.    Social History  Substance Use Topics  . Smoking status: Never Smoker  . Smokeless tobacco: Never Used  . Alcohol use No   Objective:   BP 122/70 (BP Location: Right Arm, Patient Position: Sitting, Cuff Size: Normal)   Pulse (!) 57   Temp 98 F (36.7 C) (Oral)   SpO2 99%   Physical Exam  Constitutional: She is oriented to person, place, and time.  Thin individual.  HENT:  Head: Normocephalic.  Eyes: Pupils are equal, round, and reactive to light. Conjunctivae are normal.  Neck: Neck supple.  Cardiovascular: Normal rate and regular rhythm.   Pulmonary/Chest: Effort normal and breath sounds normal.  Abdominal: Soft. Bowel sounds are normal.  Lymphadenopathy:    She has no cervical adenopathy.  Neurological: She is alert and oriented to person, place, and time.  Psychiatric: She has a normal mood and affect. Her behavior is normal.      Assessment & Plan:     1. Hyponatremia Hospitalized for dehydration and hyponatremia secondary to GI upset from Bactrim for a UTI (E.coli isolated on culture 01-07-17).  Discharged from the hospital on 01-14-17 and bowel habits returning to normal. Still feels fatigued and family notices she is a little confused. Encouraged to get a little more salt in diet and recheck CMP.  - Comprehensive metabolic panel  2. Urinary tract infection without hematuria, site unspecified Was switched to Cipro for UTI when she left the hospital. Will recheck urinalysis, CBC and CMP. Has finished the antibiotic. Denies dysuria. Side effect profile indicates Cipro could be causing some confusion and insomnia. This should abate as it leaves  her system.  - CBC with Differential/Platelet - Comprehensive metabolic panel

## 2017-01-21 NOTE — Telephone Encounter (Signed)
Acknowledged.

## 2017-01-21 NOTE — Addendum Note (Signed)
Addended by: Jules Schick on: 01/21/2017 12:53 PM   Modules accepted: Orders

## 2017-02-05 ENCOUNTER — Encounter: Payer: Self-pay | Admitting: Family Medicine

## 2017-02-05 ENCOUNTER — Ambulatory Visit (INDEPENDENT_AMBULATORY_CARE_PROVIDER_SITE_OTHER): Payer: PPO | Admitting: Family Medicine

## 2017-02-05 VITALS — BP 112/62 | HR 55 | Temp 97.5°F

## 2017-02-05 DIAGNOSIS — R5383 Other fatigue: Secondary | ICD-10-CM

## 2017-02-05 DIAGNOSIS — E871 Hypo-osmolality and hyponatremia: Secondary | ICD-10-CM

## 2017-02-05 DIAGNOSIS — N390 Urinary tract infection, site not specified: Secondary | ICD-10-CM

## 2017-02-05 DIAGNOSIS — D649 Anemia, unspecified: Secondary | ICD-10-CM | POA: Diagnosis not present

## 2017-02-05 LAB — POCT URINALYSIS DIPSTICK
Bilirubin, UA: NEGATIVE
Blood, UA: NEGATIVE
Glucose, UA: NEGATIVE
Ketones, UA: NEGATIVE
Nitrite, UA: POSITIVE
Spec Grav, UA: 1.02 (ref 1.010–1.025)
Urobilinogen, UA: 0.2 E.U./dL
pH, UA: 7.5 (ref 5.0–8.0)

## 2017-02-05 MED ORDER — LEVOFLOXACIN 500 MG PO TABS: 500.0000 mg | ORAL_TABLET | Freq: Every day | ORAL | 0 refills | Status: DC

## 2017-02-05 NOTE — Progress Notes (Signed)
[name]    DOB: [birth year]   83 y.o.   MRN: [id number] Visit Date: 02/05/2017  Today's Provider: Vernie Murders, PA   Chief Complaint  Patient presents with  . Recheck Labs  . Weakness   Subjective:    HPI Patient presents today to have CMP rechecked. Last labs done on 01/21/2017 showed low sodium levels. Patient advised to increase salt intake and have labs rechecked. Patient complains of weakness. She states she is so weak that she has to continue using a roller walker and is unable to prepare her own meals. Symptoms started after hospital stay on 01/11/2017.  Past Medical History:  Diagnosis Date  . Breast cyst    removed  . Diverticulitis   . Gallstones   . Hypertension   . Low sodium levels    Patient Active Problem List   Diagnosis Date Noted  . Hyponatremia 01/11/2017  . Arthritis of both hands 01/14/2016  . Urinary tract infection 08/21/2015  . Acute UTI 08/21/2015  . Scalp laceration 07/29/2015  . Fall 07/29/2015  . Near syncope 07/21/2015  . Foot pain 12/31/2014  . Anxiety 10/17/2014  . Allergic rhinitis 10/16/2014  . Airway hyperreactivity 10/16/2014  . Biliary calculi, common bile duct 10/16/2014  . Clinical depression 10/16/2014  . 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 Clicking shoulder 10/62/6948  . Essential (primary) hypertension 10/16/2014  . H/O gastrointestinal hemorrhage 10/16/2014  . 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 Below normal amount of sodium in the blood 10/16/2014  . Insomnia 10/16/2014  . Bad memory 10/16/2014  . OP (osteoporosis) 10/16/2014  . Cyst of pancreas 10/16/2014  . Temporary cerebral vascular dysfunction 10/16/2014  . Other constipation 03/07/2014  . Right groin pain 03/07/2014  . Internal hemorrhoid 03/07/2014  . Gallstones, common bile duct 08/29/2013  . Cholelithiasis 08/29/2013  . CYST AND PSEUDOCYST OF PANCREAS 03/12/2008   Past Surgical History:  Procedure Laterality Date  . ABDOMINAL  HYSTERECTOMY  2000  . APPENDECTOMY  1940's  . bladder tack  1990's  . BREAST CYST INCISION AND DRAINAGE Left 1990's  . CHOLECYSTECTOMY  09/11/13   Cholangiogram suggested a retained stone ductal dilatation.  Marland Kitchen ERCP W/ SPHICTEROTOMY  09/12/2013   Recurrent stone identified post cholecystectomy.  Marland Kitchen NASAL SINUS SURGERY     Family History  Problem Relation Age of Onset  . Hypertension Father   . CAD Father   . Cancer Brother        unknown type   Allergies  Allergen Reactions  . Accolate  [Zafirlukast]   . Bactrim [Sulfamethoxazole-Trimethoprim] Nausea And Vomiting  . Metronidazole   . Nitrofurantoin Monohyd Macro Other (See Comments)    GI upset  . Penicillins Diarrhea  . Keflex [Cephalexin] Rash    Previous Medications   ACETAMINOPHEN (TYLENOL) 325 MG TABLET    Take 2 tablets (650 mg total) by mouth every 6 (six) hours as needed for mild pain (or Fever >/= 101).   ENALAPRIL (VASOTEC) 10 MG TABLET    Take 1 tablet (10 mg total) by mouth daily.   FEEDING SUPPLEMENT (BOOST / RESOURCE BREEZE) LIQD    Take 1 Container by mouth 3 (three) times daily between meals.   METOPROLOL TARTRATE (LOPRESSOR) 25 MG TABLET    Take 0.5 tablets (12.5 mg total) by mouth 2 (two) times daily.    Review of Systems  Respiratory: Negative.   Cardiovascular: Negative.   Neurological: Positive for weakness.    Social History  Substance Use  Topics  . Smoking status: Never Smoker  . Smokeless tobacco: Never Used  . Alcohol use No   Objective:   BP 112/62 (BP Location: Right Arm, Patient Position: Sitting, Cuff Size: Normal)   Pulse (!) 55   Temp (!) 97.5 F (36.4 C) (Oral)   SpO2 98%   Physical Exam  Constitutional: She is oriented to person, place, and time. She appears well-developed and well-nourished. No distress.  HENT:  Head: Normocephalic and atraumatic.  Right Ear: Hearing normal.  Left Ear: Hearing normal.  Nose: Nose normal.  Eyes: Conjunctivae and lids are normal. Right eye  exhibits no discharge. Left eye exhibits no discharge. No scleral icterus.  Neck: Neck supple.  Cardiovascular: Normal rate and regular rhythm.   Pulmonary/Chest: Effort normal and breath sounds normal. No respiratory distress.  Abdominal: Soft. Bowel sounds are normal.  Musculoskeletal: Normal range of motion.  Neurological: She is alert and oriented to person, place, and time.  Skin: Skin is intact. No lesion and no rash noted.  Psychiatric: She has a normal mood and affect. Her speech is normal and behavior is normal. Thought content normal.      Assessment & Plan:     1. Hyponatremia Sodium was down to 119 on 01-11-17 and back up to 123 on 01-21-17. Trying to limit water intake to no more than 32 ounces a day and adding a little salt back into diet. Will recheck osmolality and CMP to assess progress. Continue present regimen. - Comprehensive metabolic panel - Osmolality  2. Fatigue, unspecified type Suspect secondary to recent hospitalization for dehydration and evidence of anemia. Recheck labs to assess progress. - CBC with Differential/Platelet - Comprehensive metabolic panel - Osmolality - Ferritin  3. Low hemoglobin Hgb dropped from 12.6 to 10.5  From 01-11-17 to 01-21-17. Will recheck CBC and ferritin level. Suspect some of this was due to dehydration secondary to GI up set from sulfa drug used for UTI caused by E.coli isolated on culture of 01-07-17. - CBC with Differential/Platelet - Ferritin  4. Urinary tract infection without hematuria, site unspecified Still has 2+ leukocytes and positive nitrites on urinalysis today. Will give Levaquin and check CBC with diff with urine C&S. - POCT urinalysis dipstick - CBC with Differential/Platelet - levofloxacin (LEVAQUIN) 500 MG tablet; Take 1 tablet (500 mg total) by mouth daily.  Dispense: 7 tablet; Refill: 0 - Urine Culture

## 2017-02-06 LAB — CBC WITH DIFFERENTIAL/PLATELET
Basophils Absolute: 28 cells/uL (ref 0–200)
Basophils Relative: 0.5 %
Eosinophils Absolute: 50 cells/uL (ref 15–500)
Eosinophils Relative: 0.9 %
HCT: 33.7 % — ABNORMAL LOW (ref 35.0–45.0)
Hemoglobin: 11.8 g/dL (ref 11.7–15.5)
Lymphs Abs: 1551 cells/uL (ref 850–3900)
MCH: 31.6 pg (ref 27.0–33.0)
MCHC: 35 g/dL (ref 32.0–36.0)
MCV: 90.1 fL (ref 80.0–100.0)
MPV: 10.2 fL (ref 7.5–12.5)
Monocytes Relative: 7.8 %
Neutro Abs: 3443 cells/uL (ref 1500–7800)
Neutrophils Relative %: 62.6 %
Platelets: 225 10*3/uL (ref 140–400)
RBC: 3.74 10*6/uL — ABNORMAL LOW (ref 3.80–5.10)
RDW: 12 % (ref 11.0–15.0)
Total Lymphocyte: 28.2 %
WBC mixed population: 429 cells/uL (ref 200–950)
WBC: 5.5 10*3/uL (ref 3.8–10.8)

## 2017-02-06 LAB — COMPLETE METABOLIC PANEL WITH GFR
AG Ratio: 1.8 (calc) (ref 1.0–2.5)
ALT: 10 U/L (ref 6–29)
AST: 22 U/L (ref 10–35)
Albumin: 4.6 g/dL (ref 3.6–5.1)
Alkaline phosphatase (APISO): 93 U/L (ref 33–130)
BUN: 13 mg/dL (ref 7–25)
CO2: 26 mmol/L (ref 20–32)
Calcium: 10.1 mg/dL (ref 8.6–10.4)
Chloride: 90 mmol/L — ABNORMAL LOW (ref 98–110)
Creat: 0.84 mg/dL (ref 0.60–0.88)
GFR, Est African American: 71 mL/min/{1.73_m2} (ref >=60)
GFR, Est Non African American: 61 mL/min/{1.73_m2} (ref >=60)
Globulin: 2.6 g/dL (calc) (ref 1.9–3.7)
Glucose, Bld: 102 mg/dL — ABNORMAL HIGH (ref 65–99)
Potassium: 4 mmol/L (ref 3.5–5.3)
Sodium: 126 mmol/L — ABNORMAL LOW (ref 135–146)
Total Bilirubin: 0.5 mg/dL (ref 0.2–1.2)
Total Protein: 7.2 g/dL (ref 6.1–8.1)

## 2017-02-06 LAB — URINE CULTURE
MICRO NUMBER:: 81203852
Result:: NO GROWTH
SPECIMEN QUALITY:: ADEQUATE

## 2017-02-06 LAB — OSMOLALITY: Osmolality: 262 mOsm/kg — ABNORMAL LOW (ref 278–305)

## 2017-02-06 LAB — FERRITIN: Ferritin: 501 ng/mL — ABNORMAL HIGH (ref 20–288)

## 2017-02-08 ENCOUNTER — Telehealth: Payer: Self-pay

## 2017-02-08 NOTE — Telephone Encounter (Signed)
Patient and patient's son advised. 4 week follow up appointment scheduled.

## 2017-02-08 NOTE — Telephone Encounter (Signed)
-----   Message from Margo Common, [place] sent at 02/08/2017  8:27 AM EDT ----- No bacterial growth on urine culture. No sign of infection on blood counts. Hemoglobin is back to normal and ferritin level is high(no need for extra iron right now). Sodium/salt level is still low and probably the reason for most of her symptoms. Limit water intake to no more than 32 ounces a day, add more salt into diet (even the chips, crackers and salted peanuts). Recheck level in 3-4 weeks.

## 2017-02-08 NOTE — Telephone Encounter (Signed)
LMTCB

## 2017-02-12 DIAGNOSIS — E871 Hypo-osmolality and hyponatremia: Secondary | ICD-10-CM | POA: Diagnosis not present

## 2017-02-12 DIAGNOSIS — I10 Essential (primary) hypertension: Secondary | ICD-10-CM | POA: Diagnosis not present

## 2017-02-12 DIAGNOSIS — R600 Localized edema: Secondary | ICD-10-CM | POA: Diagnosis not present

## 2017-02-18 DIAGNOSIS — I10 Essential (primary) hypertension: Secondary | ICD-10-CM | POA: Diagnosis not present

## 2017-02-18 DIAGNOSIS — R600 Localized edema: Secondary | ICD-10-CM | POA: Diagnosis not present

## 2017-02-18 DIAGNOSIS — E871 Hypo-osmolality and hyponatremia: Secondary | ICD-10-CM | POA: Diagnosis not present

## 2017-02-18 LAB — BASIC METABOLIC PANEL
BUN: 14 (ref 4–21)
Creatinine: 0.8 (ref 0.5–1.1)
Glucose: 98
Potassium: 3.5 (ref 3.4–5.3)
Sodium: 135 — AB (ref 137–147)

## 2017-03-09 ENCOUNTER — Ambulatory Visit: Payer: PPO | Admitting: Family Medicine

## 2017-03-09 ENCOUNTER — Encounter: Payer: Self-pay | Admitting: Family Medicine

## 2017-03-09 VITALS — BP 142/82 | HR 59 | Temp 97.6°F | Wt 117.6 lb

## 2017-03-09 DIAGNOSIS — N390 Urinary tract infection, site not specified: Secondary | ICD-10-CM | POA: Diagnosis not present

## 2017-03-09 DIAGNOSIS — E871 Hypo-osmolality and hyponatremia: Secondary | ICD-10-CM | POA: Diagnosis not present

## 2017-03-09 LAB — POCT URINALYSIS DIPSTICK
Bilirubin, UA: NEGATIVE
Blood, UA: NEGATIVE
Glucose, UA: NEGATIVE
Ketones, UA: NEGATIVE
Leukocytes, UA: NEGATIVE
Nitrite, UA: NEGATIVE
Protein, UA: NEGATIVE
Spec Grav, UA: 1.01 (ref 1.010–1.025)
Urobilinogen, UA: 0.2 E.U./dL
pH, UA: 7 (ref 5.0–8.0)

## 2017-03-09 NOTE — Progress Notes (Signed)
     [name]    DOB: 1924/11/30   83 y.o.   MRN: [id number] Visit Date: 03/09/2017  Today's Provider: Vernie Murders, PA   Chief Complaint  Patient presents with  . Hyponatremia follow up   Subjective:    HPI  Hyponatremia:  Patient presents today for a 4 week follow up. Last OV was on 02/05/17. Labs showed sodium/salt level is still low. Patient advised to limit water intake to no more than 32 ounces a day and add more salt into diet. Patient reports good compliance with treatment plan. Symptoms of fatigue has improved.     Allergies  Allergen Reactions  . Accolate  [Zafirlukast]   . Bactrim [Sulfamethoxazole-Trimethoprim] Nausea And Vomiting  . Metronidazole   . Nitrofurantoin Monohyd Macro Other (See Comments)    GI upset  . Penicillins Diarrhea  . Keflex [Cephalexin] Rash     Current Outpatient Medications:  .  acetaminophen (TYLENOL) 325 MG tablet, Take 2 tablets (650 mg total) by mouth every 6 (six) hours as needed for mild pain (or Fever >/= 101)., Disp: , Rfl:  .  enalapril (VASOTEC) 10 MG tablet, Take 1 tablet (10 mg total) by mouth daily., Disp: 30 tablet, Rfl: 0 .  feeding supplement (BOOST / RESOURCE BREEZE) LIQD, Take 1 Container by mouth 3 (three) times daily between meals., Disp: 30 Container, Rfl: 0 .  metoprolol tartrate (LOPRESSOR) 25 MG tablet, Take 0.5 tablets (12.5 mg total) by mouth 2 (two) times daily., Disp: 180 tablet, Rfl: 1 .  traZODone (DESYREL) 100 MG tablet, Take 50 mg by mouth at bedtime., Disp: , Rfl:   Review of Systems  Constitutional: Negative.   Respiratory: Negative.   Cardiovascular: Negative.     Social History   Tobacco Use  . Smoking status: Never Smoker  . Smokeless tobacco: Never Used  Substance Use Topics  . Alcohol use: No   Objective:   BP (!) 142/82 (BP Location: Right Arm, Patient Position: Sitting, Cuff Size: Normal)   Pulse (!) 59   Temp 97.6 F (36.4 C) (Oral)   Wt 117 lb 9.6 oz (53.3 kg)    SpO2 98%   BMI 19.57 kg/m  Wt Readings from Last 3 Encounters:  03/09/17 117 lb 9.6 oz (53.3 kg)  01/11/17 117 lb (53.1 kg)  01/07/17 119 lb (54 kg)   Physical Exam  Constitutional: She is oriented to person, place, and time. She appears well-developed and well-nourished.  HENT:  Head: Normocephalic.  Eyes: Conjunctivae are normal.  Cardiovascular: Normal rate and regular rhythm.  Pulmonary/Chest: Effort normal and breath sounds normal.  Abdominal: Soft.  Neurological: She is alert and oriented to person, place, and time.      Assessment & Plan:     1. Hyponatremia Strength is slowly returning. Was evaluated by Dr. Candiss Norse (nephrologist) on 02-18-17 and sodium was back up to 135. He will recheck her in 2 weeks. Should go back to less salt in diet. No edema today. BP better and weight stable. Recheck prn.  2. Urinary tract infection without hematuria, site unspecified Urinalysis clear today. No sign of infection. Continue Cranberry juice and fluid intake. Recheck as needed. No dysuria recently. - POCT urinalysis dipstick       Vernie Murders, PA  [facility]

## 2017-03-25 DIAGNOSIS — R600 Localized edema: Secondary | ICD-10-CM | POA: Diagnosis not present

## 2017-03-25 DIAGNOSIS — E871 Hypo-osmolality and hyponatremia: Secondary | ICD-10-CM | POA: Diagnosis not present

## 2017-03-25 DIAGNOSIS — I10 Essential (primary) hypertension: Secondary | ICD-10-CM | POA: Diagnosis not present

## 2017-04-12 ENCOUNTER — Other Ambulatory Visit: Payer: Self-pay | Admitting: Family Medicine

## 2017-04-12 DIAGNOSIS — G4700 Insomnia, unspecified: Secondary | ICD-10-CM

## 2017-04-14 NOTE — Telephone Encounter (Signed)
[name] patient- Patient is requesting a refill on Trazodone. She needs the RX today. Can you approve this? [phone]# [phone]

## 2017-07-06 ENCOUNTER — Ambulatory Visit (INDEPENDENT_AMBULATORY_CARE_PROVIDER_SITE_OTHER): Payer: PPO

## 2017-07-06 VITALS — BP 128/78 | HR 64 | Temp 97.6°F | Ht 65.0 in | Wt 124.4 lb

## 2017-07-06 DIAGNOSIS — Z0000 Encounter for general adult medical examination without abnormal findings: Secondary | ICD-10-CM

## 2017-07-06 NOTE — Patient Instructions (Signed)
[name] , Thank you for taking time to come for your Medicare Wellness Visit. I appreciate your ongoing commitment to your health goals. Please review the following plan we discussed and let me know if I can assist you in the future.   Screening recommendations/referrals: Colonoscopy: Up to date Mammogram: Up to date Bone Density: Up to date Recommended yearly ophthalmology/optometry visit for glaucoma screening and checkup Recommended yearly dental visit for hygiene and checkup  Vaccinations: Influenza vaccine: Up to date Pneumococcal vaccine: Up to date Tdap vaccine: Pt declines today.  Shingles vaccine: Pt declines today.     Advanced directives: Please bring a copy of your POA (Power of Attorney) and/or Living Will to your next appointment.   Conditions/risks identified: Recommend increasing water intake to 4-6 glasses a day.   Next appointment: 07/20/08 @ 10:00 AM with Simona Huh Chrismon.   Preventive Care 23 Years and Older, Female Preventive care refers to lifestyle choices and visits with your health care provider that can promote health and wellness. What does preventive care include?  A yearly physical exam. This is also called an annual well check.  Dental exams once or twice a year.  Routine eye exams. Ask your health care provider how often you should have your eyes checked.  Personal lifestyle choices, including:  Daily care of your teeth and gums.  Regular physical activity.  Eating a healthy diet.  Avoiding tobacco and drug use.  Limiting alcohol use.  Practicing safe sex.  Taking low-dose aspirin every day.  Taking vitamin and mineral supplements as recommended by your health care provider. What happens during an annual well check? The services and screenings done by your health care provider during your annual well check will depend on your age, overall health, lifestyle risk factors, and family history of disease. Counseling  Your health care  provider may ask you questions about your:  Alcohol use.  Tobacco use.  Drug use.  Emotional well-being.  Home and relationship well-being.  Sexual activity.  Eating habits.  History of falls.  Memory and ability to understand (cognition).  Work and work Statistician.  Reproductive health. Screening  You may have the following tests or measurements:  Height, weight, and BMI.  Blood pressure.  Lipid and cholesterol levels. These may be checked every 5 years, or more frequently if you are over 53 years old.  Skin check.  Lung cancer screening. You may have this screening every year starting at age 33 if you have a 30-pack-year history of smoking and currently smoke or have quit within the past 15 years.  Fecal occult blood test (FOBT) of the stool. You may have this test every year starting at age 58.  Flexible sigmoidoscopy or colonoscopy. You may have a sigmoidoscopy every 5 years or a colonoscopy every 10 years starting at age 20.  Hepatitis C blood test.  Hepatitis B blood test.  Sexually transmitted disease (STD) testing.  Diabetes screening. This is done by checking your blood sugar (glucose) after you have not eaten for a while (fasting). You may have this done every 1-3 years.  Bone density scan. This is done to screen for osteoporosis. You may have this done starting at age 78.  Mammogram. This may be done every 1-2 years. Talk to your health care provider about how often you should have regular mammograms. Talk with your health care provider about your test results, treatment options, and if necessary, the need for more tests. Vaccines  Your health care provider may  recommend certain vaccines, such as:  Influenza vaccine. This is recommended every year.  Tetanus, diphtheria, and acellular pertussis (Tdap, Td) vaccine. You may need a Td booster every 10 years.  Zoster vaccine. You may need this after age 38.  Pneumococcal 13-valent conjugate (PCV13)  vaccine. One dose is recommended after age 8.  Pneumococcal polysaccharide (PPSV23) vaccine. One dose is recommended after age 27. Talk to your health care provider about which screenings and vaccines you need and how often you need them. This information is not intended to replace advice given to you by your health care provider. Make sure you discuss any questions you have with your health care provider. Document Released: 04/26/2015 Document Revised: 12/18/2015 Document Reviewed: 01/29/2015 Elsevier Interactive Patient Education  2017 [place] Prevention in the Home Falls can cause injuries. They can happen to people of all ages. There are many things you can do to make your home safe and to help prevent falls. What can I do on the outside of my home?  Regularly fix the edges of walkways and driveways and fix any cracks.  Remove anything that might make you trip as you walk through a door, such as a raised step or threshold.  Trim any bushes or trees on the path to your home.  Use bright outdoor lighting.  Clear any walking paths of anything that might make someone trip, such as rocks or tools.  Regularly check to see if handrails are loose or broken. Make sure that both sides of any steps have handrails.  Any raised decks and porches should have guardrails on the edges.  Have any leaves, snow, or ice cleared regularly.  Use sand or salt on walking paths during winter.  Clean up any spills in your garage right away. This includes oil or grease spills. What can I do in the bathroom?  Use night lights.  Install grab bars by the toilet and in the tub and shower. Do not use towel bars as grab bars.  Use non-skid mats or decals in the tub or shower.  If you need to sit down in the shower, use a plastic, non-slip stool.  Keep the floor dry. Clean up any water that spills on the floor as soon as it happens.  Remove soap buildup in the tub or shower  regularly.  Attach bath mats securely with double-sided non-slip rug tape.  Do not have throw rugs and other things on the floor that can make you trip. What can I do in the bedroom?  Use night lights.  Make sure that you have a light by your bed that is easy to reach.  Do not use any sheets or blankets that are too big for your bed. They should not hang down onto the floor.  Have a firm chair that has side arms. You can use this for support while you get dressed.  Do not have throw rugs and other things on the floor that can make you trip. What can I do in the kitchen?  Clean up any spills right away.  Avoid walking on wet floors.  Keep items that you use a lot in easy-to-reach places.  If you need to reach something above you, use a strong step stool that has a grab bar.  Keep electrical cords out of the way.  Do not use floor polish or wax that makes floors slippery. If you must use wax, use non-skid floor wax.  Do not have throw rugs and  other things on the floor that can make you trip. What can I do with my stairs?  Do not leave any items on the stairs.  Make sure that there are handrails on both sides of the stairs and use them. Fix handrails that are broken or loose. Make sure that handrails are as long as the stairways.  Check any carpeting to make sure that it is firmly attached to the stairs. Fix any carpet that is loose or worn.  Avoid having throw rugs at the top or bottom of the stairs. If you do have throw rugs, attach them to the floor with carpet tape.  Make sure that you have a light switch at the top of the stairs and the bottom of the stairs. If you do not have them, ask someone to add them for you. What else can I do to help prevent falls?  Wear shoes that:  Do not have high heels.  Have rubber bottoms.  Are comfortable and fit you well.  Are closed at the toe. Do not wear sandals.  If you use a stepladder:  Make sure that it is fully  opened. Do not climb a closed stepladder.  Make sure that both sides of the stepladder are locked into place.  Ask someone to hold it for you, if possible.  Clearly mark and make sure that you can see:  Any grab bars or handrails.  First and last steps.  Where the edge of each step is.  Use tools that help you move around (mobility aids) if they are needed. These include:  Canes.  Walkers.  Scooters.  Crutches.  Turn on the lights when you go into a dark area. Replace any light bulbs as soon as they burn out.  Set up your furniture so you have a clear path. Avoid moving your furniture around.  If any of your floors are uneven, fix them.  If there are any pets around you, be aware of where they are.  Review your medicines with your doctor. Some medicines can make you feel dizzy. This can increase your chance of falling. Ask your doctor what other things that you can do to help prevent falls. This information is not intended to replace advice given to you by your health care provider. Make sure you discuss any questions you have with your health care provider. Document Released: 01/24/2009 Document Revised: 09/05/2015 Document Reviewed: 05/04/2014 Elsevier Interactive Patient Education  2017 [employer].

## 2017-07-06 NOTE — Progress Notes (Addendum)
Subjective:   [name] is a 84 y.o. female who presents for Medicare Annual (Subsequent) preventive examination.  Review of Systems:  N/A  Cardiac Risk Factors include: advanced age (>34men, >79 women);hypertension     Objective:     Vitals: BP 128/78 (BP Location: Left Arm)   Pulse 64   Temp 97.6 F (36.4 C) (Oral)   Ht 5\' 5"  (1.651 m)   Wt 124 lb 6.4 oz (56.4 kg)   BMI 20.70 kg/m   Body mass index is 20.7 kg/m.  Advanced Directives 07/06/2017 01/11/2017 07/14/2016 08/12/2015 08/12/2015 07/29/2015 07/21/2015  Does Patient Have a Medical Advance Directive? Yes Yes Yes - No - No  Type of Advance Directive [place];Living will [place];Living will;Out of facility DNR (pink MOST or yellow form) [place];Living will - - [place] -  Does patient want to make changes to medical advance directive? - No - Patient declined - - - - -  Copy of [place] in Chart? No - copy requested No - copy requested No - copy requested - - - -  Would patient like information on creating a medical advance directive? - - - (No Data) Yes - Educational materials given - No - patient declined information  Pre-existing out of facility DNR order (yellow form or pink MOST form) - Physician notified to receive inpatient order - - - - -    Tobacco Social History   Tobacco Use  Smoking Status Never Smoker  Smokeless Tobacco Never Used     Counseling given: Not Answered   Clinical Intake:  Pre-visit preparation completed: Yes  Pain : No/denies pain Pain Score: 0-No pain     Nutritional Status: BMI of 19-24  Normal Nutritional Risks: None Diabetes: No  How often do you need to have someone help you when you read instructions, pamphlets, or other written materials from your doctor or pharmacy?: 1 - Never  Interpreter Needed?: No  Information entered by :: [facility], LPN  Past Medical History:    Diagnosis Date  . Breast cyst    removed  . Diverticulitis   . Gallstones   . Hypertension   . Low sodium levels    Past Surgical History:  Procedure Laterality Date  . ABDOMINAL HYSTERECTOMY  2000  . APPENDECTOMY  1940's  . bladder tack  1990's  . BREAST CYST INCISION AND DRAINAGE Left 1990's  . CHOLECYSTECTOMY  09/11/13   Cholangiogram suggested a retained stone ductal dilatation.  Marland Kitchen ERCP W/ SPHICTEROTOMY  09/12/2013   Recurrent stone identified post cholecystectomy.  Marland Kitchen NASAL SINUS SURGERY     Family History  Problem Relation Age of Onset  . Hypertension Father   . CAD Father   . Cancer Brother        unknown type   Social History   Socioeconomic History  . Marital status: Widowed    Spouse name: Not on file  . Number of children: 1  . Years of education: Not on file  . Highest education level: 12th grade  Occupational History  . Occupation: retired  Scientific laboratory technician  . Financial resource strain: Not hard at all  . Food insecurity:    Worry: Never true    Inability: Never true  . Transportation needs:    Medical: No    Non-medical: No  Tobacco Use  . Smoking status: Never Smoker  . Smokeless tobacco: Never Used  Substance and Sexual  Activity  . Alcohol use: No  . Drug use: No  . Sexual activity: Not on file  Lifestyle  . Physical activity:    Days per week: Not on file    Minutes per session: Not on file  . Stress: Only a little  Relationships  . Social connections:    Talks on phone: Not on file    Gets together: Not on file    Attends religious service: Not on file    Active member of club or organization: Not on file    Attends meetings of clubs or organizations: Not on file    Relationship status: Not on file  Other Topics Concern  . Not on file  Social History Narrative  . Not on file    Outpatient Encounter Medications as of 07/06/2017  Medication Sig  . acetaminophen (TYLENOL) 325 MG tablet Take 2 tablets (650 mg total) by mouth every 6  (six) hours as needed for mild pain (or Fever >/= 101).  . enalapril (VASOTEC) 10 MG tablet Take 1 tablet (10 mg total) by mouth daily.  . feeding supplement (BOOST / RESOURCE BREEZE) LIQD Take 1 Container by mouth 3 (three) times daily between meals. (Patient taking differently: Take 1 Container by mouth 3 (three) times daily between meals. )  . metoprolol tartrate (LOPRESSOR) 25 MG tablet Take 0.5 tablets (12.5 mg total) by mouth 2 (two) times daily.  . traZODone (DESYREL) 100 MG tablet TAKE ONE TABLET BY MOUTH AT BEDTIME (Patient taking differently: TAKE ONE TABLET BY MOUTH AT BEDTIME (takes 1/2 tablet))   No facility-administered encounter medications on file as of 07/06/2017.     Activities of Daily Living In your present state of health, do you have any difficulty performing the following activities: 07/06/2017 01/11/2017  Hearing? Y Y  Comment Pt declines use of hearing aids.  -  Vision? N N  Difficulty concentrating or making decisions? Y N  Walking or climbing stairs? Tempie Donning  Comment Pt avoids steps completely due to risk of falling.  -  Dressing or bathing? N N  Doing errands, shopping? Y Y  Comment Pt does not drive.  -  Preparing Food and eating ? N -  Using the Toilet? N -  In the past six months, have you accidently leaked urine? Y -  Comment Yes, wears depends daily.  -  Do you have problems with loss of bowel control? N -  Managing your Medications? N -  Managing your Finances? N -  Housekeeping or managing your Housekeeping? N -  Some recent data might be hidden    Patient Care Team: Chrismon, Vickki Muff, PA as PCP - General (Family Medicine)    Assessment:   This is a routine wellness examination for [name].  Exercise Activities and Dietary recommendations Current Exercise Habits: The patient does not participate in regular exercise at present, Exercise limited by: Other - see comments(Pt helps take care of son that who is unable to walk.)  Goals    . DIET - INCREASE  WATER INTAKE     Recommend increasing water intake to 4-6 glasses a day.        Fall Risk Fall Risk  07/06/2017 07/14/2016 07/24/2015 03/13/2015  Falls in the past year? No Yes Yes No  Number falls in past yr: - 1 1 -  Injury with Fall? - Yes No -  Comment - staples in head on 07/21/15 - -  Risk for fall due to : - -  History of fall(s) -  Follow up - - Falls prevention discussed -   Is the patient's home free of loose throw rugs in walkways, pet beds, electrical cords, etc?   yes      Grab bars in the bathroom? yes      Handrails on the stairs?   no      Adequate lighting?   yes  Timed Get Up and Go performed: N/A  Depression Screen PHQ 2/9 Scores 07/06/2017 07/14/2016 07/14/2016 07/24/2015  PHQ - 2 Score 0 0 0 0  PHQ- 9 Score - 1 - -     Cognitive Function     6CIT Screen 07/06/2017 07/14/2016  What Year? 0 points 4 points  What month? 0 points 0 points  What time? 0 points 0 points  Count back from 20 0 points 0 points  Months in reverse 0 points 0 points  Repeat phrase 6 points 6 points  Total Score 6 10    Immunization History  Administered Date(s) Administered  . Influenza Split 01/13/2008, 01/25/2012  . Influenza, High Dose Seasonal PF 01/13/2017  . Influenza-Unspecified 12/12/2013  . Pneumococcal Conjugate-13 03/13/2015  . Pneumococcal Polysaccharide-23 04/09/2006  . Zoster 04/23/2006    Qualifies for Shingles Vaccine? Due for Shingles vaccine. Declined my offer to administer today. Education has been provided regarding the importance of this vaccine. Pt has been advised to call her insurance company to determine her out of pocket expense. Advised she may also receive this vaccine at her local pharmacy or Health Dept. Verbalized acceptance and understanding.  Screening Tests Health Maintenance  Topic Date Due  . TETANUS/TDAP  04/13/2018 (Originally 02/03/1944)  . INFLUENZA VACCINE  Completed  . DEXA SCAN  Completed  . PNA vac Low Risk Adult  Completed    Cancer  Screenings: Lung: Low Dose CT Chest recommended if Age 36-80 years, 30 pack-year currently smoking OR have quit w/in 15years. Patient does not qualify. Breast:  Up to date on Mammogram? Yes   Up to date of Bone Density/Dexa? Yes Colorectal: Up to date  Additional Screenings:  Hepatitis C Screening: N/A     Plan:  I have personally reviewed and addressed the Medicare Annual Wellness questionnaire and have noted the following in the patient's chart:  A. Medical and social history B. Use of alcohol, tobacco or illicit drugs  C. Current medications and supplements D. Functional ability and status E.  Nutritional status F.  Physical activity G. Advance directives H. List of other physicians I.  Hospitalizations, surgeries, and ER visits in previous 12 months J.  [place] such as hearing and vision if needed, cognitive and depression L. Referrals and appointments - none  In addition, I have reviewed and discussed with patient certain preventive protocols, quality metrics, and best practice recommendations. A written personalized care plan for preventive services as well as general preventive health recommendations were provided to patient.  See attached scanned questionnaire for additional information.   Signed,  Fabio Neighbors, LPN Nurse Health Advisor   Nurse Recommendations: Pt declined the tetanus vaccine today.   Reviewed documentation and recommendations of Nurse Health Advisor. Was available for consultation and agree with plan.

## 2017-07-20 ENCOUNTER — Encounter: Payer: Self-pay | Admitting: Family Medicine

## 2017-07-20 ENCOUNTER — Ambulatory Visit (INDEPENDENT_AMBULATORY_CARE_PROVIDER_SITE_OTHER): Payer: PPO | Admitting: Family Medicine

## 2017-07-20 VITALS — BP 126/76 | HR 56 | Temp 97.8°F | Ht 65.0 in | Wt 124.0 lb

## 2017-07-20 DIAGNOSIS — E871 Hypo-osmolality and hyponatremia: Secondary | ICD-10-CM | POA: Diagnosis not present

## 2017-07-20 DIAGNOSIS — I10 Essential (primary) hypertension: Secondary | ICD-10-CM

## 2017-07-20 DIAGNOSIS — Z0000 Encounter for general adult medical examination without abnormal findings: Secondary | ICD-10-CM | POA: Diagnosis not present

## 2017-07-20 DIAGNOSIS — R350 Frequency of micturition: Secondary | ICD-10-CM | POA: Diagnosis not present

## 2017-07-20 LAB — POCT URINALYSIS DIPSTICK
Bilirubin, UA: NEGATIVE
Glucose, UA: NEGATIVE
Ketones, UA: NEGATIVE
Nitrite, UA: NEGATIVE
Protein, UA: NEGATIVE
Spec Grav, UA: <=1.005 — AB (ref 1.010–1.025)
Urobilinogen, UA: 0.2 E.U./dL
pH, UA: 7.5 (ref 5.0–8.0)

## 2017-07-20 MED ORDER — METOPROLOL TARTRATE 25 MG PO TABS
12.5000 mg | ORAL_TABLET | Freq: Two times a day (BID) | ORAL | 1 refills | Status: DC
Start: 2017-07-20 — End: 2017-07-20

## 2017-07-20 MED ORDER — ENALAPRIL MALEATE 10 MG PO TABS: 10.0000 mg | ORAL_TABLET | Freq: Every day | ORAL | 3 refills | Status: DC

## 2017-07-20 MED ORDER — METOPROLOL TARTRATE 25 MG PO TABS: 12.5000 mg | ORAL_TABLET | Freq: Two times a day (BID) | ORAL | 1 refills | Status: DC

## 2017-07-20 NOTE — Progress Notes (Addendum)
Patient: [name], Female    DOB: [birth year], 84 y.o.   MRN: [id number] Visit Date: 07/20/2017  Today's Provider: Vernie Murders, PA   Chief Complaint  Patient presents with  . Annual Exam   Subjective:    Annual physical exam [name] is a 84 y.o. female who presents today for health maintenance and complete physical. She feels fairly well. She reports exercising none. She reports she is sleeping poorly.  ----------------------------------------------------------------- Patient had a AWE on 07/06/17  Review of Systems  Constitutional: Negative.   Eyes: Negative.   Respiratory: Positive for apnea.   Cardiovascular: Negative.   Gastrointestinal: Negative.   Endocrine: Negative.   Genitourinary: Negative.   Musculoskeletal: Negative.   Skin: Negative.   Allergic/Immunologic: Negative.   Neurological: Positive for dizziness.  Hematological: Negative.   Psychiatric/Behavioral: Negative.    Social History      She  reports that she has never smoked. She has never used smokeless tobacco. She reports that she does not drink alcohol or use drugs.       Social History   Socioeconomic History  . Marital status: Widowed    Spouse name: Not on file  . Number of children: 1  . Years of education: Not on file  . Highest education level: 12th grade  Occupational History  . Occupation: retired  Scientific laboratory technician  . Financial resource strain: Not hard at all  . Food insecurity:    Worry: Never true    Inability: Never true  . Transportation needs:    Medical: No    Non-medical: No  Tobacco Use  . Smoking status: Never Smoker  . Smokeless tobacco: Never Used  Substance and Sexual Activity  . Alcohol use: No  . Drug use: No  . Sexual activity: Not on file  Lifestyle  . Physical activity:    Days per week: Not on file    Minutes per session: Not on file  . Stress: Only a little  Relationships  . Social connections:    Talks on phone: Not on file    Gets  together: Not on file    Attends religious service: Not on file    Active member of club or organization: Not on file    Attends meetings of clubs or organizations: Not on file    Relationship status: Not on file  Other Topics Concern  . Not on file  Social History Narrative  . Not on file   Past Medical History:  Diagnosis Date  . Breast cyst    removed  . Diverticulitis   . Gallstones   . Hypertension   . Low sodium levels    Patient Active Problem List   Diagnosis Date Noted  . Hyponatremia 01/11/2017  . Arthritis of both hands 01/14/2016  . Acute UTI 08/21/2015  . Scalp laceration 07/29/2015  . Fall 07/29/2015  . Near syncope 07/21/2015  . Foot pain 12/31/2014  . Anxiety 10/17/2014  . Allergic rhinitis 10/16/2014  . Airway hyperreactivity 10/16/2014  . Biliary calculi, common bile duct 10/16/2014  . Clinical depression 10/16/2014  . 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 Clicking shoulder 25/08/3974  . Essential (primary) hypertension 10/16/2014  . H/O gastrointestinal hemorrhage 10/16/2014  . 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 Below normal amount of sodium in the blood 10/16/2014  . Insomnia 10/16/2014  . Bad memory 10/16/2014  . OP (osteoporosis) 10/16/2014  . Cyst of pancreas 10/16/2014  . Temporary cerebral vascular dysfunction 10/16/2014  .  Other constipation 03/07/2014  . Right groin pain 03/07/2014  . Internal hemorrhoid 03/07/2014  . Gallstones, common bile duct 08/29/2013  . Cholelithiasis 08/29/2013  . CYST AND PSEUDOCYST OF PANCREAS 03/12/2008   Past Surgical History:  Procedure Laterality Date  . ABDOMINAL HYSTERECTOMY  2000  . APPENDECTOMY  1940's  . bladder tack  1990's  . BREAST CYST INCISION AND DRAINAGE Left 1990's  . CHOLECYSTECTOMY  09/11/13   Cholangiogram suggested a retained stone ductal dilatation.  Marland Kitchen ERCP W/ SPHICTEROTOMY  09/12/2013   Recurrent stone identified post cholecystectomy.  Marland Kitchen NASAL SINUS SURGERY     Family History          Family Status  Relation Name Status  . Mother  Deceased  . Father  Deceased  . Sister  Deceased at age 84       failure to thrive  . Brother  Deceased        Her family history includes CAD in her father; Cancer in her brother; Hypertension in her father.     Allergies  Allergen Reactions  . Accolate  [Zafirlukast]   . Bactrim [Sulfamethoxazole-Trimethoprim] Nausea And Vomiting  . Metronidazole   . Nitrofurantoin Monohyd Macro Other (See Comments)    GI upset  . Penicillins Diarrhea  . Keflex [Cephalexin] Rash    Current Outpatient Medications:  .  acetaminophen (TYLENOL) 325 MG tablet, Take 2 tablets (650 mg total) by mouth every 6 (six) hours as needed for mild pain (or Fever >/= 101)., Disp: , Rfl:  .  enalapril (VASOTEC) 10 MG tablet, Take 1 tablet (10 mg total) by mouth daily., Disp: 30 tablet, Rfl: 0 .  feeding supplement (BOOST / RESOURCE BREEZE) LIQD, Take 1 Container by mouth 3 (three) times daily between meals. (Patient taking differently: Take 1 Container by mouth 3 (three) times daily between meals. ), Disp: 30 Container, Rfl: 0 .  metoprolol tartrate (LOPRESSOR) 25 MG tablet, Take 0.5 tablets (12.5 mg total) by mouth 2 (two) times daily., Disp: 180 tablet, Rfl: 1 .  traZODone (DESYREL) 100 MG tablet, TAKE ONE TABLET BY MOUTH AT BEDTIME (Patient taking differently: TAKE ONE TABLET BY MOUTH AT BEDTIME (takes 1/2 tablet)), Disp: 90 tablet, Rfl: 2   Patient Care Team: Chrismon, Vickki Muff, PA as PCP - General (Family Medicine)     Objective:   Vitals: BP 126/76 (BP Location: Right Arm, Patient Position: Sitting, Cuff Size: Normal)   Pulse (!) 56   Temp 97.8 F (36.6 C) (Oral)   Ht 5\' 5"  (1.651 m)   Wt 124 lb (56.2 kg)   SpO2 98%   BMI 20.63 kg/m  Wt Readings from Last 3 Encounters:  07/20/17 124 lb (56.2 kg)  07/06/17 124 lb 6.4 oz (56.4 kg)  03/09/17 117 lb 9.6 oz (53.3 kg)   Physical Exam  Constitutional: She is oriented to person, place, and time.  She appears well-developed and well-nourished. No distress.  HENT:  Head: Normocephalic and atraumatic.  Right Ear: Hearing and external ear normal.  Left Ear: Hearing and external ear normal.  Nose: Nose normal.  Mouth/Throat: Oropharynx is clear and moist.  Eyes: Conjunctivae, EOM and lids are normal. Right eye exhibits no discharge. Left eye exhibits no discharge. No scleral icterus.  Neck: Normal range of motion. Neck supple.  Cardiovascular: Normal rate, regular rhythm, normal heart sounds and intact distal pulses.  Pulmonary/Chest: Effort normal and breath sounds normal. No respiratory distress.  Abdominal: Soft. Bowel sounds are normal.  Musculoskeletal: Normal  range of motion.  Neurological: She is alert and oriented to person, place, and time.  Skin: Skin is warm, dry and intact. No lesion and no rash noted.  Psychiatric: She has a normal mood and affect. Her speech is normal and behavior is normal. Thought content normal.    Depression Screen PHQ 2/9 Scores 07/06/2017 07/14/2016 07/14/2016 07/24/2015  PHQ - 2 Score 0 0 0 0  PHQ- 9 Score - 1 - -   Assessment & Plan:     Routine Health Maintenance and Physical Exam  Exercise Activities and Dietary recommendations Goals    . DIET - INCREASE WATER INTAKE     Recommend increasing water intake to 4-6 glasses a day.        Immunization History  Administered Date(s) Administered  . Influenza Split 01/13/2008, 01/25/2012  . Influenza, High Dose Seasonal PF 01/13/2017  . Influenza-Unspecified 12/12/2013  . Pneumococcal Conjugate-13 03/13/2015  . Pneumococcal Polysaccharide-23 04/09/2006  . Zoster 04/23/2006    Health Maintenance  Topic Date Due  . TETANUS/TDAP  04/13/2018 (Originally 02/03/1944)  . INFLUENZA VACCINE  11/11/2017  . DEXA SCAN  Completed  . PNA vac Low Risk Adult  Completed     Discussed health benefits of physical activity, and encouraged her to engage in regular exercise appropriate for her age and  condition.    --------------------------------------------------------------------  1. Annual physical exam General health good for age. Appears younger than stated age. Completed medicare AWE on 07-06-17. Still very self-sufficient. Recheck annually.  2. Essential (primary) hypertension Stable and well controlled by Vasotec 10 mg qd and Metoprolol Tartrate 12.5 mg BID. Continue to drink plenty of fluids and limit caffeine consumption. Refilled medications and recheck prn. Follow up with nephrologist and get labs per Dr. Candiss Norse. - POCT Urinalysis Dipstick - metoprolol tartrate (LOPRESSOR) 25 MG tablet; Take 0.5 tablets (12.5 mg total) by mouth 2 (two) times daily.  Dispense: 180 tablet; Refill: 1 - enalapril (VASOTEC) 10 MG tablet; Take 1 tablet (10 mg total) by mouth daily.  Dispense: 90 tablet; Refill: 3  3. Hyponatremia History of low sodium in October 2018 of 126. After hospitalization and IV treatment with medications adjusted, it was 135 on 02-18-17 when evaluated by Dr. Candiss Norse (nephrologist). No edema today. Weight stable.  4. Urinary frequency Slight urinary frequency the past few days. No hematuria but urinalysis shows 1+ leukocytes but negative nitrites. Will get C&S and follow up as needed. - POCT Urinalysis Dipstick - Urine Culture    Vernie Murders, PA  [facility]

## 2017-07-22 ENCOUNTER — Other Ambulatory Visit: Payer: Self-pay | Admitting: Family Medicine

## 2017-07-22 ENCOUNTER — Telehealth: Payer: Self-pay

## 2017-07-22 LAB — URINE CULTURE

## 2017-07-22 NOTE — Telephone Encounter (Signed)
-----   Message from Margo Common, [place] sent at 07/22/2017  3:15 PM EDT ----- Mixed bacteria on urine culture. Need antibiotic (which pharmacy to use?).

## 2017-07-22 NOTE — Telephone Encounter (Signed)
Patient called back and was advised as below. Patient agrees with treatment plan. Patient uses [facility] (in [place]).

## 2017-07-23 ENCOUNTER — Other Ambulatory Visit: Payer: Self-pay | Admitting: Family Medicine

## 2017-07-23 DIAGNOSIS — N390 Urinary tract infection, site not specified: Secondary | ICD-10-CM

## 2017-07-23 MED ORDER — DOXYCYCLINE HYCLATE 100 MG PO TABS: 100.0000 mg | ORAL_TABLET | Freq: Two times a day (BID) | ORAL | 0 refills | Status: DC

## 2017-07-23 NOTE — Telephone Encounter (Signed)
Pt called saying she checked with the pharamacy last night and nothing has been called into her pharmacy  [facility]  Pt's call back [phone]  Thanks Con Memos

## 2017-07-23 NOTE — Telephone Encounter (Signed)
Patient advised.

## 2017-07-23 NOTE — Telephone Encounter (Signed)
Sorry for the delay. Sent medication now. Should recheck urine in a week if any symptoms remain.

## 2017-08-10 ENCOUNTER — Ambulatory Visit (INDEPENDENT_AMBULATORY_CARE_PROVIDER_SITE_OTHER): Payer: PPO | Admitting: Family Medicine

## 2017-08-10 ENCOUNTER — Encounter: Payer: Self-pay | Admitting: Family Medicine

## 2017-08-10 VITALS — BP 144/78 | HR 67 | Temp 97.7°F | Resp 16 | Wt 123.8 lb

## 2017-08-10 DIAGNOSIS — N390 Urinary tract infection, site not specified: Secondary | ICD-10-CM | POA: Diagnosis not present

## 2017-08-10 DIAGNOSIS — R42 Dizziness and giddiness: Secondary | ICD-10-CM

## 2017-08-10 LAB — POCT URINALYSIS DIPSTICK
Bilirubin, UA: NEGATIVE
Blood, UA: NEGATIVE
Glucose, UA: NEGATIVE
Ketones, UA: NEGATIVE
Nitrite, UA: NEGATIVE
Odor: NORMAL
Protein, UA: NEGATIVE
Spec Grav, UA: 1.015 (ref 1.010–1.025)
Urobilinogen, UA: 0.2 E.U./dL
pH, UA: 6 (ref 5.0–8.0)

## 2017-08-10 MED ORDER — CIPROFLOXACIN HCL 250 MG PO TABS: 250.0000 mg | ORAL_TABLET | Freq: Two times a day (BID) | ORAL | 0 refills | Status: DC

## 2017-08-10 NOTE — Patient Instructions (Addendum)
Consider holding trazodone for a few nights and see if dizziness will improve. We will call you with the lab results.

## 2017-08-10 NOTE — Progress Notes (Signed)
  Subjective:     Patient ID: [name], female   DOB: 05-04-1924, 84 y.o.   MRN: [id number] Chief Complaint  Patient presents with  . Urinary Tract Infection    recently finnished antibiotic and was told to have rechecked  . Dizziness    with weakness   HPI Describes continued urinary urgency and frequency. States 3 days after she finished the abx she bent over and became significantly dizzy.She has fallen once without injury. Currently taking 50 mg.of trazodone at night to help sleep.  Review of Systems     Objective:   Physical Exam  Constitutional: She appears well-developed and well-nourished. No distress.  Cardiovascular: Normal rate and regular rhythm.  Pulmonary/Chest: Breath sounds normal.  Musculoskeletal: She exhibits no edema (of lower extremities).       Assessment:    1. Urinary tract infection without hematuria, site unspecified - POCT urinalysis dipstick - Urine Culture - ciprofloxacin (CIPRO) 250 MG tablet; Take 1 tablet (250 mg total) by mouth 2 (two) times daily.  Dispense: 14 tablet; Refill: 0  2. Dizziness: asked to stop trazodone - CBC with Differential/Platelet - Comprehensive metabolic panel    Plan:    Further f/u pending lab results.

## 2017-08-11 LAB — COMPREHENSIVE METABOLIC PANEL
ALT: 14 IU/L (ref 0–32)
AST: 29 IU/L (ref 0–40)
Albumin/Globulin Ratio: 2.1 (ref 1.2–2.2)
Albumin: 4.6 g/dL (ref 3.2–4.6)
Alkaline Phosphatase: 111 IU/L (ref 39–117)
BUN/Creatinine Ratio: 21 (ref 12–28)
BUN: 17 mg/dL (ref 10–36)
Bilirubin Total: 0.3 mg/dL (ref 0.0–1.2)
CO2: 24 mmol/L (ref 20–29)
Calcium: 9.7 mg/dL (ref 8.7–10.3)
Chloride: 92 mmol/L — ABNORMAL LOW (ref 96–106)
Creatinine, Ser: 0.81 mg/dL (ref 0.57–1.00)
GFR calc Af Amer: 74 mL/min/{1.73_m2} (ref >59)
GFR calc non Af Amer: 64 mL/min/{1.73_m2} (ref >59)
Globulin, Total: 2.2 g/dL (ref 1.5–4.5)
Glucose: 80 mg/dL (ref 65–99)
Potassium: 4.1 mmol/L (ref 3.5–5.2)
Sodium: 132 mmol/L — ABNORMAL LOW (ref 134–144)
Total Protein: 6.8 g/dL (ref 6.0–8.5)

## 2017-08-11 LAB — CBC WITH DIFFERENTIAL/PLATELET
Basophils Absolute: 0 10*3/uL (ref 0.0–0.2)
Basos: 0 %
EOS (ABSOLUTE): 0.1 10*3/uL (ref 0.0–0.4)
Eos: 1 %
Hematocrit: 35.2 % (ref 34.0–46.6)
Hemoglobin: 11.8 g/dL (ref 11.1–15.9)
Immature Grans (Abs): 0 10*3/uL (ref 0.0–0.1)
Immature Granulocytes: 0 %
Lymphocytes Absolute: 1.7 10*3/uL (ref 0.7–3.1)
Lymphs: 26 %
MCH: 31.9 pg (ref 26.6–33.0)
MCHC: 33.5 g/dL (ref 31.5–35.7)
MCV: 95 fL (ref 79–97)
Monocytes Absolute: 0.6 10*3/uL (ref 0.1–0.9)
Monocytes: 10 %
Neutrophils Absolute: 3.9 10*3/uL (ref 1.4–7.0)
Neutrophils: 63 %
Platelets: 182 10*3/uL (ref 150–379)
RBC: 3.7 x10E6/uL — ABNORMAL LOW (ref 3.77–5.28)
RDW: 13.1 % (ref 12.3–15.4)
WBC: 6.3 10*3/uL (ref 3.4–10.8)

## 2017-08-11 LAB — URINE CULTURE

## 2017-08-12 ENCOUNTER — Telehealth: Payer: Self-pay

## 2017-08-12 NOTE — Telephone Encounter (Signed)
-----   Message from Carmon Ginsberg, [place] sent at 08/12/2017  7:36 AM EDT ----- No organism detected on culture. May stop Cipro. If still having urinary symptoms or dizziness-do follow up with Simona Huh.

## 2017-08-12 NOTE — Telephone Encounter (Signed)
Patient advised.KW 

## 2017-08-12 NOTE — Telephone Encounter (Signed)
[id number] 

## 2017-08-16 ENCOUNTER — Other Ambulatory Visit: Payer: Self-pay

## 2017-08-16 ENCOUNTER — Encounter: Payer: Self-pay | Admitting: Emergency Medicine

## 2017-08-16 ENCOUNTER — Encounter: Payer: Self-pay | Admitting: Family Medicine

## 2017-08-16 ENCOUNTER — Ambulatory Visit (INDEPENDENT_AMBULATORY_CARE_PROVIDER_SITE_OTHER): Payer: PPO | Admitting: Family Medicine

## 2017-08-16 ENCOUNTER — Inpatient Hospital Stay
Admission: EM | Admit: 2017-08-16 | Discharge: 2017-08-20 | DRG: 640 | Disposition: A | Discharge status: Home Health | Payer: PPO | Attending: Internal Medicine | Admitting: Internal Medicine

## 2017-08-16 VITALS — BP 134/78 | HR 62 | Temp 97.5°F | Wt 119.4 lb

## 2017-08-16 DIAGNOSIS — I959 Hypotension, unspecified: Secondary | ICD-10-CM | POA: Diagnosis present

## 2017-08-16 DIAGNOSIS — Z66 Do not resuscitate: Secondary | ICD-10-CM | POA: Diagnosis not present

## 2017-08-16 DIAGNOSIS — I6523 Occlusion and stenosis of bilateral carotid arteries: Secondary | ICD-10-CM | POA: Diagnosis not present

## 2017-08-16 DIAGNOSIS — Z87440 Personal history of urinary (tract) infections: Secondary | ICD-10-CM | POA: Diagnosis not present

## 2017-08-16 DIAGNOSIS — I10 Essential (primary) hypertension: Secondary | ICD-10-CM | POA: Diagnosis present

## 2017-08-16 DIAGNOSIS — E861 Hypovolemia: Secondary | ICD-10-CM | POA: Diagnosis not present

## 2017-08-16 DIAGNOSIS — G9341 Metabolic encephalopathy: Secondary | ICD-10-CM | POA: Diagnosis present

## 2017-08-16 DIAGNOSIS — R5381 Other malaise: Secondary | ICD-10-CM

## 2017-08-16 DIAGNOSIS — E871 Hypo-osmolality and hyponatremia: Principal | ICD-10-CM | POA: Diagnosis present

## 2017-08-16 DIAGNOSIS — R55 Syncope and collapse: Secondary | ICD-10-CM | POA: Diagnosis not present

## 2017-08-16 DIAGNOSIS — R5383 Other fatigue: Secondary | ICD-10-CM

## 2017-08-16 DIAGNOSIS — N390 Urinary tract infection, site not specified: Secondary | ICD-10-CM | POA: Diagnosis present

## 2017-08-16 DIAGNOSIS — Z79899 Other long term (current) drug therapy: Secondary | ICD-10-CM | POA: Diagnosis not present

## 2017-08-16 DIAGNOSIS — G4700 Insomnia, unspecified: Secondary | ICD-10-CM

## 2017-08-16 DIAGNOSIS — G9340 Encephalopathy, unspecified: Secondary | ICD-10-CM | POA: Diagnosis not present

## 2017-08-16 LAB — BASIC METABOLIC PANEL
Anion gap: 8 (ref 5–15)
BUN: 23 mg/dL — ABNORMAL HIGH (ref 6–20)
CO2: 25 mmol/L (ref 22–32)
Calcium: 9.3 mg/dL (ref 8.9–10.3)
Chloride: 93 mmol/L — ABNORMAL LOW (ref 101–111)
Creatinine, Ser: 1 mg/dL (ref 0.44–1.00)
GFR calc Af Amer: 56 mL/min — ABNORMAL LOW (ref >60)
GFR calc non Af Amer: 48 mL/min — ABNORMAL LOW (ref >60)
Glucose, Bld: 94 mg/dL (ref 65–99)
Potassium: 4.1 mmol/L (ref 3.5–5.1)
Sodium: 126 mmol/L — ABNORMAL LOW (ref 135–145)

## 2017-08-16 LAB — URINALYSIS, COMPLETE (UACMP) WITH MICROSCOPIC
Bacteria, UA: NONE SEEN
Bilirubin Urine: NEGATIVE
Glucose, UA: NEGATIVE mg/dL
Hgb urine dipstick: NEGATIVE
Ketones, ur: NEGATIVE mg/dL
Nitrite: NEGATIVE
Protein, ur: NEGATIVE mg/dL
Specific Gravity, Urine: 1.011 (ref 1.005–1.030)
WBC, UA: >50 WBC/hpf — ABNORMAL HIGH (ref 0–5)
pH: 6 (ref 5.0–8.0)

## 2017-08-16 LAB — CBC
HCT: 36.3 % (ref 35.0–47.0)
Hemoglobin: 12.7 g/dL (ref 12.0–16.0)
MCH: 32.3 pg (ref 26.0–34.0)
MCHC: 34.9 g/dL (ref 32.0–36.0)
MCV: 92.6 fL (ref 80.0–100.0)
Platelets: 194 10*3/uL (ref 150–440)
RBC: 3.93 MIL/uL (ref 3.80–5.20)
RDW: 12.8 % (ref 11.5–14.5)
WBC: 8.2 10*3/uL (ref 3.6–11.0)

## 2017-08-16 LAB — POCT URINALYSIS DIPSTICK
Bilirubin, UA: NEGATIVE
Blood, UA: NEGATIVE
Glucose, UA: NEGATIVE
Ketones, UA: NEGATIVE
Leukocytes, UA: NEGATIVE
Nitrite, UA: NEGATIVE
Protein, UA: 30
Spec Grav, UA: 1.02 (ref 1.010–1.025)
Urobilinogen, UA: 0.2 E.U./dL
pH, UA: 6 (ref 5.0–8.0)

## 2017-08-16 NOTE — ED Provider Notes (Signed)
[facility] Emergency Department Provider Note   ____________________________________________   I have reviewed the triage vital signs and the nursing notes.   HISTORY  Chief Complaint Loss of Consciousness   History limited by: Not Limited   HPI [name] is a 84 y.o. female who presents to the emergency department today because of concern for syncopal episode. Patient states that she felt light headed and told family she was feeling poorly. Family states that she went down to the ground and was unresponsive for 5-10 minutes. When EMS arrived her blood pressure was noted to be low. The patient denies any chest pain or palpitations at the time of the incident. Denies any recent fevers.   Per medical record review patient has a history of HTN, low sodium levels.   Past Medical History:  Diagnosis Date  . Breast cyst    removed  . Diverticulitis   . Gallstones   . Hypertension   . Low sodium levels     Patient Active Problem List   Diagnosis Date Noted  . Hyponatremia 01/11/2017  . Arthritis of both hands 01/14/2016  . Acute UTI 08/21/2015  . Scalp laceration 07/29/2015  . Fall 07/29/2015  . Near syncope 07/21/2015  . Foot pain 12/31/2014  . Anxiety 10/17/2014  . Allergic rhinitis 10/16/2014  . Airway hyperreactivity 10/16/2014  . Biliary calculi, common bile duct 10/16/2014  . Clinical depression 10/16/2014  . 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 Clicking shoulder 95/28/4132  . Essential (primary) hypertension 10/16/2014  . H/O gastrointestinal hemorrhage 10/16/2014  . 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 Below normal amount of sodium in the blood 10/16/2014  . Insomnia 10/16/2014  . Bad memory 10/16/2014  . OP (osteoporosis) 10/16/2014  . Cyst of pancreas 10/16/2014  . Temporary cerebral vascular dysfunction 10/16/2014  . Other constipation 03/07/2014  . Right groin pain 03/07/2014  . Internal hemorrhoid 03/07/2014  .  Gallstones, common bile duct 08/29/2013  . Cholelithiasis 08/29/2013  . CYST AND PSEUDOCYST OF PANCREAS 03/12/2008    Past Surgical History:  Procedure Laterality Date  . ABDOMINAL HYSTERECTOMY  2000  . APPENDECTOMY  1940's  . bladder tack  1990's  . BREAST CYST INCISION AND DRAINAGE Left 1990's  . CHOLECYSTECTOMY  09/11/13   Cholangiogram suggested a retained stone ductal dilatation.  Marland Kitchen ERCP W/ SPHICTEROTOMY  09/12/2013   Recurrent stone identified post cholecystectomy.  Marland Kitchen NASAL SINUS SURGERY      Prior to Admission medications   Medication Sig Start Date End Date Taking? Authorizing Provider  acetaminophen (TYLENOL) 325 MG tablet Take 2 tablets (650 mg total) by mouth every 6 (six) hours as needed for mild pain (or Fever >/= 101). 07/23/15   Gouru, Illene Silver, MD  enalapril (VASOTEC) 10 MG tablet Take 1 tablet (10 mg total) by mouth daily. 07/20/17   Chrismon, Vickki Muff, PA  feeding supplement (BOOST / RESOURCE BREEZE) LIQD Take 1 Container by mouth 3 (three) times daily between meals. Patient taking differently: Take 1 Container by mouth 3 (three) times daily between meals.  01/14/17   Epifanio Lesches, MD  metoprolol tartrate (LOPRESSOR) 25 MG tablet Take 0.5 tablets (12.5 mg total) by mouth 2 (two) times daily. 07/20/17   Chrismon, Vickki Muff, PA  traZODone (DESYREL) 100 MG tablet TAKE ONE TABLET BY MOUTH AT BEDTIME Patient taking differently: TAKE ONE TABLET BY MOUTH AT BEDTIME (takes 1/2 tablet) 04/14/17   Birdie Sons, MD    Allergies Accolate  [zafirlukast]; Bactrim [sulfamethoxazole-trimethoprim]; Metronidazole;  Nitrofurantoin monohyd macro; Penicillins; and Keflex [cephalexin]  Family History  Problem Relation Age of Onset  . Hypertension Father   . CAD Father   . Cancer Brother        unknown type    Social History Social History   Tobacco Use  . Smoking status: Never Smoker  . Smokeless tobacco: Never Used  Substance Use Topics  . Alcohol use: No  . Drug use: No     Review of Systems Constitutional: No fever/chills Eyes: No visual changes. ENT: No sore throat. Cardiovascular: Denies chest pain. Respiratory: Denies shortness of breath. Gastrointestinal: No abdominal pain.  No nausea, no vomiting.  No diarrhea.   Genitourinary: Negative for dysuria. Musculoskeletal: Negative for back pain. Skin: Negative for rash. Neurological: Negative for headaches, focal weakness or numbness.  ____________________________________________   PHYSICAL EXAM:  VITAL SIGNS: ED Triage Vitals  Enc Vitals Group     BP 08/16/17 2132 (!) 155/53     Pulse Rate 08/16/17 2132 64     Resp 08/16/17 2132 16     Temp 08/16/17 2134 (!) 97.4 F (36.3 C)     Temp Source 08/16/17 2134 Oral     SpO2 08/16/17 2132 96 %     Weight 08/16/17 2128 119 lb (54 kg)     Height 08/16/17 2128 5\' 4"  (1.626 m)     Head Circumference --      Peak Flow --      Pain Score 08/16/17 2128 0   Constitutional: Alert and oriented. Well appearing and in no distress. Eyes: Conjunctivae are normal.  ENT   Head: Normocephalic and atraumatic.   Nose: No congestion/rhinnorhea.   Mouth/Throat: Mucous membranes are moist.   Neck: No stridor. Hematological/Lymphatic/Immunilogical: No cervical lymphadenopathy. Cardiovascular: Normal rate, regular rhythm.  No murmurs, rubs, or gallops.  Respiratory: Normal respiratory effort without tachypnea nor retractions. Breath sounds are clear and equal bilaterally. No wheezes/rales/rhonchi. Gastrointestinal: Soft and non tender. No rebound. No guarding.  Genitourinary: Deferred Musculoskeletal: Normal range of motion in all extremities. No lower extremity edema. Neurologic:  Normal speech and language. No gross focal neurologic deficits are appreciated.  Skin:  Skin is warm, dry and intact. No rash noted. Psychiatric: Mood and affect are normal. Speech and behavior are normal. Patient exhibits appropriate insight and  judgment.  ____________________________________________    LABS (pertinent positives/negatives)  BMP na 126, k 4.1, cr 1.00 Trop <0.03 CBC wnl UA large leukocytes, >50 wbcs, budding yeast present, no bacteria seen  ____________________________________________   EKG  I, Nance Pear, attending physician, personally viewed and interpreted this EKG  EKG Time: 2124 Rate: 65 Rhythm: sinus rhythm  Axis: normal Intervals: qtc 463 QRS: incomplete RBBB ST changes: no st elevation Impression: abnormal ekg   ____________________________________________    RADIOLOGY  None  ____________________________________________   PROCEDURES  Procedures  ____________________________________________   INITIAL IMPRESSION / ASSESSMENT AND PLAN / ED COURSE  Pertinent labs & imaging results that were available during my care of the patient were reviewed by me and considered in my medical decision making (see chart for details).  Patient presented to the emergency department today after a syncopal episode.  Patient did have initial low blood pressure for EMS which I think is related to the syncopal episode.  Here in the emergency department blood pressure was much better.  Blood work was notable for a low sodium level. Will plan on admission to the hospital. Discussed findings and plan with patient.    ____________________________________________  FINAL CLINICAL IMPRESSION(S) / ED DIAGNOSES  Final diagnoses:  Syncope, unspecified syncope type  Hyponatremia     Note: This dictation was prepared with Dragon dictation. Any transcriptional errors that result from this process are unintentional     Nance Pear, MD 08/17/17 1731

## 2017-08-16 NOTE — Progress Notes (Signed)
[name]    DOB: 10-19-1924   84 y.o.   MRN: [id number] Visit Date: 08/16/2017  Today's Provider: Vernie Murders, PA   Chief Complaint  Patient presents with  . Leg Pain   Subjective:    Leg Pain   Incident onset: 1 week ago. The pain is present in the left leg and right leg. The quality of the pain is described as aching. The pain has been constant since onset. Associated symptoms include an inability to bear weight (at times). She reports no foreign bodies present. The symptoms are aggravated by weight bearing. She has tried nothing for the symptoms.      Past Medical History:  Diagnosis Date  . Breast cyst    removed  . Diverticulitis   . Gallstones   . Hypertension   . Low sodium levels    Past Surgical History:  Procedure Laterality Date  . ABDOMINAL HYSTERECTOMY  2000  . APPENDECTOMY  1940's  . bladder tack  1990's  . BREAST CYST INCISION AND DRAINAGE Left 1990's  . CHOLECYSTECTOMY  09/11/13   Cholangiogram suggested a retained stone ductal dilatation.  Marland Kitchen ERCP W/ SPHICTEROTOMY  09/12/2013   Recurrent stone identified post cholecystectomy.  Marland Kitchen NASAL SINUS SURGERY     Family History  Problem Relation Age of Onset  . Hypertension Father   . CAD Father   . Cancer Brother        unknown type   Allergies  Allergen Reactions  . Accolate  [Zafirlukast]   . Bactrim [Sulfamethoxazole-Trimethoprim] Nausea And Vomiting  . Metronidazole   . Nitrofurantoin Monohyd Macro Other (See Comments)    GI upset  . Penicillins Diarrhea  . Keflex [Cephalexin] Rash    Current Outpatient Medications:  .  acetaminophen (TYLENOL) 325 MG tablet, Take 2 tablets (650 mg total) by mouth every 6 (six) hours as needed for mild pain (or Fever >/= 101)., Disp: , Rfl:  .  enalapril (VASOTEC) 10 MG tablet, Take 1 tablet (10 mg total) by mouth daily., Disp: 90 tablet, Rfl: 3 .  feeding supplement (BOOST / RESOURCE BREEZE) LIQD, Take 1 Container by mouth 3 (three)  times daily between meals. (Patient taking differently: Take 1 Container by mouth 3 (three) times daily between meals. ), Disp: 30 Container, Rfl: 0 .  metoprolol tartrate (LOPRESSOR) 25 MG tablet, Take 0.5 tablets (12.5 mg total) by mouth 2 (two) times daily., Disp: 180 tablet, Rfl: 1 .  traZODone (DESYREL) 100 MG tablet, TAKE ONE TABLET BY MOUTH AT BEDTIME (Patient taking differently: TAKE ONE TABLET BY MOUTH AT BEDTIME (takes 1/2 tablet)), Disp: 90 tablet, Rfl: 2  Review of Systems  Constitutional: Negative.   Respiratory: Negative.   Cardiovascular: Negative.   Musculoskeletal:       Bilateral leg pain    Social History   Tobacco Use  . Smoking status: Never Smoker  . Smokeless tobacco: Never Used  Substance Use Topics  . Alcohol use: No   Objective:   BP 134/78 (BP Location: Right Arm, Patient Position: Sitting, Cuff Size: Normal)   Pulse 62   Temp (!) 97.5 F (36.4 C) (Oral)   Wt 119 lb 6.4 oz (54.2 kg)   SpO2 99%   BMI 19.87 kg/m  Wt Readings from Last 3 Encounters:  08/16/17 119 lb 6.4 oz (54.2 kg)  08/10/17 123 lb 12.8 oz (56.2 kg)  07/20/17 124 lb (56.2 kg)   Physical Exam  Constitutional: She appears  well-developed and well-nourished.  HENT:  Head: Normocephalic.  Eyes: Conjunctivae and EOM are normal.  Neck: No JVD present. No thyromegaly present.  Cardiovascular: Normal rate and regular rhythm.  Pulmonary/Chest: Effort normal and breath sounds normal.  Abdominal: Soft. Bowel sounds are normal.  Neurological: She is alert.  Psychiatric: She has a normal mood and affect.      Assessment & Plan:     1. Malaise and fatigue Generalized malaise and fatigue with some aching in legs the past week. Denies nausea, vomiting or diarrhea. No recheck URI symptoms or fever. Doubt she is eating or drinking enough. Given samples of Ensure to get one bottle twice a day between meals and encouraged to drink plenty of fluids. Has been off the Trazodone the past week because  of some lightheaded sensation. Blood tests on 08-10-17 showed hyponatremia with sodium 132. Weight down about 4 lbs since 08-10-17. Recheck in 3-4 weeks.  2. History of UTI Treated with Doxycycline for UTI from mixed flora on 07-23-17. Continued to have some urinary urgency and frequency. Recheck of urinalysis on 08-10-17 showed moderated leukocytes and was started on Cipro. C&S report on 08-11-17 showed less than 10,000 colonies of mixed flora and all antibiotics were discontinued. She was encouraged to drink extra fluids and scheduled for this recheck to culture-for-cure. Urinalysis today was essentially clear. Encouraged to drink plenty of fluids and recheck as planned (or sooner if needed). - POCT urinalysis dipstick       Vernie Murders, PA  [facility]

## 2017-08-16 NOTE — ED Triage Notes (Signed)
Patient from home via [facility]. Per EMS, family reports patient had syncopal episode and was "slow to come around". Upon EMS arrival, patient was slow to respond to questions but able to answer appropriately. EMS also states patient was hypotensive upon their arrival with a pressure of 70/40. Patient given 221ml of NS by EMS. Upon arrival patient is alert and oriented, sitting up with NAD noted. Denies any pain or other complaints at this time.

## 2017-08-17 ENCOUNTER — Other Ambulatory Visit: Payer: Self-pay

## 2017-08-17 ENCOUNTER — Inpatient Hospital Stay (HOSPITAL_COMMUNITY)
Admit: 2017-08-17 | Discharge: 2017-08-17 | Disposition: A | Discharge status: Home / Self Care | Payer: PPO | Attending: Internal Medicine | Admitting: Internal Medicine

## 2017-08-17 ENCOUNTER — Inpatient Hospital Stay: Payer: PPO

## 2017-08-17 DIAGNOSIS — R55 Syncope and collapse: Secondary | ICD-10-CM | POA: Diagnosis present

## 2017-08-17 DIAGNOSIS — Z66 Do not resuscitate: Secondary | ICD-10-CM | POA: Diagnosis present

## 2017-08-17 DIAGNOSIS — G9341 Metabolic encephalopathy: Secondary | ICD-10-CM | POA: Diagnosis present

## 2017-08-17 DIAGNOSIS — E871 Hypo-osmolality and hyponatremia: Secondary | ICD-10-CM | POA: Diagnosis present

## 2017-08-17 DIAGNOSIS — I959 Hypotension, unspecified: Secondary | ICD-10-CM | POA: Diagnosis present

## 2017-08-17 DIAGNOSIS — N390 Urinary tract infection, site not specified: Secondary | ICD-10-CM | POA: Diagnosis present

## 2017-08-17 DIAGNOSIS — Z79899 Other long term (current) drug therapy: Secondary | ICD-10-CM | POA: Diagnosis not present

## 2017-08-17 DIAGNOSIS — E861 Hypovolemia: Secondary | ICD-10-CM | POA: Diagnosis present

## 2017-08-17 DIAGNOSIS — I10 Essential (primary) hypertension: Secondary | ICD-10-CM | POA: Diagnosis present

## 2017-08-17 LAB — CBC
HCT: 36.8 % (ref 35.0–47.0)
Hemoglobin: 12.9 g/dL (ref 12.0–16.0)
MCH: 32.6 pg (ref 26.0–34.0)
MCHC: 35 g/dL (ref 32.0–36.0)
MCV: 93.3 fL (ref 80.0–100.0)
Platelets: 189 10*3/uL (ref 150–440)
RBC: 3.94 MIL/uL (ref 3.80–5.20)
RDW: 12.9 % (ref 11.5–14.5)
WBC: 10.9 10*3/uL (ref 3.6–11.0)

## 2017-08-17 LAB — BASIC METABOLIC PANEL
Anion gap: 8 (ref 5–15)
BUN: 25 mg/dL — ABNORMAL HIGH (ref 6–20)
CO2: 23 mmol/L (ref 22–32)
Calcium: 9 mg/dL (ref 8.9–10.3)
Chloride: 95 mmol/L — ABNORMAL LOW (ref 101–111)
Creatinine, Ser: 0.98 mg/dL (ref 0.44–1.00)
GFR calc Af Amer: 57 mL/min — ABNORMAL LOW (ref >60)
GFR calc non Af Amer: 49 mL/min — ABNORMAL LOW (ref >60)
Glucose, Bld: 89 mg/dL (ref 65–99)
Potassium: 4 mmol/L (ref 3.5–5.1)
Sodium: 126 mmol/L — ABNORMAL LOW (ref 135–145)

## 2017-08-17 LAB — ECHOCARDIOGRAM COMPLETE
Height: 64 in
Weight: 1937.6 oz

## 2017-08-17 LAB — GLUCOSE, CAPILLARY
Glucose-Capillary: 116 mg/dL — ABNORMAL HIGH (ref 65–99)
Glucose-Capillary: 82 mg/dL (ref 65–99)

## 2017-08-17 LAB — TROPONIN I
Troponin I: <0.03 ng/mL (ref <0.03)
Troponin I: <0.03 ng/mL (ref <0.03)

## 2017-08-17 MED ORDER — ONDANSETRON HCL 4 MG PO TABS: 4.0000 mg | ORAL_TABLET | Freq: Four times a day (QID) | ORAL | Status: DC | PRN

## 2017-08-17 MED ORDER — SODIUM CHLORIDE 0.9 % IV SOLN
INTRAVENOUS | Status: DC
  Administered 2017-08-17 – 2017-08-19 (×3): via INTRAVENOUS

## 2017-08-17 MED ORDER — BISACODYL 5 MG PO TBEC: 5.0000 mg | DELAYED_RELEASE_TABLET | Freq: Every day | ORAL | Status: DC | PRN

## 2017-08-17 MED ORDER — ACETAMINOPHEN 650 MG RE SUPP: 650.0000 mg | Freq: Four times a day (QID) | RECTAL | Status: DC | PRN

## 2017-08-17 MED ORDER — ACETAMINOPHEN 325 MG PO TABS: 650.0000 mg | ORAL_TABLET | Freq: Four times a day (QID) | ORAL | Status: DC | PRN

## 2017-08-17 MED ORDER — SODIUM CHLORIDE 0.9 % IV SOLN
Freq: Once | INTRAVENOUS | Status: AC
  Administered 2017-08-17: via INTRAVENOUS

## 2017-08-17 MED ORDER — DOCUSATE SODIUM 100 MG PO CAPS
100.0000 mg | ORAL_CAPSULE | Freq: Two times a day (BID) | ORAL | Status: DC
  Administered 2017-08-17 – 2017-08-20 (×7): 100 mg via ORAL
  Filled 2017-08-17 (×7): qty 1

## 2017-08-17 MED ORDER — CIPROFLOXACIN IN D5W 400 MG/200ML IV SOLN
400.0000 mg | Freq: Two times a day (BID) | INTRAVENOUS | Status: DC
  Administered 2017-08-17 – 2017-08-19 (×5): 400 mg via INTRAVENOUS
  Filled 2017-08-17 (×5): qty 200

## 2017-08-17 MED ORDER — HYDROCODONE-ACETAMINOPHEN 5-325 MG PO TABS: 1.0000 | ORAL_TABLET | ORAL | Status: DC | PRN

## 2017-08-17 MED ORDER — TRAZODONE HCL 50 MG PO TABS: 25.0000 mg | ORAL_TABLET | Freq: Every evening | ORAL | Status: DC | PRN

## 2017-08-17 MED ORDER — HEPARIN SODIUM (PORCINE) 5000 UNIT/ML IJ SOLN
5000.0000 [IU] | Freq: Three times a day (TID) | INTRAMUSCULAR | Status: DC
  Administered 2017-08-17 – 2017-08-20 (×9): 5000 [IU] via SUBCUTANEOUS
  Filled 2017-08-17 (×9): qty 1

## 2017-08-17 MED ORDER — ONDANSETRON HCL 4 MG/2ML IJ SOLN: 4.0000 mg | Freq: Four times a day (QID) | INTRAMUSCULAR | Status: DC | PRN

## 2017-08-17 NOTE — Progress Notes (Signed)
Patient admitted from home for syncope. Says that she was recently treated for an UTI as outpatient- took doxycycline about a month ago. More alert now.  Carotids and echo Doppler done. -Urine cultures pending started on IV Cipro -Physical therapy consulted.  Sodium is low, getting IV fluids -For more details please look at the H&P -We will continue to monitor

## 2017-08-17 NOTE — H&P (Addendum)
[facility] [place] NAME: [name]    MR#:  [id number]  DATE OF BIRTH:  Mar 27, 1925  DATE OF ADMISSION:  08/16/2017  PRIMARY CARE PHYSICIAN: Chrismon, Vickki Muff, PA   REQUESTING/REFERRING PHYSICIAN:   CHIEF COMPLAINT:   Chief Complaint  Patient presents with  . Loss of Consciousness    HISTORY OF PRESENT ILLNESS: [name]  is a 84 y.o. female with a known history of hypertension, hyponatremia, diverticulitis. Patient is somewhat confused, poor historian.  Most of the information was taken from reviewing the medical records and from discussion with emergency room physician. Apparently, patient had a syncopal episode at home and she was "slow to come around", per family.  Per EMS, patient was found with low blood pressure at 70/40.  She seemed to be confused and slow to answer questions.  Patient herself, does not recall the syncopal episode and denies any complaints at this time. Blood test done emergency room are remarkable for low sodium level of 126.  UA is positive for UTI.  Troponin level is negative. EKG, by myself, is negative for any acute changes. Patient is admitted for further evaluation and treatment.  PAST MEDICAL HISTORY:   Past Medical History:  Diagnosis Date  . Breast cyst    removed  . Diverticulitis   . Gallstones   . Hypertension   . Low sodium levels     PAST SURGICAL HISTORY:  Past Surgical History:  Procedure Laterality Date  . ABDOMINAL HYSTERECTOMY  2000  . APPENDECTOMY  1940's  . bladder tack  1990's  . BREAST CYST INCISION AND DRAINAGE Left 1990's  . CHOLECYSTECTOMY  09/11/13   Cholangiogram suggested a retained stone ductal dilatation.  Marland Kitchen ERCP W/ SPHICTEROTOMY  09/12/2013   Recurrent stone identified post cholecystectomy.  Marland Kitchen NASAL SINUS SURGERY      SOCIAL HISTORY:  Social History   Tobacco Use  . Smoking status: Never Smoker  . Smokeless tobacco: Never Used  Substance Use Topics   . Alcohol use: No    FAMILY HISTORY:  Family History  Problem Relation Age of Onset  . Hypertension Father   . CAD Father   . Cancer Brother        unknown type    DRUG ALLERGIES:  Allergies  Allergen Reactions  . Accolate [Zafirlukast] Other (See Comments)    Reaction: unknown  . Bactrim [Sulfamethoxazole-Trimethoprim] Nausea And Vomiting  . Metronidazole Other (See Comments)    Reaction: unknown  . Nitrofurantoin Monohyd Macro Other (See Comments)    GI upset  . Penicillins Diarrhea and Other (See Comments)    Has patient had a PCN reaction causing immediate rash, facial/tongue/throat swelling, SOB or lightheadedness with hypotension: Unknown Has patient had a PCN reaction causing severe rash involving mucus membranes or skin necrosis: Unknown Has patient had a PCN reaction that required hospitalization: Unknown Has patient had a PCN reaction occurring within the last 10 years: Unknown If all of the above answers are "NO", then may proceed with Cephalosporin use.  Marland Kitchen Keflex [Cephalexin] Rash    REVIEW OF SYSTEMS:   Unable to obtain, due to patient being confused.  MEDICATIONS AT HOME:  Prior to Admission medications   Medication Sig Start Date End Date Taking? Authorizing Provider  acetaminophen (TYLENOL) 325 MG tablet Take 2 tablets (650 mg total) by mouth every 6 (six) hours as needed for mild pain (or Fever >/= 101). 07/23/15  Yes Nicholes Mango, MD  enalapril (VASOTEC) 10 MG tablet Take 1 tablet (10 mg total) by mouth daily. 07/20/17  Yes Chrismon, Vickki Muff, PA  feeding supplement (BOOST / RESOURCE BREEZE) LIQD Take 1 Container by mouth 3 (three) times daily between meals. 01/14/17  Yes Epifanio Lesches, MD  metoprolol tartrate (LOPRESSOR) 25 MG tablet Take 0.5 tablets (12.5 mg total) by mouth 2 (two) times daily. 07/20/17  Yes Chrismon, Vickki Muff, PA  traZODone (DESYREL) 100 MG tablet TAKE ONE TABLET BY MOUTH AT BEDTIME Patient taking differently: TAKE 1/2 TABLET (50mg )  BY MOUTH AT BEDTIME 04/14/17  Yes FisherKirstie Peri, MD      PHYSICAL EXAMINATION:   VITAL SIGNS: Blood pressure (!) 181/68, pulse 71, temperature 97.8 F (36.6 C), temperature source Oral, resp. rate 17, height 5\' 4"  (1.626 m), weight 54.9 kg (121 lb 1.6 oz), SpO2 99 %.  GENERAL:  84 y.o.-year-old patient lying in the bed with no acute distress.  EYES: Pupils equal, round, reactive to light and accommodation. No scleral icterus.  HEENT: Head atraumatic, normocephalic. Oropharynx and nasopharynx clear.  NECK:  Supple, no jugular venous distention. No thyroid enlargement, no tenderness.  LUNGS: Normal breath sounds bilaterally, no wheezing, rales,rhonchi or crepitation. No use of accessory muscles of respiration.  CARDIOVASCULAR: S1, S2 normal. No S3/S4.  ABDOMEN: Soft, nontender, nondistended. Bowel sounds present. No organomegaly or mass.  EXTREMITIES: No pedal edema, cyanosis, or clubbing.  NEUROLOGIC: No focal weakness. PSYCHIATRIC: The patient is alert, but confused.  SKIN: No obvious rash, lesion, or ulcer.   LABORATORY PANEL:   CBC Recent Labs  Lab 08/10/17 1344 08/16/17 2127 08/17/17 0405  WBC 6.3 8.2 10.9  HGB 11.8 12.7 12.9  HCT 35.2 36.3 36.8  PLT 182 194 189  MCV 95 92.6 93.3  MCH 31.9 32.3 32.6  MCHC 33.5 34.9 35.0  RDW 13.1 12.8 12.9  LYMPHSABS 1.7  --   --   EOSABS 0.1  --   --   BASOSABS 0.0  --   --    ------------------------------------------------------------------------------------------------------------------  Chemistries  Recent Labs  Lab 08/10/17 1344 08/16/17 2310 08/17/17 0405  NA 132* 126* 126*  K 4.1 4.1 4.0  CL 92* 93* 95*  CO2 24 25 23   GLUCOSE 80 94 89  BUN 17 23* 25*  CREATININE 0.81 1.00 0.98  CALCIUM 9.7 9.3 9.0  AST 29  --   --   ALT 14  --   --   ALKPHOS 111  --   --   BILITOT 0.3  --   --    ------------------------------------------------------------------------------------------------------------------ estimated  creatinine clearance is 32.9 mL/min (by C-G formula based on SCr of 0.98 mg/dL). ------------------------------------------------------------------------------------------------------------------ No results for input(s): TSH, T4TOTAL, T3FREE, THYROIDAB in the last 72 hours.  Invalid input(s): FREET3   Coagulation profile No results for input(s): INR, PROTIME in the last 168 hours. ------------------------------------------------------------------------------------------------------------------- No results for input(s): DDIMER in the last 72 hours. -------------------------------------------------------------------------------------------------------------------  Cardiac Enzymes Recent Labs  Lab 08/16/17 2310  TROPONINI <0.03   ------------------------------------------------------------------------------------------------------------------ Invalid input(s): POCBNP  ---------------------------------------------------------------------------------------------------------------  Urinalysis    Component Value Date/Time   COLORURINE YELLOW (A) 08/16/2017 2310   APPEARANCEUR HAZY (A) 08/16/2017 2310   APPEARANCEUR Clear 07/29/2015 1256   LABSPEC 1.011 08/16/2017 2310   LABSPEC 1.014 08/09/2013 2100   PHURINE 6.0 08/16/2017 [place] 08/16/2017 2310   GLUCOSEU Negative 08/09/2013 2100   HGBUR NEGATIVE 08/16/2017 2310   BILIRUBINUR NEGATIVE 08/16/2017 2310   BILIRUBINUR negative 08/16/2017 1439   BILIRUBINUR  Negative 07/29/2015 1256   BILIRUBINUR Negative 08/09/2013 2100   KETONESUR NEGATIVE 08/16/2017 2310   PROTEINUR NEGATIVE 08/16/2017 2310   UROBILINOGEN 0.2 08/16/2017 1439   NITRITE NEGATIVE 08/16/2017 2310   LEUKOCYTESUR LARGE (A) 08/16/2017 2310   LEUKOCYTESUR 1+ (A) 07/29/2015 1256   LEUKOCYTESUR Negative 08/09/2013 2100     RADIOLOGY: No results found.  EKG: Orders placed or performed during the hospital encounter of 08/16/17  . EKG 12-Lead  .  EKG 12-Lead  . ED EKG  . ED EKG    IMPRESSION AND PLAN:  1.  Syncope, of unclear etiology.  It could have been related to acute UTI and hyponatremia.  We will start patient on IV antibiotics and IV fluids.  We will continue to monitor on telemetry and follow troponin levels to rule out cardiac causes.  Will check 2D echo and carotid Doppler.  Continue to monitor clinically closely. 2.  Acute UTI.  We will start IV Cipro and IV fluids. 3.  Hypotension, likely secondary to poor p.o. Intake.  We will start gentle IV hydration and hold BP medications.  Continue to monitor closely. 4. Hyponatremia, in the setting of hypovolemia, from poor p.o. Intake.  We will start gentle IV hydration and continue to monitor sodium level closely.   All the records are reviewed and case discussed with ED provider. Management plans discussed with the patient, family and they are in agreement.  CODE STATUS: DNR    Code Status Orders  (From admission, onward)        Start     Ordered   08/17/17 0351  Do not attempt resuscitation (DNR)  Continuous    Question Answer Comment  In the event of cardiac or respiratory ARREST Do not call a "code blue"   In the event of cardiac or respiratory ARREST Do not perform Intubation, CPR, defibrillation or ACLS   In the event of cardiac or respiratory ARREST Use medication by any route, position, wound care, and other measures to relive pain and suffering. May use oxygen, suction and manual treatment of airway obstruction as needed for comfort.      08/17/17 0352    Code Status History    Date Active Date Inactive Code Status Order ID Comments User Context   01/11/2017 1646 01/14/2017 1515 DNR [id number]  Bettey Costa, MD Inpatient   01/11/2017 1635 01/11/2017 1646 Full Code [id number]  Bettey Costa, MD Inpatient   01/11/2017 1529 01/11/2017 1635 DNR [id number]  Bettey Costa, MD ED   07/21/2015 1650 07/23/2015 1642 Full Code [id number]  Hower, Aaron Mose, MD ED    Advance Directive  Documentation     Most Recent Value  Type of Advance Directive  Healthcare Power of [place], Living will  Pre-existing out of facility DNR order (yellow form or pink MOST form)  -  "MOST" Form in Place?  -       TOTAL TIME TAKING CARE OF THIS PATIENT: 45 minutes.    Amelia Jo M.D on 08/17/2017 at 5:25 AM  Between 7am to 6pm - Pager - [phone]  After 6pm go to [email or web address] - password [email or web address]  Office  [phone]  CC: Primary care physician; Margo Common, PA

## 2017-08-17 NOTE — Evaluation (Signed)
Physical Therapy Evaluation Patient Details Name: [name] MRN: [id number] DOB: [birth year] Today's Date: 08/17/2017   History of Present Illness  Pt is a 84 y.o. female presenting to hospital 08/16/17 with syncope at home (BP noted to be 70/40); pt admitted with syncope, acute UTI, and hypotension.  PMH includes htn.  Clinical Impression  Prior to hospital admission, pt was modified independent ambulating with RW.  Pt lives with her son (who has a BKA and AKA) in 1 level home with ramp to enter.  Currently pt is SBA supine to sit; CGA with transfers; and CGA ambulating 40 feet with RW.  Pt appearing with decreased mood and requiring extra time for all activities but improved mood noted during session.  Overall pt demonstrating generalized weakness but was steady with use of RW during session.  Pt would benefit from skilled PT to address noted impairments and functional limitations (see below for any additional details).  Upon hospital discharge, recommend pt discharge to home with HHPT.    Follow Up Recommendations Home health PT    Equipment Recommendations  Rolling walker with 5" wheels    Recommendations for Other Services       Precautions / Restrictions Precautions Precautions: Fall Restrictions Weight Bearing Restrictions: No      Mobility  Bed Mobility Overal bed mobility: Needs Assistance Bed Mobility: Supine to Sit     Supine to sit: Supervision;HOB elevated     General bed mobility comments: increased effort and time to perform on own  Transfers Overall transfer level: Needs assistance Equipment used: Rolling walker (2 wheeled) Transfers: Sit to/from [facility] Sit to Stand: Min guard Stand pivot transfers: Min guard       General transfer comment: increased effort to stand from bed, recliner, and BSC using RW  Ambulation/Gait Ambulation/Gait assistance: Min guard Ambulation Distance (Feet): 40 Feet Assistive device: Rolling walker (2  wheeled)   Gait velocity: decreased   General Gait Details: decreased B step length/foot clearance/heelstrike  Stairs            Wheelchair Mobility    Modified Rankin (Stroke Patients Only)       Balance Overall balance assessment: Needs assistance Sitting-balance support: No upper extremity supported;Feet supported Sitting balance-Leahy Scale: Good Sitting balance - Comments: steady sitting reaching within BOS   Standing balance support: Single extremity supported Standing balance-Leahy Scale: Poor Standing balance comment: requires at least single UE support for static standing balance                             Pertinent Vitals/Pain Pain Assessment: No/denies pain  Vitals (HR and O2 on room air) stable and WFL throughout treatment session.    Home Living Family/patient expects to be discharged to:: Private residence Living Arrangements: Children(Pt's son (pt reports he is a "double amputee")) Available Help at Discharge: Family Type of Home: House Home Access: Ramped entrance     Home Layout: One level Home Equipment: Environmental consultant - 2 wheels;Cane - single point;Shower seat;Grab bars - tub/shower      Prior Function Level of Independence: Independent with assistive device(s)   Gait / Transfers Assistance Needed: Ambulates with RW; pt reports h/o 1 fall (other than recent syncope) but unsure how long ago.  ADL's / Homemaking Assistance Needed: Pt's son does the meal preparation.        Hand Dominance        Extremity/Trunk Assessment   Upper Extremity  Assessment Upper Extremity Assessment: Generalized weakness    Lower Extremity Assessment Lower Extremity Assessment: Generalized weakness    Cervical / Trunk Assessment Cervical / Trunk Assessment: Normal  Communication   Communication: No difficulties  Cognition Arousal/Alertness: Awake/alert Behavior During Therapy: (decreased mood noted (improved mood noted during session)) Overall  Cognitive Status: Within Functional Limits for tasks assessed                                        General Comments General comments (skin integrity, edema, etc.): Pt resting in bed upon PT entry.  Bruise noted to pt's R lateral hip area.  Nursing cleared pt for participation in physical therapy.  Pt agreeable to PT session and requesting to toilet during session.  Purewick removed beginning of session and pt requesting to try without Purewick end of session; NT and nursing notified.    Exercises     Assessment/Plan    PT Assessment Patient needs continued PT services  PT Problem List Decreased strength;Decreased activity tolerance;Decreased balance;Decreased mobility       PT Treatment Interventions DME instruction;Gait training;Stair training;Functional mobility training;Therapeutic activities;Therapeutic exercise;Balance training;Patient/family education    PT Goals (Current goals can be found in the Care Plan section)  Acute Rehab PT Goals Patient Stated Goal: to go home PT Goal Formulation: With patient Time For Goal Achievement: 08/31/17 Potential to Achieve Goals: Good    Frequency Min 2X/week   Barriers to discharge        Co-evaluation               AM-PAC PT "6 Clicks" Daily Activity  Outcome Measure Difficulty turning over in bed (including adjusting bedclothes, sheets and blankets)?: A Little Difficulty moving from lying on back to sitting on the side of the bed? : A Lot Difficulty sitting down on and standing up from a chair with arms (e.g., wheelchair, bedside commode, etc,.)?: Unable Help needed moving to and from a bed to chair (including a wheelchair)?: A Little Help needed walking in hospital room?: A Little Help needed climbing 3-5 steps with a railing? : A Little 6 Click Score: 15    End of Session Equipment Utilized During Treatment: Gait belt Activity Tolerance: Patient tolerated treatment well Patient left: in chair;with  call bell/phone within reach;with chair alarm set Nurse Communication: Mobility status;Precautions PT Visit Diagnosis: Other abnormalities of gait and mobility (R26.89);Muscle weakness (generalized) (M62.81);History of falling (Z91.81);Difficulty in walking, not elsewhere classified (R26.2)    Time: 1351-1450 PT Time Calculation (min) (ACUTE ONLY): 59 min   Charges:   PT Evaluation $PT Eval Low Complexity: 1 Low PT Treatments $Therapeutic Activity: 23-37 mins   PT G CodesLeitha Bleak, PT 08/17/17, 3:14 PM [phone]

## 2017-08-17 NOTE — Progress Notes (Signed)
*  PRELIMINARY RESULTS* Echocardiogram 2D Echocardiogram has been performed.  [place] 08/17/2017, 9:23 AM

## 2017-08-18 LAB — BASIC METABOLIC PANEL
Anion gap: 7 (ref 5–15)
BUN: 20 mg/dL (ref 6–20)
CO2: 22 mmol/L (ref 22–32)
Calcium: 8.9 mg/dL (ref 8.9–10.3)
Chloride: 99 mmol/L — ABNORMAL LOW (ref 101–111)
Creatinine, Ser: 0.8 mg/dL (ref 0.44–1.00)
GFR calc Af Amer: >60 mL/min (ref >60)
GFR calc non Af Amer: >60 mL/min (ref >60)
Glucose, Bld: 119 mg/dL — ABNORMAL HIGH (ref 65–99)
Potassium: 3.9 mmol/L (ref 3.5–5.1)
Sodium: 128 mmol/L — ABNORMAL LOW (ref 135–145)

## 2017-08-18 LAB — URINE CULTURE

## 2017-08-18 LAB — GLUCOSE, CAPILLARY: Glucose-Capillary: 117 mg/dL — ABNORMAL HIGH (ref 65–99)

## 2017-08-18 MED ORDER — ENALAPRIL MALEATE 10 MG PO TABS
10.0000 mg | ORAL_TABLET | Freq: Every day | ORAL | Status: DC
  Administered 2017-08-18 – 2017-08-20 (×3): 10 mg via ORAL
  Filled 2017-08-18 (×3): qty 1

## 2017-08-18 NOTE — Care Management Note (Signed)
Case Management Note  Patient Details  Name: [name] MRN: [id number] Date of Birth: Jan 21, 1925  Subjective/Objective:                  Admitted to [facility] with the diagnosis of syncope. Son, [name], lives in the home [phone]). Last seen Dr. Freddi Starr 08/16/17. Prescriptions are filled at [facility] [place] in the past. No skilled nursing. No home oxygen. Rolling Walker in the home. [facility] in the past. Takes care of all basic activities of daily living herself, doesn't drive anymore  Son's friend helps with errands.  [place] one time in the last month. Fair appetite.   Action/Plan: Physical therapy evaluation completed. Recommending home with home health in the home. Would like [facility] again.   Expected Discharge Date:                  Expected Discharge Plan:     In-House Referral:   yes  Discharge planning Services   yes  Post Acute Care Choice:   yes Choice offered to:   Ms. [name]  DME Arranged:    DME Agency:     [facility] Arranged:   yes, will  Be arranged   [place] Agency:   [facility]   Status of Service:     If discussed at [place] of Stay Meetings, dates discussed:    Additional Comments:  Shelbie Ammons, RN MSN CCM Care Management [phone] 08/18/2017, 8:31 AM

## 2017-08-18 NOTE — Progress Notes (Signed)
[place] [facility] [place] NAME: [name]    MR#:  [id number]  DATE OF BIRTH:  1924/05/26  SUBJECTIVE:  CHIEF COMPLAINT:   Chief Complaint  Patient presents with  . Loss of Consciousness   -Upset about a lot of things including the IV in her right arm, water jug being placed away from her table. -Sodium at 128 this morning  REVIEW OF SYSTEMS:  Review of Systems  Constitutional: Positive for malaise/fatigue. Negative for chills and fever.  HENT: Negative for congestion, ear discharge, hearing loss and nosebleeds.   Eyes: Negative for blurred vision and double vision.  Respiratory: Negative for cough, shortness of breath and wheezing.   Cardiovascular: Negative for chest pain and palpitations.  Gastrointestinal: Negative for abdominal pain, constipation, diarrhea, nausea and vomiting.  Genitourinary: Negative for dysuria.  Neurological: Positive for weakness. Negative for dizziness, seizures and headaches.    DRUG ALLERGIES:   Allergies  Allergen Reactions  . Accolate [Zafirlukast] Other (See Comments)    Reaction: unknown  . Bactrim [Sulfamethoxazole-Trimethoprim] Nausea And Vomiting  . Metronidazole Other (See Comments)    Reaction: unknown  . Nitrofurantoin Monohyd Macro Other (See Comments)    GI upset  . Penicillins Diarrhea and Other (See Comments)    Has patient had a PCN reaction causing immediate rash, facial/tongue/throat swelling, SOB or lightheadedness with hypotension: Unknown Has patient had a PCN reaction causing severe rash involving mucus membranes or skin necrosis: Unknown Has patient had a PCN reaction that required hospitalization: Unknown Has patient had a PCN reaction occurring within the last 10 years: Unknown If all of the above answers are "NO", then may proceed with Cephalosporin use.  Marland Kitchen Keflex [Cephalexin] Rash    VITALS:  Blood pressure (!) 173/70, pulse 83, temperature 97.9 F (36.6 C), temperature  source Oral, resp. rate (!) 21, height 5\' 4"  (1.626 m), weight 55.1 kg (121 lb 8 oz), SpO2 96 %.  PHYSICAL EXAMINATION:  Physical Exam  GENERAL:  84 y.o.-year-old elderly patient lying in the bed with no acute distress.  EYES: Pupils equal, round, reactive to light and accommodation. No scleral icterus. Extraocular muscles intact.  HEENT: Head atraumatic, normocephalic. Oropharynx and nasopharynx clear.  NECK:  Supple, no jugular venous distention. No thyroid enlargement, no tenderness.  LUNGS: Normal breath sounds bilaterally, no wheezing, rales,rhonchi or crepitation. No use of accessory muscles of respiration.  Decreased bibasilar breath sounds CARDIOVASCULAR: S1, S2 normal. No  rubs, or gallops.  2/6 systolic murmur is present ABDOMEN: Soft, nontender, nondistended. Bowel sounds present. No organomegaly or mass.  EXTREMITIES: No pedal edema, cyanosis, or clubbing.  NEUROLOGIC: Cranial nerves II through XII are intact. Muscle strength 5/5 in all extremities. Sensation intact. Gait not checked.  Global weakness noted PSYCHIATRIC: The patient is alert and oriented x 3.  SKIN: No obvious rash, lesion, or ulcer.    LABORATORY PANEL:   CBC Recent Labs  Lab 08/17/17 0405  WBC 10.9  HGB 12.9  HCT 36.8  PLT 189   ------------------------------------------------------------------------------------------------------------------  Chemistries  Recent Labs  Lab 08/18/17 0357  NA 128*  K 3.9  CL 99*  CO2 22  GLUCOSE 119*  BUN 20  CREATININE 0.80  CALCIUM 8.9   ------------------------------------------------------------------------------------------------------------------  Cardiac Enzymes Recent Labs  Lab 08/17/17 0405  TROPONINI <0.03   ------------------------------------------------------------------------------------------------------------------  RADIOLOGY:  US Carotid Bilateral  Result Date: 08/17/2017 CLINICAL DATA:  Syncope, hypertension. EXAM: BILATERAL CAROTID  DUPLEX ULTRASOUND TECHNIQUE: Pearline Cables scale imaging, color  Doppler and duplex ultrasound were performed of bilateral carotid and vertebral arteries in the neck. COMPARISON:  None. FINDINGS: Criteria: Quantification of carotid stenosis is based on velocity parameters that correlate the residual internal carotid diameter with NASCET-based stenosis levels, using the diameter of the distal internal carotid lumen as the denominator for stenosis measurement. The following velocity measurements were obtained: RIGHT ICA:  90/14 cm/sec CCA:  03/5 cm/sec SYSTOLIC ICA/CCA RATIO:  1.1 ECA:  65 cm/sec LEFT ICA:  95/8 cm/sec CCA:  59/7 cm/sec SYSTOLIC ICA/CCA RATIO:  1.2 ECA:  91 cm/sec RIGHT CAROTID ARTERY: The common carotid artery demonstrates intimal thickening. There is a mild amount of noncalcified plaque at the level of the carotid bulb. Mild amount of partially calcified plaque is present in the proximal right ICA. Estimated right ICA stenosis is less than 50%. RIGHT VERTEBRAL ARTERY: Antegrade flow with normal waveform and velocity. LEFT CAROTID ARTERY: Mild amount partially calcified plaque at the level of the carotid bulb proximal ICA. Estimated left ICA stenosis is less than 50%. LEFT VERTEBRAL ARTERY: Antegrade flow with normal waveform and velocity. IMPRESSION: Mild amount plaque at the level of both carotid bulbs and proximal internal carotid arteries. Estimated bilateral ICA stenoses are less than 50%. Electronically Signed   By: Aletta Edouard M.D.   On: 08/17/2017 09:25    EKG:   Orders placed or performed during the hospital encounter of 08/16/17  . EKG 12-Lead  . EKG 12-Lead  . ED EKG  . ED EKG    ASSESSMENT AND PLAN:   84 year old female with past medical history significant for hypertension, diverticulitis presents to hospital secondary to confusion and presyncope  1.  Acute metabolic encephalopathy-secondary to hypotension and hyponatremia. -Concern for UTI as well. -Mental status is much  improved and back to baseline -Carotid Dopplers done as part of syncope work-up were negative for any hemodynamically significant stenosis -Echocardiogram with normal ejection fraction, EF of 41% and mild diastolic dysfunction  2.  Hyponatremia-initially thought to be hypovolemic hyponatremia -Receiving IV fluids and also drinking plenty of fluids.  Urine is very light and clear -Only 2 L of fluid per day, receiving IV fluids as well -Monitor sodium in a.m.  3.  UTI-urine cultures growing mixed organisms.  Currently on ciprofloxacin. -Stop after 5 days  4.  DVT prophylaxis-subcutaneous heparin   Physical therapy recommended home health.  Encourage ambulation.  Possible discharge tomorrow if sodium is improved   All the records are reviewed and case discussed with Care Management/Social Workerr. Management plans discussed with the patient, family and they are in agreement.  CODE STATUS: Full code  TOTAL TIME TAKING CARE OF THIS PATIENT: 37 minutes.   POSSIBLE D/C IN 1-2 DAYS, DEPENDING ON CLINICAL CONDITION.   Gladstone Lighter M.D on 08/18/2017 at 12:08 PM  Between 7am to 6pm - Pager - [phone]  After 6pm go to [email or web address] - password [email or web address] [place] [facility]  Office  [phone]  CC: Primary care physician; Chrismon, Vickki Muff, PA

## 2017-08-19 LAB — BASIC METABOLIC PANEL
Anion gap: 7 (ref 5–15)
Anion gap: 7 (ref 5–15)
BUN: 19 mg/dL (ref 6–20)
BUN: 23 mg/dL — ABNORMAL HIGH (ref 6–20)
CO2: 20 mmol/L — ABNORMAL LOW (ref 22–32)
CO2: 21 mmol/L — ABNORMAL LOW (ref 22–32)
Calcium: 8.2 mg/dL — ABNORMAL LOW (ref 8.9–10.3)
Calcium: 8.4 mg/dL — ABNORMAL LOW (ref 8.9–10.3)
Chloride: 98 mmol/L — ABNORMAL LOW (ref 101–111)
Chloride: 95 mmol/L — ABNORMAL LOW (ref 101–111)
Creatinine, Ser: 0.82 mg/dL (ref 0.44–1.00)
Creatinine, Ser: 0.95 mg/dL (ref 0.44–1.00)
GFR calc Af Amer: >60 mL/min (ref >60)
GFR calc Af Amer: 59 mL/min — ABNORMAL LOW (ref >60)
GFR calc non Af Amer: >60 mL/min (ref >60)
GFR calc non Af Amer: 51 mL/min — ABNORMAL LOW (ref >60)
Glucose, Bld: 94 mg/dL (ref 65–99)
Glucose, Bld: 117 mg/dL — ABNORMAL HIGH (ref 65–99)
Potassium: 3.6 mmol/L (ref 3.5–5.1)
Potassium: 3.8 mmol/L (ref 3.5–5.1)
Sodium: 125 mmol/L — ABNORMAL LOW (ref 135–145)
Sodium: 123 mmol/L — ABNORMAL LOW (ref 135–145)

## 2017-08-19 LAB — CBC
HCT: 32.4 % — ABNORMAL LOW (ref 35.0–47.0)
Hemoglobin: 11.5 g/dL — ABNORMAL LOW (ref 12.0–16.0)
MCH: 33.3 pg (ref 26.0–34.0)
MCHC: 35.6 g/dL (ref 32.0–36.0)
MCV: 93.6 fL (ref 80.0–100.0)
Platelets: 154 10*3/uL (ref 150–440)
RBC: 3.46 MIL/uL — ABNORMAL LOW (ref 3.80–5.20)
RDW: 12.8 % (ref 11.5–14.5)
WBC: 9.5 10*3/uL (ref 3.6–11.0)

## 2017-08-19 LAB — OSMOLALITY, URINE: Osmolality, Ur: 365 mOsm/kg (ref 300–900)

## 2017-08-19 LAB — SODIUM: Sodium: 127 mmol/L — ABNORMAL LOW (ref 135–145)

## 2017-08-19 MED ORDER — TOLVAPTAN 15 MG PO TABS
15.0000 mg | ORAL_TABLET | ORAL | Status: DC
  Administered 2017-08-19: 17:00:00 15 mg via ORAL
  Filled 2017-08-19: qty 1

## 2017-08-19 MED ORDER — CIPROFLOXACIN HCL 500 MG PO TABS
500.0000 mg | ORAL_TABLET | Freq: Two times a day (BID) | ORAL | Status: DC
  Administered 2017-08-19 – 2017-08-20 (×2): 500 mg via ORAL
  Filled 2017-08-19 (×2): qty 1

## 2017-08-19 NOTE — Progress Notes (Signed)
[facility]  ROUNDING NOTE   Subjective:  Patient known to [place] from the office as well as prior hospitalization. She was seen by my partner Dr. Candiss Norse on 03/25/17. At that time her serum sodium was 135. She presents now with recurrent hyponatremia and UTI. Patient was given IV fluid hydration however serum sodium has now dropped again. This suggests an element of SIADH.   Objective:  Vital signs in last 24 hours:  Temp:  [98.1 F (36.7 C)-100 F (37.8 C)] 98.1 F (36.7 C) (05/09 1451) Pulse Rate:  [75-99] 85 (05/09 1451) Resp:  [18] 18 (05/09 0309) BP: (122-166)/(44-75) 132/48 (05/09 1451) SpO2:  [96 %-98 %] 98 % (05/09 1451)  Weight change:  Filed Weights   08/16/17 2128 08/17/17 0350 08/18/17 0408  Weight: 54 kg (119 lb) 54.9 kg (121 lb 1.6 oz) 55.1 kg (121 lb 8 oz)    Intake/Output: I/O last 3 completed shifts: In: 5117.5 [P.O.:720; I.V.:3597.5; IV Piggyback:800] Out: 3850 [Urine:3850]   Intake/Output this shift:  Total I/O In: 600 [P.O.:600] Out: -   Physical Exam: General: No acute distress  Head: Normocephalic, atraumatic. Moist oral mucosal membranes  Eyes: Anicteric  Neck: Supple, trachea midline  Lungs:  Clear to auscultation, normal effort  Heart: S1S2 no rubs  Abdomen:  Soft, nontender, bowel sounds present  Extremities: Trace peripheral edema.  Neurologic: Awake, alert, following commands  Skin: No lesions       Basic Metabolic Panel: Recent Labs  Lab 08/16/17 2310 08/17/17 0405 08/18/17 0357 08/19/17 0750  NA 126* 126* 128* 125*  K 4.1 4.0 3.9 3.6  CL 93* 95* 99* 98*  CO2 25 23 22  20*  GLUCOSE 94 89 119* 94  BUN 23* 25* 20 19  CREATININE 1.00 0.98 0.80 0.82  CALCIUM 9.3 9.0 8.9 8.2*    Liver Function Tests: No results for input(s): AST, ALT, ALKPHOS, BILITOT, PROT, ALBUMIN in the last 168 hours. No results for input(s): LIPASE, AMYLASE in the last 168 hours. No results for input(s): AMMONIA in the last 168  hours.  CBC: Recent Labs  Lab 08/16/17 2127 08/17/17 0405 08/19/17 0750  WBC 8.2 10.9 9.5  HGB 12.7 12.9 11.5*  HCT 36.3 36.8 32.4*  MCV 92.6 93.3 93.6  PLT 194 189 154    Cardiac Enzymes: Recent Labs  Lab 08/16/17 2310 08/17/17 0405  TROPONINI <0.03 <0.03    BNP: Invalid input(s): POCBNP  CBG: Recent Labs  Lab 08/17/17 0620 08/17/17 0736 08/18/17 0847  GLUCAP 116* 82 117*    Microbiology: Results for orders placed or performed during the hospital encounter of 08/16/17  Urine Culture     Status: Abnormal   Collection Time: 08/16/17 11:10 PM  Result Value Ref Range Status   Specimen Description   Final    URINE, RANDOM Performed at [facility], [address] [place], [place] [address]    Special Requests   Final    NONE Performed at [facility], [address] [place], [place] [address]    Culture MULTIPLE SPECIES PRESENT, SUGGEST RECOLLECTION (A)  Final   Report Status 08/18/2017 FINAL  Final    Coagulation Studies: No results for input(s): LABPROT, INR in the last 72 hours.  Urinalysis: Recent Labs    08/16/17 2310  COLORURINE YELLOW*  LABSPEC 1.011  PHURINE 6.0  GLUCOSEU NEGATIVE  HGBUR NEGATIVE  BILIRUBINUR NEGATIVE  KETONESUR NEGATIVE  PROTEINUR NEGATIVE  NITRITE NEGATIVE  LEUKOCYTESUR LARGE*      Imaging:  No results found.   Medications:    . ciprofloxacin  500 mg Oral BID  . docusate sodium  100 mg Oral BID  . enalapril  10 mg Oral Daily  . heparin  5,000 Units Subcutaneous Q8H   acetaminophen **OR** acetaminophen, bisacodyl, HYDROcodone-acetaminophen, ondansetron **OR** ondansetron (ZOFRAN) IV, traZODone  Assessment/ Plan:  84 y.o. female with hypertension, recurrent hyponatremia, prior history of UTI.    1.  Hyponatremia.  Patient appears to have recurrent hyponatremia.  erum sodium was found to be 126 upon admission and now down to 125.  She was provided with IV fluid hydration.  This suggests  some element of underlying SIADH.  Therefore we will administer one dose of ADH antagonist, tolvaptan 15mg  po x one.  We will monitor serum sodium closely thereafter.  2.  Hypertension.  Maintain the patient on enalapril 10 mg by mouth daily.     LOS: 2 [name], [name] 5/9/20193:21 PM

## 2017-08-19 NOTE — Progress Notes (Signed)
PHARMACIST - PHYSICIAN COMMUNICATION DR:   Tressia Miners CONCERNING: Antibiotic IV to Oral Route Change Policy  RECOMMENDATION: This patient is receiving ciprofloxacin by the intravenous route.  Based on criteria approved by the Pharmacy and Therapeutics Committee, the antibiotic(s) is/are being converted to the equivalent oral dose form(s).   DESCRIPTION: These criteria include:  Patient being treated for a respiratory tract infection, urinary tract infection, cellulitis or clostridium difficile associated diarrhea if on metronidazole  The patient is not neutropenic and does not exhibit a GI malabsorption state  The patient is eating (either orally or via tube) and/or has been taking other orally administered medications for a least 24 hours  The patient is improving clinically and has a Tmax < 100.5  If you have questions about this conversion, please contact the Pharmacy Department  []   [phone] )  [facility] [x]   [phone] )  [facility] []   [phone] )  [facility] []   [phone] )  [facility] []   [phone] )  [facility]

## 2017-08-19 NOTE — Progress Notes (Signed)
[place] [place] NAME: Rubby Barbary    MR#:  [id number]  DATE OF BIRTH:  04-07-1925  SUBJECTIVE:  CHIEF COMPLAINT:   Chief Complaint  Patient presents with  . Loss of Consciousness   -Continues to feel weak.  Sodium low again at 125 -States that she has chronic hyponatremia  REVIEW OF SYSTEMS:  Review of Systems  Constitutional: Positive for malaise/fatigue. Negative for chills and fever.  HENT: Negative for congestion, ear discharge, hearing loss and nosebleeds.   Eyes: Negative for blurred vision and double vision.  Respiratory: Negative for cough, shortness of breath and wheezing.   Cardiovascular: Negative for chest pain and palpitations.  Gastrointestinal: Negative for abdominal pain, constipation, diarrhea, nausea and vomiting.  Genitourinary: Negative for dysuria.  Neurological: Positive for weakness. Negative for dizziness, seizures and headaches.    DRUG ALLERGIES:   Allergies  Allergen Reactions  . Accolate [Zafirlukast] Other (See Comments)    Reaction: unknown  . Bactrim [Sulfamethoxazole-Trimethoprim] Nausea And Vomiting  . Metronidazole Other (See Comments)    Reaction: unknown  . Nitrofurantoin Monohyd Macro Other (See Comments)    GI upset  . Penicillins Diarrhea and Other (See Comments)    Has patient had a PCN reaction causing immediate rash, facial/tongue/throat swelling, SOB or lightheadedness with hypotension: Unknown Has patient had a PCN reaction causing severe rash involving mucus membranes or skin necrosis: Unknown Has patient had a PCN reaction that required hospitalization: Unknown Has patient had a PCN reaction occurring within the last 10 years: Unknown If all of the above answers are "NO", then may proceed with Cephalosporin use.  Marland Kitchen Keflex [Cephalexin] Rash    VITALS:  Blood pressure (!) 122/44, pulse 75, temperature 100 F (37.8 C), temperature source Oral, resp. rate 18, height 5\' 4"   (1.626 m), weight 55.1 kg (121 lb 8 oz), SpO2 96 %.  PHYSICAL EXAMINATION:  Physical Exam  GENERAL:  84 y.o.-year-old elderly patient lying in the bed with no acute distress.  EYES: Pupils equal, round, reactive to light and accommodation. No scleral icterus. Extraocular muscles intact.  HEENT: Head atraumatic, normocephalic. Oropharynx and nasopharynx clear.  NECK:  Supple, no jugular venous distention. No thyroid enlargement, no tenderness.  LUNGS: Normal breath sounds bilaterally, no wheezing, rales,rhonchi or crepitation. No use of accessory muscles of respiration.  Decreased bibasilar breath sounds CARDIOVASCULAR: S1, S2 normal. No  rubs, or gallops.  2/6 systolic murmur is present ABDOMEN: Soft, nontender, nondistended. Bowel sounds present. No organomegaly or mass.  EXTREMITIES: No pedal edema, cyanosis, or clubbing.  NEUROLOGIC: Cranial nerves II through XII are intact. Muscle strength 5/5 in all extremities. Sensation intact. Gait not checked.  Global weakness noted PSYCHIATRIC: The patient is alert and oriented x 3.  SKIN: No obvious rash, lesion, or ulcer.    LABORATORY PANEL:   CBC Recent Labs  Lab 08/19/17 0750  WBC 9.5  HGB 11.5*  HCT 32.4*  PLT 154   ------------------------------------------------------------------------------------------------------------------  Chemistries  Recent Labs  Lab 08/19/17 0750  NA 125*  K 3.6  CL 98*  CO2 20*  GLUCOSE 94  BUN 19  CREATININE 0.82  CALCIUM 8.2*   ------------------------------------------------------------------------------------------------------------------  Cardiac Enzymes Recent Labs  Lab 08/17/17 0405  TROPONINI <0.03   ------------------------------------------------------------------------------------------------------------------  RADIOLOGY:  No results found.  EKG:   Orders placed or performed during the hospital encounter of 08/16/17  . EKG 12-Lead  . EKG 12-Lead  . ED EKG  . ED EKG  ASSESSMENT AND PLAN:   84 year old female with past medical history significant for hypertension, diverticulitis presents to hospital secondary to confusion and presyncope  1.  Acute metabolic encephalopathy-secondary to hypotension and hyponatremia. -Concern for UTI as well. -Mental status is improved and back to baseline -Carotid Dopplers done as part of syncope work-up were negative for any hemodynamically significant stenosis -Echocardiogram with normal ejection fraction, EF of 95% and mild diastolic dysfunction  2.  Hyponatremia-acute on chronic hyponatremia -Received IV fluids in the hospital for more than 48 hours.  Sodium actually dropped.  Will discontinue fluids today - nephrology consulted. Urine sodium and osm levels ordered  3.  UTI-urine cultures growing mixed organisms.  Currently on ciprofloxacin. -Stop after 5 days  4.  DVT prophylaxis-subcutaneous heparin   Physical therapy recommended home health.  Encourage ambulation.     All the records are reviewed and case discussed with Care Management/Social Workerr. Management plans discussed with the patient, family and they are in agreement.  CODE STATUS: Full code  TOTAL TIME TAKING CARE OF THIS PATIENT: 34 minutes.   POSSIBLE D/C IN 1-2 DAYS, DEPENDING ON CLINICAL CONDITION.   Gladstone Lighter M.D on 08/19/2017 at 1:45 PM  Between 7am to 6pm - Pager - [phone]  After 6pm go to [email or web address] - password [email or web address] [facility]  Office  [phone]  CC: Primary care physician; Chrismon, Vickki Muff, PA

## 2017-08-19 NOTE — Care Management (Signed)
Joelene Millin at [facility] states that they are unable to take [facility]. Referral to Gay Filler at [facility] RN MSN Lillie Management [phone]

## 2017-08-19 NOTE — Progress Notes (Signed)
Physical Therapy Treatment Patient Details Name: WILLIAM LASKE MRN: [id number] DOB: 30-Oct-1924 Today's Date: 08/19/2017    History of Present Illness Pt is a 84 y.o. female presenting to hospital 08/16/17 with syncope at home (BP noted to be 70/40); pt admitted with syncope, acute UTI, and hypotension.  PMH includes htn.    PT Comments    Pt in bed, agrees to get up and walk.  Increased time and effort to get to edge of bed.  Rails used.  Stood with min guard/assist and transferred to commode at bedside.  Assist needed for care.  She then continued to ambulate with walker and min guard 120'.  Fatigue noted upon return to room and some increase assist needed but overall no LOB noted.  Remained in recliner for breakfast.   Follow Up Recommendations  Home health PT, assist with mobility     Equipment Recommendations  Rolling walker with 5" wheels    Recommendations for Other Services       Precautions / Restrictions Precautions Precautions: Fall Restrictions Weight Bearing Restrictions: No    Mobility  Bed Mobility Overal bed mobility: Needs Assistance Bed Mobility: Supine to Sit     Supine to sit: Supervision;HOB elevated     General bed mobility comments: increased effort and time to perform on own  Transfers Overall transfer level: Needs assistance Equipment used: Rolling walker (2 wheeled) Transfers: Sit to/from [facility] Sit to Stand: Min guard;Min assist         General transfer comment: increased effort to stand from bed, recliner, and BSC using RW  Ambulation/Gait Ambulation/Gait assistance: Min guard;Min assist Ambulation Distance (Feet): 120 Feet Assistive device: Rolling walker (2 wheeled)   Gait velocity: decreased Gait velocity interpretation: <1.8 ft/sec, indicate of risk for recurrent falls General Gait Details: decreased B step length/foot clearance/heelstrike, increased fatigued with distance requiring some increased  assist.   Stairs             Wheelchair Mobility    Modified Rankin (Stroke Patients Only)       Balance Overall balance assessment: Needs assistance Sitting-balance support: No upper extremity supported;Feet supported Sitting balance-Leahy Scale: Good     Standing balance support: Single extremity supported Standing balance-Leahy Scale: Poor Standing balance comment: requires at least single UE support for static standing balance                            Cognition Arousal/Alertness: Awake/alert Behavior During Therapy: WFL for tasks assessed/performed Overall Cognitive Status: Within Functional Limits for tasks assessed                                        Exercises Other Exercises Other Exercises: to commode with assist for care    General Comments        Pertinent Vitals/Pain Pain Assessment: No/denies pain    Home Living                      Prior Function            PT Goals (current goals can now be found in the care plan section) Progress towards PT goals: Progressing toward goals    Frequency    Min 2X/week      PT Plan Current plan remains appropriate    Co-evaluation  AM-PAC PT "6 Clicks" Daily Activity  Outcome Measure  Difficulty turning over in bed (including adjusting bedclothes, sheets and blankets)?: None Difficulty moving from lying on back to sitting on the side of the bed? : None Difficulty sitting down on and standing up from a chair with arms (e.g., wheelchair, bedside commode, etc,.)?: Unable Help needed moving to and from a bed to chair (including a wheelchair)?: A Little Help needed walking in hospital room?: A Little Help needed climbing 3-5 steps with a railing? : A Little 6 Click Score: 18    End of Session Equipment Utilized During Treatment: Gait belt Activity Tolerance: Patient tolerated treatment well Patient left: in chair;with call bell/phone  within reach;with chair alarm set         Time: [phone] PT Time Calculation (min) (ACUTE ONLY): 20 min  Charges:  $Gait Training: 8-22 mins                    G Codes:       Steele Ledonne, PTA 08/19/17, 10:42 AM

## 2017-08-20 LAB — SODIUM: Sodium: 132 mmol/L — ABNORMAL LOW (ref 135–145)

## 2017-08-20 LAB — GLUCOSE, CAPILLARY: Glucose-Capillary: 93 mg/dL (ref 65–99)

## 2017-08-20 LAB — OSMOLALITY: Osmolality: 275 mOsm/kg (ref 275–295)

## 2017-08-20 MED ORDER — TRAZODONE HCL 100 MG PO TABS: ORAL_TABLET | ORAL | 2 refills | Status: DC

## 2017-08-20 MED ORDER — CIPROFLOXACIN HCL 500 MG PO TABS: 500.0000 mg | ORAL_TABLET | Freq: Two times a day (BID) | ORAL | 0 refills | Status: AC

## 2017-08-20 NOTE — Discharge Summary (Signed)
[facility] [place] NAME: [name]    MR#:  [id number]  Coates:  [birth year]  DATE OF ADMISSION:  08/16/2017   ADMITTING PHYSICIAN: Amelia Jo, MD  DATE OF DISCHARGE: 08/20/17  PRIMARY CARE PHYSICIAN: Chrismon, Vickki Muff, PA   ADMISSION DIAGNOSIS:   Hyponatremia [E87.1] Syncope, unspecified syncope type [R55]  DISCHARGE DIAGNOSIS:   Active Problems:   Syncope   SECONDARY DIAGNOSIS:   Past Medical History:  Diagnosis Date  . Breast cyst    removed  . Diverticulitis   . Gallstones   . Hypertension   . Low sodium levels     HOSPITAL COURSE:   84 year old female with past medical history significant for hypertension, diverticulitis presents to hospital secondary to confusion and presyncope  1.  Acute metabolic encephalopathy-secondary to hypotension and hyponatremia. Improved now -Concern for UTI. -Mental status is improved and back to baseline -Carotid Dopplers done as part of syncope work-up were negative for any hemodynamically significant stenosis -Echocardiogram with normal ejection fraction, EF of 59% and mild diastolic dysfunction  2.  Hyponatremia-acute on chronic hyponatremia - usually triggered with underlying infection -Received IV fluids in the hospital for more than 48 hours.  Sodium actually dropped. Stopped fluids and received 1 dose of tolvaptan with improved. - will discharge with fluid restriction- outpatient follow up - nephrology consulted.  3.  UTI-urine cultures growing mixed organisms.  on ciprofloxacin. -Stop after 5 days   Physical therapy recommended home health.  will be discharged today.     DISCHARGE CONDITIONS:   Guarded  CONSULTS OBTAINED:   Treatment Team:  Anthonette Legato, MD  DRUG ALLERGIES:   Allergies  Allergen Reactions  . Accolate [Zafirlukast] Other (See Comments)    Reaction: unknown  . Bactrim [Sulfamethoxazole-Trimethoprim] Nausea And Vomiting    . Metronidazole Other (See Comments)    Reaction: unknown  . Nitrofurantoin Monohyd Macro Other (See Comments)    GI upset  . Penicillins Diarrhea and Other (See Comments)    Has patient had a PCN reaction causing immediate rash, facial/tongue/throat swelling, SOB or lightheadedness with hypotension: Unknown Has patient had a PCN reaction causing severe rash involving mucus membranes or skin necrosis: Unknown Has patient had a PCN reaction that required hospitalization: Unknown Has patient had a PCN reaction occurring within the last 10 years: Unknown If all of the above answers are "NO", then may proceed with Cephalosporin use.  Marland Kitchen Keflex [Cephalexin] Rash   DISCHARGE MEDICATIONS:   Allergies as of 08/20/2017      Reactions   Accolate [zafirlukast] Other (See Comments)   Reaction: unknown   Bactrim [sulfamethoxazole-trimethoprim] Nausea And Vomiting   Metronidazole Other (See Comments)   Reaction: unknown   Nitrofurantoin Monohyd Macro Other (See Comments)   GI upset   Penicillins Diarrhea, Other (See Comments)   Has patient had a PCN reaction causing immediate rash, facial/tongue/throat swelling, SOB or lightheadedness with hypotension: Unknown Has patient had a PCN reaction causing severe rash involving mucus membranes or skin necrosis: Unknown Has patient had a PCN reaction that required hospitalization: Unknown Has patient had a PCN reaction occurring within the last 10 years: Unknown If all of the above answers are "NO", then may proceed with Cephalosporin use.   Keflex [cephalexin] Rash      Medication List    TAKE these medications   acetaminophen 325 MG tablet Commonly known as:  TYLENOL Take 2 tablets (650 mg total) by mouth every 6 (  six) hours as needed for mild pain (or Fever >/= 101).   ciprofloxacin 500 MG tablet Commonly known as:  CIPRO Take 1 tablet (500 mg total) by mouth 2 (two) times daily for 5 days.   enalapril 10 MG tablet Commonly known as:   VASOTEC Take 1 tablet (10 mg total) by mouth daily.   feeding supplement Liqd Take 1 Container by mouth 3 (three) times daily between meals.   metoprolol tartrate 25 MG tablet Commonly known as:  LOPRESSOR Take 0.5 tablets (12.5 mg total) by mouth 2 (two) times daily.   traZODone 100 MG tablet Commonly known as:  DESYREL TAKE 1/2 TABLET (50mg ) BY MOUTH AT BEDTIME What changed:    how much to take  how to take this  when to take this  additional instructions        DISCHARGE INSTRUCTIONS:   1. PCP f/u in 1-2 weeks 2. Nephrology f/u in 2 -3 weeks  DIET:   Cardiac diet  ACTIVITY:   Activity as tolerated  OXYGEN:   Home Oxygen: No.  Oxygen Delivery: room air  DISCHARGE LOCATION:   home   If you experience worsening of your admission symptoms, develop shortness of breath, life threatening emergency, suicidal or homicidal thoughts you must seek medical attention immediately by calling 911 or calling your MD immediately  if symptoms less severe.  You Must read complete instructions/literature along with all the possible adverse reactions/side effects for all the Medicines you take and that have been prescribed to you. Take any new Medicines after you have completely understood and accpet all the possible adverse reactions/side effects.   Please note  You were cared for by a hospitalist during your hospital stay. If you have any questions about your discharge medications or the care you received while you were in the hospital after you are discharged, you can call the unit and asked to speak with the hospitalist on call if the hospitalist that took care of you is not available. Once you are discharged, your primary care physician will handle any further medical issues. Please note that NO REFILLS for any discharge medications will be authorized once you are discharged, as it is imperative that you return to your primary care physician (or establish a relationship with a  primary care physician if you do not have one) for your aftercare needs so that they can reassess your need for medications and monitor your lab values.    On the day of Discharge:  VITAL SIGNS:   Blood pressure (!) 153/64, pulse 81, temperature 98.1 F (36.7 C), temperature source Oral, resp. rate 20, height 5\' 4"  (1.626 m), weight 64.5 kg (142 lb 4.8 oz), SpO2 97 %.  PHYSICAL EXAMINATION:    GENERAL:  84 y.o.-year-old elderly patient lying in the bed with no acute distress.  EYES: Pupils equal, round, reactive to light and accommodation. No scleral icterus. Extraocular muscles intact.  HEENT: Head atraumatic, normocephalic. Oropharynx and nasopharynx clear.  NECK:  Supple, no jugular venous distention. No thyroid enlargement, no tenderness.  LUNGS: Normal breath sounds bilaterally, no wheezing, rales,rhonchi or crepitation. No use of accessory muscles of respiration.  Decreased bibasilar breath sounds CARDIOVASCULAR: S1, S2 normal. No  rubs, or gallops.  2/6 systolic murmur is present ABDOMEN: Soft, nontender, nondistended. Bowel sounds present. No organomegaly or mass.  EXTREMITIES: No pedal edema, cyanosis, or clubbing.  NEUROLOGIC: Cranial nerves II through XII are intact. Muscle strength 5/5 in all extremities. Sensation intact. Gait not checked.  Global weakness noted PSYCHIATRIC: The patient is alert and oriented x 3.  SKIN: No obvious rash, lesion, or ulcer.    DATA REVIEW:   CBC Recent Labs  Lab 08/19/17 0750  WBC 9.5  HGB 11.5*  HCT 32.4*  PLT 154    Chemistries  Recent Labs  Lab 08/19/17 1541  08/20/17 0724  NA 123*   < > 132*  K 3.8  --   --   CL 95*  --   --   CO2 21*  --   --   GLUCOSE 117*  --   --   BUN 23*  --   --   CREATININE 0.95  --   --   CALCIUM 8.4*  --   --    < > = values in this interval not displayed.     Microbiology Results  Results for orders placed or performed during the hospital encounter of 08/16/17  Urine Culture      Status: Abnormal   Collection Time: 08/16/17 11:10 PM  Result Value Ref Range Status   Specimen Description   Final    URINE, RANDOM Performed at [facility], [address] [place], [place] [address]    Special Requests   Final    NONE Performed at [facility], [address] [place], [place] [address]    Culture MULTIPLE SPECIES PRESENT, SUGGEST RECOLLECTION (A)  Final   Report Status 08/18/2017 FINAL  Final    RADIOLOGY:  No results found.   Management plans discussed with the patient, family and they are in agreement.  CODE STATUS:     Code Status Orders  (From admission, onward)        Start     Ordered   08/17/17 0351  Do not attempt resuscitation (DNR)  Continuous    Question Answer Comment  In the event of cardiac or respiratory ARREST Do not call a "code blue"   In the event of cardiac or respiratory ARREST Do not perform Intubation, CPR, defibrillation or ACLS   In the event of cardiac or respiratory ARREST Use medication by any route, position, wound care, and other measures to relive pain and suffering. May use oxygen, suction and manual treatment of airway obstruction as needed for comfort.      08/17/17 0352    Code Status History    Date Active Date Inactive Code Status Order ID Comments User Context   01/11/2017 1646 01/14/2017 1515 DNR [id number]  Bettey Costa, MD Inpatient   01/11/2017 1635 01/11/2017 1646 Full Code [id number]  Bettey Costa, MD Inpatient   01/11/2017 1529 01/11/2017 1635 DNR [id number]  Bettey Costa, MD ED   07/21/2015 1650 07/23/2015 1642 Full Code [id number]  Hower, Aaron Mose, MD ED    Advance Directive Documentation     Most Recent Value  Type of Advance Directive  Healthcare Power of [place], Living will  Pre-existing out of facility DNR order (yellow form or pink MOST form)  -  "MOST" Form in Place?  -      TOTAL TIME TAKING CARE OF THIS PATIENT: 38 minutes.    Gladstone Lighter M.D on 08/20/2017 at 2:45  PM  Between 7am to 6pm - Pager - [phone]  After 6pm go to [email or web address] - Proofreader  [facility]  Office  [phone]  CC: Primary care physician; Margo Common, PA   Note: This dictation was prepared with Dragon dictation along with smaller phrase technology. Any transcriptional  errors that result from this process are unintentional.

## 2017-08-20 NOTE — Care Management Important Message (Signed)
Copy of signed IM left in patient's room.    

## 2017-08-20 NOTE — Care Management Note (Signed)
Case Management Note  Patient Details  Name: [name] MRN: [id number] Date of Birth: 19-Feb-1925  Subjective/Objective:  Home health set up with [facility]. Notified cheryl rose of discharge.                   Action/Plan:   Expected Discharge Date:  08/20/17               Expected Discharge Plan:  [place]  In-House Referral:     Discharge planning Services  CM Consult  Post Acute Care Choice:  Home Health Choice offered to:  Patient, Adult Children  DME Arranged:    DME Agency:     HH Arranged:  RN, PT, Nurse's Aide [place] Agency:  [place]  Status of Service:  Completed, signed off  If discussed at [place] of Stay Meetings, dates discussed:    Additional Comments:  Jolly Mango, RN 08/20/2017, 2:39 PM

## 2017-08-20 NOTE — Progress Notes (Signed)
Patient discharged home per MD order. All discharge instructions given and all questions answered. 

## 2017-08-20 NOTE — Progress Notes (Signed)
[facility]  ROUNDING NOTE   Subjective:  Serum sodium up to 132 today. Overall feeling better as compared to yesterday.  Objective:  Vital signs in last 24 hours:  Temp:  [97.6 F (36.4 C)-98.1 F (36.7 C)] 97.6 F (36.4 C) (05/10 0358) Pulse Rate:  [73-85] 73 (05/10 0358) Resp:  [15-18] 15 (05/10 0358) BP: (130-132)/(46-59) 131/59 (05/10 0358) SpO2:  [95 %-98 %] 96 % (05/10 0358) Weight:  [64.5 kg (142 lb 4.8 oz)] 64.5 kg (142 lb 4.8 oz) (05/10 0323)  Weight change:  Filed Weights   08/17/17 0350 08/18/17 0408 08/20/17 0323  Weight: 54.9 kg (121 lb 1.6 oz) 55.1 kg (121 lb 8 oz) 64.5 kg (142 lb 4.8 oz)    Intake/Output: I/O last 3 completed shifts: In: 5237.5 [P.O.:840; I.V.:3597.5; IV Piggyback:800] Out: 3150 [Urine:3150]   Intake/Output this shift:  No intake/output data recorded.  Physical Exam: General: No acute distress  Head: Normocephalic, atraumatic. Moist oral mucosal membranes  Eyes: Anicteric  Neck: Supple, trachea midline  Lungs:  Clear to auscultation, normal effort  Heart: S1S2 no rubs  Abdomen:  Soft, nontender, bowel sounds present  Extremities: Trace peripheral edema.  Neurologic: Awake, alert, following commands  Skin: No lesions       Basic Metabolic Panel: Recent Labs  Lab 08/16/17 2310 08/17/17 0405 08/18/17 0357 08/19/17 0750 08/19/17 1541 08/19/17 2332 08/20/17 0724  NA 126* 126* 128* 125* 123* 127* 132*  K 4.1 4.0 3.9 3.6 3.8  --   --   CL 93* 95* 99* 98* 95*  --   --   CO2 25 23 22  20* 21*  --   --   GLUCOSE 94 89 119* 94 117*  --   --   BUN 23* 25* 20 19 23*  --   --   CREATININE 1.00 0.98 0.80 0.82 0.95  --   --   CALCIUM 9.3 9.0 8.9 8.2* 8.4*  --   --     Liver Function Tests: No results for input(s): AST, ALT, ALKPHOS, BILITOT, PROT, ALBUMIN in the last 168 hours. No results for input(s): LIPASE, AMYLASE in the last 168 hours. No results for input(s): AMMONIA in the last 168 hours.  CBC: Recent Labs   Lab 08/16/17 2127 08/17/17 0405 08/19/17 0750  WBC 8.2 10.9 9.5  HGB 12.7 12.9 11.5*  HCT 36.3 36.8 32.4*  MCV 92.6 93.3 93.6  PLT 194 189 154    Cardiac Enzymes: Recent Labs  Lab 08/16/17 2310 08/17/17 0405  TROPONINI <0.03 <0.03    BNP: Invalid input(s): POCBNP  CBG: Recent Labs  Lab 08/17/17 0620 08/17/17 0736 08/18/17 0847 08/20/17 0805  GLUCAP 116* 82 117* 31    Microbiology: Results for orders placed or performed during the hospital encounter of 08/16/17  Urine Culture     Status: Abnormal   Collection Time: 08/16/17 11:10 PM  Result Value Ref Range Status   Specimen Description   Final    URINE, RANDOM Performed at [facility], [address] [place], [place] [address]    Special Requests   Final    NONE Performed at [facility], [address] [place], [place] [address]    Culture MULTIPLE SPECIES PRESENT, SUGGEST RECOLLECTION (A)  Final   Report Status 08/18/2017 FINAL  Final    Coagulation Studies: No results for input(s): LABPROT, INR in the last 72 hours.  Urinalysis: No results for input(s): COLORURINE, LABSPEC, [place], [place], [place], [place], [place], [place], [place],  NITRITE, LEUKOCYTESUR in the last 72 hours.  Invalid input(s): APPERANCEUR    Imaging: No results found.   Medications:    . ciprofloxacin  500 mg Oral BID  . docusate sodium  100 mg Oral BID  . enalapril  10 mg Oral Daily  . heparin  5,000 Units Subcutaneous Q8H   acetaminophen **OR** acetaminophen, bisacodyl, HYDROcodone-acetaminophen, ondansetron **OR** ondansetron (ZOFRAN) IV, traZODone  Assessment/ Plan:  84 y.o. female with hypertension, recurrent hyponatremia, prior history of UTI.    1.  Hyponatremia.  Serum sodium up to 132 from 126 yesterday.  We have discontinued tolvaptan.  Continue to monitor serum sodium as an outpatient.  It appears that her hyponatremia was likely related to her acute illness from urinary  tract infection.  2.  Hypertension.  Continue enalapril 10 mg p.o. daily.     LOS: 3 LATEEF, MUNSOOR 5/10/201912:41 PM

## 2017-08-23 ENCOUNTER — Telehealth: Payer: Self-pay

## 2017-08-23 NOTE — Telephone Encounter (Signed)
Transition Care Management Follow-Up Telephone Call   Date discharged and where: [facility] on 08/20/17.  How have you been since you were released from the hospital? Doing well, declines feeling faint, SOB, dizzy, n/v/d or having any urinary symptoms.   Any patient concerns? None.  Items Reviewed:   Meds: Declined, will bring meds or list to f/u apt on 08/27/17 to review.   Allergies: verified  Dietary Changes Reviewed: low sodium heart healthy.  Functional Questionnaire:  Independent-I Dependent-D  ADLs:   Dressing- I    Eating- I   Maintaining continence- I   Transferring- I   Transportation- D   Meal Prep- I   Managing Meds- D  Confirmed importance and Date/Time of follow-up visits scheduled: 08/27/17 @ 10 AM   Confirmed with patient if condition worsens to call PCP or go to the Emergency Dept. Patient was given office number and encouraged to call back with questions or concerns: YES

## 2017-08-23 NOTE — Telephone Encounter (Signed)
Acknowledged.

## 2017-08-27 ENCOUNTER — Encounter: Payer: Self-pay | Admitting: Family Medicine

## 2017-08-27 ENCOUNTER — Ambulatory Visit (INDEPENDENT_AMBULATORY_CARE_PROVIDER_SITE_OTHER): Payer: PPO | Admitting: Family Medicine

## 2017-08-27 VITALS — BP 142/70 | HR 60 | Temp 98.4°F | Wt 132.6 lb

## 2017-08-27 DIAGNOSIS — R609 Edema, unspecified: Secondary | ICD-10-CM

## 2017-08-27 DIAGNOSIS — Z87440 Personal history of urinary (tract) infections: Secondary | ICD-10-CM

## 2017-08-27 DIAGNOSIS — E871 Hypo-osmolality and hyponatremia: Secondary | ICD-10-CM

## 2017-08-27 DIAGNOSIS — D649 Anemia, unspecified: Secondary | ICD-10-CM | POA: Diagnosis not present

## 2017-08-27 NOTE — Progress Notes (Signed)
[name]    DOB: [birth year]   84 y.o.   MRN: [id number] Visit Date: 08/27/2017  Today's Provider: Vernie Murders, PA   Chief Complaint  Patient presents with  . Hospitalization Follow-up   Subjective:    HPI  Follow up Hospitalization  Patient was admitted to [facility] on 08/17/17 and discharged on 08/20/17. She was treated for Syncope and Hyponatremia. Treatment for this included started Cipro 500 mg. Telephone follow up was done on 08/23/17 She reports good compliance with treatment. She reports this condition is slightly Improved.  ------------------------------------------------------------------------------------ Past Medical History:  Diagnosis Date  . Breast cyst    removed  . Diverticulitis   . Gallstones   . Hypertension   . Low sodium levels    Past Surgical History:  Procedure Laterality Date  . ABDOMINAL HYSTERECTOMY  2000  . APPENDECTOMY  1940's  . bladder tack  1990's  . BREAST CYST INCISION AND DRAINAGE Left 1990's  . CHOLECYSTECTOMY  09/11/13   Cholangiogram suggested a retained stone ductal dilatation.  Marland Kitchen ERCP W/ SPHICTEROTOMY  09/12/2013   Recurrent stone identified post cholecystectomy.  Marland Kitchen NASAL SINUS SURGERY     Family History  Problem Relation Age of Onset  . Hypertension Father   . CAD Father   . Cancer Brother        unknown type   Allergies  Allergen Reactions  . Accolate [Zafirlukast] Other (See Comments)    Reaction: unknown  . Bactrim [Sulfamethoxazole-Trimethoprim] Nausea And Vomiting  . Metronidazole Other (See Comments)    Reaction: unknown  . Nitrofurantoin Monohyd Macro Other (See Comments)    GI upset  . Penicillins Diarrhea and Other (See Comments)    Has patient had a PCN reaction causing immediate rash, facial/tongue/throat swelling, SOB or lightheadedness with hypotension: Unknown Has patient had a PCN reaction causing severe rash involving mucus membranes or skin necrosis: Unknown Has patient had  a PCN reaction that required hospitalization: Unknown Has patient had a PCN reaction occurring within the last 10 years: Unknown If all of the above answers are "NO", then may proceed with Cephalosporin use.  Marland Kitchen Keflex [Cephalexin] Rash    Current Outpatient Medications:  .  acetaminophen (TYLENOL) 325 MG tablet, Take 2 tablets (650 mg total) by mouth every 6 (six) hours as needed for mild pain (or Fever >/= 101)., Disp: , Rfl:  .  enalapril (VASOTEC) 10 MG tablet, Take 1 tablet (10 mg total) by mouth daily., Disp: 90 tablet, Rfl: 3 .  feeding supplement (BOOST / RESOURCE BREEZE) LIQD, Take 1 Container by mouth 3 (three) times daily between meals., Disp: 30 Container, Rfl: 0 .  metoprolol tartrate (LOPRESSOR) 25 MG tablet, Take 0.5 tablets (12.5 mg total) by mouth 2 (two) times daily., Disp: 180 tablet, Rfl: 1 .  traZODone (DESYREL) 100 MG tablet, TAKE 1/2 TABLET (50mg ) BY MOUTH AT BEDTIME, Disp: 90 tablet, Rfl: 2  Review of Systems  Constitutional: Positive for fatigue.  Respiratory: Negative.   Cardiovascular: Negative.   Neurological: Positive for weakness.   Social History   Tobacco Use  . Smoking status: Never Smoker  . Smokeless tobacco: Never Used  Substance Use Topics  . Alcohol use: No   Objective:   BP (!) 142/70 (BP Location: Right Arm, Patient Position: Sitting, Cuff Size: Normal)   Pulse 60   Temp 98.4 F (36.9 C) (Oral)   Wt 132 lb 9.6 oz (60.1 kg)   SpO2 99%  BMI 22.76 kg/m  Wt Readings from Last 3 Encounters:  08/27/17 132 lb 9.6 oz (60.1 kg)  08/20/17 142 lb 4.8 oz (64.5 kg)  08/16/17 119 lb 6.4 oz (54.2 kg)   Physical Exam  Constitutional: She is oriented to person, place, and time. She appears well-developed and well-nourished. No distress.  HENT:  Head: Normocephalic and atraumatic.  Right Ear: Hearing normal.  Left Ear: Hearing normal.  Nose: Nose normal.  Eyes: Conjunctivae and lids are normal. Right eye exhibits no discharge. Left eye exhibits no  discharge. No scleral icterus.  Neck: Neck supple. No JVD present.  Cardiovascular: Normal rate, regular rhythm and normal heart sounds.  Pulmonary/Chest: Effort normal and breath sounds normal. No respiratory distress.  Abdominal: Bowel sounds are normal.  Musculoskeletal: She exhibits edema.  3-4+ pitting edema of feet and lower legs up to mid-calf bilaterally.  Neurological: She is alert and oriented to person, place, and time.  Skin: Skin is intact. No lesion and no rash noted.  Psychiatric: She has a normal mood and affect. Her speech is normal and behavior is normal. Thought content normal.      Assessment & Plan:     1. Hyponatremia Hospitalized with syncopal episode and sodium down to 126 with UTI on 08-16-17 to 08-20-17. Treated with Tolvaptan in the hospital with IV fluids. Sodium back up to 132 at discharge and regained 20+ lbs. Has been eating more and getting Ensure supplement between meals 2-3 times a day. Weight back down 10 lbs in the past week but still feels fatigued and having peripheral pitting edema. Will check labs to be sure UTI cleared, follow up sodium level with renal function and BNP. Has an appointment with Dr. Candiss Norse (nephrologist) on 09-16-17. Follow up pending lab reports. - CBC with Differential/Platelet - Comprehensive metabolic panel - TSH - Urinalysis, Routine w reflex microscopic - Sodium, urine, random - B Nat Peptide  2. Personal history UTI Treated with CIpro for pyuria with multiple bacteria on urine culture during hospitalization on 08-16-17. Finished 10 day treatment and no specific urinary symptoms today. Will recheck urinalysis. - Urinalysis, Routine w reflex microscopic  3. Peripheral edema Gained 20+lbs with IV hydration since hospitalization on 08-16-17. Denies dyspnea, chest pains, palpitations or GI upset. Will check BNP and routine labs to rule out CHF versus SIADH. - CBC with Differential/Platelet - Comprehensive metabolic panel - TSH - [place], [place]  [facility]

## 2017-08-28 LAB — COMPREHENSIVE METABOLIC PANEL
ALT: 27 IU/L (ref 0–32)
AST: 32 IU/L (ref 0–40)
Albumin/Globulin Ratio: 1.8 (ref 1.2–2.2)
Albumin: 4.2 g/dL (ref 3.2–4.6)
Alkaline Phosphatase: 78 IU/L (ref 39–117)
BUN/Creatinine Ratio: 22 (ref 12–28)
BUN: 18 mg/dL (ref 10–36)
Bilirubin Total: 0.3 mg/dL (ref 0.0–1.2)
CO2: 23 mmol/L (ref 20–29)
Calcium: 10 mg/dL (ref 8.7–10.3)
Chloride: 94 mmol/L — ABNORMAL LOW (ref 96–106)
Creatinine, Ser: 0.83 mg/dL (ref 0.57–1.00)
GFR calc Af Amer: 72 mL/min/{1.73_m2} (ref >59)
GFR calc non Af Amer: 62 mL/min/{1.73_m2} (ref >59)
Globulin, Total: 2.4 g/dL (ref 1.5–4.5)
Glucose: 84 mg/dL (ref 65–99)
Potassium: 4.3 mmol/L (ref 3.5–5.2)
Sodium: 132 mmol/L — ABNORMAL LOW (ref 134–144)
Total Protein: 6.6 g/dL (ref 6.0–8.5)

## 2017-08-28 LAB — CBC WITH DIFFERENTIAL/PLATELET
Basophils Absolute: 0 10*3/uL (ref 0.0–0.2)
Basos: 0 %
EOS (ABSOLUTE): 0.1 10*3/uL (ref 0.0–0.4)
Eos: 2 %
Hematocrit: 32.7 % — ABNORMAL LOW (ref 34.0–46.6)
Hemoglobin: 10.9 g/dL — ABNORMAL LOW (ref 11.1–15.9)
Immature Grans (Abs): 0 10*3/uL (ref 0.0–0.1)
Immature Granulocytes: 0 %
Lymphocytes Absolute: 1.4 10*3/uL (ref 0.7–3.1)
Lymphs: 23 %
MCH: 31.6 pg (ref 26.6–33.0)
MCHC: 33.3 g/dL (ref 31.5–35.7)
MCV: 95 fL (ref 79–97)
Monocytes Absolute: 0.5 10*3/uL (ref 0.1–0.9)
Monocytes: 8 %
Neutrophils Absolute: 4.1 10*3/uL (ref 1.4–7.0)
Neutrophils: 67 %
Platelets: 334 10*3/uL (ref 150–379)
RBC: 3.45 x10E6/uL — ABNORMAL LOW (ref 3.77–5.28)
RDW: 13 % (ref 12.3–15.4)
WBC: 6.1 10*3/uL (ref 3.4–10.8)

## 2017-08-28 LAB — URINALYSIS, ROUTINE W REFLEX MICROSCOPIC
Bilirubin, UA: NEGATIVE
Glucose, UA: NEGATIVE
Ketones, UA: NEGATIVE
Nitrite, UA: NEGATIVE
Protein, UA: NEGATIVE
RBC, UA: NEGATIVE
Specific Gravity, UA: 1.015 (ref 1.005–1.030)
Urobilinogen, Ur: 0.2 mg/dL (ref 0.2–1.0)
pH, UA: 6.5 (ref 5.0–7.5)

## 2017-08-28 LAB — TSH: TSH: 2.83 u[IU]/mL (ref 0.450–4.500)

## 2017-08-28 LAB — MICROSCOPIC EXAMINATION: Casts: NONE SEEN /lpf

## 2017-08-28 LAB — BRAIN NATRIURETIC PEPTIDE: BNP: 264.8 pg/mL — ABNORMAL HIGH (ref 0.0–100.0)

## 2017-08-28 LAB — SODIUM, URINE, RANDOM: Sodium, Ur: 113 mmol/L

## 2017-08-30 ENCOUNTER — Other Ambulatory Visit: Payer: Self-pay | Admitting: Family Medicine

## 2017-08-30 ENCOUNTER — Telehealth: Payer: Self-pay

## 2017-08-30 ENCOUNTER — Telehealth: Payer: Self-pay | Admitting: Family Medicine

## 2017-08-30 DIAGNOSIS — R609 Edema, unspecified: Secondary | ICD-10-CM

## 2017-08-30 MED ORDER — FUROSEMIDE 20 MG PO TABS: 20.0000 mg | ORAL_TABLET | Freq: Every day | ORAL | 0 refills | Status: DC

## 2017-08-30 NOTE — Telephone Encounter (Signed)
Sent Lasix 20 mg qd to the [facility]. Must check BP, weight, swelling of legs and sodium level in a week - schedule appointment.

## 2017-08-30 NOTE — Telephone Encounter (Signed)
Patient advised, please see telephone encounter. KW

## 2017-08-30 NOTE — Telephone Encounter (Signed)
-----   Message from Margo Common, [place] sent at 08/30/2017  8:40 AM EDT ----- Sodium level still low but better than in the hospital. BNP test high but echocardiogram showed EF 65-70% (in the hospital). No sign of infection in blood counts and urinalysis. Hgb slightly low. Ask lab to run serum iron and IBC to assess anemia. Proceed with appointment with Dr. Candiss Norse (nephrologist).

## 2017-08-30 NOTE — Telephone Encounter (Signed)
Can send a little diuretic to your pharmacy (which one?), but, will need to recheck weight and electrolytes in a week.

## 2017-08-30 NOTE — Telephone Encounter (Signed)
Patient advised, patient states that she is concerned about swelling in her legs and would like for you to send in prescription to help with swelling. Patient states that she has been using support hoses but it is not helping. Please advise. KW

## 2017-08-30 NOTE — Telephone Encounter (Signed)
Additional labs have been added, patient states that her  pharmacy is [place]

## 2017-08-30 NOTE — Telephone Encounter (Signed)
Pt was in Friday and is calling for test results from that visit  Her call back is [phone]  [facility]

## 2017-08-30 NOTE — Telephone Encounter (Signed)
[id number] 

## 2017-08-31 NOTE — Telephone Encounter (Signed)
Patient advised. She states she picked up RX yesterday. 1 week follow up scheduled.

## 2017-09-07 ENCOUNTER — Ambulatory Visit (INDEPENDENT_AMBULATORY_CARE_PROVIDER_SITE_OTHER): Payer: PPO | Admitting: Family Medicine

## 2017-09-07 ENCOUNTER — Encounter: Payer: Self-pay | Admitting: Family Medicine

## 2017-09-07 VITALS — BP 118/70 | HR 61 | Temp 97.8°F | Wt 130.8 lb

## 2017-09-07 DIAGNOSIS — E871 Hypo-osmolality and hyponatremia: Secondary | ICD-10-CM | POA: Diagnosis not present

## 2017-09-07 DIAGNOSIS — R609 Edema, unspecified: Secondary | ICD-10-CM

## 2017-09-07 LAB — IRON AND TIBC
Iron Saturation: 27 % (ref 15–55)
Iron: 68 ug/dL (ref 27–139)
Total Iron Binding Capacity: 255 ug/dL (ref 250–450)
UIBC: 187 ug/dL (ref 118–369)

## 2017-09-07 LAB — SPECIMEN STATUS REPORT

## 2017-09-07 NOTE — Progress Notes (Signed)
[name]    DOB: [birth year]   84 y.o.   MRN: [id number] Visit Date: 09/07/2017  Today's Provider: Vernie Murders, PA   Chief Complaint  Patient presents with  . Follow-up   Subjective:    HPI Patient presents for a 1 week follow up to recheck weight, leg swelling, sodium levels and blood pressure. Last OV was on 08/27/17. Patient had labs done on 08/27/17. Results showed sodium and hgb level is low. Patient advised to start a multivitamin with minerals daily and continue Ensure 2-3 times a day between meals. She reports fair compliance with treatment plan. She states she is not taking the multivitamin every day and she is only drinking Ensure 1-2 times daily. She reports leg swelling and leg pain is worse than last OV.  Wt Readings from Last 3 Encounters:  09/07/17 130 lb 12.8 oz (59.3 kg)  08/27/17 132 lb 9.6 oz (60.1 kg)  08/20/17 142 lb 4.8 oz (64.5 kg)    BP Readings from Last 3 Encounters:  09/07/17 118/70  08/27/17 (!) 142/70  08/20/17 (!) 153/64   Past Medical History:  Diagnosis Date  . Breast cyst    removed  . Diverticulitis   . Gallstones   . Hypertension   . Low sodium levels    Past Surgical History:  Procedure Laterality Date  . ABDOMINAL HYSTERECTOMY  2000  . APPENDECTOMY  1940's  . bladder tack  1990's  . BREAST CYST INCISION AND DRAINAGE Left 1990's  . CHOLECYSTECTOMY  09/11/13   Cholangiogram suggested a retained stone ductal dilatation.  Marland Kitchen ERCP W/ SPHICTEROTOMY  09/12/2013   Recurrent stone identified post cholecystectomy.  Marland Kitchen NASAL SINUS SURGERY     Family History  Problem Relation Age of Onset  . Hypertension Father   . CAD Father   . Cancer Brother        unknown type   Allergies  Allergen Reactions  . Accolate [Zafirlukast] Other (See Comments)    Reaction: unknown  . Bactrim [Sulfamethoxazole-Trimethoprim] Nausea And Vomiting  . Metronidazole Other (See Comments)    Reaction: unknown  . Nitrofurantoin Monohyd  Macro Other (See Comments)    GI upset  . Penicillins Diarrhea and Other (See Comments)    Has patient had a PCN reaction causing immediate rash, facial/tongue/throat swelling, SOB or lightheadedness with hypotension: Unknown Has patient had a PCN reaction causing severe rash involving mucus membranes or skin necrosis: Unknown Has patient had a PCN reaction that required hospitalization: Unknown Has patient had a PCN reaction occurring within the last 10 years: Unknown If all of the above answers are "NO", then may proceed with Cephalosporin use.  Marland Kitchen Keflex [Cephalexin] Rash    Current Outpatient Medications:  .  acetaminophen (TYLENOL) 325 MG tablet, Take 2 tablets (650 mg total) by mouth every 6 (six) hours as needed for mild pain (or Fever >/= 101)., Disp: , Rfl:  .  enalapril (VASOTEC) 10 MG tablet, Take 1 tablet (10 mg total) by mouth daily., Disp: 90 tablet, Rfl: 3 .  feeding supplement (BOOST / RESOURCE BREEZE) LIQD, Take 1 Container by mouth 3 (three) times daily between meals., Disp: 30 Container, Rfl: 0 .  furosemide (LASIX) 20 MG tablet, Take 1 tablet (20 mg total) by mouth daily., Disp: 20 tablet, Rfl: 0 .  metoprolol tartrate (LOPRESSOR) 25 MG tablet, Take 0.5 tablets (12.5 mg total) by mouth 2 (two) times daily., Disp: 180 tablet, Rfl: 1 .  traZODone (DESYREL)  100 MG tablet, TAKE 1/2 TABLET (50mg ) BY MOUTH AT BEDTIME, Disp: 90 tablet, Rfl: 2  Review of Systems  Constitutional: Negative.   Respiratory: Negative.   Cardiovascular: Positive for leg swelling.   Social History   Tobacco Use  . Smoking status: Never Smoker  . Smokeless tobacco: Never Used  Substance Use Topics  . Alcohol use: No   Objective:   BP 118/70 (BP Location: Right Arm, Patient Position: Sitting, Cuff Size: Normal)   Pulse 61   Temp 97.8 F (36.6 C) (Oral)   Wt 130 lb 12.8 oz (59.3 kg)   SpO2 99%   BMI 22.45 kg/m   Physical Exam  Constitutional: She is oriented to person, place, and time. She  appears well-developed and well-nourished. No distress.  HENT:  Head: Normocephalic and atraumatic.  Right Ear: Hearing normal.  Left Ear: Hearing normal.  Nose: Nose normal.  Eyes: Conjunctivae and lids are normal. Right eye exhibits no discharge. Left eye exhibits no discharge. No scleral icterus.  Neck: Neck supple. No JVD present.  Cardiovascular: Normal rate and regular rhythm.  Pulmonary/Chest: Effort normal and breath sounds normal. No respiratory distress.  Abdominal: Soft. Bowel sounds are normal.  Musculoskeletal: Normal range of motion. She exhibits edema.  3+ pitting edema of lower legs bilaterally.  Neurological: She is alert and oriented to person, place, and time.  Skin: Skin is intact. No lesion and no rash noted.  Psychiatric: She has a normal mood and affect. Her speech is normal and behavior is normal. Thought content normal.      Assessment & Plan:     1. Peripheral edema Pitting edema of both lower legs. Taking Lasix 20 mg qd and lost 3 lbs the past week. Lower legs and ankles aching. Notices swelling less by morning. States she doesn't have anything she can prop her feet on and she has "so many things to do", she doesn't elevate her feet enough. Recommend continuing the Lasix and support hose. Will check BMP to assess renal function. Denies chest discomfort or dyspnea. - Basic Metabolic Panel (BMET)  2. Hyponatremia Last sodium level was 132 on 08-27-17. With the addition of Lasix the past week for edema, will check BMP to assess level and renal function. Keep appointment with Dr. Candiss Norse (nephrologist) on 05-22-50. - Basic Metabolic Panel (BMET)       Vernie Murders, [facility]

## 2017-09-08 LAB — BASIC METABOLIC PANEL
BUN/Creatinine Ratio: 17 (ref 12–28)
BUN: 16 mg/dL (ref 10–36)
CO2: 21 mmol/L (ref 20–29)
Calcium: 10.2 mg/dL (ref 8.7–10.3)
Chloride: 95 mmol/L — ABNORMAL LOW (ref 96–106)
Creatinine, Ser: 0.96 mg/dL (ref 0.57–1.00)
GFR calc Af Amer: 60 mL/min/{1.73_m2} (ref >59)
GFR calc non Af Amer: 52 mL/min/{1.73_m2} — ABNORMAL LOW (ref >59)
Glucose: 91 mg/dL (ref 65–99)
Potassium: 4.1 mmol/L (ref 3.5–5.2)
Sodium: 137 mmol/L (ref 134–144)

## 2017-09-09 ENCOUNTER — Telehealth: Payer: Self-pay

## 2017-09-09 NOTE — Telephone Encounter (Signed)
Patient advised. Follow up scheduled.

## 2017-09-09 NOTE — Telephone Encounter (Signed)
-----   Message from Margo Common, [place] sent at 09/09/2017  8:21 AM EDT ----- Sodium back to normal now. Proceed with Lasix 20 mg qd to help with swelling. Recheck progress in a month. Proceed with appointment with Dr. Candiss Norse.

## 2017-09-09 NOTE — Telephone Encounter (Signed)
LMTCB

## 2017-09-16 ENCOUNTER — Other Ambulatory Visit: Payer: Self-pay | Admitting: Family Medicine

## 2017-09-16 DIAGNOSIS — R609 Edema, unspecified: Secondary | ICD-10-CM

## 2017-09-16 MED ORDER — FUROSEMIDE 20 MG PO TABS: 20.0000 mg | ORAL_TABLET | Freq: Every day | ORAL | 0 refills | Status: DC

## 2017-09-16 NOTE — Telephone Encounter (Signed)
Pt needs refill on her   Furosemide 20 mg  [facility]

## 2017-09-30 DIAGNOSIS — E871 Hypo-osmolality and hyponatremia: Secondary | ICD-10-CM | POA: Diagnosis not present

## 2017-09-30 DIAGNOSIS — I10 Essential (primary) hypertension: Secondary | ICD-10-CM | POA: Diagnosis not present

## 2017-09-30 DIAGNOSIS — R600 Localized edema: Secondary | ICD-10-CM | POA: Diagnosis not present

## 2017-10-11 ENCOUNTER — Ambulatory Visit (INDEPENDENT_AMBULATORY_CARE_PROVIDER_SITE_OTHER): Payer: PPO | Admitting: Family Medicine

## 2017-10-11 ENCOUNTER — Encounter: Payer: Self-pay | Admitting: Family Medicine

## 2017-10-11 VITALS — BP 122/68 | HR 61 | Temp 97.8°F | Wt 126.4 lb

## 2017-10-11 DIAGNOSIS — E871 Hypo-osmolality and hyponatremia: Secondary | ICD-10-CM | POA: Diagnosis not present

## 2017-10-11 DIAGNOSIS — I10 Essential (primary) hypertension: Secondary | ICD-10-CM | POA: Diagnosis not present

## 2017-10-11 NOTE — Progress Notes (Signed)
[name]    DOB: 1925/03/26   84 y.o.   MRN: [id number] Visit Date: 10/11/2017  Today's Provider: Vernie Murders, PA   Chief Complaint  Patient presents with  . Leg Swelling   Subjective:    HPI Edema & Hyponatremia: Patient presents for a 1 month follow up. Last OV was on 09/07/2017. Labs showed sodium levels were back to normal. Patient advised to continue Lasix for swelling. She reports good compliance with treatment plan. She seen Dr. Candiss Norse and he changed Lasix 20 mg to Torsemide 20 mg. She states symptoms are improved.   Wt Readings from Last 3 Encounters:  10/11/17 126 lb 6.4 oz (57.3 kg)  09/07/17 130 lb 12.8 oz (59.3 kg)  08/27/17 132 lb 9.6 oz (60.1 kg)     Allergies  Allergen Reactions  . Accolate [Zafirlukast] Other (See Comments)    Reaction: unknown  . Bactrim [Sulfamethoxazole-Trimethoprim] Nausea And Vomiting  . Metronidazole Other (See Comments)    Reaction: unknown  . Nitrofurantoin Monohyd Macro Other (See Comments)    GI upset  . Penicillins Diarrhea and Other (See Comments)    Has patient had a PCN reaction causing immediate rash, facial/tongue/throat swelling, SOB or lightheadedness with hypotension: Unknown Has patient had a PCN reaction causing severe rash involving mucus membranes or skin necrosis: Unknown Has patient had a PCN reaction that required hospitalization: Unknown Has patient had a PCN reaction occurring within the last 10 years: Unknown If all of the above answers are "NO", then may proceed with Cephalosporin use.  Marland Kitchen Keflex [Cephalexin] Rash     Current Outpatient Medications:  .  acetaminophen (TYLENOL) 325 MG tablet, Take 2 tablets (650 mg total) by mouth every 6 (six) hours as needed for mild pain (or Fever >/= 101)., Disp: , Rfl:  .  enalapril (VASOTEC) 10 MG tablet, Take 1 tablet (10 mg total) by mouth daily., Disp: 90 tablet, Rfl: 3 .  feeding supplement (BOOST / RESOURCE BREEZE) LIQD, Take 1 Container by  mouth 3 (three) times daily between meals., Disp: 30 Container, Rfl: 0 .  metoprolol tartrate (LOPRESSOR) 25 MG tablet, Take 0.5 tablets (12.5 mg total) by mouth 2 (two) times daily., Disp: 180 tablet, Rfl: 1 .  torsemide (DEMADEX) 20 MG tablet, Take 20 mg by mouth every morning., Disp: , Rfl: 11 .  traZODone (DESYREL) 100 MG tablet, TAKE 1/2 TABLET (50mg ) BY MOUTH AT BEDTIME, Disp: 90 tablet, Rfl: 2  Review of Systems  Constitutional: Negative.   Respiratory: Negative.   Cardiovascular: Positive for leg swelling.    Social History   Tobacco Use  . Smoking status: Never Smoker  . Smokeless tobacco: Never Used  Substance Use Topics  . Alcohol use: No   Objective:   BP 122/68 (BP Location: Right Arm, Patient Position: Sitting, Cuff Size: Normal)   Pulse 61   Temp 97.8 F (36.6 C) (Oral)   Wt 126 lb 6.4 oz (57.3 kg)   SpO2 98%   BMI 21.70 kg/m  Wt Readings from Last 3 Encounters:  10/11/17 126 lb 6.4 oz (57.3 kg)  09/07/17 130 lb 12.8 oz (59.3 kg)  08/27/17 132 lb 9.6 oz (60.1 kg)     Physical Exam  Constitutional: She is oriented to person, place, and time. She appears well-developed and well-nourished. No distress.  HENT:  Head: Normocephalic and atraumatic.  Right Ear: Hearing normal.  Left Ear: Hearing normal.  Nose: Nose normal.  Eyes: Conjunctivae and lids are  normal. Right eye exhibits no discharge. Left eye exhibits no discharge. No scleral icterus.  Neck: Normal range of motion. No tracheal deviation present. No thyromegaly present.  Cardiovascular: Normal rate and regular rhythm.  Pulmonary/Chest: Effort normal and breath sounds normal. No respiratory distress.  Abdominal: Soft. She exhibits no distension and no mass. There is no tenderness. There is no rebound and no guarding.  Musculoskeletal: She exhibits no edema or tenderness.  Lymphadenopathy:    She has no cervical adenopathy.  Neurological: She is alert and oriented to person, place, and time. She has  normal reflexes. She displays normal reflexes. No cranial nerve deficit. She exhibits normal muscle tone. Coordination normal.  Skin: Skin is warm, dry and intact. No lesion and no rash noted. No erythema.  Psychiatric: She has a normal mood and affect. Her speech is normal and behavior is normal. Judgment and thought content normal.      Assessment & Plan:     1. Hyponatremia Sodium was 137 on 09-07-17. Dr. Candiss Norse (nephrologist) switched the Lasix to Demadex 20 mg qd. Has lost 4 more pounds and edema of lower legs is resolving. Feeling better and no muscle cramps or weakness. Will recheck CMP and follow up with Dr. Candiss Norse in September. - Comprehensive metabolic panel  2. Essential (primary) hypertension Well controlled. Tolerating Metoprolol Tartrate 12.5 mg BID and Enalapril 10 mg qd. Denies chest pains, palpitation or dyspnea. Edema in lower legs is resolving. Recheck CBC and CMP. - CBC with Differential/Platelet - Comprehensive metabolic panel       Vernie Murders, PA  [facility]

## 2017-10-12 LAB — COMPREHENSIVE METABOLIC PANEL
ALT: 11 IU/L (ref 0–32)
AST: 20 IU/L (ref 0–40)
Albumin/Globulin Ratio: 2.2 (ref 1.2–2.2)
Albumin: 5 g/dL — ABNORMAL HIGH (ref 3.2–4.6)
Alkaline Phosphatase: 109 IU/L (ref 39–117)
BUN/Creatinine Ratio: 24 (ref 12–28)
BUN: 22 mg/dL (ref 10–36)
Bilirubin Total: 0.4 mg/dL (ref 0.0–1.2)
CO2: 29 mmol/L (ref 20–29)
Calcium: 10 mg/dL (ref 8.7–10.3)
Chloride: 99 mmol/L (ref 96–106)
Creatinine, Ser: 0.93 mg/dL (ref 0.57–1.00)
GFR calc Af Amer: 63 mL/min/{1.73_m2} (ref >59)
GFR calc non Af Amer: 54 mL/min/{1.73_m2} — ABNORMAL LOW (ref >59)
Globulin, Total: 2.3 g/dL (ref 1.5–4.5)
Glucose: 96 mg/dL (ref 65–99)
Potassium: 3.5 mmol/L (ref 3.5–5.2)
Sodium: 140 mmol/L (ref 134–144)
Total Protein: 7.3 g/dL (ref 6.0–8.5)

## 2017-10-12 LAB — CBC WITH DIFFERENTIAL/PLATELET
Basophils Absolute: 0 10*3/uL (ref 0.0–0.2)
Basos: 0 %
EOS (ABSOLUTE): 0.1 10*3/uL (ref 0.0–0.4)
Eos: 2 %
Hematocrit: 33.4 % — ABNORMAL LOW (ref 34.0–46.6)
Hemoglobin: 11.6 g/dL (ref 11.1–15.9)
Immature Grans (Abs): 0 10*3/uL (ref 0.0–0.1)
Immature Granulocytes: 0 %
Lymphocytes Absolute: 1.3 10*3/uL (ref 0.7–3.1)
Lymphs: 24 %
MCH: 31.9 pg (ref 26.6–33.0)
MCHC: 34.7 g/dL (ref 31.5–35.7)
MCV: 92 fL (ref 79–97)
Monocytes Absolute: 0.4 10*3/uL (ref 0.1–0.9)
Monocytes: 7 %
Neutrophils Absolute: 3.7 10*3/uL (ref 1.4–7.0)
Neutrophils: 67 %
Platelets: 208 10*3/uL (ref 150–450)
RBC: 3.64 x10E6/uL — ABNORMAL LOW (ref 3.77–5.28)
RDW: 13.1 % (ref 12.3–15.4)
WBC: 5.5 10*3/uL (ref 3.4–10.8)

## 2017-12-30 DIAGNOSIS — R600 Localized edema: Secondary | ICD-10-CM | POA: Diagnosis not present

## 2017-12-30 DIAGNOSIS — I10 Essential (primary) hypertension: Secondary | ICD-10-CM | POA: Diagnosis not present

## 2017-12-30 DIAGNOSIS — E871 Hypo-osmolality and hyponatremia: Secondary | ICD-10-CM | POA: Diagnosis not present

## 2018-01-04 ENCOUNTER — Ambulatory Visit (INDEPENDENT_AMBULATORY_CARE_PROVIDER_SITE_OTHER): Payer: PPO | Admitting: Family Medicine

## 2018-01-04 ENCOUNTER — Encounter: Payer: Self-pay | Admitting: Family Medicine

## 2018-01-04 VITALS — BP 110/66 | HR 48 | Temp 97.9°F | Resp 15 | Wt 126.0 lb

## 2018-01-04 DIAGNOSIS — I10 Essential (primary) hypertension: Secondary | ICD-10-CM

## 2018-01-04 DIAGNOSIS — Z23 Encounter for immunization: Secondary | ICD-10-CM

## 2018-01-04 DIAGNOSIS — M19042 Primary osteoarthritis, left hand: Secondary | ICD-10-CM

## 2018-01-04 DIAGNOSIS — M19041 Primary osteoarthritis, right hand: Secondary | ICD-10-CM

## 2018-01-04 DIAGNOSIS — E871 Hypo-osmolality and hyponatremia: Secondary | ICD-10-CM | POA: Diagnosis not present

## 2018-01-04 NOTE — Progress Notes (Signed)
[name]    DOB: 1925/01/11   84 y.o.   MRN: [id number] Visit Date: 01/04/2018  Today's Provider: Vernie Murders, PA   Chief Complaint  Patient presents with  . Hyponatremia  . Hypertension   Subjective:    HPI      Hypertension, follow-up:  BP Readings from Last 3 Encounters:  01/04/18 110/66  10/11/17 122/68  09/07/17 118/70    She was last seen for hypertension 2 months ago.  BP at that visit was 122/68. Management changes since that visit include None. She reports excellent compliance with treatment. She is not having side effects.  She is not exercising. She is adherent to low salt diet.   Outside blood pressures are not being checked. She is experiencing none.  Patient denies chest pain, chest pressure/discomfort, claudication, dyspnea, exertional chest pressure/discomfort, fatigue, irregular heart beat, lower extremity edema, near-syncope, orthopnea, palpitations, paroxysmal nocturnal dyspnea, syncope and tachypnea.   Cardiovascular risk factors include advanced age (older than 3 for men, 30 for women) and hypertension.  Use of agents associated with hypertension: none.     Weight trend: stable Wt Readings from Last 3 Encounters:  01/04/18 126 lb (57.2 kg)  10/11/17 126 lb 6.4 oz (57.3 kg)  09/07/17 130 lb 12.8 oz (59.3 kg)    Current diet: in general, a "healthy" diet    ------------------------------------------------------------------------ Hyponatremia Patient returns to office today for follow up visit from 10/11/17. Patient reports good compliance and tolerance on Demadex 20mg , she reports that she did follow up with Dr. Candiss Norse since last office visit and swelling and fluid retention has resolved.  Past Medical History:  Diagnosis Date  . Breast cyst    removed  . Diverticulitis   . Gallstones   . Hypertension   . Low sodium levels    Past Surgical History:  Procedure Laterality Date  . ABDOMINAL HYSTERECTOMY   2000  . APPENDECTOMY  1940's  . bladder tack  1990's  . BREAST CYST INCISION AND DRAINAGE Left 1990's  . CHOLECYSTECTOMY  09/11/13   Cholangiogram suggested a retained stone ductal dilatation.  Marland Kitchen ERCP W/ SPHICTEROTOMY  09/12/2013   Recurrent stone identified post cholecystectomy.  Marland Kitchen NASAL SINUS SURGERY     Family History  Problem Relation Age of Onset  . Hypertension Father   . CAD Father   . Cancer Brother        unknown type   Allergies  Allergen Reactions  . Accolate [Zafirlukast] Other (See Comments)    Reaction: unknown  . Bactrim [Sulfamethoxazole-Trimethoprim] Nausea And Vomiting  . Metronidazole Other (See Comments)    Reaction: unknown  . Nitrofurantoin Monohyd Macro Other (See Comments)    GI upset  . Penicillins Diarrhea and Other (See Comments)    Has patient had a PCN reaction causing immediate rash, facial/tongue/throat swelling, SOB or lightheadedness with hypotension: Unknown Has patient had a PCN reaction causing severe rash involving mucus membranes or skin necrosis: Unknown Has patient had a PCN reaction that required hospitalization: Unknown Has patient had a PCN reaction occurring within the last 10 years: Unknown If all of the above answers are "NO", then may proceed with Cephalosporin use.  Marland Kitchen Keflex [Cephalexin] Rash    Current Outpatient Medications:  .  acetaminophen (TYLENOL) 325 MG tablet, Take 2 tablets (650 mg total) by mouth every 6 (six) hours as needed for mild pain (or Fever >/= 101)., Disp: , Rfl:  .  enalapril (VASOTEC) 10  MG tablet, Take 1 tablet (10 mg total) by mouth daily., Disp: 90 tablet, Rfl: 3 .  feeding supplement (BOOST / RESOURCE BREEZE) LIQD, Take 1 Container by mouth 3 (three) times daily between meals., Disp: 30 Container, Rfl: 0 .  metoprolol tartrate (LOPRESSOR) 25 MG tablet, Take 0.5 tablets (12.5 mg total) by mouth 2 (two) times daily., Disp: 180 tablet, Rfl: 1 .  torsemide (DEMADEX) 20 MG tablet, Take 20 mg by mouth every  morning., Disp: , Rfl: 11 .  traZODone (DESYREL) 100 MG tablet, TAKE 1/2 TABLET (50mg ) BY MOUTH AT BEDTIME, Disp: 90 tablet, Rfl: 2  Review of Systems  Social History   Tobacco Use  . Smoking status: Never Smoker  . Smokeless tobacco: Never Used  Substance Use Topics  . Alcohol use: No   Objective:   BP 110/66   Pulse (!) 48   Temp 97.9 F (36.6 C) (Oral)   Resp 15   Wt 126 lb (57.2 kg)   BMI 21.63 kg/m  Vitals:   01/04/18 1030  BP: 110/66  Pulse: (!) 48  Resp: 15  Temp: 97.9 F (36.6 C)  TempSrc: Oral  Weight: 126 lb (57.2 kg)   Physical Exam  Constitutional: She is oriented to person, place, and time. She appears well-developed and well-nourished. No distress.  HENT:  Head: Normocephalic and atraumatic.  Right Ear: Hearing normal.  Left Ear: Hearing normal.  Nose: Nose normal.  Eyes: Conjunctivae and lids are normal. Right eye exhibits no discharge. Left eye exhibits no discharge. No scleral icterus.  Cardiovascular: Normal rate.  Pulmonary/Chest: Effort normal and breath sounds normal. No respiratory distress.  Abdominal: Soft. Bowel sounds are normal.  Musculoskeletal: She exhibits no edema.  Neurological: She is alert and oriented to person, place, and time.  Skin: Skin is intact. No lesion and no rash noted.  Psychiatric: She has a normal mood and affect. Her speech is normal and behavior is normal. Thought content normal.      Assessment & Plan:     1. Essential (primary) hypertension Well controlled with Vasotec 10 mg qd, Metoprolol 25 mg 1/2 tablet BID and Demadex 20 mg each morning (depending on amount of edema in lower legs - none today). Weight stable and continue feeding supplement (Boost) TID between meals. Stable energy level. Recheck in 4 months.  2. Hyponatremia Feeling well and follow up with Dr. Candiss Norse (nephrologist) on 12-30-17 was accomplished. States he was very pleased with sodium back to 140 in July 2019. He will follow up with her in March  2020. Denies palpitations, muscle weakness or confusion.  3. Arthritis of both hands Unchanged deformities of thumbs and a few fingers both hands from degenerative disease. Denies significant pain. Has some balance issues and uses a walker for support.  4. Need for influenza vaccination - Flu vaccine HIGH DOSE PF       Vernie Murders, PA  [facility]

## 2018-07-10 ENCOUNTER — Other Ambulatory Visit: Payer: Self-pay | Admitting: Family Medicine

## 2018-07-10 DIAGNOSIS — I10 Essential (primary) hypertension: Secondary | ICD-10-CM

## 2018-07-12 ENCOUNTER — Ambulatory Visit: Payer: Self-pay

## 2018-07-17 ENCOUNTER — Other Ambulatory Visit: Payer: Self-pay | Admitting: Family Medicine

## 2018-07-17 DIAGNOSIS — I10 Essential (primary) hypertension: Secondary | ICD-10-CM

## 2018-10-04 ENCOUNTER — Other Ambulatory Visit: Payer: Self-pay | Admitting: Family Medicine

## 2018-10-04 DIAGNOSIS — G4700 Insomnia, unspecified: Secondary | ICD-10-CM

## 2018-11-29 NOTE — Progress Notes (Addendum)
Subjective:   [name] is a 85 y.o. female who presents for Medicare Annual (Subsequent) preventive examination.    This visit is being conducted through telemedicine due to the COVID-19 pandemic. This patient has given me verbal consent via doximity to conduct this visit, patient states they are participating from their home address. Some vital signs may be absent or patient reported.    Patient identification: identified by name, DOB, and current address  Review of Systems:  N/A  Cardiac Risk Factors include: advanced age (>73men, >34 women);hypertension     Objective:     Vitals: There were no vitals taken for this visit.  There is no height or weight on file to calculate BMI. Unable to obtain vitals due to visit being conducted via telephonically.   Advanced Directives 11/30/2018 08/17/2017 07/06/2017 01/11/2017 07/14/2016 08/12/2015 08/12/2015  Does Patient Have a Medical Advance Directive? Yes Yes Yes Yes Yes - No  Type of Paramedic of [place];Living will [place];Living will [place];Living will [place];Living will;Out of facility DNR (pink MOST or yellow form) [place];Living will - -  Does patient want to make changes to medical advance directive? - No - Patient declined - No - Patient declined - - -  Copy of [place] in Chart? No - copy requested No - copy requested No - copy requested No - copy requested No - copy requested - -  Would patient like information on creating a medical advance directive? - - - - - (No Data) Yes - Educational materials given  Pre-existing out of facility DNR order (yellow form or pink MOST form) - - - Physician notified to receive inpatient order - - -    Tobacco Social History   Tobacco Use  Smoking Status Never Smoker  Smokeless Tobacco Never Used     Counseling given: Not Answered   Clinical Intake:  Pre-visit  preparation completed: Yes  Pain : No/denies pain Pain Score: 0-No pain     Nutritional Risks: None Diabetes: No  How often do you need to have someone help you when you read instructions, pamphlets, or other written materials from your doctor or pharmacy?: 1 - Never  Interpreter Needed?: No  Information entered by :: [facility], LPN  Past Medical History:  Diagnosis Date  . Breast cyst    removed  . Diverticulitis   . Gallstones   . Hypertension   . Low sodium levels    Past Surgical History:  Procedure Laterality Date  . ABDOMINAL HYSTERECTOMY  2000  . APPENDECTOMY  1940's  . bladder tack  1990's  . BREAST CYST INCISION AND DRAINAGE Left 1990's  . CHOLECYSTECTOMY  09/11/13   Cholangiogram suggested a retained stone ductal dilatation.  Marland Kitchen ERCP W/ SPHICTEROTOMY  09/12/2013   Recurrent stone identified post cholecystectomy.  Marland Kitchen NASAL SINUS SURGERY     Family History  Problem Relation Age of Onset  . Hypertension Father   . CAD Father   . Cancer Brother        unknown type   Social History   Socioeconomic History  . Marital status: Widowed    Spouse name: Not on file  . Number of children: 1  . Years of education: Not on file  . Highest education level: 12th grade  Occupational History  . Occupation: retired  Scientific laboratory technician  . Financial resource strain: Not hard at all  . Food insecurity  Worry: Never true    Inability: Never true  . Transportation needs    Medical: No    Non-medical: No  Tobacco Use  . Smoking status: Never Smoker  . Smokeless tobacco: Never Used  Substance and Sexual Activity  . Alcohol use: No  . Drug use: No  . Sexual activity: Not on file  Lifestyle  . Physical activity    Days per week: 0 days    Minutes per session: 0 min  . Stress: Not at all  Relationships  . Social Herbalist on phone: Patient refused    Gets together: Patient refused    Attends religious service: Patient refused    Active member of club or  organization: Patient refused    Attends meetings of clubs or organizations: Patient refused    Relationship status: Patient refused  Other Topics Concern  . Not on file  Social History Narrative  . Not on file    Outpatient Encounter Medications as of 11/30/2018  Medication Sig  . acetaminophen (TYLENOL) 325 MG tablet Take 2 tablets (650 mg total) by mouth every 6 (six) hours as needed for mild pain (or Fever >/= 101).  . enalapril (VASOTEC) 10 MG tablet TAKE 1 TABLET BY MOUTH EVERY DAY  . feeding supplement (BOOST / RESOURCE BREEZE) LIQD Take 1 Container by mouth 3 (three) times daily between meals.  . metoprolol tartrate (LOPRESSOR) 25 MG tablet TAKE 0.5 TABLETS (12.5 MG TOTAL) BY MOUTH 2 (TWO) TIMES DAILY.  Marland Kitchen torsemide (DEMADEX) 20 MG tablet Take 20 mg by mouth every morning.  . traZODone (DESYREL) 100 MG tablet TAKE 1/2 TABLET (50MG ) BY MOUTH AT BEDTIME   No facility-administered encounter medications on file as of 11/30/2018.     Activities of Daily Living In your present state of health, do you have any difficulty performing the following activities: 11/30/2018  Hearing? Y  Comment Does not wear hearing aids.  Vision? N  Difficulty concentrating or making decisions? N  Walking or climbing stairs? N  Dressing or bathing? N  Doing errands, shopping? Y  Comment Does not drive.  Preparing Food and eating ? N  Using the Toilet? N  In the past six months, have you accidently leaked urine? Y  Comment Rarely, wear protection as needed.  Do you have problems with loss of bowel control? N  Managing your Medications? N  Managing your Finances? N  Housekeeping or managing your Housekeeping? N  Some recent data might be hidden    Patient Care Team: Chrismon, Vickki Muff, PA as PCP - General (Family Medicine) Murlean Iba, MD (Nephrology)    Assessment:   This is a routine wellness examination for [name].  Exercise Activities and Dietary recommendations Current Exercise Habits:  The patient does not participate in regular exercise at present, Exercise limited by: Other - see comments(Busy being a caregiver.)  Goals    . DIET - INCREASE WATER INTAKE     Recommend increasing water intake to 4-6 glasses a day.        Fall Risk: Fall Risk  11/30/2018 07/06/2017 07/14/2016 07/24/2015 03/13/2015  Falls in the past year? 0 No Yes Yes No  Number falls in past yr: - - 1 1 -  Injury with Fall? - - Yes No -  Comment - - staples in head on 07/21/15 - -  Risk for fall due to : - - - History of fall(s) -  Follow up - - - Falls prevention discussed -  FALL RISK PREVENTION PERTAINING TO THE HOME:  Any stairs in or around the home? No  If so, are there any without handrails? N/A  Home free of loose throw rugs in walkways, pet beds, electrical cords, etc? Yes  Adequate lighting in your home to reduce risk of falls? Yes   ASSISTIVE DEVICES UTILIZED TO PREVENT FALLS:  Life alert? No  Use of a cane, walker or w/c? Yes  Grab bars in the bathroom? No  Shower chair or bench in shower? Yes  Elevated toilet seat or a handicapped toilet? Yes    TIMED UP AND GO:  Was the test performed? No .    Depression Screen PHQ 2/9 Scores 11/30/2018 07/06/2017 07/14/2016 07/14/2016  PHQ - 2 Score 0 0 0 0  PHQ- 9 Score - - 1 -     Cognitive Function: Declined today.      6CIT Screen 07/06/2017 07/14/2016  What Year? 0 points 4 points  What month? 0 points 0 points  What time? 0 points 0 points  Count back from 20 0 points 0 points  Months in reverse 0 points 0 points  Repeat phrase 6 points 6 points  Total Score 6 10    Immunization History  Administered Date(s) Administered  . Influenza Split 01/13/2008, 01/25/2012  . Influenza, High Dose Seasonal PF 01/13/2017, 01/04/2018  . Influenza-Unspecified 12/12/2013  . Pneumococcal Conjugate-13 03/13/2015  . Pneumococcal Polysaccharide-23 04/09/2006  . Zoster 04/23/2006    Qualifies for Shingles Vaccine? Yes  Zostavax completed  04/23/06. Due for Shingrix. Education has been provided regarding the importance of this vaccine. Pt has been advised to call insurance company to determine out of pocket expense. Advised may also receive vaccine at local pharmacy or Health Dept. Verbalized acceptance and understanding.  Tdap: Although this vaccine is not a covered service during a Wellness Exam, does the patient still wish to receive this vaccine today?  No .   Flu Vaccine: Due fall 2020  Pneumococcal Vaccine: Completed series  Screening Tests Health Maintenance  Topic Date Due  . TETANUS/TDAP  02/03/1944  . DEXA SCAN  02/17/2014  . INFLUENZA VACCINE  11/12/2018  . PNA vac Low Risk Adult  Completed    Cancer Screenings:  Colorectal Screening: No longer required.   Mammogram: No longer required.   Bone Density: Completed 03/23/13. Results reflect OSTEOPOROSIS. Repeat every 2 years. Pt declined referral at this time.   Lung Cancer Screening: (Low Dose CT Chest recommended if Age 50-80 years, 30 pack-year currently smoking OR have quit w/in 15years.) does not qualify.   Additional Screening:  Vision Screening: Recommended annual ophthalmology exams for early detection of glaucoma and other disorders of the eye.  Dental Screening: Recommended annual dental exams for proper oral hygiene  Community Resource Referral:  CRR required this visit?  No       Plan:  I have personally reviewed and addressed the Medicare Annual Wellness questionnaire and have noted the following in the patient's chart:  A. Medical and social history B. Use of alcohol, tobacco or illicit drugs  C. Current medications and supplements D. Functional ability and status E.  Nutritional status F.  Physical activity G. Advance directives H. List of other physicians I.  Hospitalizations, surgeries, and ER visits in previous 12 months J.  [place] such as hearing and vision if needed, cognitive and depression L. Referrals and  appointments   In addition, I have reviewed and discussed with patient certain preventive protocols, quality metrics,  and best practice recommendations. A written personalized care plan for preventive services as well as general preventive health recommendations were provided to patient. Nurse Health Advisor  Signed,     [place], [place]  [id number] Nurse Health Advisor   Nurse Notes: Pt declined a DEXA referral today and a future order for a tetanus vaccine. Pt needs a influenza vaccine at next in office visit.   Reviewed note and plan of wellness screening by Nurse Health Advisor. Agree with documentation and recommendations.

## 2018-11-30 ENCOUNTER — Ambulatory Visit (INDEPENDENT_AMBULATORY_CARE_PROVIDER_SITE_OTHER): Payer: PPO

## 2018-11-30 ENCOUNTER — Other Ambulatory Visit: Payer: Self-pay

## 2018-11-30 DIAGNOSIS — Z0000 Encounter for general adult medical examination without abnormal findings: Secondary | ICD-10-CM

## 2018-11-30 NOTE — Patient Instructions (Signed)
[name] , Thank you for taking time to come for your Medicare Wellness Visit. I appreciate your ongoing commitment to your health goals. Please review the following plan we discussed and let me know if I can assist you in the future.   Screening recommendations/referrals: Colonoscopy: No longer required.  Mammogram: No longer required.  Bone Density: Pt declines order today.  Recommended yearly ophthalmology/optometry visit for glaucoma screening and checkup Recommended yearly dental visit for hygiene and checkup  Vaccinations: Influenza vaccine: Currently due Pneumococcal vaccine: Completed series Tdap vaccine: Pt declines today.  Shingles vaccine: Pt declines today.     Advanced directives: Please bring a copy of your POA (Power of Attorney) and/or Living Will to your next appointment.   Conditions/risks identified: Continue to increase water intake to 6-8 8 oz glasses a day.   Next appointment: Declined scheduling a CPE for this year or an AWV for 2021 at this time.    Preventive Care 5 Years and Older, Female Preventive care refers to lifestyle choices and visits with your health care provider that can promote health and wellness. What does preventive care include?  A yearly physical exam. This is also called an annual well check.  Dental exams once or twice a year.  Routine eye exams. Ask your health care provider how often you should have your eyes checked.  Personal lifestyle choices, including:  Daily care of your teeth and gums.  Regular physical activity.  Eating a healthy diet.  Avoiding tobacco and drug use.  Limiting alcohol use.  Practicing safe sex.  Taking low-dose aspirin every day.  Taking vitamin and mineral supplements as recommended by your health care provider. What happens during an annual well check? The services and screenings done by your health care provider during your annual well check will depend on your age, overall health,  lifestyle risk factors, and family history of disease. Counseling  Your health care provider may ask you questions about your:  Alcohol use.  Tobacco use.  Drug use.  Emotional well-being.  Home and relationship well-being.  Sexual activity.  Eating habits.  History of falls.  Memory and ability to understand (cognition).  Work and work Statistician.  Reproductive health. Screening  You may have the following tests or measurements:  Height, weight, and BMI.  Blood pressure.  Lipid and cholesterol levels. These may be checked every 5 years, or more frequently if you are over 46 years old.  Skin check.  Lung cancer screening. You may have this screening every year starting at age 16 if you have a 30-pack-year history of smoking and currently smoke or have quit within the past 15 years.  Fecal occult blood test (FOBT) of the stool. You may have this test every year starting at age 45.  Flexible sigmoidoscopy or colonoscopy. You may have a sigmoidoscopy every 5 years or a colonoscopy every 10 years starting at age 34.  Hepatitis C blood test.  Hepatitis B blood test.  Sexually transmitted disease (STD) testing.  Diabetes screening. This is done by checking your blood sugar (glucose) after you have not eaten for a while (fasting). You may have this done every 1-3 years.  Bone density scan. This is done to screen for osteoporosis. You may have this done starting at age 64.  Mammogram. This may be done every 1-2 years. Talk to your health care provider about how often you should have regular mammograms. Talk with your health care provider about your test results, treatment options, and  if necessary, the need for more tests. Vaccines  Your health care provider may recommend certain vaccines, such as:  Influenza vaccine. This is recommended every year.  Tetanus, diphtheria, and acellular pertussis (Tdap, Td) vaccine. You may need a Td booster every 10 years.  Zoster  vaccine. You may need this after age 60.  Pneumococcal 13-valent conjugate (PCV13) vaccine. One dose is recommended after age 9.  Pneumococcal polysaccharide (PPSV23) vaccine. One dose is recommended after age 73. Talk to your health care provider about which screenings and vaccines you need and how often you need them. This information is not intended to replace advice given to you by your health care provider. Make sure you discuss any questions you have with your health care provider. Document Released: 04/26/2015 Document Revised: 12/18/2015 Document Reviewed: 01/29/2015 Elsevier Interactive Patient Education  2017 [place] Prevention in the Home Falls can cause injuries. They can happen to people of all ages. There are many things you can do to make your home safe and to help prevent falls. What can I do on the outside of my home?  Regularly fix the edges of walkways and driveways and fix any cracks.  Remove anything that might make you trip as you walk through a door, such as a raised step or threshold.  Trim any bushes or trees on the path to your home.  Use bright outdoor lighting.  Clear any walking paths of anything that might make someone trip, such as rocks or tools.  Regularly check to see if handrails are loose or broken. Make sure that both sides of any steps have handrails.  Any raised decks and porches should have guardrails on the edges.  Have any leaves, snow, or ice cleared regularly.  Use sand or salt on walking paths during winter.  Clean up any spills in your garage right away. This includes oil or grease spills. What can I do in the bathroom?  Use night lights.  Install grab bars by the toilet and in the tub and shower. Do not use towel bars as grab bars.  Use non-skid mats or decals in the tub or shower.  If you need to sit down in the shower, use a plastic, non-slip stool.  Keep the floor dry. Clean up any water that spills on the  floor as soon as it happens.  Remove soap buildup in the tub or shower regularly.  Attach bath mats securely with double-sided non-slip rug tape.  Do not have throw rugs and other things on the floor that can make you trip. What can I do in the bedroom?  Use night lights.  Make sure that you have a light by your bed that is easy to reach.  Do not use any sheets or blankets that are too big for your bed. They should not hang down onto the floor.  Have a firm chair that has side arms. You can use this for support while you get dressed.  Do not have throw rugs and other things on the floor that can make you trip. What can I do in the kitchen?  Clean up any spills right away.  Avoid walking on wet floors.  Keep items that you use a lot in easy-to-reach places.  If you need to reach something above you, use a strong step stool that has a grab bar.  Keep electrical cords out of the way.  Do not use floor polish or wax that makes floors slippery. If you  must use wax, use non-skid floor wax.  Do not have throw rugs and other things on the floor that can make you trip. What can I do with my stairs?  Do not leave any items on the stairs.  Make sure that there are handrails on both sides of the stairs and use them. Fix handrails that are broken or loose. Make sure that handrails are as long as the stairways.  Check any carpeting to make sure that it is firmly attached to the stairs. Fix any carpet that is loose or worn.  Avoid having throw rugs at the top or bottom of the stairs. If you do have throw rugs, attach them to the floor with carpet tape.  Make sure that you have a light switch at the top of the stairs and the bottom of the stairs. If you do not have them, ask someone to add them for you. What else can I do to help prevent falls?  Wear shoes that:  Do not have high heels.  Have rubber bottoms.  Are comfortable and fit you well.  Are closed at the toe. Do not wear  sandals.  If you use a stepladder:  Make sure that it is fully opened. Do not climb a closed stepladder.  Make sure that both sides of the stepladder are locked into place.  Ask someone to hold it for you, if possible.  Clearly mark and make sure that you can see:  Any grab bars or handrails.  First and last steps.  Where the edge of each step is.  Use tools that help you move around (mobility aids) if they are needed. These include:  Canes.  Walkers.  Scooters.  Crutches.  Turn on the lights when you go into a dark area. Replace any light bulbs as soon as they burn out.  Set up your furniture so you have a clear path. Avoid moving your furniture around.  If any of your floors are uneven, fix them.  If there are any pets around you, be aware of where they are.  Review your medicines with your doctor. Some medicines can make you feel dizzy. This can increase your chance of falling. Ask your doctor what other things that you can do to help prevent falls. This information is not intended to replace advice given to you by your health care provider. Make sure you discuss any questions you have with your health care provider. Document Released: 01/24/2009 Document Revised: 09/05/2015 Document Reviewed: 05/04/2014 Elsevier Interactive Patient Education  2017 [employer].

## 2019-06-28 ENCOUNTER — Other Ambulatory Visit: Payer: Self-pay | Admitting: Family Medicine

## 2019-06-28 DIAGNOSIS — I10 Essential (primary) hypertension: Secondary | ICD-10-CM

## 2019-06-28 NOTE — Telephone Encounter (Signed)
Requested Prescriptions  Pending Prescriptions Disp Refills  . metoprolol tartrate (LOPRESSOR) 25 MG tablet [Pharmacy Med Name: METOPROLOL TARTRATE 25 MG TAB] 90 tablet 3    Sig: TAKE 0.5 TABLETS (12.5 MG TOTAL) BY MOUTH 2 (TWO) TIMES DAILY.     Cardiovascular:  Beta Blockers Failed - 06/28/2019  4:55 AM      Failed - Valid encounter within last 6 months    Recent Outpatient Visits          1 year ago Essential (primary) hypertension   [facility], [place], [place]   1 year ago Hyponatremia   [facility], [place], [place]   1 year ago Peripheral edema   [facility], [place], [place]   1 year ago Hyponatremia   [facility], [place], [place]   1 year ago [place] and fatigue   [facility], [place], [place]             Passed - Last BP in normal range    BP Readings from Last 1 Encounters:  01/04/18 110/66         Passed - Last Heart Rate in normal range    Pulse Readings from Last 1 Encounters:  01/04/18 (!) 48

## 2019-07-06 ENCOUNTER — Other Ambulatory Visit: Payer: Self-pay | Admitting: Family Medicine

## 2019-07-06 DIAGNOSIS — I10 Essential (primary) hypertension: Secondary | ICD-10-CM

## 2019-07-27 ENCOUNTER — Other Ambulatory Visit: Payer: Self-pay | Admitting: Family Medicine

## 2019-07-27 DIAGNOSIS — I10 Essential (primary) hypertension: Secondary | ICD-10-CM

## 2019-07-27 NOTE — Telephone Encounter (Signed)
Requested medication (s) are due for refill today: yes  Requested medication (s) are on the active medication list: yes  Last refill: 06/28/2019  Future visit scheduled:no  Notes to clinic:  spoke with patient and advise her that she needs appointment  Patient states that she doesn't have away to get to appointment Patient has had a courtesy refill   Requested Prescriptions  Pending Prescriptions Disp Refills   metoprolol tartrate (LOPRESSOR) 25 MG tablet [Pharmacy Med Name: METOPROLOL TARTRATE 25 MG TAB] 30 tablet 1    Sig: TAKE 0.5 TABLETS (12.5 MG TOTAL) BY MOUTH 2 (TWO) TIMES DAILY.      Cardiovascular:  Beta Blockers Failed - 07/27/2019 10:31 AM      Failed - Valid encounter within last 6 months    Recent Outpatient Visits           1 year ago Essential (primary) hypertension   [facility], [place], [place]   1 year ago Hyponatremia   [facility], [place], [place]   1 year ago Peripheral edema   [facility], [place], [place]   1 year ago Hyponatremia   [facility], [place], [place]   1 year ago [place] and fatigue   [facility], [place], [place]              Passed - Last BP in normal range    BP Readings from Last 1 Encounters:  01/04/18 110/66          Passed - Last Heart Rate in normal range    Pulse Readings from Last 1 Encounters:  01/04/18 (!) 48

## 2019-08-03 ENCOUNTER — Other Ambulatory Visit: Payer: Self-pay | Admitting: Family Medicine

## 2019-08-03 DIAGNOSIS — I10 Essential (primary) hypertension: Secondary | ICD-10-CM

## 2019-08-03 NOTE — Telephone Encounter (Signed)
Requested medication (s) are due for refill today: yes  Requested medication (s) are on the active medication list: yes  Last refill:  07/06/2019  Future visit scheduled: no  Notes to clinic:  patient had been given courtesy refill and has not schedule follow up   Requested Prescriptions  Pending Prescriptions Disp Refills   enalapril (VASOTEC) 10 MG tablet [Pharmacy Med Name: ENALAPRIL MALEATE 10 MG TAB] 30 tablet 0    Sig: TAKE 1 TABLET BY MOUTH EVERY DAY      Cardiovascular:  ACE Inhibitors Failed - 08/03/2019 11:31 AM      Failed - Cr in normal range and within 180 days    Creat  Date Value Ref Range Status  02/05/2017 0.84 0.60 - 0.88 mg/dL Final    Comment:    For patients >50 years of age, the reference limit for Creatinine is approximately 13% higher for people identified as African-American. .    Creatinine, Ser  Date Value Ref Range Status  10/11/2017 0.93 0.57 - 1.00 mg/dL Final   Creatinine, Urine  Date Value Ref Range Status  01/14/2017 48 mg/dL Final  01/14/2017 47 mg/dL Final          Failed - K in normal range and within 180 days    Potassium  Date Value Ref Range Status  10/11/2017 3.5 3.5 - 5.2 mmol/L Final  09/13/2013 3.7 3.5 - 5.1 mmol/L Final          Failed - Valid encounter within last 6 months    Recent Outpatient Visits           1 year ago Essential (primary) hypertension   [facility], [place], [place]   1 year ago Hyponatremia   [facility], [place], [place]   1 year ago Peripheral edema   [facility], [place], [place]   1 year ago Hyponatremia   [facility], [place], [place]   1 year ago [place] and fatigue   [facility], [place], [place] - Patient is not pregnant      Passed - Last BP in normal range    BP Readings from Last 1 Encounters:  01/04/18 110/66

## 2019-08-16 ENCOUNTER — Other Ambulatory Visit: Payer: Self-pay | Admitting: Family Medicine

## 2019-08-16 DIAGNOSIS — G4700 Insomnia, unspecified: Secondary | ICD-10-CM

## 2019-08-19 ENCOUNTER — Other Ambulatory Visit: Payer: Self-pay | Admitting: Family Medicine

## 2019-08-19 DIAGNOSIS — I10 Essential (primary) hypertension: Secondary | ICD-10-CM

## 2019-08-19 NOTE — Telephone Encounter (Signed)
Called pt and informed her she is overdue an appt. Pt stated that she knew she ways and stated that she has no way of getting to office.  Pt received 30 day courtesy refill 07/06/18 #30. Unable to fill. Routing to office.

## 2019-08-22 ENCOUNTER — Other Ambulatory Visit: Payer: Self-pay | Admitting: Family Medicine

## 2019-08-22 DIAGNOSIS — G4700 Insomnia, unspecified: Secondary | ICD-10-CM

## 2019-08-22 DIAGNOSIS — I10 Essential (primary) hypertension: Secondary | ICD-10-CM

## 2019-08-22 NOTE — Telephone Encounter (Signed)
Requested medication (s) are due for refill today:   Yes  Requested medication (s) are on the active medication list:   Yes  Future visit scheduled:   No   Last ordered: 07/27/2019  #30  1 refill  Clinic note:   notification was made last month that an appt was needed for refills.  Pt needs to make arrangement for rides to appt.  Courtesy refill was given in April.  Returned for provider to review since transportation is an issue for this pt. And courtesy refill has been given.    Requested Prescriptions  Pending Prescriptions Disp Refills   metoprolol tartrate (LOPRESSOR) 25 MG tablet [Pharmacy Med Name: METOPROLOL TARTRATE 25 MG TAB] 30 tablet 1    Sig: TAKE 0.5 TABLETS (12.5 MG TOTAL) BY MOUTH 2 (TWO) TIMES DAILY.      Cardiovascular:  Beta Blockers Failed - 08/22/2019  9:32 AM      Failed - Valid encounter within last 6 months    Recent Outpatient Visits           1 year ago Essential (primary) hypertension   [facility], [place], [place]   1 year ago Hyponatremia   [facility], [place], [place]   1 year ago Peripheral edema   [facility], [place], [place]   1 year ago Hyponatremia   [facility], [place], [place]   2 years ago [place] and fatigue   [facility], [place], [place]              Passed - Last BP in normal range    BP Readings from Last 1 Encounters:  01/04/18 110/66          Passed - Last Heart Rate in normal range    Pulse Readings from Last 1 Encounters:  01/04/18 (!) 48

## 2019-08-22 NOTE — Telephone Encounter (Signed)
Requested medication (s) are due for refill today: yes  Requested medication (s) are on the active medication list:yes  Last refill:  1 year ago  Future visit scheduled:No  I called patient. To schedule.  She states she has no ride  Notes to clinic:  Please advise patient Spoke with her she states she can't get to an appointment and only has 2 enalapril.  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     Requested Prescriptions  Pending Prescriptions Disp Refills   enalapril (VASOTEC) 10 MG tablet [Pharmacy Med Name: ENALAPRIL MALEATE 10 MG TAB] 30 tablet 0    Sig: TAKE 1 TABLET BY MOUTH EVERY DAY      Cardiovascular:  ACE Inhibitors Failed - 08/22/2019  6:38 PM      Failed - Cr in normal range and within 180 days    Creat  Date Value Ref Range Status  02/05/2017 0.84 0.60 - 0.88 mg/dL Final    Comment:    For patients >48 years of age, the reference limit for Creatinine is approximately 13% higher for people identified as African-American. .    Creatinine, Ser  Date Value Ref Range Status  10/11/2017 0.93 0.57 - 1.00 mg/dL Final   Creatinine, Urine  Date Value Ref Range Status  01/14/2017 48 mg/dL Final  01/14/2017 47 mg/dL Final          Failed - K in normal range and within 180 days    Potassium  Date Value Ref Range Status  10/11/2017 3.5 3.5 - 5.2 mmol/L Final  09/13/2013 3.7 3.5 - 5.1 mmol/L Final          Failed - Valid encounter within last 6 months    Recent Outpatient Visits           1 year ago Essential (primary) hypertension   [facility], [place], [place]   1 year ago Hyponatremia   [facility], [place], [place]   1 year ago Peripheral edema   [facility], [place], [place]   1 year ago Hyponatremia   [facility], [place], [place]   2 years ago [place] and fatigue   [facility], [place], [place] - Patient is not  pregnant      Passed - Last BP in normal range    BP Readings from Last 1 Encounters:  01/04/18 110/66            traZODone (DESYREL) 100 MG tablet [Pharmacy Med Name: TRAZODONE 100 MG TABLET] 45 tablet 3    Sig: TAKE 1/2 TABLET (50MG ) BY MOUTH AT BEDTIME      Psychiatry: Antidepressants - Serotonin Modulator Failed - 08/22/2019  6:38 PM      Failed - Valid encounter within last 6 months    Recent Outpatient Visits           1 year ago Essential (primary) hypertension   [facility], [place], [place]   1 year ago Hyponatremia   [facility], [place], [place]   1 year ago Peripheral edema   [facility], [place], [place]   1 year ago Hyponatremia   [facility], [place], [place]   2 years ago [place] and fatigue   [facility], [place], [place]  Passed - Completed PHQ-2 or PHQ-9 in the last 360 days.

## 2019-08-23 ENCOUNTER — Telehealth: Payer: Self-pay | Admitting: *Deleted

## 2019-08-23 DIAGNOSIS — I10 Essential (primary) hypertension: Secondary | ICD-10-CM

## 2019-08-23 MED ORDER — ENALAPRIL MALEATE 10 MG PO TABS: 10.0000 mg | ORAL_TABLET | Freq: Every day | ORAL | 0 refills | Status: DC

## 2019-08-23 NOTE — Telephone Encounter (Signed)
Scheduled telephone visit with pt for 08/25/2019.

## 2019-08-25 ENCOUNTER — Ambulatory Visit (INDEPENDENT_AMBULATORY_CARE_PROVIDER_SITE_OTHER): Payer: PPO | Admitting: Family Medicine

## 2019-08-25 ENCOUNTER — Encounter: Payer: Self-pay | Admitting: Family Medicine

## 2019-08-25 DIAGNOSIS — I10 Essential (primary) hypertension: Secondary | ICD-10-CM

## 2019-08-25 DIAGNOSIS — R609 Edema, unspecified: Secondary | ICD-10-CM | POA: Diagnosis not present

## 2019-08-25 DIAGNOSIS — E871 Hypo-osmolality and hyponatremia: Secondary | ICD-10-CM | POA: Diagnosis not present

## 2019-08-25 DIAGNOSIS — G4700 Insomnia, unspecified: Secondary | ICD-10-CM

## 2019-08-25 NOTE — Progress Notes (Signed)
Virtual telephone visit    Virtual Visit via Telephone Note   This visit type was conducted due to national recommendations for restrictions regarding the COVID-19 Pandemic (e.g. social distancing) in an effort to limit this patient's exposure and mitigate transmission in our community. Due to her co-morbid illnesses, this patient is at least at moderate risk for complications without adequate follow up. This format is felt to be most appropriate for this patient at this time. The patient did not have access to video technology or had technical difficulties with video requiring transitioning to audio format only (telephone). Physical exam was limited to content and character of the telephone converstion.    Patient location: home Provider location: office   Visit Date: 08/25/2019  Today's healthcare provider: Vernie Murders, PA   No chief complaint on file.  Subjective    HPI  Hypertension, follow-up  BP Readings from Last 3 Encounters:  01/04/18 110/66  10/11/17 122/68  09/07/17 118/70   Wt Readings from Last 3 Encounters:  01/04/18 126 lb (57.2 kg)  10/11/17 126 lb 6.4 oz (57.3 kg)  09/07/17 130 lb 12.8 oz (59.3 kg)     She was last seen for hypertension 2 years ago.  Management since that visit includes recommendation to continue with current regiment. Patient was having controlled blood pressure while on Metoprolol, Vasotec and Demadex. Patient also with hyponatremia at that time and was being followed by nephrologist, Dr Candiss Norse. She reports excellent compliance with treatment. She is not having side effects.   Outside blood pressures are not being checked  Pertinent labs: Lab Results  Component Value Date   CHOL 177 01/14/2016   HDL 62 01/14/2016   LDLCALC 89 01/14/2016   TRIG 128 01/14/2016   CHOLHDL 2.9 01/14/2016   Lab Results  Component Value Date   NA 140 10/11/2017   K 3.5 10/11/2017   CREATININE 0.93 10/11/2017   GFRNONAA 54 (L) 10/11/2017   GFRAA 63 10/11/2017   GLUCOSE 96 10/11/2017     The ASCVD Risk score (Goff DC Jr., et al., 2013) failed to calculate for the following reasons:   The 2013 ASCVD risk score is only valid for ages 73 to 29   ---------------------------------------------------------------------------------------------------  Past Medical History:  Diagnosis Date  . Breast cyst    removed  . Diverticulitis   . Gallstones   . Hypertension   . Low sodium levels    Past Surgical History:  Procedure Laterality Date  . ABDOMINAL HYSTERECTOMY  2000  . APPENDECTOMY  1940's  . bladder tack  1990's  . BREAST CYST INCISION AND DRAINAGE Left 1990's  . CHOLECYSTECTOMY  09/11/13   Cholangiogram suggested a retained stone ductal dilatation.  Marland Kitchen ERCP W/ SPHICTEROTOMY  09/12/2013   Recurrent stone identified post cholecystectomy.  Marland Kitchen NASAL SINUS SURGERY     Allergies  Allergen Reactions  . Accolate [Zafirlukast] Other (See Comments)    Reaction: unknown  . Bactrim [Sulfamethoxazole-Trimethoprim] Nausea And Vomiting  . Metronidazole Other (See Comments)    Reaction: unknown  . Nitrofurantoin Monohyd Macro Other (See Comments)    GI upset  . Penicillins Diarrhea and Other (See Comments)    Has patient had a PCN reaction causing immediate rash, facial/tongue/throat swelling, SOB or lightheadedness with hypotension: Unknown Has patient had a PCN reaction causing severe rash involving mucus membranes or skin necrosis: Unknown Has patient had a PCN reaction that required hospitalization: Unknown Has patient had a PCN reaction occurring within the last 10 years:  Unknown If all of the above answers are "NO", then may proceed with Cephalosporin use.  Marland Kitchen Keflex [Cephalexin] Rash   Medications: Outpatient Medications Prior to Visit  Medication Sig  . acetaminophen (TYLENOL) 325 MG tablet Take 2 tablets (650 mg total) by mouth every 6 (six) hours as needed for mild pain (or Fever >/= 101).  . enalapril (VASOTEC) 10 MG  tablet Take 1 tablet (10 mg total) by mouth daily.  . feeding supplement (BOOST / RESOURCE BREEZE) LIQD Take 1 Container by mouth 3 (three) times daily between meals.  . metoprolol tartrate (LOPRESSOR) 25 MG tablet TAKE 0.5 TABLETS (12.5 MG TOTAL) BY MOUTH 2 (TWO) TIMES DAILY.  Marland Kitchen torsemide (DEMADEX) 20 MG tablet Take 20 mg by mouth every morning.  . traZODone (DESYREL) 100 MG tablet TAKE 1/2 TABLET (50MG ) BY MOUTH AT BEDTIME   No facility-administered medications prior to visit.    Review of Systems  Respiratory: Negative for cough, chest tightness, shortness of breath and wheezing.   Cardiovascular: Negative for chest pain, palpitations and leg swelling.  Neurological: Negative for dizziness and headaches.    Objective    There were no vitals taken for this visit. Physical: No apparent respiratory distress during telephonic interview.   Assessment & Plan    1. Essential (primary) hypertension Tolerating Enalapril 10 mg qd, Metoprolol Tartrate 12.5 mg BID and feeling well. No palpitations, chest pains, dyspnea or significant edema with use of Torsemide 20 mg each morning. Has had both COVID vaccinations. Will have the same neighbor that helped with that transportation, check her BP and see if they can bring her to the office for follow up labs. She does not feel she can ask her family due to them all having "full time jobs". Continue present medications.  2. Hyponatremia No significant fatigue or muscle weakness. Continues to do a great deal of house work caring for her son that lives with her. He is a double lower extremity amputee and wheelchair bound. Will check follow up labs. Order left as a future order to be released when she comes by the office.  3. Peripheral edema Torsemide working well to keep edema under control. Recommend she come in to the office for follow up labs.  4. Insomnia, unspecified type Has had very good sleep pattern with using the Trazodone 100 mg 1/2 tablet  at bedtime.     No follow-ups on file.    I discussed the assessment and treatment plan with the patient. The patient was provided an opportunity to ask questions and all were answered. The patient agreed with the plan and demonstrated an understanding of the instructions.   The patient was advised to call back or seek an in-person evaluation if the symptoms worsen or if the condition fails to improve as anticipated.  I provided 15 minutes of non-face-to-face time during this encounter.  Andres Shad, PA, have reviewed all documentation for this visit. The documentation on 08/25/19 for the exam, diagnosis, procedures, and orders are all accurate and complete.   Vernie Murders, [place] [phone] (phone) [phone] (fax)  [place]

## 2019-08-28 NOTE — Progress Notes (Signed)
I,Joseline E Rosas,acting as a Education administrator for [employer].,have documented all relevant documentation on the behalf of [employer],as directed by  [employer] while in the presence of [employer], [place].  Established patient visit   Patient: [name]   DOB: Oct 16, 1924   86 y.o. Female  MRN: [id number] Visit Date: 08/29/2019  Today's healthcare provider: Vernie Murders, PA   Chief Complaint  Patient presents with  . Follow-up    Essential Hypertension   Subjective    HPI   Hypertension, follow-up  BP Readings from Last 3 Encounters:  08/29/19 (!) 186/77  01/04/18 110/66  10/11/17 122/68   Wt Readings from Last 3 Encounters:  08/29/19 123 lb 9.6 oz (56.1 kg)  01/04/18 126 lb (57.2 kg)  10/11/17 126 lb 6.4 oz (57.3 kg)     She was last seen for hypertension on 08/25/19.This was done as a virtual visit.  Because she had not been seen in so long,  she was asked to follow up with an in office visit so we may check her blood pressure.     She reports excellent compliance with treatment. She is not having side effects.  She is following a Regular diet. She is not exercising.Keeps very active. She does not smoke. BP not being checked.  Symptoms: No chest pain No chest pressure  No palpitations No syncope  No dyspnea No orthopnea  No paroxysmal nocturnal dyspnea No lower extremity edema   Pertinent labs: Lab Results  Component Value Date   CHOL 177 01/14/2016   HDL 62 01/14/2016   LDLCALC 89 01/14/2016   TRIG 128 01/14/2016   CHOLHDL 2.9 01/14/2016   Lab Results  Component Value Date   NA 140 10/11/2017   K 3.5 10/11/2017   CREATININE 0.93 10/11/2017   GFRNONAA 54 (L) 10/11/2017   GFRAA 63 10/11/2017   GLUCOSE 96 10/11/2017     The ASCVD Risk score (Goff DC Jr., et al., 2013) failed to calculate for the following reasons:   The 2013 ASCVD risk score is only valid for ages 15 to 75    --------------------------------------------------------------------------------------------------- Past Medical History:  Diagnosis Date  . Breast cyst    removed  . Diverticulitis   . Gallstones   . Hypertension   . Low sodium levels    Past Surgical History:  Procedure Laterality Date  . ABDOMINAL HYSTERECTOMY  2000  . APPENDECTOMY  1940's  . bladder tack  1990's  . BREAST CYST INCISION AND DRAINAGE Left 1990's  . CHOLECYSTECTOMY  09/11/13   Cholangiogram suggested a retained stone ductal dilatation.  Marland Kitchen ERCP W/ SPHICTEROTOMY  09/12/2013   Recurrent stone identified post cholecystectomy.  Marland Kitchen NASAL SINUS SURGERY     Family History  Problem Relation Age of Onset  . Hypertension Father   . CAD Father   . Cancer Brother        unknown type   Allergies  Allergen Reactions  . Accolate [Zafirlukast] Other (See Comments)    Reaction: unknown  . Bactrim [Sulfamethoxazole-Trimethoprim] Nausea And Vomiting  . Metronidazole Other (See Comments)    Reaction: unknown  . Nitrofurantoin Monohyd Macro Other (See Comments)    GI upset  . Penicillins Diarrhea and Other (See Comments)    Has patient had a PCN reaction causing immediate rash, facial/tongue/throat swelling, SOB or lightheadedness with hypotension: Unknown Has patient had a PCN reaction causing severe rash involving mucus membranes or skin necrosis: Unknown Has patient had a  PCN reaction that required hospitalization: Unknown Has patient had a PCN reaction occurring within the last 10 years: Unknown If all of the above answers are "NO", then may proceed with Cephalosporin use.  Marland Kitchen Keflex [Cephalexin] Rash    Medications: Outpatient Medications Prior to Visit  Medication Sig  . acetaminophen (TYLENOL) 325 MG tablet Take 2 tablets (650 mg total) by mouth every 6 (six) hours as needed for mild pain (or Fever >/= 101).  . enalapril (VASOTEC) 10 MG tablet Take 1 tablet (10 mg total) by mouth daily.  . feeding supplement  (BOOST / RESOURCE BREEZE) LIQD Take 1 Container by mouth 3 (three) times daily between meals.  . metoprolol tartrate (LOPRESSOR) 25 MG tablet TAKE 0.5 TABLETS (12.5 MG TOTAL) BY MOUTH 2 (TWO) TIMES DAILY.  Marland Kitchen torsemide (DEMADEX) 20 MG tablet Take 20 mg by mouth every morning.  . traZODone (DESYREL) 100 MG tablet TAKE 1/2 TABLET (50MG ) BY MOUTH AT BEDTIME   No facility-administered medications prior to visit.    Review of Systems  Constitutional: Negative for fatigue and fever.  Eyes: Negative for visual disturbance.  Cardiovascular: Negative for chest pain, palpitations and leg swelling.      Objective    BP (!) 186/77 (BP Location: Right Arm, Patient Position: Sitting, Cuff Size: Normal)   Pulse 60   Temp (!) 97.2 F (36.2 C) (Temporal)   Resp 16   Wt 123 lb 9.6 oz (56.1 kg)   BMI 21.22 kg/m    Physical Exam Vitals reviewed.  Constitutional:      General: She is not in acute distress.    Appearance: Normal appearance. She is normal weight. She is not ill-appearing.  HENT:     Head: Normocephalic and atraumatic.     Right Ear: Tympanic membrane, ear canal and external ear normal.     Left Ear: Tympanic membrane, ear canal and external ear normal.  Eyes:     General: No scleral icterus.    Extraocular Movements: Extraocular movements intact.     Pupils: Pupils are equal, round, and reactive to light.  Cardiovascular:     Heart sounds: Normal heart sounds.  Pulmonary:     Effort: Pulmonary effort is normal.     Breath sounds: Normal breath sounds. No wheezing or rales.  Abdominal:     General: Abdomen is flat. Bowel sounds are normal.  Musculoskeletal:        General: No swelling.     Cervical back: Normal range of motion and neck supple.     Comments: Very loose interphalangeal joints with decreased strength.  Skin:    General: Skin is warm and dry.     Findings: Bruising present.  Neurological:     Mental Status: She is alert and oriented to person, place, and  time.  Psychiatric:        Mood and Affect: Mood normal.        Behavior: Behavior normal.     No results found for any visits on 08/29/19.    Assessment & Plan    1. Essential (primary) hypertension Elevated today.Tolerating Enalapril 10 mg qd, Metoprolol Tartrate 12.5 mg BID and feeling well. No palpitations, chest pains, dyspnea or significant edema with use of Torsemide 20 mg each morning. Weight stable.Continue supplements.  (Boost) Will check labs as below and f/u pending results. - Comprehensive metabolic panel - CBC with Differential/Platelet  2. Hyponatremia Will check labs as below and f/u pending results. - Comprehensive metabolic panel  3.  Peripheral edema Will check labs as below and f/u pending results. No significant edema today. No recent muscle cramps or claudication. - Comprehensive metabolic panel - CBC with Differential/Platelet  4. Primary insomnia Stable. Diagnosis pulled for medication refill. Continue current medical treatment plan. Feels she sleeps better with this medication. Very independent and helps son that is a right AKA and left BKA (they live together). -traZODone (DESYREL) 100 MG tablet -TAKE 1/2 TABLET (50MG ) BY MOUTH AT BEDTIME   Return in about 6 months (around 02/29/2020), or Depending on labs results.Vernie Murders, [place] [phone] (phone) [phone] (fax)  [place]

## 2019-08-29 ENCOUNTER — Ambulatory Visit (INDEPENDENT_AMBULATORY_CARE_PROVIDER_SITE_OTHER): Payer: PPO | Admitting: Family Medicine

## 2019-08-29 ENCOUNTER — Other Ambulatory Visit: Payer: Self-pay

## 2019-08-29 ENCOUNTER — Other Ambulatory Visit: Payer: Self-pay | Admitting: Family Medicine

## 2019-08-29 ENCOUNTER — Encounter: Payer: Self-pay | Admitting: Family Medicine

## 2019-08-29 VITALS — BP 186/77 | HR 60 | Temp 97.2°F | Resp 16 | Wt 123.6 lb

## 2019-08-29 DIAGNOSIS — F5101 Primary insomnia: Secondary | ICD-10-CM

## 2019-08-29 DIAGNOSIS — R609 Edema, unspecified: Secondary | ICD-10-CM

## 2019-08-29 DIAGNOSIS — E871 Hypo-osmolality and hyponatremia: Secondary | ICD-10-CM | POA: Diagnosis not present

## 2019-08-29 DIAGNOSIS — I10 Essential (primary) hypertension: Secondary | ICD-10-CM | POA: Diagnosis not present

## 2019-08-29 NOTE — Assessment & Plan Note (Deleted)
Elevated today.Tolerating Enalapril 10 mg qd, Metoprolol Tartrate 12.5 mg BID and feeling well. No palpitations, chest pains, dyspnea or significant edema with use of Torsemide 20 mg each morning. Weight stable.Continue supplements. (Boost) -Will check labs as below and f/u pending results.

## 2019-08-30 LAB — CBC WITH DIFFERENTIAL/PLATELET
Basophils Absolute: 0 10*3/uL (ref 0.0–0.2)
Basos: 1 %
EOS (ABSOLUTE): 0.1 10*3/uL (ref 0.0–0.4)
Eos: 1 %
Hematocrit: 37.5 % (ref 34.0–46.6)
Hemoglobin: 12.5 g/dL (ref 11.1–15.9)
Immature Grans (Abs): 0 10*3/uL (ref 0.0–0.1)
Immature Granulocytes: 0 %
Lymphocytes Absolute: 1.4 10*3/uL (ref 0.7–3.1)
Lymphs: 24 %
MCH: 32.1 pg (ref 26.6–33.0)
MCHC: 33.3 g/dL (ref 31.5–35.7)
MCV: 96 fL (ref 79–97)
Monocytes Absolute: 0.5 10*3/uL (ref 0.1–0.9)
Monocytes: 8 %
Neutrophils Absolute: 4.1 10*3/uL (ref 1.4–7.0)
Neutrophils: 66 %
Platelets: 160 10*3/uL (ref 150–450)
RBC: 3.9 x10E6/uL (ref 3.77–5.28)
RDW: 12.3 % (ref 11.7–15.4)
WBC: 6.1 10*3/uL (ref 3.4–10.8)

## 2019-08-30 LAB — COMPREHENSIVE METABOLIC PANEL
ALT: 7 IU/L (ref 0–32)
AST: 25 IU/L (ref 0–40)
Albumin/Globulin Ratio: 2.2 (ref 1.2–2.2)
Albumin: 4.9 g/dL — ABNORMAL HIGH (ref 3.5–4.6)
Alkaline Phosphatase: 107 IU/L (ref 48–121)
BUN/Creatinine Ratio: 24 (ref 12–28)
BUN: 28 mg/dL (ref 10–36)
Bilirubin Total: 0.4 mg/dL (ref 0.0–1.2)
CO2: 25 mmol/L (ref 20–29)
Calcium: 10.2 mg/dL (ref 8.7–10.3)
Chloride: 100 mmol/L (ref 96–106)
Creatinine, Ser: 1.19 mg/dL — ABNORMAL HIGH (ref 0.57–1.00)
GFR calc Af Amer: 46 mL/min/{1.73_m2} — ABNORMAL LOW (ref >59)
GFR calc non Af Amer: 40 mL/min/{1.73_m2} — ABNORMAL LOW (ref >59)
Globulin, Total: 2.2 g/dL (ref 1.5–4.5)
Glucose: 91 mg/dL (ref 65–99)
Potassium: 3.8 mmol/L (ref 3.5–5.2)
Sodium: 141 mmol/L (ref 134–144)
Total Protein: 7.1 g/dL (ref 6.0–8.5)

## 2019-09-01 ENCOUNTER — Telehealth: Payer: Self-pay | Admitting: *Deleted

## 2019-09-01 NOTE — Telephone Encounter (Signed)
Tried to call patient with results. Call was cut off and there was no answer with call back.

## 2019-09-01 NOTE — Telephone Encounter (Signed)
-----   Message from Margo Common, [place] sent at 09/01/2019 11:03 AM EDT ----- All blood tests in very good shape. Only slight variation in kidney function tests indicating need to drink plenty of water in diet. Recheck progress in 6 months or sooner if needed.

## 2019-09-24 ENCOUNTER — Other Ambulatory Visit: Payer: Self-pay | Admitting: Family Medicine

## 2019-09-24 DIAGNOSIS — G4700 Insomnia, unspecified: Secondary | ICD-10-CM

## 2019-10-18 ENCOUNTER — Other Ambulatory Visit: Payer: Self-pay | Admitting: Family Medicine

## 2019-10-18 DIAGNOSIS — I10 Essential (primary) hypertension: Secondary | ICD-10-CM

## 2019-11-03 ENCOUNTER — Other Ambulatory Visit: Payer: Self-pay | Admitting: Family Medicine

## 2019-11-03 DIAGNOSIS — I10 Essential (primary) hypertension: Secondary | ICD-10-CM

## 2019-11-23 ENCOUNTER — Telehealth: Payer: Self-pay | Admitting: Family Medicine

## 2019-11-23 NOTE — Telephone Encounter (Signed)
Copied from [place] [id number]. Topic: Medicare AWV >> Nov 23, 2019 10:40 AM Cher Nakai R wrote: Reason for CRM:   Left message for patient to call back and schedule Medicare Annual Wellness Visit (AWV) either virtually or in office.  Last AWV 11/30/2018  Please schedule at anytime with [facility] Health Advisor.  If any questions, please contact me at [phone]

## 2019-12-06 ENCOUNTER — Other Ambulatory Visit: Payer: Self-pay | Admitting: Family Medicine

## 2019-12-06 DIAGNOSIS — I10 Essential (primary) hypertension: Secondary | ICD-10-CM

## 2020-01-02 ENCOUNTER — Other Ambulatory Visit: Payer: Self-pay | Admitting: Family Medicine

## 2020-01-02 DIAGNOSIS — I10 Essential (primary) hypertension: Secondary | ICD-10-CM

## 2020-01-26 ENCOUNTER — Other Ambulatory Visit: Payer: Self-pay | Admitting: Family Medicine

## 2020-01-26 DIAGNOSIS — I10 Essential (primary) hypertension: Secondary | ICD-10-CM

## 2020-01-27 ENCOUNTER — Other Ambulatory Visit: Payer: Self-pay | Admitting: Family Medicine

## 2020-01-27 DIAGNOSIS — I10 Essential (primary) hypertension: Secondary | ICD-10-CM

## 2020-01-30 ENCOUNTER — Other Ambulatory Visit: Payer: Self-pay | Admitting: Family Medicine

## 2020-01-30 DIAGNOSIS — I10 Essential (primary) hypertension: Secondary | ICD-10-CM

## 2020-02-23 ENCOUNTER — Other Ambulatory Visit: Payer: Self-pay | Admitting: Family Medicine

## 2020-02-23 DIAGNOSIS — I10 Essential (primary) hypertension: Secondary | ICD-10-CM

## 2020-03-17 ENCOUNTER — Other Ambulatory Visit: Payer: Self-pay | Admitting: Family Medicine

## 2020-03-17 DIAGNOSIS — I10 Essential (primary) hypertension: Secondary | ICD-10-CM

## 2020-03-17 NOTE — Telephone Encounter (Signed)
Called pt and LM on Vm to call office to schedule appt Requested Prescriptions  Pending Prescriptions Disp Refills  . metoprolol tartrate (LOPRESSOR) 25 MG tablet [Pharmacy Med Name: METOPROLOL TARTRATE 25 MG TAB] 30 tablet 0    Sig: TAKE 0.5 TABLETS (12.5 MG TOTAL) BY MOUTH 2 (TWO) TIMES DAILY.     Cardiovascular:  Beta Blockers Failed - 03/17/2020  1:30 PM      Failed - Last BP in normal range    BP Readings from Last 1 Encounters:  08/29/19 (!) 186/77         Failed - Valid encounter within last 6 months    Recent Outpatient Visits          6 months ago Primary insomnia   [facility], [place], [place]   6 months ago Essential (primary) hypertension   [facility], [place]ki Muff, [place]   2 years ago Essential (primary) hypertension   [facility], [place]uff, [place]   2 years ago Hyponatremia   [facility], [place], [place]   2 years ago Peripheral edema   [place], [place] - Last Heart Rate in normal range    Pulse Readings from Last 1 Encounters:  08/29/19 60

## 2020-04-17 ENCOUNTER — Other Ambulatory Visit: Payer: Self-pay | Admitting: Family Medicine

## 2020-04-17 DIAGNOSIS — I10 Essential (primary) hypertension: Secondary | ICD-10-CM

## 2020-04-24 ENCOUNTER — Other Ambulatory Visit: Payer: Self-pay | Admitting: Family Medicine

## 2020-04-24 DIAGNOSIS — I10 Essential (primary) hypertension: Secondary | ICD-10-CM

## 2020-04-29 NOTE — Progress Notes (Signed)
Subjective:   [name] is a 87 y.o. female who presents for Medicare Annual (Subsequent) preventive examination.  I connected with Tiny Bartos today by telephone and verified that I am speaking with the correct person using two identifiers. Location patient: home Location provider: work Persons participating in the virtual visit: patient, provider.   I discussed the limitations, risks, security and privacy concerns of performing an evaluation and management service by telephone and the availability of in person appointments. I also discussed with the patient that there may be a patient responsible charge related to this service. The patient expressed understanding and verbally consented to this telephonic visit.    Interactive audio and video telecommunications were attempted between this provider and patient, however failed, due to patient having technical difficulties OR patient did not have access to video capability.  We continued and completed visit with audio only.   Review of Systems    N/A  Cardiac Risk Factors include: advanced age (>80mn, >>23women);hypertension     Objective:    There were no vitals filed for this visit. There is no height or weight on file to calculate BMI.  Advanced Directives 04/30/2020 11/30/2018 08/17/2017 07/06/2017 01/11/2017 07/14/2016 08/12/2015  Does Patient Have a Medical Advance Directive? Yes Yes Yes Yes Yes Yes -  Type of AParamedicof AMill[place]Living will [place]Living will [place]viewLiving will H[place]eneyLiving will [place]lor[place]Living will;Out of facility DNR (pink MOST or yellow form) HLogan CreekLiving will -  Does patient want to make changes to medical advance directive? - - No - Patient declined - No - Patient declined - -  Copy of HViningsin Chart? No - copy requested No - copy requested No - copy  requested No - copy requested No - copy requested No - copy requested -  Would patient like information on creating a medical advance directive? - - - - - - (No Data)  Pre-existing out of facility DNR order (yellow form or pink MOST form) - - - - Physician notified to receive inpatient order - -    Current Medications (verified) Outpatient Encounter Medications as of 04/30/2020  Medication Sig  . acetaminophen (TYLENOL) 325 MG tablet Take 2 tablets (650 mg total) by mouth every 6 (six) hours as needed for mild pain (or Fever >/= 101).  . enalapril (VASOTEC) 10 MG tablet TAKE 1 TABLET BY MOUTH EVERY DAY  . feeding supplement (BOOST / RESOURCE BREEZE) LIQD Take 1 Container by mouth 3 (three) times daily between meals.  . metoprolol tartrate (LOPRESSOR) 25 MG tablet TAKE 0.5 TABLETS (12.5 MG TOTAL) BY MOUTH 2 (TWO) TIMES DAILY.  .Marland Kitchentorsemide (DEMADEX) 20 MG tablet Take 20 mg by mouth every morning.  . traZODone (DESYREL) 100 MG tablet TAKE 1/2 TABLET ('50MG'$ ) BY MOUTH AT BEDTIME   No facility-administered encounter medications on file as of 04/30/2020.    Allergies (verified) Accolate [zafirlukast], Bactrim [sulfamethoxazole-trimethoprim], Metronidazole, Nitrofurantoin monohyd macro, Penicillins, and Keflex [cephalexin]   History: Past Medical History:  Diagnosis Date  . Breast cyst    removed  . Diverticulitis   . Gallstones   . Hypertension   . Low sodium levels    Past Surgical History:  Procedure Laterality Date  . ABDOMINAL HYSTERECTOMY  2000  . APPENDECTOMY  1940's  . bladder tack  1990's  . BREAST CYST INCISION AND DRAINAGE Left 1990's  . CHOLECYSTECTOMY  09/11/13  Cholangiogram suggested a retained stone ductal dilatation.  Marland Kitchen ERCP W/ SPHICTEROTOMY  09/12/2013   Recurrent stone identified post cholecystectomy.  Marland Kitchen NASAL SINUS SURGERY     Family History  Problem Relation Age of Onset  . Hypertension Father   . CAD Father   . Cancer Brother        unknown type   Social  History   Socioeconomic History  . Marital status: Widowed    Spouse name: Not on file  . Number of children: 1  . Years of education: Not on file  . Highest education level: 12th grade  Occupational History  . Occupation: retired  Tobacco Use  . Smoking status: Never Smoker  . Smokeless tobacco: Never Used  Vaping Use  . Vaping Use: Never used  Substance and Sexual Activity  . Alcohol use: No  . Drug use: No  . Sexual activity: Not on file  Other Topics Concern  . Not on file  Social History Narrative  . Not on file   Social Determinants of Health   Financial Resource Strain: Low Risk   . Difficulty of Paying Living Expenses: Not hard at all  Food Insecurity: No Food Insecurity  . Worried About Charity fundraiser in the Last Year: Never true  . Ran Out of Food in the Last Year: Never true  Transportation Needs: No Transportation Needs  . Lack of Transportation (Medical): No  . Lack of Transportation (Non-Medical): No  Physical Activity: Inactive  . Days of Exercise per Week: 0 days  . Minutes of Exercise per Session: 0 min  Stress: No Stress Concern Present  . Feeling of Stress : Not at all  Social Connections: Socially Isolated  . Frequency of Communication with Friends and Family: More than three times a week  . Frequency of Social Gatherings with Friends and Family: More than three times a week  . Attends Religious Services: Never  . Active Member of Clubs or Organizations: No  . Attends Archivist Meetings: Never  . Marital Status: Widowed    Tobacco Counseling Counseling given: Not Answered   Clinical Intake:  Pre-visit preparation completed: Yes  Pain : No/denies pain     Nutritional Risks: None Diabetes: No  How often do you need to have someone help you when you read instructions, pamphlets, or other written materials from your doctor or pharmacy?: 1 - Never  Diabetic? No  Interpreter Needed?: No  Information entered by ::  [facility], LPN   Activities of Daily Living In your present state of health, do you have any difficulty performing the following activities: 04/30/2020  Hearing? Y  Comment Does not wear hearing aids.  Vision? N  Difficulty concentrating or making decisions? N  Walking or climbing stairs? Y  Comment Avoids steps.  Dressing or bathing? N  Doing errands, shopping? Y  Comment Does not drive.  Preparing Food and eating ? N  Using the Toilet? N  In the past six months, have you accidently leaked urine? N  Do you have problems with loss of bowel control? N  Managing your Medications? N  Managing your Finances? N  Housekeeping or managing your Housekeeping? N  Some recent data might be hidden    Patient Care Team: Chrismon, Vickki Muff, PA-C as PCP - General (Family Medicine) Murlean Iba, MD (Nephrology)  Indicate any recent Medical Services you may have received from other than [facility] providers in the past year (date may be approximate).  Assessment:   This is a routine wellness examination for Tenille.  Hearing/Vision screen No exam data present  Dietary issues and exercise activities discussed: Current Exercise Habits: The patient does not participate in regular exercise at present, Exercise limited by: orthopedic condition(s)  Goals    . DIET - INCREASE WATER INTAKE     Recommend increasing water intake to 4-6 glasses a day.       Depression Screen PHQ 2/9 Scores 04/30/2020 11/30/2018 07/06/2017 07/14/2016 07/14/2016 07/24/2015 03/13/2015  PHQ - 2 Score 0 0 0 0 0 0 1  PHQ- 9 Score - - - 1 - - -    Fall Risk Fall Risk  04/30/2020 11/30/2018 07/06/2017 07/14/2016 07/24/2015  Falls in the past year? 0 0 No Yes Yes  Number falls in past yr: 0 - - 1 1  Injury with Fall? 0 - - Yes No  Comment - - - staples in head on 07/21/15 -  Risk for fall due to : - - - - History of fall(s)  Follow up - - - - Falls prevention discussed    FALL RISK PREVENTION PERTAINING TO THE HOME:  Any  stairs in or around the home? No  If so, are there any without handrails? No  Home free of loose throw rugs in walkways, pet beds, electrical cords, etc? Yes  Adequate lighting in your home to reduce risk of falls? Yes   ASSISTIVE DEVICES UTILIZED TO PREVENT FALLS:  Life alert? No  Use of a cane, walker or w/c? Yes  Grab bars in the bathroom? No  Shower chair or bench in shower? Yes  Elevated toilet seat or a handicapped toilet? No    Cognitive Function: Declined today.      6CIT Screen 07/06/2017 07/14/2016  What Year? 0 points 4 points  What month? 0 points 0 points  What time? 0 points 0 points  Count back from 20 0 points 0 points  Months in reverse 0 points 0 points  Repeat phrase 6 points 6 points  Total Score 6 10    Immunizations Immunization History  Administered Date(s) Administered  . Fluad Quad(high Dose 65+) 02/01/2020  . Influenza Split 01/13/2008, 01/25/2012  . Influenza, High Dose Seasonal PF 01/13/2017, 01/04/2018  . Influenza-Unspecified 12/12/2013  . Pneumococcal Conjugate-13 03/13/2015  . Pneumococcal Polysaccharide-23 04/09/2006  . Zoster 04/23/2006    TDAP status: Due, Education has been provided regarding the importance of this vaccine. Advised may receive this vaccine at local pharmacy or Health Dept. Aware to provide a copy of the vaccination record if obtained from local pharmacy or Health Dept. Verbalized acceptance and understanding.  Flu Vaccine status: Up to date  Pneumococcal vaccine status: Up to date  Covid-19 vaccine status: Completed vaccines  Qualifies for Shingles Vaccine? Yes   Zostavax completed Yes   Shingrix Completed?: No.    Education has been provided regarding the importance of this vaccine. Patient has been advised to call insurance company to determine out of pocket expense if they have not yet received this vaccine. Advised may also receive vaccine at local pharmacy or Health Dept. Verbalized acceptance and  understanding.  Screening Tests Health Maintenance  Topic Date Due  . COVID-19 Vaccine (1) Never done  . DEXA SCAN  02/17/2014  . TETANUS/TDAP  04/30/2021 (Originally 02/03/1944)  . INFLUENZA VACCINE  Completed  . PNA vac Low Risk Adult  Completed    Health Maintenance  Health Maintenance Due  Topic Date Due  . COVID-19 Vaccine (  1) Never done  . DEXA SCAN  02/17/2014    Colorectal cancer screening: No longer required.   Mammogram status: No longer required due to age.  Bone Density status: Currently due, declined an order at this time.   Lung Cancer Screening: (Low Dose CT Chest recommended if Age 34-80 years, 30 pack-year currently smoking OR have quit w/in 15years.) does not qualify.   Additional Screening:  Vision Screening: Recommended annual ophthalmology exams for early detection of glaucoma and other disorders of the eye. Is the patient up to date with their annual eye exam?  Yes  Who is the provider or what is the name of the office in which the patient attends annual eye exams? Dr [facility] in [place], [place] If pt is not established with a provider, would they like to be referred to a provider to establish care? No .   Dental Screening: Recommended annual dental exams for proper oral hygiene  Community Resource Referral / Chronic Care Management: CRR required this visit?  No   CCM required this visit?  No      Plan:     I have personally reviewed and noted the following in the patient's chart:   . Medical and social history . Use of alcohol, tobacco or illicit drugs  . Current medications and supplements . Functional ability and status . Nutritional status . Physical activity . Advanced directives . List of other physicians . Hospitalizations, surgeries, and ER visits in previous 12 months . Vitals . Screenings to include cognitive, depression, and falls . Referrals and appointments  In addition, I have reviewed and discussed with patient certain  preventive protocols, quality metrics, and best practice recommendations. A written personalized care plan for preventive services as well as general preventive health recommendations were provided to patient.     Deante Blough [place], [place]   [id number]   Nurse Notes: Pt states she has received the Covid vaccines. Requested she bring the vaccine card into next in office apt. Pt declined a DEXA order at this time.

## 2020-04-30 ENCOUNTER — Other Ambulatory Visit: Payer: Self-pay

## 2020-04-30 ENCOUNTER — Ambulatory Visit (INDEPENDENT_AMBULATORY_CARE_PROVIDER_SITE_OTHER): Payer: PPO

## 2020-04-30 DIAGNOSIS — Z0000 Encounter for general adult medical examination without abnormal findings: Secondary | ICD-10-CM

## 2020-04-30 NOTE — Patient Instructions (Addendum)
[name] , Thank you for taking time to come for your Medicare Wellness Visit. I appreciate your ongoing commitment to your health goals. Please review the following plan we discussed and let me know if I can assist you in the future.   Screening recommendations/referrals: Colonoscopy: No longer required.  Mammogram: No longer required.  Bone Density: Currently due, declined order today.  Recommended yearly ophthalmology/optometry visit for glaucoma screening and checkup Recommended yearly dental visit for hygiene and checkup  Vaccinations: Influenza vaccine: Done 02/01/20 Pneumococcal vaccine: Completed series Tdap vaccine: Currently due, declined receiving. Shingles vaccine: Shingrix discussed. Please contact your pharmacy for coverage information.     Advanced directives: Please bring a copy of your POA (Power of Attorney) and/or Living Will to your next appointment.   Conditions/risks identified: Recommend to increase water intake to 6-8 8 oz glasses a day.   Next appointment: 05/06/21 @ 2:40 PM for an AWV. Declined scheduling a follow up with PCP at this time.    Preventive Care 35 Years and Older, Female Preventive care refers to lifestyle choices and visits with your health care provider that can promote health and wellness. What does preventive care include?  A yearly physical exam. This is also called an annual well check.  Dental exams once or twice a year.  Routine eye exams. Ask your health care provider how often you should have your eyes checked.  Personal lifestyle choices, including:  Daily care of your teeth and gums.  Regular physical activity.  Eating a healthy diet.  Avoiding tobacco and drug use.  Limiting alcohol use.  Practicing safe sex.  Taking low-dose aspirin every day.  Taking vitamin and mineral supplements as recommended by your health care provider. What happens during an annual well check? The services and screenings done by your  health care provider during your annual well check will depend on your age, overall health, lifestyle risk factors, and family history of disease. Counseling  Your health care provider may ask you questions about your:  Alcohol use.  Tobacco use.  Drug use.  Emotional well-being.  Home and relationship well-being.  Sexual activity.  Eating habits.  History of falls.  Memory and ability to understand (cognition).  Work and work Statistician.  Reproductive health. Screening  You may have the following tests or measurements:  Height, weight, and BMI.  Blood pressure.  Lipid and cholesterol levels. These may be checked every 5 years, or more frequently if you are over 57 years old.  Skin check.  Lung cancer screening. You may have this screening every year starting at age 8 if you have a 30-pack-year history of smoking and currently smoke or have quit within the past 15 years.  Fecal occult blood test (FOBT) of the stool. You may have this test every year starting at age 82.  Flexible sigmoidoscopy or colonoscopy. You may have a sigmoidoscopy every 5 years or a colonoscopy every 10 years starting at age 65.  Hepatitis C blood test.  Hepatitis B blood test.  Sexually transmitted disease (STD) testing.  Diabetes screening. This is done by checking your blood sugar (glucose) after you have not eaten for a while (fasting). You may have this done every 1-3 years.  Bone density scan. This is done to screen for osteoporosis. You may have this done starting at age 7.  Mammogram. This may be done every 1-2 years. Talk to your health care provider about how often you should have regular mammograms. Talk with your health  care provider about your test results, treatment options, and if necessary, the need for more tests. Vaccines  Your health care provider may recommend certain vaccines, such as:  Influenza vaccine. This is recommended every year.  Tetanus, diphtheria, and  acellular pertussis (Tdap, Td) vaccine. You may need a Td booster every 10 years.  Zoster vaccine. You may need this after age 16.  Pneumococcal 13-valent conjugate (PCV13) vaccine. One dose is recommended after age 35.  Pneumococcal polysaccharide (PPSV23) vaccine. One dose is recommended after age 65. Talk to your health care provider about which screenings and vaccines you need and how often you need them. This information is not intended to replace advice given to you by your health care provider. Make sure you discuss any questions you have with your health care provider. Document Released: 04/26/2015 Document Revised: 12/18/2015 Document Reviewed: 01/29/2015 Elsevier Interactive Patient Education  2017 [employer] Prevention in the Home Falls can cause injuries. They can happen to people of all ages. There are many things you can do to make your home safe and to help prevent falls. What can I do on the outside of my home?  Regularly fix the edges of walkways and driveways and fix any cracks.  Remove anything that might make you trip as you walk through a door, such as a raised step or threshold.  Trim any bushes or trees on the path to your home.  Use bright outdoor lighting.  Clear any walking paths of anything that might make someone trip, such as rocks or tools.  Regularly check to see if handrails are loose or broken. Make sure that both sides of any steps have handrails.  Any raised decks and porches should have guardrails on the edges.  Have any leaves, snow, or ice cleared regularly.  Use sand or salt on walking paths during winter.  Clean up any spills in your garage right away. This includes oil or grease spills. What can I do in the bathroom?  Use night lights.  Install grab bars by the toilet and in the tub and shower. Do not use towel bars as grab bars.  Use non-skid mats or decals in the tub or shower.  If you need to sit down in the shower, use  a plastic, non-slip stool.  Keep the floor dry. Clean up any water that spills on the floor as soon as it happens.  Remove soap buildup in the tub or shower regularly.  Attach bath mats securely with double-sided non-slip rug tape.  Do not have throw rugs and other things on the floor that can make you trip. What can I do in the bedroom?  Use night lights.  Make sure that you have a light by your bed that is easy to reach.  Do not use any sheets or blankets that are too big for your bed. They should not hang down onto the floor.  Have a firm chair that has side arms. You can use this for support while you get dressed.  Do not have throw rugs and other things on the floor that can make you trip. What can I do in the kitchen?  Clean up any spills right away.  Avoid walking on wet floors.  Keep items that you use a lot in easy-to-reach places.  If you need to reach something above you, use a strong step stool that has a grab bar.  Keep electrical cords out of the way.  Do not use floor  polish or wax that makes floors slippery. If you must use wax, use non-skid floor wax.  Do not have throw rugs and other things on the floor that can make you trip. What can I do with my stairs?  Do not leave any items on the stairs.  Make sure that there are handrails on both sides of the stairs and use them. Fix handrails that are broken or loose. Make sure that handrails are as long as the stairways.  Check any carpeting to make sure that it is firmly attached to the stairs. Fix any carpet that is loose or worn.  Avoid having throw rugs at the top or bottom of the stairs. If you do have throw rugs, attach them to the floor with carpet tape.  Make sure that you have a light switch at the top of the stairs and the bottom of the stairs. If you do not have them, ask someone to add them for you. What else can I do to help prevent falls?  Wear shoes that:  Do not have high heels.  Have  rubber bottoms.  Are comfortable and fit you well.  Are closed at the toe. Do not wear sandals.  If you use a stepladder:  Make sure that it is fully opened. Do not climb a closed stepladder.  Make sure that both sides of the stepladder are locked into place.  Ask someone to hold it for you, if possible.  Clearly mark and make sure that you can see:  Any grab bars or handrails.  First and last steps.  Where the edge of each step is.  Use tools that help you move around (mobility aids) if they are needed. These include:  Canes.  Walkers.  Scooters.  Crutches.  Turn on the lights when you go into a dark area. Replace any light bulbs as soon as they burn out.  Set up your furniture so you have a clear path. Avoid moving your furniture around.  If any of your floors are uneven, fix them.  If there are any pets around you, be aware of where they are.  Review your medicines with your doctor. Some medicines can make you feel dizzy. This can increase your chance of falling. Ask your doctor what other things that you can do to help prevent falls. This information is not intended to replace advice given to you by your health care provider. Make sure you discuss any questions you have with your health care provider. Document Released: 01/24/2009 Document Revised: 09/05/2015 Document Reviewed: 05/04/2014 Elsevier Interactive Patient Education  2017 [employer].

## 2020-05-17 ENCOUNTER — Other Ambulatory Visit: Payer: Self-pay | Admitting: Family Medicine

## 2020-05-17 DIAGNOSIS — I10 Essential (primary) hypertension: Secondary | ICD-10-CM

## 2020-05-28 ENCOUNTER — Other Ambulatory Visit: Payer: Self-pay | Admitting: Family Medicine

## 2020-05-28 DIAGNOSIS — I10 Essential (primary) hypertension: Secondary | ICD-10-CM

## 2020-06-14 ENCOUNTER — Telehealth: Payer: Self-pay | Admitting: Family Medicine

## 2020-06-14 ENCOUNTER — Other Ambulatory Visit: Payer: Self-pay | Admitting: Family Medicine

## 2020-06-14 DIAGNOSIS — I10 Essential (primary) hypertension: Secondary | ICD-10-CM

## 2020-06-14 NOTE — Telephone Encounter (Signed)
Pt granddaughter robin is calling and pt did give verbal consent that is ok to talk with granddaugter . Pt granddaughter would like a callback to make sure grandma is taking her medication correctly

## 2020-06-14 NOTE — Telephone Encounter (Signed)
Refill request for Enalapril; courtesy refill given 05/17/20; no valid encounter within last 6 months; no upcoming visit noted; attempted to contact the pt or her representative; spoke with daughter in law Grady and she is concerned this is a scam; she was instructed to call the office to discuss this matter; her daughter in law verbalized understanding; refill denied.

## 2020-06-14 NOTE — Telephone Encounter (Signed)
Patient just lost her husband.  He was basically her care giver.  Her daughter is concerned that she has not been taking her medication as she should.  Reviewed all of her medications and how she should take them.  It appears patient had been doing well until January.  During January and February (which is probably because of her husbands failing health) she may have missed half her Enalapril and Metoprolol based on when it was last filled and the number of pills she has left.  Daughter is also concerned about patient cognition and would like that checked.   Appointment has been made and daughter will accompany patient to visit.

## 2020-06-21 ENCOUNTER — Other Ambulatory Visit: Payer: Self-pay

## 2020-06-21 ENCOUNTER — Encounter: Payer: Self-pay | Admitting: Adult Health

## 2020-06-21 ENCOUNTER — Other Ambulatory Visit: Payer: Self-pay | Admitting: Family Medicine

## 2020-06-21 ENCOUNTER — Ambulatory Visit (INDEPENDENT_AMBULATORY_CARE_PROVIDER_SITE_OTHER): Payer: PPO | Admitting: Adult Health

## 2020-06-21 DIAGNOSIS — I10 Essential (primary) hypertension: Secondary | ICD-10-CM | POA: Diagnosis not present

## 2020-06-21 DIAGNOSIS — G4700 Insomnia, unspecified: Secondary | ICD-10-CM

## 2020-06-21 DIAGNOSIS — D649 Anemia, unspecified: Secondary | ICD-10-CM | POA: Diagnosis not present

## 2020-06-21 MED ORDER — ENALAPRIL MALEATE 10 MG PO TABS: 10.0000 mg | ORAL_TABLET | Freq: Every day | ORAL | 0 refills | Status: DC

## 2020-06-21 MED ORDER — METOPROLOL TARTRATE 25 MG PO TABS
12.5000 mg | ORAL_TABLET | Freq: Two times a day (BID) | ORAL | 0 refills | Status: DC
Start: 2020-06-21 — End: 2020-07-14

## 2020-06-21 MED ORDER — TRAZODONE HCL 50 MG PO TABS: 25.0000 mg | ORAL_TABLET | Freq: Every evening | ORAL | 0 refills | Status: DC | PRN

## 2020-06-21 NOTE — Progress Notes (Signed)
Established patient visit   Patient: [name]   DOB: 05-08-1924   87 y.o. Female  MRN: [id number] Visit Date: 06/21/2020  Today's healthcare provider: Marcille Buffy, FNP   Chief Complaint  Patient presents with  . Hypertension   Subjective    HPI  Hypertension, follow-up  BP Readings from Last 3 Encounters:  06/21/20 (!) 181/55  08/29/19 (!) 186/77  01/04/18 110/66   Wt Readings from Last 3 Encounters:  06/21/20 121 lb 12.8 oz (55.2 kg)  08/29/19 123 lb 9.6 oz (56.1 kg)  01/04/18 126 lb (57.2 kg)     She was last seen for hypertension 6 months ago.  BP at that visit was 186/77. Management since that visit includes none, blood pressure elevated at last visit, patient reported good compliance on medication.  She reports good compliance with treatment. She is not having side effects.  She is following a Regular diet. She is not exercising. She does not smoke.  Use of agents associated with hypertension: none.   She stopped her Torosemide 20 mg once daily, she has not been taking.  Outside blood pressures are not checked. Patient  denies any fever, body aches,chills, rash, chest pain, shortness of breath, nausea, vomiting, or diarrhea.  Denies dizziness, lightheadedness, pre syncopal or syncopal episodes.   Symptoms: No chest pain No chest pressure  No palpitations No syncope  No dyspnea No orthopnea  No paroxysmal nocturnal dyspnea No lower extremity edema   Pertinent labs: Lab Results  Component Value Date   CHOL 177 01/14/2016   HDL 62 01/14/2016   LDLCALC 89 01/14/2016   TRIG 128 01/14/2016   CHOLHDL 2.9 01/14/2016   Lab Results  Component Value Date   NA 141 06/21/2020   K 4.4 06/21/2020   CREATININE 1.28 (H) 06/21/2020   GFRNONAA 40 (L) 08/29/2019   GFRAA 46 (L) 08/29/2019   GLUCOSE 94 06/21/2020     The ASCVD Risk score (Goff DC Jr., et al., 2013) failed to calculate for the following reasons:   The 2013 ASCVD risk score is  only valid for ages 47 to 6   ---------------------------------------------------------------------------------------------------  Patient Active Problem List   Diagnosis Date Noted  . Syncope 08/17/2017  . Hyponatremia 01/11/2017  . Arthritis of both hands 01/14/2016  . Acute UTI 08/21/2015  . Scalp laceration 07/29/2015  . Fall 07/29/2015  . Near syncope 07/21/2015  . Foot pain 12/31/2014  . Anxiety 10/17/2014  . Allergic rhinitis 10/16/2014  . Airway hyperreactivity 10/16/2014  . Biliary calculi, common bile duct 10/16/2014  . Clinical depression 10/16/2014  . 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 Clicking shoulder 43/83/7793  . Essential (primary) hypertension 10/16/2014  . H/O gastrointestinal hemorrhage 10/16/2014  . 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 Below normal amount of sodium in the blood 10/16/2014  . Insomnia 10/16/2014  . Bad memory 10/16/2014  . OP (osteoporosis) 10/16/2014  . Cyst of pancreas 10/16/2014  . Temporary cerebral vascular dysfunction 10/16/2014  . Other constipation 03/07/2014  . Right groin pain 03/07/2014  . Internal hemorrhoid 03/07/2014  . Gallstones, common bile duct 08/29/2013  . Cholelithiasis 08/29/2013  . CYST AND PSEUDOCYST OF PANCREAS 03/12/2008   Past Medical History:  Diagnosis Date  . Breast cyst    removed  . Diverticulitis   . Gallstones   . Hypertension   . Low sodium levels    Allergies  Allergen Reactions  . Accolate [Zafirlukast] Other (See Comments)    Reaction: unknown  .  Bactrim [Sulfamethoxazole-Trimethoprim] Nausea And Vomiting  . Metronidazole Other (See Comments)    Reaction: unknown  . Nitrofurantoin Monohyd Macro Other (See Comments)    GI upset  . Penicillins Diarrhea and Other (See Comments)    Has patient had a PCN reaction causing immediate rash, facial/tongue/throat swelling, SOB or lightheadedness with hypotension: Unknown Has patient had a PCN reaction causing severe rash involving mucus  membranes or skin necrosis: Unknown Has patient had a PCN reaction that required hospitalization: Unknown Has patient had a PCN reaction occurring within the last 10 years: Unknown If all of the above answers are "NO", then may proceed with Cephalosporin use.  Marland Kitchen Keflex [Cephalexin] Rash       Medications: Outpatient Medications Prior to Visit  Medication Sig  . acetaminophen (TYLENOL) 325 MG tablet Take 2 tablets (650 mg total) by mouth every 6 (six) hours as needed for mild pain (or Fever >/= 101).  . feeding supplement (BOOST / RESOURCE BREEZE) LIQD Take 1 Container by mouth 3 (three) times daily between meals.  . torsemide (DEMADEX) 20 MG tablet Take 20 mg by mouth every morning.  . [DISCONTINUED] enalapril (VASOTEC) 10 MG tablet TAKE 1 TABLET BY MOUTH EVERY DAY  . [DISCONTINUED] metoprolol tartrate (LOPRESSOR) 25 MG tablet TAKE 1/2 TABLET BY MOUTH TWICE A DAY  . [DISCONTINUED] traZODone (DESYREL) 100 MG tablet TAKE 1/2 TABLET (50MG) BY MOUTH AT BEDTIME   No facility-administered medications prior to visit.    Review of Systems  Constitutional: Positive for fatigue. Negative for appetite change, chills, diaphoresis, fever and unexpected weight change.  HENT: Negative.   Respiratory: Negative.   Cardiovascular: Negative.   Gastrointestinal: Positive for abdominal distention.  Genitourinary: Negative.   Musculoskeletal: Negative.        Objective    BP (!) 181/55   Pulse (!) 59   Resp 15   Wt 121 lb 12.8 oz (55.2 kg)   SpO2 93%   BMI 20.91 kg/m  BP Readings from Last 3 Encounters:  06/21/20 (!) 181/55  08/29/19 (!) 186/77  01/04/18 110/66   Wt Readings from Last 3 Encounters:  06/21/20 121 lb 12.8 oz (55.2 kg)  08/29/19 123 lb 9.6 oz (56.1 kg)  01/04/18 126 lb (57.2 kg)       Physical Exam Vitals reviewed.  Constitutional:      General: She is not in acute distress.    Appearance: Normal appearance. She is not ill-appearing, toxic-appearing or diaphoretic.   HENT:     Head: Normocephalic and atraumatic.     Right Ear: Tympanic membrane, ear canal and external ear normal. There is no impacted cerumen.     Left Ear: Tympanic membrane, ear canal and external ear normal. There is no impacted cerumen.     Nose: Nose normal. No congestion or rhinorrhea.     Mouth/Throat:     Mouth: Mucous membranes are moist.     Pharynx: No oropharyngeal exudate or posterior oropharyngeal erythema.  Eyes:     General: No scleral icterus.       Right eye: No discharge.        Left eye: No discharge.     Extraocular Movements: Extraocular movements intact.     Conjunctiva/sclera: Conjunctivae normal.     Pupils: Pupils are equal, round, and reactive to light.  Neck:     Vascular: No carotid bruit.  Cardiovascular:     Rate and Rhythm: Normal rate and regular rhythm.     Pulses: Normal pulses.  Heart sounds: Normal heart sounds.  Pulmonary:     Effort: Pulmonary effort is normal. No respiratory distress.     Breath sounds: Normal breath sounds. No stridor. No wheezing, rhonchi or rales.  Chest:     Chest wall: No tenderness.  Abdominal:     General: Bowel sounds are normal. There is no distension.     Palpations: There is no mass.     Tenderness: There is no abdominal tenderness. There is no right CVA tenderness, left CVA tenderness, guarding or rebound.     Hernia: No hernia is present.  Musculoskeletal:        General: Normal range of motion.     Cervical back: Normal range of motion and neck supple. No rigidity or tenderness.     Right lower leg: Edema present.     Left lower leg: Edema present.  Lymphadenopathy:     Cervical: No cervical adenopathy.  Skin:    General: Skin is warm.     Findings: No erythema.  Neurological:     Mental Status: She is alert and oriented to person, place, and time.  Psychiatric:        Mood and Affect: Mood normal.        Thought Content: Thought content normal.        Judgment: Judgment normal.     Results  for orders placed or performed in visit on 06/21/20  CBC with Differential/Platelet  Result Value Ref Range   WBC 6.9 3.4 - 10.8 x10E3/uL   RBC 3.32 (L) 3.77 - 5.28 x10E6/uL   Hemoglobin 11.0 (L) 11.1 - 15.9 g/dL   Hematocrit 31.0 (L) 34.0 - 46.6 %   MCV 93 79 - 97 fL   MCH 33.1 (H) 26.6 - 33.0 pg   MCHC 35.5 31.5 - 35.7 g/dL   RDW 11.8 11.7 - 15.4 %   Platelets 196 150 - 450 x10E3/uL   Neutrophils 70 Not Estab. %   Lymphs 20 Not Estab. %   Monocytes 8 Not Estab. %   Eos 1 Not Estab. %   Basos 0 Not Estab. %   Neutrophils Absolute 4.8 1.4 - 7.0 x10E3/uL   Lymphocytes Absolute 1.4 0.7 - 3.1 x10E3/uL   Monocytes Absolute 0.6 0.1 - 0.9 x10E3/uL   EOS (ABSOLUTE) 0.1 0.0 - 0.4 x10E3/uL   Basophils Absolute 0.0 0.0 - 0.2 x10E3/uL   Immature Granulocytes 1 Not Estab. %   Immature Grans (Abs) 0.0 0.0 - 0.1 x10E3/uL  Comprehensive metabolic panel  Result Value Ref Range   Glucose 94 65 - 99 mg/dL   BUN 27 10 - 36 mg/dL   Creatinine, Ser 1.28 (H) 0.57 - 1.00 mg/dL   eGFR 39 (L) >59 mL/min/1.73   BUN/Creatinine Ratio 21 12 - 28   Sodium 141 134 - 144 mmol/L   Potassium 4.4 3.5 - 5.2 mmol/L   Chloride 104 96 - 106 mmol/L   CO2 22 20 - 29 mmol/L   Calcium 9.6 8.7 - 10.3 mg/dL   Total Protein 6.6 6.0 - 8.5 g/dL   Albumin 4.1 3.5 - 4.6 g/dL   Globulin, Total 2.5 1.5 - 4.5 g/dL   Albumin/Globulin Ratio 1.6 1.2 - 2.2   Bilirubin Total 0.2 0.0 - 1.2 mg/dL   Alkaline Phosphatase 118 44 - 121 IU/L   AST 18 0 - 40 IU/L   ALT 7 0 - 32 IU/L  TSH  Result Value Ref Range   TSH 1.270 0.450 - 4.500 uIU/mL  B Nat Peptide  Result Value Ref Range   BNP 264.1 (H) 0.0 - 100.0 pg/mL    Assessment & Plan     Insomnia, unspecified type  Essential (primary) hypertension - Plan: enalapril (VASOTEC) 10 MG tablet, CBC with Differential/Platelet, Comprehensive metabolic panel, TSH, B Nat Peptide  Start Torsemide back as previously prescribed.   Red Flags discussed. The patient was given clear  instructions to go to ER or return to medical center if any red flags develop, symptoms do not improve, worsen or new problems develop. They verbalized understanding.   Return in about 2 weeks (around 07/05/2020), or if symptoms worsen or fail to improve, for at any time for any worsening symptoms, Go to Emergency room/ urgent care if worse.     The entirety of the information documented in the History of Present Illness, Review of Systems and Physical Exam were personally obtained by me. Portions of this information were initially documented by the CMA and reviewed by me for thoroughness and accuracy.   Red Flags discussed. The patient was given clear instructions to go to ER or return to medical center if any red flags develop, symptoms do not improve, worsen or new problems develop. They verbalized understanding. The entirety of the information documented in the History of Present Illness, Review of Systems and Physical Exam were personally obtained by me. Portions of this information were initially documented by the CMA and reviewed by me for thoroughness and accuracy.      Marcille Buffy, [place] [phone] (phone) [phone] (fax)  [place]

## 2020-06-21 NOTE — Telephone Encounter (Signed)
Requested Prescriptions  Pending Prescriptions Disp Refills  . metoprolol tartrate (LOPRESSOR) 25 MG tablet [Pharmacy Med Name: METOPROLOL TARTRATE 25 MG TAB] 30 tablet 0    Sig: TAKE 1/2 TABLET BY MOUTH TWICE A DAY     Cardiovascular:  Beta Blockers Failed - 06/21/2020  8:30 AM      Failed - Last BP in normal range    BP Readings from Last 1 Encounters:  08/29/19 (!) 186/77         Failed - Valid encounter within last 6 months    Recent Outpatient Visits          9 months ago Primary insomnia   [facility], Vickki Muff, PA-C   10 months ago Essential (primary) hypertension   [facility], Vickki Muff, PA-C   2 years ago Essential (primary) hypertension   [facility], Vickki Muff, PA-C   2 years ago Hyponatremia   [facility], Vickki Muff, PA-C   2 years ago Peripheral edema   [facility], Vickki Muff, PA-C      Future Appointments            Today [facility], Kelby Aline, [place], [facility]           Passed - Last Heart Rate in normal range    Pulse Readings from Last 1 Encounters:  08/29/19 60

## 2020-06-21 NOTE — Patient Instructions (Signed)
Hypertension, Adult Hypertension is another name for high blood pressure. High blood pressure forces your heart to work harder to pump blood. This can cause problems over time. There are two numbers in a blood pressure reading. There is a top number (systolic) over a bottom number (diastolic). It is best to have a blood pressure that is below 120/80. Healthy choices can help lower your blood pressure, or you may need medicine to help lower it. What are the causes? The cause of this condition is not known. Some conditions may be related to high blood pressure. What increases the risk?  Smoking.  Having type 2 diabetes mellitus, high cholesterol, or both.  Not getting enough exercise or physical activity.  Being overweight.  Having too much fat, sugar, calories, or salt (sodium) in your diet.  Drinking too much alcohol.  Having long-term (chronic) kidney disease.  Having a family history of high blood pressure.  Age. Risk increases with age.  Race. You may be at higher risk if you are African American.  Gender. Men are at higher risk than women before age 45. After age 65, women are at higher risk than men.  Having obstructive sleep apnea.  Stress. What are the signs or symptoms?  High blood pressure may not cause symptoms. Very high blood pressure (hypertensive crisis) may cause: ? Headache. ? Feelings of worry or nervousness (anxiety). ? Shortness of breath. ? Nosebleed. ? A feeling of being sick to your stomach (nausea). ? Throwing up (vomiting). ? Changes in how you see. ? Very bad chest pain. ? Seizures. How is this treated?  This condition is treated by making healthy lifestyle changes, such as: ? Eating healthy foods. ? Exercising more. ? Drinking less alcohol.  Your health care provider may prescribe medicine if lifestyle changes are not enough to get your blood pressure under control, and if: ? Your top number is above 130. ? Your bottom number is above  80.  Your personal target blood pressure may vary. Follow these instructions at home: Eating and drinking  If told, follow the DASH eating plan. To follow this plan: ? Fill one half of your plate at each meal with fruits and vegetables. ? Fill one fourth of your plate at each meal with whole grains. Whole grains include whole-wheat pasta, brown rice, and whole-grain bread. ? Eat or drink low-fat dairy products, such as skim milk or low-fat yogurt. ? Fill one fourth of your plate at each meal with low-fat (lean) proteins. Low-fat proteins include fish, chicken without skin, eggs, beans, and tofu. ? Avoid fatty meat, cured and processed meat, or chicken with skin. ? Avoid pre-made or processed food.  Eat less than 1,500 mg of salt each day.  Do not drink alcohol if: ? Your doctor tells you not to drink. ? You are pregnant, may be pregnant, or are planning to become pregnant.  If you drink alcohol: ? Limit how much you use to:  0-1 drink a day for women.  0-2 drinks a day for men. ? Be aware of how much alcohol is in your drink. In the [place] one drink equals one 12 oz bottle of beer (355 mL), one 5 oz glass of wine (148 mL), or one 1 oz glass of hard liquor (44 mL).   Lifestyle  Work with your doctor to stay at a healthy weight or to lose weight. Ask your doctor what the best weight is for you.  Get at least 30 minutes of exercise most   days of the week. This may include walking, swimming, or biking.  Get at least 30 minutes of exercise that strengthens your muscles (resistance exercise) at least 3 days a week. This may include lifting weights or doing Pilates.  Do not use any products that contain nicotine or tobacco, such as cigarettes, e-cigarettes, and chewing tobacco. If you need help quitting, ask your doctor.  Check your blood pressure at home as told by your doctor.  Keep all follow-up visits as told by your doctor. This is important.   Medicines  Take over-the-counter  and prescription medicines only as told by your doctor. Follow directions carefully.  Do not skip doses of blood pressure medicine. The medicine does not work as well if you skip doses. Skipping doses also puts you at risk for problems.  Ask your doctor about side effects or reactions to medicines that you should watch for. Contact a doctor if you:  Think you are having a reaction to the medicine you are taking.  Have headaches that keep coming back (recurring).  Feel dizzy.  Have swelling in your ankles.  Have trouble with your vision. Get help right away if you:  Get a very bad headache.  Start to feel mixed up (confused).  Feel weak or numb.  Feel faint.  Have very bad pain in your: ? Chest. ? Belly (abdomen).  Throw up more than once.  Have trouble breathing. Summary  Hypertension is another name for high blood pressure.  High blood pressure forces your heart to work harder to pump blood.  For most people, a normal blood pressure is less than 120/80.  Making healthy choices can help lower blood pressure. If your blood pressure does not get lower with healthy choices, you may need to take medicine. This information is not intended to replace advice given to you by your health care provider. Make sure you discuss any questions you have with your health care provider. Document Revised: 12/08/2017 Document Reviewed: 12/08/2017 Elsevier Patient Education  2021 [employer]. Trazodone Tablets What is this medicine? TRAZODONE (TRAZ oh done) is used to treat depression. This medicine may be used for other purposes; ask your health care provider or pharmacist if you have questions. COMMON BRAND NAME(S): Desyrel What should I tell my health care provider before I take this medicine? They need to know if you have any of these conditions:  attempted suicide or thinking about it  bipolar disorder  bleeding problems  glaucoma  heart disease, or previous heart  attack  irregular heart beat  kidney or liver disease  low levels of sodium in the blood  an unusual or allergic reaction to trazodone, other medicines, foods, dyes or preservatives  pregnant or trying to get pregnant  breast-feeding How should I use this medicine? Take this medicine by mouth with a glass of water. Follow the directions on the prescription label. Take this medicine shortly after a meal or a light snack. Take your medicine at regular intervals. Do not take your medicine more often than directed. Do not stop taking this medicine suddenly except upon the advice of your doctor. Stopping this medicine too quickly may cause serious side effects or your condition may worsen. A special MedGuide will be given to you by the pharmacist with each prescription and refill. Be sure to read this information carefully each time. Talk to your pediatrician regarding the use of this medicine in children. Special care may be needed. Overdosage: If you think you have taken too  much of this medicine contact a poison control center or emergency room at once. NOTE: This medicine is only for you. Do not share this medicine with others. What if I miss a dose? If you miss a dose, take it as soon as you can. If it is almost time for your next dose, take only that dose. Do not take double or extra doses. What may interact with this medicine? Do not take this medicine with any of the following medications:  certain medicines for fungal infections like fluconazole, itraconazole, ketoconazole, posaconazole, voriconazole  cisapride  dronedarone  linezolid  MAOIs like Carbex, Eldepryl, Marplan, Nardil, and Parnate  mesoridazine  methylene blue (injected into a vein)  pimozide  saquinavir  thioridazine This medicine may also interact with the following medications:  alcohol  antiviral medicines for HIV or AIDS  aspirin and aspirin-like medicines  barbiturates like  phenobarbital  certain medicines for blood pressure, heart disease, irregular heart beat  certain medicines for depression, anxiety, or psychotic disturbances  certain medicines for migraine headache like almotriptan, eletriptan, frovatriptan, naratriptan, rizatriptan, sumatriptan, zolmitriptan  certain medicines for seizures like carbamazepine and phenytoin  certain medicines for sleep  certain medicines that treat or prevent blood clots like dalteparin, enoxaparin, warfarin  digoxin  fentanyl  lithium  NSAIDS, medicines for pain and inflammation, like ibuprofen or naproxen  other medicines that prolong the QT interval (cause an abnormal heart rhythm) like dofetilide  rasagiline  supplements like St. John's wort, kava kava, valerian  tramadol  tryptophan This list may not describe all possible interactions. Give your health care provider a list of all the medicines, herbs, non-prescription drugs, or dietary supplements you use. Also tell them if you smoke, drink alcohol, or use illegal drugs. Some items may interact with your medicine. What should I watch for while using this medicine? Tell your doctor if your symptoms do not get better or if they get worse. Visit your doctor or health care professional for regular checks on your progress. Because it may take several weeks to see the full effects of this medicine, it is important to continue your treatment as prescribed by your doctor. Patients and their families should watch out for new or worsening thoughts of suicide or depression. Also watch out for sudden changes in feelings such as feeling anxious, agitated, panicky, irritable, hostile, aggressive, impulsive, severely restless, overly excited and hyperactive, or not being able to sleep. If this happens, especially at the beginning of treatment or after a change in dose, call your health care professional. Dennis Bast may get drowsy or dizzy. Do not drive, use machinery, or do  anything that needs mental alertness until you know how this medicine affects you. Do not stand or sit up quickly, especially if you are an older patient. This reduces the risk of dizzy or fainting spells. Alcohol may interfere with the effect of this medicine. Avoid alcoholic drinks. This medicine may cause dry eyes and blurred vision. If you wear contact lenses you may feel some discomfort. Lubricating drops may help. See your eye doctor if the problem does not go away or is severe. Your mouth may get dry. Chewing sugarless gum, sucking hard candy and drinking plenty of water may help. Contact your doctor if the problem does not go away or is severe. What side effects may I notice from receiving this medicine? Side effects that you should report to your doctor or health care professional as soon as possible:  allergic reactions like skin rash, itching  or hives, swelling of the face, lips, or tongue  elevated mood, decreased need for sleep, racing thoughts, impulsive behavior  confusion  fast, irregular heartbeat  feeling faint or lightheaded, falls  feeling agitated, angry, or irritable  loss of balance or coordination  painful or prolonged erections  restlessness, pacing, inability to keep still  suicidal thoughts or other mood changes  tremors  trouble sleeping  seizures  unusual bleeding or bruising Side effects that usually do not require medical attention (report to your doctor or health care professional if they continue or are bothersome):  change in sex drive or performance  change in appetite or weight  constipation  headache  muscle aches or pains  nausea This list may not describe all possible side effects. Call your doctor for medical advice about side effects. You may report side effects to FDA at 1-800-FDA-1088. Where should I keep my medicine? Keep out of the reach of children. Store at room temperature between 15 and 30 degrees C (59 to 86 degrees  F). Protect from light. Keep container tightly closed. Throw away any unused medicine after the expiration date. NOTE: This sheet is a summary. It may not cover all possible information. If you have questions about this medicine, talk to your doctor, pharmacist, or health care provider.  2021 Elsevier/Gold Standard (2020-02-19 14:46:11)

## 2020-06-22 ENCOUNTER — Encounter: Payer: Self-pay | Admitting: Adult Health

## 2020-06-22 LAB — COMPREHENSIVE METABOLIC PANEL
ALT: 7 IU/L (ref 0–32)
AST: 18 IU/L (ref 0–40)
Albumin/Globulin Ratio: 1.6 (ref 1.2–2.2)
Albumin: 4.1 g/dL (ref 3.5–4.6)
Alkaline Phosphatase: 118 IU/L (ref 44–121)
BUN/Creatinine Ratio: 21 (ref 12–28)
BUN: 27 mg/dL (ref 10–36)
Bilirubin Total: 0.2 mg/dL (ref 0.0–1.2)
CO2: 22 mmol/L (ref 20–29)
Calcium: 9.6 mg/dL (ref 8.7–10.3)
Chloride: 104 mmol/L (ref 96–106)
Creatinine, Ser: 1.28 mg/dL — ABNORMAL HIGH (ref 0.57–1.00)
Globulin, Total: 2.5 g/dL (ref 1.5–4.5)
Glucose: 94 mg/dL (ref 65–99)
Potassium: 4.4 mmol/L (ref 3.5–5.2)
Sodium: 141 mmol/L (ref 134–144)
Total Protein: 6.6 g/dL (ref 6.0–8.5)
eGFR: 39 mL/min/{1.73_m2} — ABNORMAL LOW (ref >59)

## 2020-06-22 LAB — CBC WITH DIFFERENTIAL/PLATELET
Basophils Absolute: 0 10*3/uL (ref 0.0–0.2)
Basos: 0 %
EOS (ABSOLUTE): 0.1 10*3/uL (ref 0.0–0.4)
Eos: 1 %
Hematocrit: 31 % — ABNORMAL LOW (ref 34.0–46.6)
Hemoglobin: 11 g/dL — ABNORMAL LOW (ref 11.1–15.9)
Immature Grans (Abs): 0 10*3/uL (ref 0.0–0.1)
Immature Granulocytes: 1 %
Lymphocytes Absolute: 1.4 10*3/uL (ref 0.7–3.1)
Lymphs: 20 %
MCH: 33.1 pg — ABNORMAL HIGH (ref 26.6–33.0)
MCHC: 35.5 g/dL (ref 31.5–35.7)
MCV: 93 fL (ref 79–97)
Monocytes Absolute: 0.6 10*3/uL (ref 0.1–0.9)
Monocytes: 8 %
Neutrophils Absolute: 4.8 10*3/uL (ref 1.4–7.0)
Neutrophils: 70 %
Platelets: 196 10*3/uL (ref 150–450)
RBC: 3.32 x10E6/uL — ABNORMAL LOW (ref 3.77–5.28)
RDW: 11.8 % (ref 11.7–15.4)
WBC: 6.9 10*3/uL (ref 3.4–10.8)

## 2020-06-22 LAB — BRAIN NATRIURETIC PEPTIDE: BNP: 264.1 pg/mL — ABNORMAL HIGH (ref 0.0–100.0)

## 2020-06-22 LAB — TSH: TSH: 1.27 u[IU]/mL (ref 0.450–4.500)

## 2020-06-22 NOTE — Progress Notes (Signed)
See previous note. BNP is stable.

## 2020-06-22 NOTE — Progress Notes (Signed)
Hemoglobin is low, please add on iron, tibc, ferritin.  Verify if any bleeding.  CMP stable slightly decreased kidney function.  TSH for thyroid stable.  BNP still pending.

## 2020-06-27 LAB — IRON,TIBC AND FERRITIN PANEL
Ferritin: 408 ng/mL — ABNORMAL HIGH (ref 15–150)
Iron Saturation: 23 % (ref 15–55)
Iron: 51 ug/dL (ref 27–139)
Total Iron Binding Capacity: 223 ug/dL — ABNORMAL LOW (ref 250–450)
UIBC: 172 ug/dL (ref 118–369)

## 2020-06-27 LAB — SPECIMEN STATUS REPORT

## 2020-06-27 NOTE — Progress Notes (Signed)
Hemoglobin is low. Recheck CBC in 3 weeks please add lab order.  Verify if any bleeding?  CMP stable slightly decreased kidney function from last. TSH for thyroid stable.  BNP stable slightly decreased from 2 years ago.

## 2020-07-14 ENCOUNTER — Other Ambulatory Visit: Payer: Self-pay | Admitting: Adult Health

## 2020-07-14 ENCOUNTER — Other Ambulatory Visit: Payer: Self-pay | Admitting: Family Medicine

## 2020-07-14 DIAGNOSIS — I10 Essential (primary) hypertension: Secondary | ICD-10-CM

## 2020-07-14 NOTE — Telephone Encounter (Signed)
Requested Prescriptions  Pending Prescriptions Disp Refills  . metoprolol tartrate (LOPRESSOR) 25 MG tablet [Pharmacy Med Name: METOPROLOL TARTRATE 25 MG TAB] 12 tablet 0    Sig: TAKE 1/2 TABLET BY MOUTH TWICE A DAY     Cardiovascular:  Beta Blockers Failed - 07/14/2020 10:30 AM      Failed - Last BP in normal range    BP Readings from Last 1 Encounters:  06/21/20 (!) 181/55         Passed - Last Heart Rate in normal range    Pulse Readings from Last 1 Encounters:  06/21/20 (!) 59         Passed - Valid encounter within last 6 months    Recent Outpatient Visits          3 weeks ago Insomnia, unspecified type   [facility], FNP   10 months ago Primary insomnia   [facility], Vickki Muff, PA-C   10 months ago Essential (primary) hypertension   [facility], Vickki Muff, PA-C   2 years ago Essential (primary) hypertension   [facility], Vickki Muff, PA-C   2 years ago Hyponatremia   [facility], Vickki Muff, PA-C      Future Appointments            In 1 week Flinchum, Kelby Aline, [place], [place]

## 2020-07-14 NOTE — Telephone Encounter (Signed)
Requested Prescriptions  Pending Prescriptions Disp Refills  . enalapril (VASOTEC) 10 MG tablet [Pharmacy Med Name: ENALAPRIL MALEATE 10 MG TAB] 30 tablet 0    Sig: TAKE 1 TABLET BY MOUTH EVERY DAY     Cardiovascular:  ACE Inhibitors Failed - 07/14/2020 12:30 PM      Failed - Cr in normal range and within 180 days    Creat  Date Value Ref Range Status  02/05/2017 0.84 0.60 - 0.88 mg/dL Final    Comment:    For patients >85 years of age, the reference limit for Creatinine is approximately 13% higher for people identified as African-American. .    Creatinine, Ser  Date Value Ref Range Status  06/21/2020 1.28 (H) 0.57 - 1.00 mg/dL Final   Creatinine, Urine  Date Value Ref Range Status  01/14/2017 48 mg/dL Final  01/14/2017 47 mg/dL Final         Failed - Last BP in normal range    BP Readings from Last 1 Encounters:  06/21/20 (!) 181/55         Passed - K in normal range and within 180 days    Potassium  Date Value Ref Range Status  06/21/2020 4.4 3.5 - 5.2 mmol/L Final  09/13/2013 3.7 3.5 - 5.1 mmol/L Final         Passed - Patient is not pregnant      Passed - Valid encounter within last 6 months    Recent Outpatient Visits          3 weeks ago Insomnia, unspecified type   [facility] Flinchum, Kelby Aline, FNP   10 months ago Primary insomnia   [facility], Vickki Muff, PA-C   10 months ago Essential (primary) hypertension   [facility], Vickki Muff, PA-C   2 years ago Essential (primary) hypertension   [facility], Vickki Muff, PA-C   2 years ago Hyponatremia   [facility], Vickki Muff, PA-C      Future Appointments            In 1 week Flinchum, Kelby Aline, [place], [place]

## 2020-07-21 ENCOUNTER — Inpatient Hospital Stay
Admission: EM | Admit: 2020-07-21 | Discharge: 2020-07-24 | DRG: 536 | Disposition: A | Discharge status: Skilled Nursing Facility | Payer: PPO | Attending: Internal Medicine | Admitting: Internal Medicine

## 2020-07-21 ENCOUNTER — Emergency Department: Payer: PPO

## 2020-07-21 ENCOUNTER — Observation Stay: Payer: PPO

## 2020-07-21 ENCOUNTER — Other Ambulatory Visit: Payer: Self-pay

## 2020-07-21 DIAGNOSIS — F418 Other specified anxiety disorders: Secondary | ICD-10-CM | POA: Diagnosis not present

## 2020-07-21 DIAGNOSIS — Z880 Allergy status to penicillin: Secondary | ICD-10-CM | POA: Diagnosis not present

## 2020-07-21 DIAGNOSIS — W1830XA Fall on same level, unspecified, initial encounter: Secondary | ICD-10-CM | POA: Diagnosis present

## 2020-07-21 DIAGNOSIS — M8000XD Age-related osteoporosis with current pathological fracture, unspecified site, subsequent encounter for fracture with routine healing: Secondary | ICD-10-CM | POA: Diagnosis not present

## 2020-07-21 DIAGNOSIS — S329XXA Fracture of unspecified parts of lumbosacral spine and pelvis, initial encounter for closed fracture: Secondary | ICD-10-CM | POA: Diagnosis present

## 2020-07-21 DIAGNOSIS — Y92009 Unspecified place in unspecified non-institutional (private) residence as the place of occurrence of the external cause: Secondary | ICD-10-CM | POA: Insufficient documentation

## 2020-07-21 DIAGNOSIS — Z20822 Contact with and (suspected) exposure to covid-19: Secondary | ICD-10-CM | POA: Diagnosis present

## 2020-07-21 DIAGNOSIS — I10 Essential (primary) hypertension: Secondary | ICD-10-CM

## 2020-07-21 DIAGNOSIS — J8410 Pulmonary fibrosis, unspecified: Secondary | ICD-10-CM | POA: Diagnosis not present

## 2020-07-21 DIAGNOSIS — S32502A Unspecified fracture of left pubis, initial encounter for closed fracture: Secondary | ICD-10-CM | POA: Diagnosis not present

## 2020-07-21 DIAGNOSIS — I5032 Chronic diastolic (congestive) heart failure: Secondary | ICD-10-CM | POA: Diagnosis not present

## 2020-07-21 DIAGNOSIS — G319 Degenerative disease of nervous system, unspecified: Secondary | ICD-10-CM | POA: Diagnosis not present

## 2020-07-21 DIAGNOSIS — Z8249 Family history of ischemic heart disease and other diseases of the circulatory system: Secondary | ICD-10-CM

## 2020-07-21 DIAGNOSIS — N1831 Chronic kidney disease, stage 3a: Secondary | ICD-10-CM | POA: Diagnosis not present

## 2020-07-21 DIAGNOSIS — Z888 Allergy status to other drugs, medicaments and biological substances status: Secondary | ICD-10-CM

## 2020-07-21 DIAGNOSIS — W19XXXA Unspecified fall, initial encounter: Secondary | ICD-10-CM

## 2020-07-21 DIAGNOSIS — Z79899 Other long term (current) drug therapy: Secondary | ICD-10-CM

## 2020-07-21 DIAGNOSIS — M19049 Primary osteoarthritis, unspecified hand: Secondary | ICD-10-CM | POA: Diagnosis not present

## 2020-07-21 DIAGNOSIS — E559 Vitamin D deficiency, unspecified: Secondary | ICD-10-CM | POA: Diagnosis not present

## 2020-07-21 DIAGNOSIS — R748 Abnormal levels of other serum enzymes: Secondary | ICD-10-CM | POA: Diagnosis not present

## 2020-07-21 DIAGNOSIS — S32692A Other specified fracture of left ischium, initial encounter for closed fracture: Secondary | ICD-10-CM | POA: Diagnosis not present

## 2020-07-21 DIAGNOSIS — S32602D Unspecified fracture of left ischium, subsequent encounter for fracture with routine healing: Secondary | ICD-10-CM | POA: Diagnosis not present

## 2020-07-21 DIAGNOSIS — K5790 Diverticulosis of intestine, part unspecified, without perforation or abscess without bleeding: Secondary | ICD-10-CM | POA: Diagnosis not present

## 2020-07-21 DIAGNOSIS — N183 Chronic kidney disease, stage 3 unspecified: Secondary | ICD-10-CM | POA: Diagnosis not present

## 2020-07-21 DIAGNOSIS — Z66 Do not resuscitate: Secondary | ICD-10-CM | POA: Diagnosis not present

## 2020-07-21 DIAGNOSIS — S32512A Fracture of superior rim of left pubis, initial encounter for closed fracture: Secondary | ICD-10-CM | POA: Diagnosis present

## 2020-07-21 DIAGNOSIS — S32602A Unspecified fracture of left ischium, initial encounter for closed fracture: Secondary | ICD-10-CM | POA: Diagnosis not present

## 2020-07-21 DIAGNOSIS — W19XXXD Unspecified fall, subsequent encounter: Secondary | ICD-10-CM | POA: Diagnosis not present

## 2020-07-21 DIAGNOSIS — S0990XA Unspecified injury of head, initial encounter: Secondary | ICD-10-CM | POA: Diagnosis not present

## 2020-07-21 DIAGNOSIS — S32592A Other specified fracture of left pubis, initial encounter for closed fracture: Principal | ICD-10-CM | POA: Diagnosis present

## 2020-07-21 DIAGNOSIS — J309 Allergic rhinitis, unspecified: Secondary | ICD-10-CM | POA: Diagnosis not present

## 2020-07-21 DIAGNOSIS — G4700 Insomnia, unspecified: Secondary | ICD-10-CM | POA: Diagnosis not present

## 2020-07-21 DIAGNOSIS — I130 Hypertensive heart and chronic kidney disease with heart failure and stage 1 through stage 4 chronic kidney disease, or unspecified chronic kidney disease: Secondary | ICD-10-CM | POA: Diagnosis present

## 2020-07-21 DIAGNOSIS — M810 Age-related osteoporosis without current pathological fracture: Secondary | ICD-10-CM | POA: Diagnosis not present

## 2020-07-21 DIAGNOSIS — S32502D Unspecified fracture of left pubis, subsequent encounter for fracture with routine healing: Secondary | ICD-10-CM | POA: Diagnosis not present

## 2020-07-21 DIAGNOSIS — M62838 Other muscle spasm: Secondary | ICD-10-CM | POA: Diagnosis not present

## 2020-07-21 LAB — CBC WITH DIFFERENTIAL/PLATELET
Abs Immature Granulocytes: 0.04 10*3/uL (ref 0.00–0.07)
Basophils Absolute: 0 10*3/uL (ref 0.0–0.1)
Basophils Relative: 0 %
Eosinophils Absolute: 0 10*3/uL (ref 0.0–0.5)
Eosinophils Relative: 0 %
HCT: 30.9 % — ABNORMAL LOW (ref 36.0–46.0)
Hemoglobin: 10.2 g/dL — ABNORMAL LOW (ref 12.0–15.0)
Immature Granulocytes: 0 %
Lymphocytes Relative: 13 %
Lymphs Abs: 1.2 10*3/uL (ref 0.7–4.0)
MCH: 31.3 pg (ref 26.0–34.0)
MCHC: 33 g/dL (ref 30.0–36.0)
MCV: 94.8 fL (ref 80.0–100.0)
Monocytes Absolute: 0.7 10*3/uL (ref 0.1–1.0)
Monocytes Relative: 8 %
Neutro Abs: 7.3 10*3/uL (ref 1.7–7.7)
Neutrophils Relative %: 79 %
Platelets: 134 10*3/uL — ABNORMAL LOW (ref 150–400)
RBC: 3.26 MIL/uL — ABNORMAL LOW (ref 3.87–5.11)
RDW: 13.4 % (ref 11.5–15.5)
WBC: 9.3 10*3/uL (ref 4.0–10.5)
nRBC: 0 % (ref 0.0–0.2)

## 2020-07-21 LAB — TYPE AND SCREEN
ABO/RH(D): O POS
Antibody Screen: NEGATIVE

## 2020-07-21 LAB — COMPREHENSIVE METABOLIC PANEL
ALT: 15 U/L (ref 0–44)
AST: 33 U/L (ref 15–41)
Albumin: 4.2 g/dL (ref 3.5–5.0)
Alkaline Phosphatase: 71 U/L (ref 38–126)
Anion gap: 6 (ref 5–15)
BUN: 37 mg/dL — ABNORMAL HIGH (ref 8–23)
CO2: 25 mmol/L (ref 22–32)
Calcium: 9.6 mg/dL (ref 8.9–10.3)
Chloride: 106 mmol/L (ref 98–111)
Creatinine, Ser: 1.47 mg/dL — ABNORMAL HIGH (ref 0.44–1.00)
GFR, Estimated: 33 mL/min — ABNORMAL LOW (ref >60)
Glucose, Bld: 99 mg/dL (ref 70–99)
Potassium: 4 mmol/L (ref 3.5–5.1)
Sodium: 137 mmol/L (ref 135–145)
Total Bilirubin: 1 mg/dL (ref 0.3–1.2)
Total Protein: 7.3 g/dL (ref 6.5–8.1)

## 2020-07-21 LAB — RESP PANEL BY RT-PCR (FLU A&B, COVID) ARPGX2
Influenza A by PCR: NEGATIVE
Influenza B by PCR: NEGATIVE
SARS Coronavirus 2 by RT PCR: NEGATIVE

## 2020-07-21 LAB — URINALYSIS, COMPLETE (UACMP) WITH MICROSCOPIC
Bilirubin Urine: NEGATIVE
Glucose, UA: NEGATIVE mg/dL
Ketones, ur: NEGATIVE mg/dL
Nitrite: NEGATIVE
Protein, ur: NEGATIVE mg/dL
Specific Gravity, Urine: 1.01 (ref 1.005–1.030)
pH: 6 (ref 5.0–8.0)

## 2020-07-21 LAB — PROTIME-INR
INR: 1.1 (ref 0.8–1.2)
Prothrombin Time: 13.3 seconds (ref 11.4–15.2)

## 2020-07-21 LAB — BRAIN NATRIURETIC PEPTIDE: B Natriuretic Peptide: 238.8 pg/mL — ABNORMAL HIGH (ref 0.0–100.0)

## 2020-07-21 LAB — CK: Total CK: 430 U/L — ABNORMAL HIGH (ref 38–234)

## 2020-07-21 MED ORDER — ENALAPRIL MALEATE 10 MG PO TABS
10.0000 mg | ORAL_TABLET | Freq: Every day | ORAL | Status: DC
  Administered 2020-07-22 – 2020-07-24 (×3): 10 mg via ORAL
  Filled 2020-07-21 (×3): qty 1

## 2020-07-21 MED ORDER — SODIUM CHLORIDE 0.9 % IV BOLUS
500.0000 mL | Freq: Once | INTRAVENOUS | Status: AC
  Administered 2020-07-21: 500 mL via INTRAVENOUS

## 2020-07-21 MED ORDER — ACETAMINOPHEN 325 MG PO TABS
650.0000 mg | ORAL_TABLET | Freq: Four times a day (QID) | ORAL | Status: DC | PRN
  Administered 2020-07-21 – 2020-07-23 (×3): 650 mg via ORAL
  Filled 2020-07-21 (×3): qty 2

## 2020-07-21 MED ORDER — METHOCARBAMOL 500 MG PO TABS
500.0000 mg | ORAL_TABLET | Freq: Three times a day (TID) | ORAL | Status: DC | PRN
  Filled 2020-07-21: qty 1

## 2020-07-21 MED ORDER — MORPHINE SULFATE (PF) 2 MG/ML IV SOLN: 0.5000 mg | INTRAVENOUS | Status: DC | PRN

## 2020-07-21 MED ORDER — SENNOSIDES-DOCUSATE SODIUM 8.6-50 MG PO TABS: 1.0000 | ORAL_TABLET | Freq: Every evening | ORAL | Status: DC | PRN

## 2020-07-21 MED ORDER — OXYCODONE-ACETAMINOPHEN 5-325 MG PO TABS
1.0000 | ORAL_TABLET | ORAL | Status: DC | PRN
Start: 2020-07-21 — End: 2020-07-24

## 2020-07-21 MED ORDER — METOPROLOL TARTRATE 25 MG PO TABS
12.5000 mg | ORAL_TABLET | Freq: Two times a day (BID) | ORAL | Status: DC
  Administered 2020-07-21 – 2020-07-24 (×6): 12.5 mg via ORAL
  Filled 2020-07-21 (×6): qty 1

## 2020-07-21 MED ORDER — TRAZODONE HCL 50 MG PO TABS
25.0000 mg | ORAL_TABLET | Freq: Every evening | ORAL | Status: DC | PRN
  Administered 2020-07-22: 25 mg via ORAL
  Filled 2020-07-21: qty 1

## 2020-07-21 MED ORDER — ENOXAPARIN SODIUM 40 MG/0.4ML ~~LOC~~ SOLN
40.0000 mg | SUBCUTANEOUS | Status: DC
  Administered 2020-07-21: 40 mg via SUBCUTANEOUS
  Filled 2020-07-21: qty 0.4

## 2020-07-21 MED ORDER — ONDANSETRON HCL 4 MG/2ML IJ SOLN: 4.0000 mg | Freq: Three times a day (TID) | INTRAMUSCULAR | Status: DC | PRN

## 2020-07-21 MED ORDER — HYDRALAZINE HCL 20 MG/ML IJ SOLN: 5.0000 mg | INTRAMUSCULAR | Status: DC | PRN

## 2020-07-21 NOTE — ED Provider Notes (Signed)
[facility] Emergency Department Provider Note  ____________________________________________  Time seen: Approximately 2:01 PM  I have reviewed the triage vital signs and the nursing notes.   HISTORY  Chief Complaint Fall and Groin Pain    HPI [name] is a 87 y.o. female who presents the emergency department for complaint of left groin/hip pain.  Patient sustained a mechanical fall at home 2 to 3 days ago.  She states that she fell before the weekend, was walking in her back door when her walker caught the edge of the stove causing her to "balance" sideways.  The other wheel of the walker caught some drink hands on the floor causing her to fall.  She landed on the left side.  Did not hit her head or lose consciousness.  She states that after this injury she was unable to bear weight on the left lower extremity.  She states that she had to slide her self through the flooring dragging her walker behind her and so she get to the couch and pull herself up on the couch.  She states that she was unable to get to the phone due to inability to walk and she is been immobile for several days.  Patient is currently complaining of left groin pain.  She is still unable to walk or bear weight.  No other complaints at this time.         Past Medical History:  Diagnosis Date  . Breast cyst    removed  . Diverticulitis   . Gallstones   . Hypertension   . Low sodium levels     Patient Active Problem List   Diagnosis Date Noted  . Closed fracture of left superior pubic ramus ([place]) 07/21/2020  . Closed pelvic fracture ([place]) 07/21/2020  . Chronic diastolic CHF (congestive heart failure) ([place]) 07/21/2020  . Fall at home, initial encounter 07/21/2020  . CKD (chronic kidney disease), stage IIIa 07/21/2020  . Elevated CK 07/21/2020  . Syncope 08/17/2017  . Hyponatremia 01/11/2017  . Arthritis of both hands 01/14/2016  . Acute UTI 08/21/2015  . Scalp laceration  07/29/2015  . Fall 07/29/2015  . Near syncope 07/21/2015  . Foot pain 12/31/2014  . Anxiety 10/17/2014  . Allergic rhinitis 10/16/2014  . Airway hyperreactivity 10/16/2014  . Biliary calculi, common bile duct 10/16/2014  . Clinical depression 10/16/2014  . 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 Clicking shoulder [id number]  . Essential (primary) hypertension 10/16/2014  . H/O gastrointestinal hemorrhage 10/16/2014  . 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 Below normal amount of sodium in the blood 10/16/2014  . Insomnia 10/16/2014  . Bad memory 10/16/2014  . OP (osteoporosis) 10/16/2014  . Cyst of pancreas 10/16/2014  . Temporary cerebral vascular dysfunction 10/16/2014  . Other constipation 03/07/2014  . Right groin pain 03/07/2014  . Internal hemorrhoid 03/07/2014  . Gallstones, common bile duct 08/29/2013  . Cholelithiasis 08/29/2013  . CYST AND PSEUDOCYST OF PANCREAS 03/12/2008    Past Surgical History:  Procedure Laterality Date  . ABDOMINAL HYSTERECTOMY  2000  . APPENDECTOMY  1940's  . bladder tack  1990's  . BREAST CYST INCISION AND DRAINAGE Left 1990's  . CHOLECYSTECTOMY  09/11/13   Cholangiogram suggested a retained stone ductal dilatation.  Marland Kitchen ERCP W/ SPHICTEROTOMY  09/12/2013   Recurrent stone identified post cholecystectomy.  Marland Kitchen NASAL SINUS SURGERY      Prior to Admission medications   Medication Sig Start Date End Date Taking? Authorizing Provider  acetaminophen (TYLENOL)  325 MG tablet Take 2 tablets (650 mg total) by mouth every 6 (six) hours as needed for mild pain (or Fever >/= 101). 07/23/15   Gouru, Illene Silver, MD  enalapril (VASOTEC) 10 MG tablet TAKE 1 TABLET BY MOUTH EVERY DAY 07/14/20   Chrismon, Vickki Muff, PA-C  feeding supplement (BOOST / RESOURCE BREEZE) LIQD Take 1 Container by mouth 3 (three) times daily between meals. 01/14/17   Epifanio Lesches, MD  metoprolol tartrate (LOPRESSOR) 25 MG tablet TAKE 1/2 TABLET BY MOUTH TWICE A DAY 07/14/20   Chrismon,  Vickki Muff, PA-C  torsemide (DEMADEX) 20 MG tablet Take 20 mg by mouth every morning. 09/30/17   [provider]  traZODone (DESYREL) 50 MG tablet Take 0.5 tablets (25 mg total) by mouth at bedtime as needed for sleep. 06/21/20   Flinchum, Kelby Aline, FNP    Allergies Accolate [zafirlukast], Bactrim [sulfamethoxazole-trimethoprim], Metronidazole, Nitrofurantoin monohyd macro, Penicillins, and Keflex [cephalexin]  Family History  Problem Relation Age of Onset  . Hypertension Father   . CAD Father   . Cancer Brother        unknown type    Social History Social History   Tobacco Use  . Smoking status: Never Smoker  . Smokeless tobacco: Never Used  Vaping Use  . Vaping Use: Never used  Substance Use Topics  . Alcohol use: No  . Drug use: No     Review of Systems  Constitutional: No fever/chills Eyes: No visual changes. No discharge ENT: No upper respiratory complaints. Cardiovascular: no chest pain. Respiratory: no cough. No SOB. Gastrointestinal: No abdominal pain.  No nausea, no vomiting.  No diarrhea.  No constipation. Genitourinary: Negative for dysuria. No hematuria Musculoskeletal: Left hip pain following a fall Skin: Negative for rash, abrasions, lacerations, ecchymosis. Neurological: Negative for headaches, focal weakness or numbness.  10 System ROS otherwise negative.  ____________________________________________   PHYSICAL EXAM:  VITAL SIGNS: ED Triage Vitals  Enc Vitals Group     BP 07/21/20 1342 (!) 158/63     Pulse Rate 07/21/20 1342 65     Resp 07/21/20 1342 18     Temp 07/21/20 1342 97.6 F (36.4 C)     Temp Source 07/21/20 1342 Oral     SpO2 07/21/20 1342 93 %     Weight 07/21/20 1339 121 lb 4.1 oz (55 kg)     Height 07/21/20 1339 '5\' 4"'$  (1.626 m)     Head Circumference --      Peak Flow --      Pain Score 07/21/20 1339 8     Pain Loc --      Pain Edu? --      Excl. in [place]? --      Constitutional: Alert and oriented. Well appearing  and in no acute distress. Eyes: Conjunctivae are normal. PERRL. EOMI. Head: Atraumatic. ENT:      Ears:       Nose: No congestion/rhinnorhea.      Mouth/Throat: Mucous membranes are moist.  Neck: No stridor.  No cervical spine tenderness to palpation.  Cardiovascular: Normal rate, regular rhythm. Normal S1 and S2.  Good peripheral circulation. Respiratory: Normal respiratory effort without tachypnea or retractions. Lungs CTAB. Good air entry to the bases with no decreased or absent breath sounds. Gastrointestinal: Bowel sounds 4 quadrants. Soft and nontender to palpation. No guarding or rigidity. No palpable masses. No distention. No CVA tenderness. Musculoskeletal: Full range of motion to all extremities. No gross deformities appreciated.  Visualization of the left  lower extremity reveals no shortening or rotation.  No obvious deformity to the hip.  Tenderness through the anterior hip and groin region.  No palpable abnormalities.  No tenderness over the lumbar spine.  Dorsalis pedis pulses sensation intact distally. Neurologic:  Normal speech and language. No gross focal neurologic deficits are appreciated.  Skin:  Skin is warm, dry and intact. No rash noted. Psychiatric: Mood and affect are normal. Speech and behavior are normal. Patient exhibits appropriate insight and judgement.   ____________________________________________   LABS (all labs ordered are listed, but only abnormal results are displayed)  Labs Reviewed  COMPREHENSIVE METABOLIC PANEL - Abnormal; Notable for the following components:      Result Value   BUN 37 (*)    Creatinine, Ser 1.47 (*)    GFR, Estimated 33 (*)    All other components within normal limits  CBC WITH DIFFERENTIAL/PLATELET - Abnormal; Notable for the following components:   RBC 3.26 (*)    Hemoglobin 10.2 (*)    HCT 30.9 (*)    Platelets 134 (*)    All other components within normal limits  CK - Abnormal; Notable for the following components:    Total CK 430 (*)    All other components within normal limits  RESP PANEL BY RT-PCR (FLU A&B, COVID) ARPGX2  URINALYSIS, COMPLETE (UACMP) WITH MICROSCOPIC  BRAIN NATRIURETIC PEPTIDE  TYPE AND SCREEN   ____________________________________________  EKG   ____________________________________________  RADIOLOGY I personally viewed and evaluated these images as part of my medical decision making, as well as reviewing the written report by the radiologist.  ED Provider Interpretation: Pubic rami/ischium fracture identified on the left side.  No evidence of hip fracture  DG Chest 1 View  Result Date: 07/21/2020 CLINICAL DATA:  Pain fall going fall EXAM: CHEST  1 VIEW COMPARISON:  August 09, 2013 FINDINGS: Calcified granuloma noted in left lower lung region. No edema or airspace opacity. Heart size and pulmonary vascularity are normal. No adenopathy. Bones are diffusely osteoporotic. No pneumothorax. IMPRESSION: Granuloma left lower lobe. No edema or airspace opacity. Cardiac silhouette normal. Bones osteoporotic. Electronically Signed   By: Lowella Grip III M.D.   On: 07/21/2020 15:08   DG Hip Unilat With Pelvis 2-3 Views Left  Result Date: 07/21/2020 CLINICAL DATA:  Pain following fall EXAM: DG HIP (WITH OR WITHOUT PELVIS) 2-3V LEFT COMPARISON:  None. FINDINGS: Frontal pelvis as well as frontal and lateral left hip images were obtained. There is diffuse osteoporosis. There is a fracture of the midportion of the left superior pubic ramus as well as a fracture of the mid left ischium with mild displacement of fracture fragments in these areas. No other appreciable fractures. No dislocations. Moderate symmetric narrowing of each hip joint noted. Postoperative change in each pubic symphysis. Degenerative change noted in the visualized lumbar spine. Foci of arterial vascular calcification noted in each thigh region. IMPRESSION: Mildly displaced fractures of the left superior pubic ramus and  ischium. Proximal femurs appear intact. No dislocation. Moderate symmetric narrowing of each hip joint noted. Bones osteoporotic. Electronically Signed   By: Lowella Grip III M.D.   On: 07/21/2020 15:07    ____________________________________________    PROCEDURES  Procedure(s) performed:    Procedures    Medications  oxyCODONE-acetaminophen (PERCOCET/ROXICET) 5-325 MG per tablet 1 tablet (has no administration in time range)  morphine 2 MG/ML injection 0.5 mg (has no administration in time range)  methocarbamol (ROBAXIN) tablet 500 mg (has no administration in time range)  acetaminophen (TYLENOL) tablet 650 mg (has no administration in time range)  ondansetron (ZOFRAN) injection 4 mg (has no administration in time range)  hydrALAZINE (APRESOLINE) injection 5 mg (has no administration in time range)     ____________________________________________   INITIAL IMPRESSION / ASSESSMENT AND PLAN / ED COURSE  Pertinent labs & imaging results that were available during my care of the patient were reviewed by me and considered in my medical decision making (see chart for details).  Review of the [place] CSRS was performed in accordance of the [place] prior to dispensing any controlled drugs.           Patient's diagnosis is consistent with fall, ischial fracture.  Patient presented to emergency department after a mechanical fall.  She states that this happened a couple of days ago, she was able to drag herself to a couch but could not get to the phone to call somebody.  Patient has been immobile for several days.  Sharp pain in the groin on exam and reported pain at rest.  No shortening or rotation of left lower extremity.  She was tender in the groin without other palpable tenderness. She not hit her head or lose consciousness at the time.  Concern was for pelvic versus hip fracture.  Imaging revealed pubic rami with ischium fracture to the left side.  Patient is unable to bear weight  currently and will be admitted to the hospitalist service.     ____________________________________________  FINAL CLINICAL IMPRESSION(S) / ED DIAGNOSES  Final diagnoses:  Fall, initial encounter  Other closed fracture of left ischium, initial encounter ([place])      NEW MEDICATIONS STARTED DURING THIS VISIT:  ED Discharge Orders    None          This chart was dictated using voice recognition software/Dragon. Despite best efforts to proofread, errors can occur which can change the meaning. Any change was purely unintentional.    Darletta Moll, PA-C 07/21/20 1546    Naaman Plummer, MD 07/22/20 [phone]

## 2020-07-21 NOTE — ED Triage Notes (Signed)
Pt reports was walking with her walker and it got caught on something and she fell and is now having pain to her groin and difficulty standing. No LOC, no head trauma

## 2020-07-21 NOTE — H&P (Addendum)
History and Physical    [name] [id number] DOB: Aug 28, 1924 DOA: 07/21/2020  Referring MD/NP/PA:   PCP: Margo Common, PA-C   Patient coming from:  The patient is coming from home.  At baseline, pt is partially dependent for most of ADL.        Chief Complaint: fall and left groin pain  HPI: [name] is a 87 y.o. female with medical history significant of HTN, dCHF, CKD-IIIa, depression with anxiety, diverticulitis, memory loss, who presents with fall and left groin pain.  Per her granddaughter (I called her granddaughter by phone), patient fell when she was walking in the kitchen and her walker got caught on the edge of refrigerator yesterday. She landed on the left side, developed pain in the left groin area. The pain is constant, sharp, severe, nonradiating.  She is unable to bear weight on the left leg.  She states that she possibly hit her head, but no loss of consciousness.  No numbness in legs.  No facial droop or slurred speech.  Denies headache or neck pain.  Patient does not have chest pain, cough, shortness breath, no fever or chills.  No nausea, vomiting, diarrhea or abdominal pain.  No symptoms of UTI  ED Course: pt was found to have WBC 9.3, pending COVID-19 PCR, mildly elevated CK level 430, slightly worsening renal function, temperature normal, blood pressure 158/63, heart rate 65, RR 18, oxygen saturation 93-98% on room air.   X-ray of left hip/pelvis showed displaced superior pubic ramus and ischium fracture.  Patient is placed on MedSurg bed for placement.  Message sent to Dr. Harlow Mares of Ortho for consultation.  Review of Systems:   General: no fevers, chills, no body weight gain, has fatigue HEENT: no blurry vision, [name] changes or sore throat Respiratory: no dyspnea, coughing, wheezing CV: no chest pain, no palpitations GI: no nausea, vomiting, abdominal pain, diarrhea, constipation GU: no dysuria, burning on urination, increased urinary  frequency, hematuria  Ext: no leg edema Neuro: no unilateral weakness, numbness, or tingling, no vision change or [name] loss.  Has fall Skin: no rash. Has bruise over left elbow MSK: Has pain in left groin area Heme: No easy bruising.  Travel history: No recent long distant travel.  Allergy:  Allergies  Allergen Reactions  . Accolate [Zafirlukast] Other (See Comments)    Reaction: unknown  . Bactrim [Sulfamethoxazole-Trimethoprim] Nausea And Vomiting  . Metronidazole Other (See Comments)    Reaction: unknown  . Nitrofurantoin Monohyd Macro Other (See Comments)    GI upset  . Penicillins Diarrhea and Other (See Comments)    Has patient had a PCN reaction causing immediate rash, facial/tongue/throat swelling, SOB or lightheadedness with hypotension: Unknown Has patient had a PCN reaction causing severe rash involving mucus membranes or skin necrosis: Unknown Has patient had a PCN reaction that required hospitalization: Unknown Has patient had a PCN reaction occurring within the last 10 years: Unknown If all of the above answers are "NO", then may proceed with Cephalosporin use.  Marland Kitchen Keflex [Cephalexin] Rash    Past Medical History:  Diagnosis Date  . Breast cyst    removed  . Diverticulitis   . Gallstones   . Hypertension   . Low sodium levels     Past Surgical History:  Procedure Laterality Date  . ABDOMINAL HYSTERECTOMY  2000  . APPENDECTOMY  1940's  . bladder tack  1990's  . BREAST CYST INCISION AND DRAINAGE Left 1990's  . CHOLECYSTECTOMY  09/11/13  Cholangiogram suggested a retained stone ductal dilatation.  Marland Kitchen ERCP W/ SPHICTEROTOMY  09/12/2013   Recurrent stone identified post cholecystectomy.  Marland Kitchen NASAL SINUS SURGERY      Social History:  reports that she has never smoked. She has never used smokeless tobacco. She reports that she does not drink alcohol and does not use drugs.  Family History:  Family History  Problem Relation Age of Onset  . Hypertension Father    . CAD Father   . Cancer Brother        unknown type     Prior to Admission medications   Medication Sig Start Date End Date Taking? Authorizing Provider  acetaminophen (TYLENOL) 325 MG tablet Take 2 tablets (650 mg total) by mouth every 6 (six) hours as needed for mild pain (or Fever >/= 101). 07/23/15   Gouru, Illene Silver, MD  enalapril (VASOTEC) 10 MG tablet TAKE 1 TABLET BY MOUTH EVERY DAY 07/14/20   Chrismon, Vickki Muff, PA-C  feeding supplement (BOOST / RESOURCE BREEZE) LIQD Take 1 Container by mouth 3 (three) times daily between meals. 01/14/17   Epifanio Lesches, MD  metoprolol tartrate (LOPRESSOR) 25 MG tablet TAKE 1/2 TABLET BY MOUTH TWICE A DAY 07/14/20   Chrismon, Vickki Muff, PA-C  torsemide (DEMADEX) 20 MG tablet Take 20 mg by mouth every morning. 09/30/17   [provider]  traZODone (DESYREL) 50 MG tablet Take 0.5 tablets (25 mg total) by mouth at bedtime as needed for sleep. 06/21/20   Doreen Beam, FNP    Physical Exam: Vitals:   07/21/20 1519 07/21/20 1530 07/21/20 1640 07/21/20 1642  BP:  (!) 155/75  (!) 178/88  Pulse:  69 71 68  Resp:  '14 16 15  '$ Temp:   (!) 97.5 F (36.4 C) (!) 97.5 F (36.4 C)  TempSrc:      SpO2: 100% 100% 95% 98%  Weight:      Height:       General: Not in acute distress HEENT:       Eyes: PERRL, EOMI, no scleral icterus.       ENT: No discharge from the ears and nose, no pharynx injection, no tonsillar enlargement.        Neck: No JVD, no bruit, no mass felt. Heme: No neck lymph node enlargement. Cardiac: S1/S2, RRR, No murmurs, No gallops or rubs. Respiratory: No rales, wheezing, rhonchi or rubs. GI: Soft, nondistended, nontender, no rebound pain, no organomegaly, BS present. GU: No hematuria Ext: No pitting leg edema bilaterally. 1+DP/PT pulse bilaterally. Skin: no rash. Has bruise over left elbow MSK: Has tendeness in left groin area Neuro: Alert, oriented X3, cranial nerves II-XII grossly intact, moves all extremities   Psych: Patient is not psychotic, no suicidal or hemocidal ideation.  Labs on Admission: I have personally reviewed following labs and imaging studies  CBC: Recent Labs  Lab 07/21/20 1447  WBC 9.3  NEUTROABS 7.3  HGB 10.2*  HCT 30.9*  MCV 94.8  PLT [id number]*   Basic Metabolic Panel: Recent Labs  Lab 07/21/20 1447  NA 137  K 4.0  CL 106  CO2 25  GLUCOSE 99  BUN 37*  CREATININE 1.47*  CALCIUM 9.6   GFR: Estimated Creatinine Clearance: 20.6 mL/min (A) (by C-G formula based on SCr of 1.47 mg/dL (H)). Liver Function Tests: Recent Labs  Lab 07/21/20 1447  AST 33  ALT 15  ALKPHOS 71  BILITOT 1.0  PROT 7.3  ALBUMIN 4.2   No results for input(s): LIPASE,  AMYLASE in the last 168 hours. No results for input(s): AMMONIA in the last 168 hours. Coagulation Profile: Recent Labs  Lab 07/21/20 1447  INR 1.1   Cardiac Enzymes: Recent Labs  Lab 07/21/20 1447  CKTOTAL 430*   BNP (last 3 results) No results for input(s): PROBNP in the last 8760 hours. HbA1C: No results for input(s): HGBA1C in the last 72 hours. CBG: No results for input(s): GLUCAP in the last 168 hours. Lipid Profile: No results for input(s): CHOL, HDL, LDLCALC, TRIG, CHOLHDL, LDLDIRECT in the last 72 hours. Thyroid Function Tests: No results for input(s): TSH, T4TOTAL, FREET4, T3FREE, THYROIDAB in the last 72 hours. Anemia Panel: No results for input(s): VITAMINB12, FOLATE, FERRITIN, TIBC, IRON, RETICCTPCT in the last 72 hours. Urine analysis:    Component Value Date/Time   COLORURINE YELLOW (A) 08/16/2017 2310   APPEARANCEUR Clear 08/27/2017 1132   LABSPEC 1.011 08/16/2017 2310   LABSPEC 1.014 08/09/2013 2100   PHURINE 6.0 08/16/2017 2310   GLUCOSEU Negative 08/27/2017 1132   GLUCOSEU Negative 08/09/2013 2100   HGBUR NEGATIVE 08/16/2017 2310   BILIRUBINUR Negative 08/27/2017 1132   BILIRUBINUR Negative 08/09/2013 2100   KETONESUR NEGATIVE 08/16/2017 2310   PROTEINUR Negative 08/27/2017 1132    PROTEINUR NEGATIVE 08/16/2017 2310   UROBILINOGEN 0.2 08/16/2017 1439   NITRITE Negative 08/27/2017 1132   NITRITE NEGATIVE 08/16/2017 2310   LEUKOCYTESUR Trace (A) 08/27/2017 1132   LEUKOCYTESUR Negative 08/09/2013 2100   Sepsis Labs: '@LABRCNTIP'$ (procalcitonin:4,lacticidven:4) ) Recent Results (from the past 240 hour(s))  Resp Panel by RT-PCR (Flu A&B, Covid) Nasopharyngeal Swab     Status: None   Collection Time: 07/21/20  3:18 PM   Specimen: Nasopharyngeal Swab; Nasopharyngeal(NP) swabs in vial transport medium  Result Value Ref Range Status   SARS Coronavirus 2 by RT PCR NEGATIVE NEGATIVE Final    Comment: (NOTE) SARS-CoV-2 target nucleic acids are NOT DETECTED.  The SARS-CoV-2 RNA is generally detectable in upper respiratory specimens during the acute phase of infection. The lowest concentration of SARS-CoV-2 viral copies this assay can detect is 138 copies/mL. A negative result does not preclude SARS-Cov-2 infection and should not be used as the sole basis for treatment or other patient management decisions. A negative result may occur with  improper specimen collection/handling, submission of specimen other than nasopharyngeal swab, presence of viral mutation(s) within the areas targeted by this assay, and inadequate number of viral copies(<138 copies/mL). A negative result must be combined with clinical observations, patient history, and epidemiological information. The expected result is Negative.  Fact Sheet for Patients:  EntrepreneurPulse.com.au  Fact Sheet for Healthcare Providers:  IncredibleEmployment.be  This test is no t yet approved or cleared by the [place] FDA and  has been authorized for detection and/or diagnosis of SARS-CoV-2 by FDA under an Emergency Use Authorization (EUA). This EUA will remain  in effect (meaning this test can be used) for the duration of the COVID-19 declaration under Section 564(b)(1) of  the Act, 21 U.S.C.section 360bbb-3(b)(1), unless the authorization is terminated  or revoked sooner.       Influenza A by PCR NEGATIVE NEGATIVE Final   Influenza B by PCR NEGATIVE NEGATIVE Final    Comment: (NOTE) The Xpert Xpress SARS-CoV-2/FLU/RSV plus assay is intended as an aid in the diagnosis of influenza from Nasopharyngeal swab specimens and should not be used as a sole basis for treatment. Nasal washings and aspirates are unacceptable for Xpert Xpress SARS-CoV-2/FLU/RSV testing.  Fact Sheet for Patients: EntrepreneurPulse.com.au  Fact Sheet  for Healthcare Providers: IncredibleEmployment.be  This test is not yet approved or cleared by the [place] and has been authorized for detection and/or diagnosis of SARS-CoV-2 by FDA under an Emergency Use Authorization (EUA). This EUA will remain in effect (meaning this test can be used) for the duration of the COVID-19 declaration under Section 564(b)(1) of the Act, 21 U.S.C. section 360bbb-3(b)(1), unless the authorization is terminated or revoked.  Performed at [facility], [address] [place], [place] [address]      Radiological Exams on Admission: DG Chest 1 View  Result Date: 07/21/2020 CLINICAL DATA:  Pain fall going fall EXAM: CHEST  1 VIEW COMPARISON:  August 09, 2013 FINDINGS: Calcified granuloma noted in left lower lung region. No edema or airspace opacity. Heart size and pulmonary vascularity are normal. No adenopathy. Bones are diffusely osteoporotic. No pneumothorax. IMPRESSION: Granuloma left lower lobe. No edema or airspace opacity. Cardiac silhouette normal. Bones osteoporotic. Electronically Signed   By: Lowella Grip III M.D.   On: 07/21/2020 15:08   DG Hip Unilat With Pelvis 2-3 Views Left  Result Date: 07/21/2020 CLINICAL DATA:  Pain following fall EXAM: DG HIP (WITH OR WITHOUT PELVIS) 2-3V LEFT COMPARISON:  None. FINDINGS: Frontal pelvis as well  as frontal and lateral left hip images were obtained. There is diffuse osteoporosis. There is a fracture of the midportion of the left superior pubic ramus as well as a fracture of the mid left ischium with mild displacement of fracture fragments in these areas. No other appreciable fractures. No dislocations. Moderate symmetric narrowing of each hip joint noted. Postoperative change in each pubic symphysis. Degenerative change noted in the visualized lumbar spine. Foci of arterial vascular calcification noted in each thigh region. IMPRESSION: Mildly displaced fractures of the left superior pubic ramus and ischium. Proximal femurs appear intact. No dislocation. Moderate symmetric narrowing of each hip joint noted. Bones osteoporotic. Electronically Signed   By: Lowella Grip III M.D.   On: 07/21/2020 15:07     EKG: Not done in ED, will get one.   Assessment/Plan Principal Problem:   Closed pelvic fracture (HCC) Active Problems:   Essential (primary) hypertension   Fall   Closed fracture of left superior pubic ramus (HCC)   Chronic diastolic CHF (congestive heart failure) (HCC)   CKD (chronic kidney disease), stage IIIa   Elevated CK   Closed pelvic fracture: X-ray showed mildly displaced fractures of the left superior pubic ramus and ischium. Proximal femurs appear intact.  Patient has severe pain currently. No neurovascular compromise. Orthopedic surgeon, Dr. Harlow Mares was consulted.   - will place in Med-surg bed for obs - Pain control: morphine prn and percocet - When necessary Zofran for nausea - Robaxin for muscle spasm - PT/OT   Fall:  -f/u CT-head -PT/OT  Essential (primary) hypertension -IV hydralazine as needed -Continue home enalapril, metoprolol -hold torsemide due to worsening renal function  Chronic diastolic CHF (congestive heart failure) ([place]): 2D echo on 08/17/2017 showed EF of 65-70% with grade 1 diastolic dysfunction.  Patient does not have leg edema.  No JVD.  No  shortness of breath.  CHF seem to be compensated. -hold torsemide due to worsening renal function - check BNP -->238  CKD (chronic kidney disease), stage IIIa: Recent baseline creatinine 1.28 on 06/21/2020.  Her creatinine is 1.47, BUN 37, slightly worsening -hold torsemide -IV fluid 500 cc normal saline  Elevated CK: CK level 430 -Normal saline 500 cc -repeat CK in AM  DVT ppx: SQ Lovenox Code Status: DNR per her granddaughter Family Communication:   Yes, patient's granddaughter by phone Disposition Plan:  Anticipate discharge back to previous environment Consults called:  Message sent to Dr. Harlow Mares of Ortho for consultation Admission status and Level of care: Med-Surg:   for obs  Status is: Observation  The patient remains OBS appropriate and will d/c before 2 midnights.  Dispo: The patient is from: Home              Anticipated d/c is to: To be determined              Patient currently is not medically stable to d/c.   Difficult to place patient No          Date of Service 07/21/2020    [facility]   If 7PM-7AM, please contact night-coverage [email or web address] 07/21/2020, 5:29 PM

## 2020-07-22 ENCOUNTER — Encounter: Payer: Self-pay | Admitting: Internal Medicine

## 2020-07-22 DIAGNOSIS — S32502A Unspecified fracture of left pubis, initial encounter for closed fracture: Secondary | ICD-10-CM | POA: Diagnosis not present

## 2020-07-22 LAB — BASIC METABOLIC PANEL
Anion gap: 6 (ref 5–15)
BUN: 33 mg/dL — ABNORMAL HIGH (ref 8–23)
CO2: 23 mmol/L (ref 22–32)
Calcium: 8.7 mg/dL — ABNORMAL LOW (ref 8.9–10.3)
Chloride: 108 mmol/L (ref 98–111)
Creatinine, Ser: 1.51 mg/dL — ABNORMAL HIGH (ref 0.44–1.00)
GFR, Estimated: 32 mL/min — ABNORMAL LOW (ref >60)
Glucose, Bld: 89 mg/dL (ref 70–99)
Potassium: 4 mmol/L (ref 3.5–5.1)
Sodium: 137 mmol/L (ref 135–145)

## 2020-07-22 LAB — CK: Total CK: 322 U/L — ABNORMAL HIGH (ref 38–234)

## 2020-07-22 LAB — CBC
HCT: 25.3 % — ABNORMAL LOW (ref 36.0–46.0)
Hemoglobin: 8.5 g/dL — ABNORMAL LOW (ref 12.0–15.0)
MCH: 31.7 pg (ref 26.0–34.0)
MCHC: 33.6 g/dL (ref 30.0–36.0)
MCV: 94.4 fL (ref 80.0–100.0)
Platelets: 116 10*3/uL — ABNORMAL LOW (ref 150–400)
RBC: 2.68 MIL/uL — ABNORMAL LOW (ref 3.87–5.11)
RDW: 13.6 % (ref 11.5–15.5)
WBC: 5.1 10*3/uL (ref 4.0–10.5)
nRBC: 0 % (ref 0.0–0.2)

## 2020-07-22 MED ORDER — ENSURE ENLIVE PO LIQD
237.0000 mL | Freq: Two times a day (BID) | ORAL | Status: DC
  Administered 2020-07-22 – 2020-07-24 (×4): 237 mL via ORAL

## 2020-07-22 MED ORDER — HYDROMORPHONE HCL 1 MG/ML IJ SOLN
0.5000 mg | INTRAMUSCULAR | Status: DC | PRN
Start: 2020-07-22 — End: 2020-07-24

## 2020-07-22 MED ORDER — ENSURE ENLIVE PO LIQD: 237.0000 mL | Freq: Three times a day (TID) | ORAL | Status: DC

## 2020-07-22 MED ORDER — ENOXAPARIN SODIUM 30 MG/0.3ML ~~LOC~~ SOLN
30.0000 mg | SUBCUTANEOUS | Status: DC
  Administered 2020-07-22 – 2020-07-23 (×2): 30 mg via SUBCUTANEOUS
  Filled 2020-07-22 (×2): qty 0.3

## 2020-07-22 MED ORDER — ADULT MULTIVITAMIN W/MINERALS CH
1.0000 | ORAL_TABLET | Freq: Every day | ORAL | Status: DC
  Administered 2020-07-22 – 2020-07-24 (×3): 1 via ORAL
  Filled 2020-07-22 (×3): qty 1

## 2020-07-22 NOTE — TOC Progression Note (Signed)
Transition of Care [facility]) - Progression Note    Patient Details  Name: [name] MRN: [id number] Date of Birth: 12-Nov-1924  Transition of Care [facility]) CM/SW [place], RN Phone Number: 07/22/2020, 4:43 PM  Clinical Narrative:   Met with the patient to discuss DC plan and needs, She lives alone, has family that comes on occasion.  She was independent prior to Hospitalization, She gives permission to complete a bedsearch, fl2 and PASSR, bedsearch FL2 and PASSR completed, will review bed offers once obtained         Expected Discharge Plan and Services                                                 Social Determinants of Health (SDOH) Interventions    Readmission Risk Interventions No flowsheet data found.

## 2020-07-22 NOTE — Progress Notes (Signed)
Initial Nutrition Assessment  DOCUMENTATION CODES:   Not applicable  INTERVENTION:   -Liberalize diet to regular -Ensure Enlive po BID, each supplement provides 350 kcal and 20 grams of protein -MVI with minerals daily  NUTRITION DIAGNOSIS:   Inadequate oral intake related to decreased appetite as evidenced by meal completion < 50%.  GOAL:   Patient will meet greater than or equal to 90% of their needs  MONITOR:   PO intake,Supplement acceptance,Labs,Weight trends,Skin,I & O's  REASON FOR ASSESSMENT:   Consult Assessment of nutrition requirement/status,Hip fracture protocol  ASSESSMENT:   MANJIT STIDD is a 87 y.o. female with medical history significant of HTN, dCHF, CKD-IIIa, depression with anxiety, diverticulitis, memory loss, who presents with fall and left groin pain.  Pt admitted with closed pelvic fracture.   Per MD notes pt awaiting ortho consult.   Spoke with pt at bedside, who reports fair appetite. She had multiple complaints about receiving food items she did not like; RD updated food preferences. Noted meal completion 45%. Pt was speaking to a family member on the phone at time of visit and unable to obtain further history at this time.   Reviewed wt hx; wt has been stable over the past year.   Pt with increased nutritional needs and would benefit from addition of oral nutrition supplements.   Labs reviewed.   NUTRITION - FOCUSED PHYSICAL EXAM:  Flowsheet Row Most Recent Value  Orbital Region Moderate depletion  Upper Arm Region No depletion  Thoracic and Lumbar Region No depletion  Buccal Region No depletion  Temple Region Mild depletion  Clavicle Bone Region Mild depletion  Clavicle and Acromion Bone Region No depletion  Scapular Bone Region No depletion  Dorsal Hand Mild depletion  Patellar Region No depletion  Anterior Thigh Region No depletion  Posterior Calf Region No depletion  Edema (RD Assessment) Mild  Hair Reviewed  Eyes Reviewed   Mouth Reviewed  Skin Reviewed  Nails Reviewed       Diet Order:   Diet Order            Diet Heart Room service appropriate? Yes; Fluid consistency: Thin  Diet effective now                 EDUCATION NEEDS:   No education needs have been identified at this time  Skin:  Skin Assessment: Reviewed RN Assessment  Last BM:  07/22/20  Height:   Ht Readings from Last 1 Encounters:  07/21/20 '5\' 4"'$  (1.626 m)    Weight:   Wt Readings from Last 1 Encounters:  07/21/20 55 kg    Ideal Body Weight:  54.5 kg  BMI:  Body mass index is 20.81 kg/m.  Estimated Nutritional Needs:   Kcal:  1450-1650  Protein:  65-80 grams  Fluid:  > 1.4 L    Loistine Chance, RD, LDN, [place] Registered Dietitian II Certified Diabetes Care and Education Specialist Please refer to [facility] for RD and/or RD on-call/weekend/after hours pager

## 2020-07-22 NOTE — Progress Notes (Signed)
PT Cancellation Note  Patient Details Name: [name] MRN: [id number] DOB: [birth year]   Cancelled Treatment:    Reason Eval/Treat Not Completed: Patient not medically ready. Patient has active bedrest orders and has ortho consult pending. Will evaluate when appropriate.     Vici Novick 07/22/2020, 9:56 AM

## 2020-07-22 NOTE — Progress Notes (Signed)
OT Cancellation Note  Patient Details Name: [name] MRN: [id number] DOB: 1924/08/09   Cancelled Treatment:    Reason Eval/Treat Not Completed: Active bedrest order;Other (comment). Consult received, chart reviewed. Pt noted with active bed rest order and orthopedic surgery consult pending. Will hold OT evaluation at this time and re-attempt pending updated plan of care and bed rest order completed.   Hanley Hays, MPH, MS, OTR/L ascom [phone] 07/22/20, 8:20 AM

## 2020-07-22 NOTE — Progress Notes (Signed)
PROGRESS NOTE    [name]  [id number] DOB: 08/31/1924 DOA: 07/21/2020 PCP: Margo Common, PA-C  Brief Narrative:  87 y.o. female with medical history significant of HTN, dCHF, CKD-IIIa, depression with anxiety, diverticulitis, memory loss, who presents with fall and left groin pain.  Per her granddaughter (I called her granddaughter by phone), patient fell when she was walking in the kitchen and her walker got caught on the edge of refrigerator yesterday. She landed on the left side, developed pain in the left groin area. The pain is constant, sharp, severe, nonradiating.  She is unable to bear weight on the left leg.  X-ray left pelvis showed displaced superior pubic ramus fracture and ischial fracture.  Orthopedic surgery has been consulted, recommendations appreciated.   Assessment & Plan:   Principal Problem:   Closed pelvic fracture ([place]) Active Problems:   Essential (primary) hypertension   Fall   Closed fracture of left superior pubic ramus (HCC)   Chronic diastolic CHF (congestive heart failure) (HCC)   CKD (chronic kidney disease), stage IIIa   Elevated CK  Closed pelvic fracture X-ray showed mildly displaced fractures of the left superior pubic ramus and ischium. Proximal femurs appear intact.  Patient has severe pain currently.  No neurovascular compromise.  Orthopedic surgeon, Dr. Harlow Mares was consulted.  Plan: Multimodal pain control PT and OT evaluations Follow-up orthopedic recommendations Anticipate patient will need skilled nursing facility at time of discharge.  Fall:  Mechanical fall.  Her walker apparently got caught on something in the kitchen No evidence of syncopal event, CT head imaging negative PT OT evaluations ordered and pending  Essential (primary) hypertension -IV hydralazine as needed -Continue home enalapril, metoprolol -hold torsemide due to worsening renal function  Chronic diastolic CHF (congestive heart failure)  ([place])  2D echo on 08/17/2017 showed EF of 65-70% with grade 1 diastolic dysfunction.  Patient does not have leg edema.  No JVD.  No shortness of breath.  CHF seem to be compensated. Plan: -hold torsemide due to worsening renal function  AKI on CKD (chronic kidney disease), stage IIIa: Recent baseline creatinine 1.28 on 06/21/2020.   Creatinine worsening over interval Plan: Hold IV fluids Hold torsemide -hold torsemide -IV fluid 500 cc normal saline  Elevated CK CK level 430 on admission, now downtrending    DVT prophylaxis: SQ Lovenox Code Status: DNR Family Communication: Granddaughter [place] [phone] on 4/11 Disposition Plan: Status is: Observation  The patient will require care spanning > 2 midnights and should be moved to inpatient because: Unsafe d/c plan and Inpatient level of care appropriate due to severity of illness  Dispo: The patient is from: Home              Anticipated d/c is to: SNF              Patient currently is not medically stable to d/c.   Difficult to place patient No  Pelvic fracture.  Pending orthopedic evaluation and recommendations.  Anticipate need for skilled nursing facility at time of discharge.     Level of care: Med-Surg  Consultants:   Orthopedic surgery   Procedures: None  Antimicrobials:   None   Subjective: Patient seen and examined.  Slightly improved pain control over interval.  Pain only present when she is moving.  Objective: Vitals:   07/21/20 1642 07/21/20 2020 07/22/20 0527 07/22/20 0827  BP: (!) 178/88 (!) 142/48 (!) 142/55 (!) 130/59  Pulse: 68 73 60 63  Resp: '15 18 17 '$ 18  Temp: (!) 97.5 F (36.4 C) 98.2 F (36.8 C) 97.6 F (36.4 C) 98.8 F (37.1 C)  TempSrc:      SpO2: 98% 99% 97% 97%  Weight:      Height:        Intake/Output Summary (Last 24 hours) at 07/22/2020 1427 Last data filed at 07/22/2020 1345 Gross per 24 hour  Intake 240 ml  Output --  Net 240 ml   Filed Weights   07/21/20 1339   Weight: 55 kg    Examination:  General exam: Appears calm and comfortable  Respiratory system: Clear to auscultation. Respiratory effort normal. Cardiovascular system: S1 & S2 heard, RRR. No JVD, murmurs, rubs, gallops or clicks. No pedal edema. Gastrointestinal system: Abdomen is nondistended, soft and nontender. No organomegaly or masses felt. Normal bowel sounds heard. Central nervous system: Alert, oriented x2. No focal neurological deficits. Extremities: Limited range of motion bilateral lower extremities.  Pain on palpation of left hip Skin: No rashes, lesions or ulcers Psychiatry: Judgement and insight appear normal. Mood & affect appropriate.     Data Reviewed: I have personally reviewed following labs and imaging studies  CBC: Recent Labs  Lab 07/21/20 1447 07/22/20 0559  WBC 9.3 5.1  NEUTROABS 7.3  --   HGB 10.2* 8.5*  HCT 30.9* 25.3*  MCV 94.8 94.4  PLT 134* [id number]*   Basic Metabolic Panel: Recent Labs  Lab 07/21/20 1447 07/22/20 0559  NA 137 137  K 4.0 4.0  CL 106 108  CO2 25 23  GLUCOSE 99 89  BUN 37* 33*  CREATININE 1.47* 1.51*  CALCIUM 9.6 8.7*   GFR: Estimated Creatinine Clearance: 20.1 mL/min (A) (by C-G formula based on SCr of 1.51 mg/dL (H)). Liver Function Tests: Recent Labs  Lab 07/21/20 1447  AST 33  ALT 15  ALKPHOS 71  BILITOT 1.0  PROT 7.3  ALBUMIN 4.2   No results for input(s): LIPASE, AMYLASE in the last 168 hours. No results for input(s): AMMONIA in the last 168 hours. Coagulation Profile: Recent Labs  Lab 07/21/20 1447  INR 1.1   Cardiac Enzymes: Recent Labs  Lab 07/21/20 1447 07/22/20 0559  CKTOTAL 430* 322*   BNP (last 3 results) No results for input(s): PROBNP in the last 8760 hours. HbA1C: No results for input(s): HGBA1C in the last 72 hours. CBG: No results for input(s): GLUCAP in the last 168 hours. Lipid Profile: No results for input(s): CHOL, HDL, LDLCALC, TRIG, CHOLHDL, LDLDIRECT in the last 72  hours. Thyroid Function Tests: No results for input(s): TSH, T4TOTAL, FREET4, T3FREE, THYROIDAB in the last 72 hours. Anemia Panel: No results for input(s): VITAMINB12, FOLATE, FERRITIN, TIBC, IRON, RETICCTPCT in the last 72 hours. Sepsis Labs: No results for input(s): PROCALCITON, LATICACIDVEN in the last 168 hours.  Recent Results (from the past 240 hour(s))  Resp Panel by RT-PCR (Flu A&B, Covid) Nasopharyngeal Swab     Status: None   Collection Time: 07/21/20  3:18 PM   Specimen: Nasopharyngeal Swab; Nasopharyngeal(NP) swabs in vial transport medium  Result Value Ref Range Status   SARS Coronavirus 2 by RT PCR NEGATIVE NEGATIVE Final    Comment: (NOTE) SARS-CoV-2 target nucleic acids are NOT DETECTED.  The SARS-CoV-2 RNA is generally detectable in upper respiratory specimens during the acute phase of infection. The lowest concentration of SARS-CoV-2 viral copies this assay can detect is 138 copies/mL. A negative result does not preclude SARS-Cov-2 infection and should not be used as the sole basis for treatment or  other patient management decisions. A negative result may occur with  improper specimen collection/handling, submission of specimen other than nasopharyngeal swab, presence of viral mutation(s) within the areas targeted by this assay, and inadequate number of viral copies(<138 copies/mL). A negative result must be combined with clinical observations, patient history, and epidemiological information. The expected result is Negative.  Fact Sheet for Patients:  EntrepreneurPulse.com.au  Fact Sheet for Healthcare Providers:  IncredibleEmployment.be  This test is no t yet approved or cleared by the [place] FDA and  has been authorized for detection and/or diagnosis of SARS-CoV-2 by FDA under an Emergency Use Authorization (EUA). This EUA will remain  in effect (meaning this test can be used) for the duration of the COVID-19  declaration under Section 564(b)(1) of the Act, 21 U.S.C.section 360bbb-3(b)(1), unless the authorization is terminated  or revoked sooner.       Influenza A by PCR NEGATIVE NEGATIVE Final   Influenza B by PCR NEGATIVE NEGATIVE Final    Comment: (NOTE) The Xpert Xpress SARS-CoV-2/FLU/RSV plus assay is intended as an aid in the diagnosis of influenza from Nasopharyngeal swab specimens and should not be used as a sole basis for treatment. Nasal washings and aspirates are unacceptable for Xpert Xpress SARS-CoV-2/FLU/RSV testing.  Fact Sheet for Patients: EntrepreneurPulse.com.au  Fact Sheet for Healthcare Providers: IncredibleEmployment.be  This test is not yet approved or cleared by the [place] FDA and has been authorized for detection and/or diagnosis of SARS-CoV-2 by FDA under an Emergency Use Authorization (EUA). This EUA will remain in effect (meaning this test can be used) for the duration of the COVID-19 declaration under Section 564(b)(1) of the Act, 21 U.S.C. section 360bbb-3(b)(1), unless the authorization is terminated or revoked.  Performed at [facility], [address], [place], [place] [address]          Radiology Studies: DG Chest 1 View  Result Date: 07/21/2020 CLINICAL DATA:  Pain fall going fall EXAM: CHEST  1 VIEW COMPARISON:  August 09, 2013 FINDINGS: Calcified granuloma noted in left lower lung region. No edema or airspace opacity. Heart size and pulmonary vascularity are normal. No adenopathy. Bones are diffusely osteoporotic. No pneumothorax. IMPRESSION: Granuloma left lower lobe. No edema or airspace opacity. Cardiac silhouette normal. Bones osteoporotic. Electronically Signed   By: Lowella Grip III M.D.   On: 07/21/2020 15:08   CT HEAD WO CONTRAST  Result Date: 07/21/2020 CLINICAL DATA:  Head trauma.  Recent fall. EXAM: CT HEAD WITHOUT CONTRAST TECHNIQUE: Contiguous axial images were obtained  from the base of the skull through the vertex without intravenous contrast. COMPARISON:  CT head 07/21/2015 FINDINGS: Brain: Generalized atrophy, with progression. Mild patchy white matter hypodensity bilaterally, with progression. Negative for hydrocephalus. Negative for acute infarct, hemorrhage, mass. Vascular: Negative for hyperdense vessel Skull: Negative Sinuses/Orbits: Extensive prior sinus surgery. Mild mucosal edema. Bilateral cataract extraction Other: None IMPRESSION: No acute abnormality. Atrophy and chronic microvascular ischemic change, with progression since 2017. Electronically Signed   By: Franchot Gallo M.D.   On: 07/21/2020 18:44   DG Hip Unilat With Pelvis 2-3 Views Left  Result Date: 07/21/2020 CLINICAL DATA:  Pain following fall EXAM: DG HIP (WITH OR WITHOUT PELVIS) 2-3V LEFT COMPARISON:  None. FINDINGS: Frontal pelvis as well as frontal and lateral left hip images were obtained. There is diffuse osteoporosis. There is a fracture of the midportion of the left superior pubic ramus as well as a fracture of the mid left ischium with mild displacement of fracture  fragments in these areas. No other appreciable fractures. No dislocations. Moderate symmetric narrowing of each hip joint noted. Postoperative change in each pubic symphysis. Degenerative change noted in the visualized lumbar spine. Foci of arterial vascular calcification noted in each thigh region. IMPRESSION: Mildly displaced fractures of the left superior pubic ramus and ischium. Proximal femurs appear intact. No dislocation. Moderate symmetric narrowing of each hip joint noted. Bones osteoporotic. Electronically Signed   By: Lowella Grip III M.D.   On: 07/21/2020 15:07        Scheduled Meds: . enalapril  10 mg Oral Daily  . enoxaparin (LOVENOX) injection  30 mg Subcutaneous Q24H  . feeding supplement  237 mL Oral BID BM  . metoprolol tartrate  12.5 mg Oral BID  . multivitamin with minerals  1 tablet Oral Daily    Continuous Infusions:   LOS: 0 days    Time spent: 25 minutes    Sidney Ace, MD [facility] Pager [phone]  If 7PM-7AM, please contact night-coverage 07/22/2020, 2:27 PM

## 2020-07-22 NOTE — NC FL2 (Signed)
[place] LEVEL OF CARE SCREENING TOOL     IDENTIFICATION  Patient Name: [name] Birthdate: 10-14-1924 Sex: female Admission Date (Current Location): 07/21/2020  [place] and [place] Number:  Engineering geologist and Address:  [facility], [address] [place], [place] [address]      Provider Number: [id number]  Attending Physician Name and Address:  Sidney Ace, MD  Relative Name and Phone Number:       Current Level of Care: Hospital Recommended Level of Care: [place] Prior Approval Number:    Date Approved/Denied:   PASRR Number: [id number]  Discharge Plan: SNF    Current Diagnoses: Patient Active Problem List   Diagnosis Date Noted  . Closed fracture of left superior pubic ramus ([place]) 07/21/2020  . Closed pelvic fracture ([place]) 07/21/2020  . Chronic diastolic CHF (congestive heart failure) ([place]) 07/21/2020  . Fall at home, initial encounter 07/21/2020  . CKD (chronic kidney disease), stage IIIa 07/21/2020  . Elevated CK 07/21/2020  . Syncope 08/17/2017  . Hyponatremia 01/11/2017  . Arthritis of both hands 01/14/2016  . Acute UTI 08/21/2015  . Scalp laceration 07/29/2015  . Fall 07/29/2015  . Near syncope 07/21/2015  . Foot pain 12/31/2014  . Anxiety 10/17/2014  . Allergic rhinitis 10/16/2014  . Airway hyperreactivity 10/16/2014  . Biliary calculi, common bile duct 10/16/2014  . Clinical depression 10/16/2014  . 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 Clicking shoulder [id number]  . Essential (primary) hypertension 10/16/2014  . H/O gastrointestinal hemorrhage 10/16/2014  . 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 Below normal amount of sodium in the blood 10/16/2014  . Insomnia 10/16/2014  . Bad memory 10/16/2014  . OP (osteoporosis) 10/16/2014  . Cyst of pancreas 10/16/2014  . Temporary cerebral vascular dysfunction 10/16/2014  . Other constipation 03/07/2014  . Right groin pain  03/07/2014  . Internal hemorrhoid 03/07/2014  . Gallstones, common bile duct 08/29/2013  . Cholelithiasis 08/29/2013  . CYST AND PSEUDOCYST OF PANCREAS 03/12/2008    Orientation RESPIRATION BLADDER Height & Weight     Self,Time,Situation,Place  Normal Continent Weight: 55 kg Height:  '5\' 4"'$  (162.6 cm)  BEHAVIORAL SYMPTOMS/MOOD NEUROLOGICAL BOWEL NUTRITION STATUS      Continent Diet (regular)  AMBULATORY STATUS COMMUNICATION OF NEEDS Skin   Extensive Assist Verbally Normal                       Personal Care Assistance Level of Assistance  Bathing,Dressing Bathing Assistance: Limited assistance   Dressing Assistance: Limited assistance     Functional Limitations Info  Hearing   Hearing Info: Impaired      SPECIAL CARE FACTORS FREQUENCY  PT (By licensed PT)     PT Frequency: 5 days per week              Contractures Contractures Info: Not present    Additional Factors Info  Code Status,Allergies Code Status Info: DNR Allergies Info: Accolate Zafirlukast, Bactrim Sulfamethoxazole-trimethoprim, Metronidazole, Nitrofurantoin Monohyd Macro, Penicillins, Keflex Cephalexin           Current Medications (07/22/2020):  This is the current hospital active medication list Current Facility-Administered Medications  Medication Dose Route Frequency Provider Last Rate Last Admin  . acetaminophen (TYLENOL) tablet 650 mg  650 mg Oral Q6H PRN Ivor Costa, MD   650 mg at 07/22/20 1639  . enalapril (VASOTEC) tablet 10 mg  10 mg Oral Daily Ivor Costa, MD   10 mg at 07/22/20  0920  . enoxaparin (LOVENOX) injection 30 mg  30 mg Subcutaneous Q24H Renda Rolls, RPH      . feeding supplement (ENSURE ENLIVE / ENSURE PLUS) liquid 237 mL  237 mL Oral BID BM Sreenath, Sudheer B, MD   237 mL at 07/22/20 1525  . hydrALAZINE (APRESOLINE) injection 5 mg  5 mg Intravenous Q2H PRN Ivor Costa, MD      . HYDROmorphone (DILAUDID) injection 0.5 mg  0.5 mg Intravenous Q4H PRN Sreenath, Sudheer  B, MD      . methocarbamol (ROBAXIN) tablet 500 mg  500 mg Oral Q8H PRN Ivor Costa, MD      . metoprolol tartrate (LOPRESSOR) tablet 12.5 mg  12.5 mg Oral BID Ivor Costa, MD   12.5 mg at 07/22/20 0919  . multivitamin with minerals tablet 1 tablet  1 tablet Oral Daily Ralene Muskrat B, MD   1 tablet at 07/22/20 1525  . ondansetron (ZOFRAN) injection 4 mg  4 mg Intravenous Q8H PRN Ivor Costa, MD      . oxyCODONE-acetaminophen (PERCOCET/ROXICET) 5-325 MG per tablet 1 tablet  1 tablet Oral Q4H PRN Ivor Costa, MD      . senna-docusate (Senokot-S) tablet 1 tablet  1 tablet Oral QHS PRN Ivor Costa, MD      . traZODone (DESYREL) tablet 25 mg  25 mg Oral QHS PRN Ivor Costa, MD         Discharge Medications: Please see discharge summary for a list of discharge medications.  Relevant Imaging Results:  Relevant Lab Results:   Additional Information SS# [id number]  Su Hilt, RN

## 2020-07-22 NOTE — Progress Notes (Signed)
PHARMACIST - PHYSICIAN COMMUNICATION  CONCERNING:  Enoxaparin (Lovenox) for DVT Prophylaxis    RECOMMENDATION: Patient was prescribed enoxaprin '40mg'$  q24 hours for VTE prophylaxis.   Filed Weights   07/21/20 1339  Weight: 55 kg (121 lb 4.1 oz)    Body mass index is 20.81 kg/m.  Estimated Creatinine Clearance: 20.6 mL/min (A) (by C-G formula based on SCr of 1.47 mg/dL (H)).   Based on [place] patient is candidate for enoxaparin 0.'5mg'$ /kg TBW SQ every 24 hours based on BMI being >30.  DESCRIPTION: Pharmacy has adjusted enoxaparin dose per [facility] policy.  Patient is now receiving enoxaparin 30 mg every 24 hours   Renda Rolls, PharmD, [facility] 07/22/2020 3:45 AM

## 2020-07-22 NOTE — Evaluation (Signed)
Physical Therapy Evaluation Patient Details Name: [name] MRN: [id number] DOB: 01-Jan-1925 Today's Date: 07/22/2020   History of Present Illness  [name] is a 87 y.o. female with medical history significant of HTN, dCHF, CKD-IIIa, depression with anxiety, diverticulitis, memory loss, who presents with fall and left groin pain.     Per her granddaughter (I called her granddaughter by phone), patient fell when she was walking in the kitchen and her walker got caught on the edge of refrigerator yesterday. She landed on the left side, developed pain in the left groin area. The pain is constant, sharp, severe, nonradiating.  She is unable to bear weight on the left leg.  Patient found to have L pubic ramus and ischium fx. WBAT.    Clinical Impression  Patient received in bed, RN present in room. Patient reports she is "crazy". I think she is a bit disoriented at times, but is able to follow direction and answer questions appropriately. Patient requires min assist for supine to sit. Min assist for sit to stand and min guard with ambulation for bed to recliner. Patient is pain limited but able to shift weight well to take steps this pm. Patient will continue to benefit from skilled PT while here to improve functional independence, safety and strength.         Follow Up Recommendations SNF    Equipment Recommendations  None recommended by PT    Recommendations for Other Services       Precautions / Restrictions Precautions Precautions: Fall Restrictions Weight Bearing Restrictions: No Other Position/Activity Restrictions: WBAT      Mobility  Bed Mobility Overal bed mobility: Needs Assistance Bed Mobility: Supine to Sit     Supine to sit: Min assist     General bed mobility comments: min assist to raise trunk to sitting position. Min assist with legs bringing them off bed.    Transfers Overall transfer level: Needs assistance Equipment used: Rolling walker (2  wheeled) Transfers: Sit to/from Stand Sit to Stand: Min assist         General transfer comment: patient requires cues for hand placement  Ambulation/Gait Ambulation/Gait assistance: Min guard Gait Distance (Feet): 4 Feet   Gait Pattern/deviations: Step-to pattern;Decreased step length - right;Decreased step length - left;Decreased weight shift to left Gait velocity: decr   General Gait Details: patient pain limited with ambulation, but able to take good steps to recliner from bed.  Stairs            Wheelchair Mobility    Modified Rankin (Stroke Patients Only)       Balance Overall balance assessment: Needs assistance Sitting-balance support: Feet supported Sitting balance-Leahy Scale: Good     Standing balance support: Bilateral upper extremity supported;During functional activity Standing balance-Leahy Scale: Fair Standing balance comment: reliant on RW and min guard                             Pertinent Vitals/Pain Pain Assessment: Faces Faces Pain Scale: Hurts whole lot Pain Location: L LE Pain Descriptors / Indicators: Aching;Discomfort;Sore Pain Intervention(s): Limited activity within patient's tolerance;Monitored during session;Repositioned    Home Living Family/patient expects to be discharged to:: Skilled nursing facility Living Arrangements: Alone                    Prior Function Level of Independence: Independent with assistive device(s)         Comments: patient reports  she walks with walker. Some confusion this visit. [place] Dominance        Extremity/Trunk Assessment   Upper Extremity Assessment Upper Extremity Assessment: Overall WFL for tasks assessed    Lower Extremity Assessment Lower Extremity Assessment: Generalized weakness    Cervical / Trunk Assessment Cervical / Trunk Assessment: Normal  Communication   Communication: No difficulties  Cognition Arousal/Alertness: Awake/alert Behavior  During Therapy: WFL for tasks assessed/performed Overall Cognitive Status: Within Functional Limits for tasks assessed                                        General Comments      Exercises     Assessment/Plan    PT Assessment Patient needs continued PT services  PT Problem List Decreased strength;Decreased mobility;Decreased activity tolerance;Decreased balance;Pain       PT Treatment Interventions DME instruction;Therapeutic exercise;Gait training;Balance training;Stair training;Functional mobility training;Therapeutic activities;Patient/family education    PT Goals (Current goals can be found in the Care Plan section)  Acute Rehab PT Goals Patient Stated Goal: to have less pain PT Goal Formulation: With patient Time For Goal Achievement: 07/29/20 Potential to Achieve Goals: Good    Frequency 7X/week   Barriers to discharge Decreased caregiver support      Co-evaluation               AM-PAC PT "6 Clicks" Mobility  Outcome Measure Help needed turning from your back to your side while in a flat bed without using bedrails?: A Little Help needed moving from lying on your back to sitting on the side of a flat bed without using bedrails?: A Little Help needed moving to and from a bed to a chair (including a wheelchair)?: A Little Help needed standing up from a chair using your arms (e.g., wheelchair or bedside chair)?: A Little Help needed to walk in hospital room?: A Lot Help needed climbing 3-5 steps with a railing? : A Lot 6 Click Score: 16    End of Session Equipment Utilized During Treatment: Gait belt Activity Tolerance: Patient limited by pain Patient left: in chair;with call bell/phone within reach;with chair alarm set Nurse Communication: Mobility status PT Visit Diagnosis: History of falling (Z91.81);Pain;Difficulty in walking, not elsewhere classified (R26.2);Other abnormalities of gait and mobility (R26.89) Pain - Right/Left: Left     Time: [id number] PT Time Calculation (min) (ACUTE ONLY): 15 min   Charges:   PT Evaluation $PT Eval Moderate Complexity: 1 Mod          Murlean Seelye, PT, GCS 07/22/20,4:03 PM

## 2020-07-22 NOTE — Evaluation (Signed)
Occupational Therapy Evaluation Patient Details Name: [name] LAMOUREAUX MRN: [id number] DOB: Nov 24, 1924 Today's Date: 07/22/2020    History of Present Illness [name] is a 87 y.o. female with medical history significant of HTN, dCHF, CKD-IIIa, depression with anxiety, diverticulitis, memory loss, who presents with fall and left groin pain.     Per her granddaughter (I called her granddaughter by phone), patient fell when she was walking in the kitchen and her walker got caught on the edge of refrigerator yesterday. She landed on the left side, developed pain in the left groin area. The pain is constant, sharp, severe, nonradiating.  She is unable to bear weight on the left leg.  Patient found to have L pubic ramus and ischium fx. WBAT.   Clinical Impression   Pt was seen for OT evaluation this date. Prior to hospital admission, pt reports ambulating with a 2WW in her home. She reports independent with bathing using a tub transfer bench and light meal prep. She reports her granddaughters assist with transportation and medication mgt/set up. Pt lives by herself, reporting her spouse passed away (timing questionable as pt reports recently passing but later states he passed away a few years ago). No family present to verify information provided. Currently pt demonstrates impairments as described below (See OT problem list) which functionally limit her ability to perform ADL/self-care tasks. Pt currently demonstrates impaired BLE Strength, balance, activity tolerance, safety awareness, and significant pelvic pain, requiring MAX A for LB ADL tasks, PRN MIN A for UB ADL while in seated position. Pt educated in AE to support improved access to LB for ADL. Pt would benefit from skilled OT services to address noted impairments and functional limitations (see below for any additional details) in order to maximize safety and independence while minimizing falls risk and caregiver burden. Upon hospital discharge,  recommend STR to maximize pt safety and return to PLOF.     Follow Up Recommendations  SNF    Equipment Recommendations  3 in 1 bedside commode    Recommendations for Other Services       Precautions / Restrictions Precautions Precautions: Fall Restrictions Weight Bearing Restrictions: No Other Position/Activity Restrictions: WBAT, per secure chat w/ MD      Mobility Bed Mobility Overal bed mobility: Needs Assistance Bed Mobility: Supine to Sit     Supine to sit: Min assist     General bed mobility comments: NT,  up in recliner    Transfers Overall transfer level: Needs assistance Equipment used: Rolling walker (2 wheeled) Transfers: Sit to/from Stand Sit to Stand: Min assist         General transfer comment: deferred, pt endorses significant pain with mobility    Balance Overall balance assessment: Needs assistance Sitting-balance support: Feet supported Sitting balance-Leahy Scale: Good     Standing balance support: Bilateral upper extremity supported;During functional activity Standing balance-Leahy Scale: Fair Standing balance comment: reliant on RW and min guard                           ADL either performed or assessed with clinical judgement   ADL Overall ADL's : Needs assistance/impaired                                       General ADL Comments: Significant pain and BLE weakness contributing to pt's difficulty with ADL, especially LB  ADL tasks which she currently requires MAX A to perform, set up for seated grooming, PRN MIN A for UB bathing/dressing     Vision Patient Visual Report: No change from baseline       Perception     Praxis      Pertinent Vitals/Pain Pain Assessment: Faces Faces Pain Scale: Hurts a little bit Pain Location: pt visually uncomfortable at rest, endorses significant pain with WBing/mobility Pain Descriptors / Indicators: Aching;Discomfort;Sore Pain Intervention(s): Limited activity  within patient's tolerance;Monitored during session     Hand Dominance Right   Extremity/Trunk Assessment Upper Extremity Assessment Upper Extremity Assessment: Overall WFL for tasks assessed (arthritic changes noted in bilat hands/fingers)   Lower Extremity Assessment Lower Extremity Assessment: Generalized weakness   Cervical / Trunk Assessment Cervical / Trunk Assessment: Normal   Communication Communication Communication: HOH   Cognition Arousal/Alertness: Awake/alert Behavior During Therapy: WFL for tasks assessed/performed Overall Cognitive Status: No family/caregiver present to determine baseline cognitive functioning                                 General Comments: Pt alert and generally oriented, although does state that at times she will look around the room and "gets tangled up in this room" (after additional questioning, appears to mean she gets confused or disoriented to where she is); pt follows commands well, appears to demonstrate difficulty with STM (reports her spouse passed away 2 weeks ago, later says ~89moago, and then later states he passed a few years ago)   General Comments       Exercises Other Exercises Other Exercises: Pt educated in AE for LB ADL to improve access and independence while minimizing falls risk and pain with hip flexion   Shoulder Instructions      Home Living Family/patient expects to be discharged to:: Private residence Living Arrangements: Alone Available Help at Discharge: Family;Available PRN/intermittently Type of Home: House       Home Layout: One level     Bathroom Shower/Tub: TTeacher, early years/pre Handicapped height     Home Equipment: WEnvironmental consultant- 2 wheels;Tub bench;Adaptive equipment Adaptive Equipment: Reacher Additional Comments: No family present, pt is questionable historian      Prior Functioning/Environment Level of Independence: Needs assistance  Gait / Transfers Assistance  Needed: Pt reports she amb w/ RW, denies additional falls in past yr ADL's / Homemaking Assistance Needed: Pt reports generally independent with basic ADL, light meal prep, granddaughter sets up meds and provides transportation   Comments: patient reports she walks with walker. Some confusion this visit. .        OT Problem List: Decreased strength;Pain;Decreased range of motion;Decreased cognition;Decreased safety awareness;Decreased activity tolerance;Impaired balance (sitting and/or standing);Decreased knowledge of use of DME or AE      OT Treatment/Interventions: Self-care/ADL training;Therapeutic exercise;Therapeutic activities;Cognitive remediation/compensation;DME and/or AE instruction;Patient/family education;Balance training;Energy conservation    OT Goals(Current goals can be found in the care plan section) Acute Rehab OT Goals Patient Stated Goal: to have less pain OT Goal Formulation: With patient Time For Goal Achievement: 08/05/20 Potential to Achieve Goals: Good ADL Goals Pt Will Perform Grooming: with set-up;with supervision;sitting Pt Will Perform Lower Body Dressing: with min assist;sit to/from stand;with adaptive equipment Pt Will Transfer to Toilet: with min guard assist;ambulating;bedside commode (LRAD for amb) Additional ADL Goal #1: Pt will verbalize plan to implement at least 1 learned falls prevention strategy to maximize safety/independence  OT Frequency: Min 2X/week   Barriers to D/C:            Co-evaluation              AM-PAC OT "6 Clicks" Daily Activity     Outcome Measure Help from another person eating meals?: None Help from another person taking care of personal grooming?: A Little Help from another person toileting, which includes using toliet, bedpan, or urinal?: A Lot Help from another person bathing (including washing, rinsing, drying)?: A Lot Help from another person to put on and taking off regular upper body clothing?: A Little Help  from another person to put on and taking off regular lower body clothing?: A Lot 6 Click Score: 16   End of Session    Activity Tolerance: Patient tolerated treatment well Patient left: in chair;with call bell/phone within reach;with chair alarm set  OT Visit Diagnosis: Other abnormalities of gait and mobility (R26.89);Muscle weakness (generalized) (M62.81);History of falling (Z91.81);Pain Pain - Right/Left: Left (bilat) Pain - part of body: Hip                Time: [id number] OT Time Calculation (min): 19 min Charges:  OT General Charges $OT Visit: 1 Visit OT Evaluation $OT Eval Moderate Complexity: 1 Mod OT Treatments $Self Care/Home Management : 8-22 mins  Hanley Hays, MPH, MS, OTR/L ascom [phone] 07/22/20, 4:43 PM

## 2020-07-23 DIAGNOSIS — S32502A Unspecified fracture of left pubis, initial encounter for closed fracture: Secondary | ICD-10-CM | POA: Diagnosis not present

## 2020-07-23 MED ORDER — GABAPENTIN 100 MG PO CAPS
300.0000 mg | ORAL_CAPSULE | Freq: Two times a day (BID) | ORAL | Status: DC
  Administered 2020-07-23 – 2020-07-24 (×3): 300 mg via ORAL
  Filled 2020-07-23 (×3): qty 1

## 2020-07-23 NOTE — Progress Notes (Signed)
Physical Therapy Treatment Patient Details Name: [name] MRN: [id number] DOB: 02-06-1925 Today's Date: 07/23/2020    History of Present Illness [name] is a 87 y.o. female with medical history significant of HTN, dCHF, CKD-IIIa, depression with anxiety, diverticulitis, memory loss, who presents with fall and left groin pain.     Per her granddaughter (I called her granddaughter by phone), patient fell when she was walking in the kitchen and her walker got caught on the edge of refrigerator yesterday. She landed on the left side, developed pain in the left groin area. The pain is constant, sharp, severe, nonradiating.  She is unable to bear weight on the left leg.  Patient found to have L pubic ramus and ischium fx. WBAT.    PT Comments    Patient agreeable to PT. Patient reports she has no pain at rest and reports moderate pain in left groin with mobility, especially with weight bearing. Patient continues to need assistance for basic mobility and to take several side steps along edge of bed with rolling walker. Overall activity tolerance limited by pain. Patient declined ice pack when offered. Recommend PT to maximize independence with discharge to SNF.      Follow Up Recommendations  SNF     Equipment Recommendations  None recommended by PT    Recommendations for Other Services       Precautions / Restrictions Precautions Precautions: Fall Restrictions Weight Bearing Restrictions: Yes (WBAT BLE)    Mobility  Bed Mobility Overal bed mobility: Needs Assistance Bed Mobility: Supine to Sit;Sit to Supine     Supine to sit: Min assist Sit to supine: Min assist   General bed mobility comments: assistance for LLE support. verbal cues for technique. extra time required to complete tasks    Transfers Overall transfer level: Needs assistance Equipment used: Rolling walker (2 wheeled) Transfers: Sit to/from Stand Sit to Stand: Min assist         General transfer  comment: lifting assistance required for standing. verbal cues for technique  Ambulation/Gait Ambulation/Gait assistance: Min assist   Assistive device: Rolling walker (2 wheeled) Gait Pattern/deviations: Antalgic Gait velocity: decreased   General Gait Details: patient ambulated 3 side steps along edge of bed with rolling walker with minimal assistance for weight shifting assistance. verbal cues for technique using rolling walker to off load LLE as patient reports the most pain with weight bearing on LLE.   Stairs             Wheelchair Mobility    Modified Rankin (Stroke Patients Only)       Balance Overall balance assessment: Needs assistance Sitting-balance support: Feet supported Sitting balance-Leahy Scale: Good     Standing balance support: Bilateral upper extremity supported Standing balance-Leahy Scale: Fair Standing balance comment: patient relying heavily on rolling walker for support                            Cognition Arousal/Alertness: Awake/alert Behavior During Therapy: WFL for tasks assessed/performed Overall Cognitive Status: No family/caregiver present to determine baseline cognitive functioning                                        Exercises      General Comments        Pertinent Vitals/Pain Pain Assessment: Faces Faces Pain Scale: Hurts even more Pain  Location: left groin Pain Descriptors / Indicators: Discomfort Pain Intervention(s): Limited activity within patient's tolerance    Home Living                      Prior Function            PT Goals (current goals can now be found in the care plan section) Acute Rehab PT Goals Patient Stated Goal: more independence PT Goal Formulation: With patient Time For Goal Achievement: 07/29/20 Potential to Achieve Goals: Good Progress towards PT goals: Progressing toward goals    Frequency    7X/week      PT Plan Current plan remains  appropriate    Co-evaluation              AM-PAC PT "6 Clicks" Mobility   Outcome Measure  Help needed turning from your back to your side while in a flat bed without using bedrails?: A Little Help needed moving from lying on your back to sitting on the side of a flat bed without using bedrails?: A Little Help needed moving to and from a bed to a chair (including a wheelchair)?: A Little Help needed standing up from a chair using your arms (e.g., wheelchair or bedside chair)?: A Little Help needed to walk in hospital room?: A Lot Help needed climbing 3-5 steps with a railing? : A Lot 6 Click Score: 16    End of Session Equipment Utilized During Treatment: Gait belt Activity Tolerance: Patient limited by pain Patient left: in bed;with bed alarm set;with call bell/phone within reach Nurse Communication: Mobility status PT Visit Diagnosis: History of falling (Z91.81);Pain;Difficulty in walking, not elsewhere classified (R26.2);Other abnormalities of gait and mobility (R26.89) Pain - Right/Left: Left Pain - part of body:  (groin)     Time: [id number] PT Time Calculation (min) (ACUTE ONLY): 27 min  Charges:  $Therapeutic Activity: 23-37 mins                    Minna Merritts, PT, MPT    Percell Locus 07/23/2020, 1:09 PM

## 2020-07-23 NOTE — Consult Note (Signed)
ORTHOPAEDIC CONSULTATION  REQUESTING PHYSICIAN: Sidney Ace, MD  Chief Complaint: left hip pain  HPI: [name] is a 87 y.o. female who complains of left hip pain. Please see H&P and ED notes for details. Denies any numbness, tingling or constitutional symptoms.  Past Medical History:  Diagnosis Date  . Breast cyst    removed  . Diverticulitis   . Gallstones   . Hypertension   . Low sodium levels    Past Surgical History:  Procedure Laterality Date  . ABDOMINAL HYSTERECTOMY  2000  . APPENDECTOMY  1940's  . bladder tack  1990's  . BREAST CYST INCISION AND DRAINAGE Left 1990's  . CHOLECYSTECTOMY  09/11/13   Cholangiogram suggested a retained stone ductal dilatation.  Marland Kitchen ERCP W/ SPHICTEROTOMY  09/12/2013   Recurrent stone identified post cholecystectomy.  Marland Kitchen NASAL SINUS SURGERY     Social History   Socioeconomic History  . Marital status: Widowed    Spouse name: Not on file  . Number of children: 1  . Years of education: Not on file  . Highest education level: 12th grade  Occupational History  . Occupation: retired  Tobacco Use  . Smoking status: Never Smoker  . Smokeless tobacco: Never Used  Vaping Use  . Vaping Use: Never used  Substance and Sexual Activity  . Alcohol use: No  . Drug use: No  . Sexual activity: Not Currently  Other Topics Concern  . Not on file  Social History Narrative  . Not on file   Social Determinants of Health   Financial Resource Strain: Low Risk   . Difficulty of Paying Living Expenses: Not hard at all  Food Insecurity: No Food Insecurity  . Worried About Charity fundraiser in the Last Year: Never true  . Ran Out of Food in the Last Year: Never true  Transportation Needs: No Transportation Needs  . Lack of Transportation (Medical): No  . Lack of Transportation (Non-Medical): No  Physical Activity: Inactive  . Days of Exercise per Week: 0 days  . Minutes of Exercise per Session: 0 min  Stress: No Stress Concern  Present  . Feeling of Stress : Not at all  Social Connections: Socially Isolated  . Frequency of Communication with Friends and Family: More than three times a week  . Frequency of Social Gatherings with Friends and Family: More than three times a week  . Attends Religious Services: Never  . Active Member of Clubs or Organizations: No  . Attends Archivist Meetings: Never  . Marital Status: Widowed   Family History  Problem Relation Age of Onset  . Hypertension Father   . CAD Father   . Cancer Brother        unknown type   Allergies  Allergen Reactions  . Accolate [Zafirlukast] Other (See Comments)    Reaction: unknown  . Bactrim [Sulfamethoxazole-Trimethoprim] Nausea And Vomiting  . Metronidazole Other (See Comments)    Reaction: unknown  . Nitrofurantoin Monohyd Macro Other (See Comments)    GI upset  . Penicillins Diarrhea and Other (See Comments)    Has patient had a PCN reaction causing immediate rash, facial/tongue/throat swelling, SOB or lightheadedness with hypotension: Unknown Has patient had a PCN reaction causing severe rash involving mucus membranes or skin necrosis: Unknown Has patient had a PCN reaction that required hospitalization: Unknown Has patient had a PCN reaction occurring within the last 10 years: Unknown If all of the above answers are "NO", then may proceed  with Cephalosporin use.  Marland Kitchen Keflex [Cephalexin] Rash   Prior to Admission medications   Medication Sig Start Date End Date Taking? Authorizing Provider  acetaminophen (TYLENOL) 325 MG tablet Take 2 tablets (650 mg total) by mouth every 6 (six) hours as needed for mild pain (or Fever >/= 101). 07/23/15  Yes Gouru, Aruna, MD  enalapril (VASOTEC) 10 MG tablet TAKE 1 TABLET BY MOUTH EVERY DAY Patient taking differently: Take 10 mg by mouth daily. 07/14/20  Yes Chrismon, Vickki Muff, PA-C  metoprolol tartrate (LOPRESSOR) 25 MG tablet TAKE 1/2 TABLET BY MOUTH TWICE A DAY Patient taking differently:  Take 12.5 mg by mouth 2 (two) times daily. 07/14/20  Yes Chrismon, Vickki Muff, PA-C  torsemide (DEMADEX) 20 MG tablet Take 20 mg by mouth every morning. 09/30/17  Yes [provider]  traZODone (DESYREL) 50 MG tablet Take 0.5 tablets (25 mg total) by mouth at bedtime as needed for sleep. 06/21/20  Yes Flinchum, Kelby Aline, FNP   DG Chest 1 View  Result Date: 07/21/2020 CLINICAL DATA:  Pain fall going fall EXAM: CHEST  1 VIEW COMPARISON:  August 09, 2013 FINDINGS: Calcified granuloma noted in left lower lung region. No edema or airspace opacity. Heart size and pulmonary vascularity are normal. No adenopathy. Bones are diffusely osteoporotic. No pneumothorax. IMPRESSION: Granuloma left lower lobe. No edema or airspace opacity. Cardiac silhouette normal. Bones osteoporotic. Electronically Signed   By: Lowella Grip III M.D.   On: 07/21/2020 15:08   CT HEAD WO CONTRAST  Result Date: 07/21/2020 CLINICAL DATA:  Head trauma.  Recent fall. EXAM: CT HEAD WITHOUT CONTRAST TECHNIQUE: Contiguous axial images were obtained from the base of the skull through the vertex without intravenous contrast. COMPARISON:  CT head 07/21/2015 FINDINGS: Brain: Generalized atrophy, with progression. Mild patchy white matter hypodensity bilaterally, with progression. Negative for hydrocephalus. Negative for acute infarct, hemorrhage, mass. Vascular: Negative for hyperdense vessel Skull: Negative Sinuses/Orbits: Extensive prior sinus surgery. Mild mucosal edema. Bilateral cataract extraction Other: None IMPRESSION: No acute abnormality. Atrophy and chronic microvascular ischemic change, with progression since 2017. Electronically Signed   By: Franchot Gallo M.D.   On: 07/21/2020 18:44   DG Hip Unilat With Pelvis 2-3 Views Left  Result Date: 07/21/2020 CLINICAL DATA:  Pain following fall EXAM: DG HIP (WITH OR WITHOUT PELVIS) 2-3V LEFT COMPARISON:  None. FINDINGS: Frontal pelvis as well as frontal and lateral left hip images  were obtained. There is diffuse osteoporosis. There is a fracture of the midportion of the left superior pubic ramus as well as a fracture of the mid left ischium with mild displacement of fracture fragments in these areas. No other appreciable fractures. No dislocations. Moderate symmetric narrowing of each hip joint noted. Postoperative change in each pubic symphysis. Degenerative change noted in the visualized lumbar spine. Foci of arterial vascular calcification noted in each thigh region. IMPRESSION: Mildly displaced fractures of the left superior pubic ramus and ischium. Proximal femurs appear intact. No dislocation. Moderate symmetric narrowing of each hip joint noted. Bones osteoporotic. Electronically Signed   By: Lowella Grip III M.D.   On: 07/21/2020 15:07    Positive ROS: All other systems have been reviewed and were otherwise negative with the exception of those mentioned in the HPI and as above.  Physical Exam: General: Alert, no acute distress Cardiovascular: No pedal edema Respiratory: No cyanosis, no use of accessory musculature GI: No organomegaly, abdomen is soft and non-tender Skin: No lesions in the area of chief complaint Neurologic:  Sensation intact distally Psychiatric: Patient is competent for consent with normal mood and affect Lymphatic: No axillary or cervical lymphadenopathy  MUSCULOSKELETAL: pain with IR/ER of left hip. Compartments soft. Good cap refill. Motor and sensory intact distally.  Assessment: Fracture of left pubic rami and ischium  Plan: She may be weight bearing as tolerated.  Will follow as an outpatient, appointment scheduled.    Lovell Sheehan, MD    07/23/2020 12:36 PM

## 2020-07-23 NOTE — Progress Notes (Signed)
PROGRESS NOTE    [name]  [id number] DOB: 1924-06-06 DOA: 07/21/2020 PCP: Margo Common, PA-C  Brief Narrative:  87 y.o. female with medical history significant of HTN, dCHF, CKD-IIIa, depression with anxiety, diverticulitis, memory loss, who presents with fall and left groin pain.  Per her granddaughter (I called her granddaughter by phone), patient fell when she was walking in the kitchen and her walker got caught on the edge of refrigerator yesterday. She landed on the left side, developed pain in the left groin area. The pain is constant, sharp, severe, nonradiating.  She is unable to bear weight on the left leg.  X-ray left pelvis showed displaced superior pubic ramus fracture and ischial fracture.  Orthopedic surgery has been consulted.  Case is discussed with Dr.Bowers who recommends nonoperative management.  Weightbearing as tolerated, therapy evaluations, need for skilled nursing facility.  Patient is endorsing "tiredness" and expresses a desire to go home.  Assessment & Plan:   Principal Problem:   Closed pelvic fracture ([place]) Active Problems:   Essential (primary) hypertension   Fall   Closed fracture of left superior pubic ramus (HCC)   Chronic diastolic CHF (congestive heart failure) (HCC)   CKD (chronic kidney disease), stage IIIa   Elevated CK  Closed pelvic fracture X-ray showed mildly displaced fractures of the left superior pubic ramus and ischium. Proximal femurs appear intact.  Patient has severe pain currently.  No neurovascular compromise.  Orthopedic surgeon, Dr. Harlow Mares was consulted.  Harlow Mares to see patient 4/12 Recommend nonoperative management Plan: Multimodal pain control Avoid IV narcotics if able Continue therapy evaluations TOC consult for skilled nursing facility placement  Fall:  Mechanical fall.  Her walker apparently got caught on something in the kitchen No evidence of syncopal event, CT head imaging negative Therapy  evaluations, recommend skilled nursing facility  Essential (primary) hypertension -IV hydralazine as needed -Continue home enalapril, metoprolol -hold torsemide due to worsening renal function -Check creatinine in a.m., restart torsemide as appropriate  Chronic diastolic CHF (congestive heart failure) (HCC)  2D echo on 08/17/2017 showed EF of 65-70% with grade 1 diastolic dysfunction.  Patient does not have leg edema.  No JVD.  No shortness of breath.  CHF seem to be compensated. Plan: -hold torsemide due to worsening renal function -Follow a.m. renal function, restart torsemide if creatinine improved  AKI on CKD (chronic kidney disease), stage IIIa: Recent baseline creatinine 1.28 on 06/21/2020.   Creatinine worsening over interval Plan: Hold IV fluids Hold torsemide  Elevated CK CK level 430 on admission, now downtrending    DVT prophylaxis: SQ Lovenox Code Status: DNR Family Communication: Granddaughter [place] [phone] on 4/11.  Daughter [name] [phone] on 4/12 Disposition Plan: Status is: Observation  The patient will require care spanning > 2 midnights and should be moved to inpatient because: Unsafe d/c plan and Inpatient level of care appropriate due to severity of illness  Dispo: The patient is from: Home              Anticipated d/c is to: SNF              Patient currently is not medically stable to d/c.   Difficult to place patient No   Pelvic fracture status post mechanical fall.  Formal orthopedic evaluation is pending but recommending nonoperative management.  Pain control, therapy evaluations.  Patient will need skilled nursing facility.  Bed search initiated.    Level of care: Med-Surg  Consultants:   Orthopedic surgery   Procedures:  None  Antimicrobials:   None   Subjective: Patient seen and examined.  Reports that she is tired this morning.  Pain only present when moving.  Endorses radiation into the groin.  Objective: Vitals:    07/22/20 2005 07/22/20 2345 07/23/20 0459 07/23/20 0810  BP: (!) 156/62 (!) 135/53 (!) 144/66 (!) 157/66  Pulse: 75 68 (!) 58 65  Resp: '16 16 16 16  '$ Temp: 98.4 F (36.9 C) 98.2 F (36.8 C) 97.8 F (36.6 C) 98 F (36.7 C)  TempSrc:  Oral Oral   SpO2: 97% 97% 96% 97%  Weight:      Height:        Intake/Output Summary (Last 24 hours) at 07/23/2020 1201 Last data filed at 07/23/2020 1028 Gross per 24 hour  Intake 900 ml  Output 750 ml  Net 150 ml   Filed Weights   07/21/20 1339  Weight: 55 kg    Examination:  General exam: Appears calm and comfortable  Respiratory system: Clear to auscultation. Respiratory effort normal. Cardiovascular system: S1 & S2 heard, RRR. No JVD, murmurs, rubs, gallops or clicks. No pedal edema. Gastrointestinal system: Abdomen is nondistended, soft and nontender. No organomegaly or masses felt. Normal bowel sounds heard. Central nervous system: Alert, oriented x2. No focal neurological deficits. Extremities: Limited range of motion bilateral lower extremities.  Pain on palpation of left hip Skin: No rashes, lesions or ulcers Psychiatry: Judgement and insight appear normal. Mood & affect appropriate.     Data Reviewed: I have personally reviewed following labs and imaging studies  CBC: Recent Labs  Lab 07/21/20 1447 07/22/20 0559  WBC 9.3 5.1  NEUTROABS 7.3  --   HGB 10.2* 8.5*  HCT 30.9* 25.3*  MCV 94.8 94.4  PLT 134* [id number]*   Basic Metabolic Panel: Recent Labs  Lab 07/21/20 1447 07/22/20 0559  NA 137 137  K 4.0 4.0  CL 106 108  CO2 25 23  GLUCOSE 99 89  BUN 37* 33*  CREATININE 1.47* 1.51*  CALCIUM 9.6 8.7*   GFR: Estimated Creatinine Clearance: 20.1 mL/min (A) (by C-G formula based on SCr of 1.51 mg/dL (H)). Liver Function Tests: Recent Labs  Lab 07/21/20 1447  AST 33  ALT 15  ALKPHOS 71  BILITOT 1.0  PROT 7.3  ALBUMIN 4.2   No results for input(s): LIPASE, AMYLASE in the last 168 hours. No results for input(s):  AMMONIA in the last 168 hours. Coagulation Profile: Recent Labs  Lab 07/21/20 1447  INR 1.1   Cardiac Enzymes: Recent Labs  Lab 07/21/20 1447 07/22/20 0559  CKTOTAL 430* 322*   BNP (last 3 results) No results for input(s): PROBNP in the last 8760 hours. HbA1C: No results for input(s): HGBA1C in the last 72 hours. CBG: No results for input(s): GLUCAP in the last 168 hours. Lipid Profile: No results for input(s): CHOL, HDL, LDLCALC, TRIG, CHOLHDL, LDLDIRECT in the last 72 hours. Thyroid Function Tests: No results for input(s): TSH, T4TOTAL, FREET4, T3FREE, THYROIDAB in the last 72 hours. Anemia Panel: No results for input(s): VITAMINB12, FOLATE, FERRITIN, TIBC, IRON, RETICCTPCT in the last 72 hours. Sepsis Labs: No results for input(s): PROCALCITON, LATICACIDVEN in the last 168 hours.  Recent Results (from the past 240 hour(s))  Resp Panel by RT-PCR (Flu A&B, Covid) Nasopharyngeal Swab     Status: None   Collection Time: 07/21/20  3:18 PM   Specimen: Nasopharyngeal Swab; Nasopharyngeal(NP) swabs in vial transport medium  Result Value Ref Range Status   SARS Coronavirus  2 by RT PCR NEGATIVE NEGATIVE Final    Comment: (NOTE) SARS-CoV-2 target nucleic acids are NOT DETECTED.  The SARS-CoV-2 RNA is generally detectable in upper respiratory specimens during the acute phase of infection. The lowest concentration of SARS-CoV-2 viral copies this assay can detect is 138 copies/mL. A negative result does not preclude SARS-Cov-2 infection and should not be used as the sole basis for treatment or other patient management decisions. A negative result may occur with  improper specimen collection/handling, submission of specimen other than nasopharyngeal swab, presence of viral mutation(s) within the areas targeted by this assay, and inadequate number of viral copies(<138 copies/mL). A negative result must be combined with clinical observations, patient history, and  epidemiological information. The expected result is Negative.  Fact Sheet for Patients:  EntrepreneurPulse.com.au  Fact Sheet for Healthcare Providers:  IncredibleEmployment.be  This test is no t yet approved or cleared by the [place] FDA and  has been authorized for detection and/or diagnosis of SARS-CoV-2 by FDA under an Emergency Use Authorization (EUA). This EUA will remain  in effect (meaning this test can be used) for the duration of the COVID-19 declaration under Section 564(b)(1) of the Act, 21 U.S.C.section 360bbb-3(b)(1), unless the authorization is terminated  or revoked sooner.       Influenza A by PCR NEGATIVE NEGATIVE Final   Influenza B by PCR NEGATIVE NEGATIVE Final    Comment: (NOTE) The Xpert Xpress SARS-CoV-2/FLU/RSV plus assay is intended as an aid in the diagnosis of influenza from Nasopharyngeal swab specimens and should not be used as a sole basis for treatment. Nasal washings and aspirates are unacceptable for Xpert Xpress SARS-CoV-2/FLU/RSV testing.  Fact Sheet for Patients: EntrepreneurPulse.com.au  Fact Sheet for Healthcare Providers: IncredibleEmployment.be  This test is not yet approved or cleared by the [place] FDA and has been authorized for detection and/or diagnosis of SARS-CoV-2 by FDA under an Emergency Use Authorization (EUA). This EUA will remain in effect (meaning this test can be used) for the duration of the COVID-19 declaration under Section 564(b)(1) of the Act, 21 U.S.C. section 360bbb-3(b)(1), unless the authorization is terminated or revoked.  Performed at [facility], [address] [place], [place] [address]          Radiology Studies: DG Chest 1 View  Result Date: 07/21/2020 CLINICAL DATA:  Pain fall going fall EXAM: CHEST  1 VIEW COMPARISON:  August 09, 2013 FINDINGS: Calcified granuloma noted in left lower lung region. No  edema or airspace opacity. Heart size and pulmonary vascularity are normal. No adenopathy. Bones are diffusely osteoporotic. No pneumothorax. IMPRESSION: Granuloma left lower lobe. No edema or airspace opacity. Cardiac silhouette normal. Bones osteoporotic. Electronically Signed   By: Lowella Grip III M.D.   On: 07/21/2020 15:08   CT HEAD WO CONTRAST  Result Date: 07/21/2020 CLINICAL DATA:  Head trauma.  Recent fall. EXAM: CT HEAD WITHOUT CONTRAST TECHNIQUE: Contiguous axial images were obtained from the base of the skull through the vertex without intravenous contrast. COMPARISON:  CT head 07/21/2015 FINDINGS: Brain: Generalized atrophy, with progression. Mild patchy white matter hypodensity bilaterally, with progression. Negative for hydrocephalus. Negative for acute infarct, hemorrhage, mass. Vascular: Negative for hyperdense vessel Skull: Negative Sinuses/Orbits: Extensive prior sinus surgery. Mild mucosal edema. Bilateral cataract extraction Other: None IMPRESSION: No acute abnormality. Atrophy and chronic microvascular ischemic change, with progression since 2017. Electronically Signed   By: Franchot Gallo M.D.   On: 07/21/2020 18:44   DG Hip Unilat With Pelvis 2-3  Views Left  Result Date: 07/21/2020 CLINICAL DATA:  Pain following fall EXAM: DG HIP (WITH OR WITHOUT PELVIS) 2-3V LEFT COMPARISON:  None. FINDINGS: Frontal pelvis as well as frontal and lateral left hip images were obtained. There is diffuse osteoporosis. There is a fracture of the midportion of the left superior pubic ramus as well as a fracture of the mid left ischium with mild displacement of fracture fragments in these areas. No other appreciable fractures. No dislocations. Moderate symmetric narrowing of each hip joint noted. Postoperative change in each pubic symphysis. Degenerative change noted in the visualized lumbar spine. Foci of arterial vascular calcification noted in each thigh region. IMPRESSION: Mildly displaced  fractures of the left superior pubic ramus and ischium. Proximal femurs appear intact. No dislocation. Moderate symmetric narrowing of each hip joint noted. Bones osteoporotic. Electronically Signed   By: Lowella Grip III M.D.   On: 07/21/2020 15:07        Scheduled Meds: . enalapril  10 mg Oral Daily  . enoxaparin (LOVENOX) injection  30 mg Subcutaneous Q24H  . feeding supplement  237 mL Oral BID BM  . gabapentin  300 mg Oral BID  . metoprolol tartrate  12.5 mg Oral BID  . multivitamin with minerals  1 tablet Oral Daily   Continuous Infusions:   LOS: 0 days    Time spent: 25 minutes    Sidney Ace, MD [facility] Pager [phone]  If 7PM-7AM, please contact night-coverage 07/23/2020, 12:01 PM

## 2020-07-23 NOTE — TOC Progression Note (Signed)
Transition of Care [facility]) - Progression Note    Patient Details  Name: [name] MRN: [id number] Date of Birth: 04-07-1925  Transition of Care [facility]) CM/SW Contact  Su Hilt, RN Phone Number: 07/23/2020, 12:27 PM  Clinical Narrative:    Spoke With the patient and her grand daughter in the room, Obtained HCPOA and Living will papers, Made a copy and placed on the chart and gave the original back to the grand daughter, Reviewed bed offers and they chose [facility], I called [facility] and started ins auth approval process, Notified Magda Paganini at [facility] of the acceptance, awaiting auth approval        Expected Discharge Plan and Services                                                 Social Determinants of Health (SDOH) Interventions    Readmission Risk Interventions No flowsheet data found.

## 2020-07-24 DIAGNOSIS — Z20822 Contact with and (suspected) exposure to covid-19: Secondary | ICD-10-CM | POA: Diagnosis present

## 2020-07-24 DIAGNOSIS — M19049 Primary osteoarthritis, unspecified hand: Secondary | ICD-10-CM | POA: Diagnosis not present

## 2020-07-24 DIAGNOSIS — S32502D Unspecified fracture of left pubis, subsequent encounter for fracture with routine healing: Secondary | ICD-10-CM

## 2020-07-24 DIAGNOSIS — J309 Allergic rhinitis, unspecified: Secondary | ICD-10-CM | POA: Diagnosis not present

## 2020-07-24 DIAGNOSIS — G4700 Insomnia, unspecified: Secondary | ICD-10-CM | POA: Diagnosis not present

## 2020-07-24 DIAGNOSIS — I130 Hypertensive heart and chronic kidney disease with heart failure and stage 1 through stage 4 chronic kidney disease, or unspecified chronic kidney disease: Secondary | ICD-10-CM | POA: Diagnosis present

## 2020-07-24 DIAGNOSIS — I10 Essential (primary) hypertension: Secondary | ICD-10-CM | POA: Diagnosis not present

## 2020-07-24 DIAGNOSIS — N1831 Chronic kidney disease, stage 3a: Secondary | ICD-10-CM | POA: Diagnosis present

## 2020-07-24 DIAGNOSIS — R413 Other amnesia: Secondary | ICD-10-CM | POA: Diagnosis not present

## 2020-07-24 DIAGNOSIS — M8000XD Age-related osteoporosis with current pathological fracture, unspecified site, subsequent encounter for fracture with routine healing: Secondary | ICD-10-CM | POA: Diagnosis not present

## 2020-07-24 DIAGNOSIS — Z8249 Family history of ischemic heart disease and other diseases of the circulatory system: Secondary | ICD-10-CM | POA: Diagnosis not present

## 2020-07-24 DIAGNOSIS — S32602D Unspecified fracture of left ischium, subsequent encounter for fracture with routine healing: Secondary | ICD-10-CM | POA: Diagnosis not present

## 2020-07-24 DIAGNOSIS — F418 Other specified anxiety disorders: Secondary | ICD-10-CM | POA: Diagnosis not present

## 2020-07-24 DIAGNOSIS — J9611 Chronic respiratory failure with hypoxia: Secondary | ICD-10-CM | POA: Diagnosis not present

## 2020-07-24 DIAGNOSIS — E559 Vitamin D deficiency, unspecified: Secondary | ICD-10-CM | POA: Diagnosis not present

## 2020-07-24 DIAGNOSIS — Z888 Allergy status to other drugs, medicaments and biological substances status: Secondary | ICD-10-CM | POA: Diagnosis not present

## 2020-07-24 DIAGNOSIS — S32592A Other specified fracture of left pubis, initial encounter for closed fracture: Secondary | ICD-10-CM | POA: Diagnosis present

## 2020-07-24 DIAGNOSIS — M62838 Other muscle spasm: Secondary | ICD-10-CM | POA: Diagnosis not present

## 2020-07-24 DIAGNOSIS — W19XXXD Unspecified fall, subsequent encounter: Secondary | ICD-10-CM | POA: Diagnosis not present

## 2020-07-24 DIAGNOSIS — W1830XA Fall on same level, unspecified, initial encounter: Secondary | ICD-10-CM | POA: Diagnosis present

## 2020-07-24 DIAGNOSIS — K5790 Diverticulosis of intestine, part unspecified, without perforation or abscess without bleeding: Secondary | ICD-10-CM | POA: Diagnosis not present

## 2020-07-24 DIAGNOSIS — S329XXA Fracture of unspecified parts of lumbosacral spine and pelvis, initial encounter for closed fracture: Secondary | ICD-10-CM | POA: Diagnosis present

## 2020-07-24 DIAGNOSIS — Z880 Allergy status to penicillin: Secondary | ICD-10-CM | POA: Diagnosis not present

## 2020-07-24 DIAGNOSIS — Y92009 Unspecified place in unspecified non-institutional (private) residence as the place of occurrence of the external cause: Secondary | ICD-10-CM | POA: Diagnosis not present

## 2020-07-24 DIAGNOSIS — Z66 Do not resuscitate: Secondary | ICD-10-CM | POA: Diagnosis present

## 2020-07-24 DIAGNOSIS — Z8659 Personal history of other mental and behavioral disorders: Secondary | ICD-10-CM | POA: Diagnosis not present

## 2020-07-24 DIAGNOSIS — I5032 Chronic diastolic (congestive) heart failure: Secondary | ICD-10-CM | POA: Diagnosis present

## 2020-07-24 DIAGNOSIS — Z79899 Other long term (current) drug therapy: Secondary | ICD-10-CM | POA: Diagnosis not present

## 2020-07-24 LAB — BASIC METABOLIC PANEL
Anion gap: 7 (ref 5–15)
BUN: 50 mg/dL — ABNORMAL HIGH (ref 8–23)
CO2: 23 mmol/L (ref 22–32)
Calcium: 9.4 mg/dL (ref 8.9–10.3)
Chloride: 104 mmol/L (ref 98–111)
Creatinine, Ser: 1.45 mg/dL — ABNORMAL HIGH (ref 0.44–1.00)
GFR, Estimated: 34 mL/min — ABNORMAL LOW (ref >60)
Glucose, Bld: 106 mg/dL — ABNORMAL HIGH (ref 70–99)
Potassium: 4.7 mmol/L (ref 3.5–5.1)
Sodium: 134 mmol/L — ABNORMAL LOW (ref 135–145)

## 2020-07-24 LAB — CBC WITH DIFFERENTIAL/PLATELET
Abs Immature Granulocytes: 0.03 10*3/uL (ref 0.00–0.07)
Basophils Absolute: 0 10*3/uL (ref 0.0–0.1)
Basophils Relative: 0 %
Eosinophils Absolute: 0.1 10*3/uL (ref 0.0–0.5)
Eosinophils Relative: 2 %
HCT: 25.7 % — ABNORMAL LOW (ref 36.0–46.0)
Hemoglobin: 8.5 g/dL — ABNORMAL LOW (ref 12.0–15.0)
Immature Granulocytes: 1 %
Lymphocytes Relative: 29 %
Lymphs Abs: 1.7 10*3/uL (ref 0.7–4.0)
MCH: 32 pg (ref 26.0–34.0)
MCHC: 33.1 g/dL (ref 30.0–36.0)
MCV: 96.6 fL (ref 80.0–100.0)
Monocytes Absolute: 0.6 10*3/uL (ref 0.1–1.0)
Monocytes Relative: 11 %
Neutro Abs: 3.2 10*3/uL (ref 1.7–7.7)
Neutrophils Relative %: 57 %
Platelets: 136 10*3/uL — ABNORMAL LOW (ref 150–400)
RBC: 2.66 MIL/uL — ABNORMAL LOW (ref 3.87–5.11)
RDW: 13.7 % (ref 11.5–15.5)
WBC: 5.7 10*3/uL (ref 4.0–10.5)
nRBC: 0 % (ref 0.0–0.2)

## 2020-07-24 LAB — SARS CORONAVIRUS 2 (TAT 6-24 HRS): SARS Coronavirus 2: NEGATIVE

## 2020-07-24 MED ORDER — METHOCARBAMOL 500 MG PO TABS: 500.0000 mg | ORAL_TABLET | Freq: Three times a day (TID) | ORAL | 0 refills | Status: AC | PRN

## 2020-07-24 MED ORDER — COVID-19 MRNA VAC-TRIS(PFIZER) 30 MCG/0.3ML IM SUSP
0.3000 mL | Freq: Once | INTRAMUSCULAR | Status: AC
  Administered 2020-07-24: 0.3 mL via INTRAMUSCULAR
  Filled 2020-07-24: qty 0.3

## 2020-07-24 NOTE — TOC Progression Note (Signed)
Transition of Care [facility]) - Progression Note    Patient Details  Name: [name] MRN: [id number] Date of Birth: 05/30/1924  Transition of Care [facility]) CM/SW [place], RN Phone Number: 07/24/2020, 12:38 PM  Clinical Narrative:   Called the patients grand daughter to notify of approval and that she would be going to room 404 at [facility], the mail box is full and cant leave a message and there was no answer         Expected Discharge Plan and Services           Expected Discharge Date: 07/24/20                                     Social Determinants of Health (SDOH) Interventions    Readmission Risk Interventions No flowsheet data found.

## 2020-07-24 NOTE — Progress Notes (Signed)
Physical Therapy Treatment Patient Details Name: [name] MRN: [id number] DOB: [birth year] Today's Date: 07/24/2020    History of Present Illness [name] is a 87 y.o. female with medical history significant of HTN, dCHF, CKD-IIIa, depression with anxiety, diverticulitis, memory loss, who presents with fall and left groin pain.     Per her granddaughter (I called her granddaughter by phone), patient fell when she was walking in the kitchen and her walker got caught on the edge of refrigerator yesterday. She landed on the left side, developed pain in the left groin area. The pain is constant, sharp, severe, nonradiating.  She is unable to bear weight on the left leg.  Patient found to have L pubic ramus and ischium fx. WBAT.    PT Comments    Pt was finishing up OT upon arriving. Agrees to PT session. Pt does endorse pain that limits session progression. She stood to RW and ambulate 10 ft with min assist. Pt is limited by pain but overall tolerated session well. Highly recommend DC to SNF to address deficits prior to returning home. Pt was in recliner with RN tech in room at conclusion of session.    Follow Up Recommendations  SNF     Equipment Recommendations  None recommended by PT       Precautions / Restrictions Precautions Precautions: Fall Restrictions Weight Bearing Restrictions: No    Mobility  Bed Mobility  General bed mobility comments: Pt was sitting EOB with OT upon arriving    Transfers Overall transfer level: Needs assistance Equipment used: Rolling walker (2 wheeled) Transfers: Sit to/from Stand Sit to Stand: Min assist         General transfer comment: min assist to stand from slightly elevated bed height  Ambulation/Gait Ambulation/Gait assistance: Min assist Gait Distance (Feet): 10 Feet Assistive device: Rolling walker (2 wheeled) Gait Pattern/deviations: Antalgic Gait velocity: decreased   General Gait Details: Pt was able to ambulate ~ 10  ft with slow step to gait kinematics. Needs constant encouragement. limited by pain      Balance Overall balance assessment: Needs assistance Sitting-balance support: Feet supported Sitting balance-Leahy Scale: Good     Standing balance support: Bilateral upper extremity supported Standing balance-Leahy Scale: Fair Standing balance comment: heavy UE support required       Cognition Arousal/Alertness: Awake/alert Behavior During Therapy: WFL for tasks assessed/performed Overall Cognitive Status: No family/caregiver present to determine baseline cognitive functioning    General Comments: Pt is A but does have some cognition deficits come to light during sesison             Pertinent Vitals/Pain Pain Assessment: 0-10 Pain Score: 9  Faces Pain Scale: Hurts whole lot Pain Location: left groin Pain Descriptors / Indicators: Discomfort Pain Intervention(s): Limited activity within patient's tolerance;Monitored during session;Premedicated before session;Repositioned           PT Goals (current goals can now be found in the care plan section) Acute Rehab PT Goals Patient Stated Goal: more independence Progress towards PT goals: Progressing toward goals    Frequency    7X/week      PT Plan Current plan remains appropriate       AM-PAC PT "6 Clicks" Mobility   Outcome Measure  Help needed turning from your back to your side while in a flat bed without using bedrails?: A Little Help needed moving from lying on your back to sitting on the side of a flat bed without using bedrails?: A Little  Help needed moving to and from a bed to a chair (including a wheelchair)?: A Little Help needed standing up from a chair using your arms (e.g., wheelchair or bedside chair)?: A Lot Help needed to walk in hospital room?: A Lot Help needed climbing 3-5 steps with a railing? : A Lot 6 Click Score: 15    End of Session Equipment Utilized During Treatment: Gait belt Activity  Tolerance: Patient limited by pain Patient left: in chair;with call bell/phone within reach;with chair alarm set Nurse Communication: Mobility status PT Visit Diagnosis: History of falling (Z91.81);Pain;Difficulty in walking, not elsewhere classified (R26.2);Other abnormalities of gait and mobility (R26.89) Pain - Right/Left: Left     Time: [id number] PT Time Calculation (min) (ACUTE ONLY): 11 min  Charges:  $Therapeutic Activity: 8-22 mins                     Julaine Fusi PTA 07/24/20, 10:09 AM

## 2020-07-24 NOTE — TOC Progression Note (Signed)
Transition of Care [facility]) - Progression Note    Patient Details  Name: [name] MRN: [id number] Date of Birth: 10/31/1924  Transition of Care [facility]) CM/SW Contact  Su Hilt, RN Phone Number: 07/24/2020, 9:03 AM  Clinical Narrative:    HTA/THN  Marlowe Kays called with insurance approval for 7 days Auth number [id number], I notified Magda Paganini at [facility] and called the [place] daughter and notified her and the patient        Expected Discharge Plan and Services                                                 Social Determinants of Health ([place]) Interventions    Readmission Risk Interventions No flowsheet data found.

## 2020-07-24 NOTE — Progress Notes (Signed)
Occupational Therapy Treatment Patient Details Name: [name] MRN: [id number] DOB: 1924/08/15 Today's Date: 07/24/2020    History of present illness [name] is a 87 y.o. female with medical history significant of HTN, dCHF, CKD-IIIa, depression with anxiety, diverticulitis, memory loss, who presents with fall and left groin pain.     Per her granddaughter (I called her granddaughter by phone), patient fell when she was walking in the kitchen and her walker got caught on the edge of refrigerator yesterday. She landed on the left side, developed pain in the left groin area. The pain is constant, sharp, severe, nonradiating.  She is unable to bear weight on the left leg.  Patient found to have L pubic ramus and ischium fx. WBAT.   OT comments  Pt seen for OT treatment this date to f/u re: safety with ADLs/ADL mobility. Pt presents with some L pelvic region pain superimposed on some confusion, impacting her ability to safely and efficiently perform self care and fxl mobility at this time. Pt requires MIN/MOD A to come to EOB sitting with HOB slightly elevated, use of bed rails and OT cueing pt to sequence rolling to her side. Pt demos F static sitting balance; she initially requires MIN A, but with assist to square her hips, she is able to maintain static sit with SUPV only. Pt requires SETUP to MIN A for seated UB ADLs including oral care and pt requires MOD/MAX A for LB dressing in sitting (to don socks) d/t decreased tolerate hip ROM 2/2 pain. Pt left sitting EOB after completing ADL tasks with OT as PT presents to initiate treatment. Will continue to follow. Continue to anticipate that pt will require STR upon d/c from acute setting.     Follow Up Recommendations  SNF    Equipment Recommendations  3 in 1 bedside commode    Recommendations for Other Services      Precautions / Restrictions Precautions Precautions: Fall Restrictions Weight Bearing Restrictions: No Other  Position/Activity Restrictions: WBAT, per secure chat w/ MD       Mobility Bed Mobility Overal bed mobility: Needs Assistance Bed Mobility: Supine to Sit;Sit to Supine     Supine to sit: Min assist;Mod assist;HOB elevated     General bed mobility comments: increased time, use of bed rails, cues to sequence.    Transfers                 General transfer comment: deferred, PT presents for treatment once pt completes ADL tasks with OT in EOB sitting.    Balance Overall balance assessment: Needs assistance Sitting-balance support: Feet supported Sitting balance-Leahy Scale: Fair Sitting balance - Comments: initially requiring MIN A for sitting balance, but progresses to maintaining static sit with SUPV only after some assist for squaring her hips       Standing balance comment: deferred                           ADL either performed or assessed with clinical judgement   ADL Overall ADL's : Needs assistance/impaired     Grooming: Wash/dry face;Oral care;Wash/dry hands;Supervision/safety;Set up;Minimal assistance;Sitting;Cueing for sequencing Grooming Details (indicate cue type and reason): initially MIN A for sitting balance, progresses to SUPV with G static sitting balance and able to alternate hands from providing istting support to contribute to self care. Some gentle re-direction, pt perseverates on feet being cold.  Lower Body Dressing: Moderate assistance;Sitting/lateral leans Lower Body Dressing Details (indicate cue type and reason): Pt able to don R sock in sitting with MIN A (OT threads over toes), but requires MOD/MAX A for L foot d/t increased pain/decreased ROM                     Vision Patient Visual Report: No change from baseline     Perception     Praxis      Cognition Arousal/Alertness: Awake/alert Behavior During Therapy: WFL for tasks assessed/performed Overall Cognitive Status: No family/caregiver present  to determine baseline cognitive functioning                                 General Comments: Pt is alert and conversational throughout, but she is noted to repeat some of the same phrases several times such as "my feet are cold, I know that much"        Exercises Other Exercises Other Exercises: OT facilitates pt participation in seated grooming tasks.   Shoulder Instructions       General Comments      Pertinent Vitals/ Pain       Pain Assessment: Faces Faces Pain Scale: Hurts even more Pain Location: left groin area Pain Descriptors / Indicators: Discomfort Pain Intervention(s): Limited activity within patient's tolerance;Monitored during session;Premedicated before session;Repositioned  Home Living                                          Prior Functioning/Environment              Frequency  Min 2X/week        Progress Toward Goals  OT Goals(current goals can now be found in the care plan section)  Progress towards OT goals: Progressing toward goals  Acute Rehab OT Goals Patient Stated Goal: more independence OT Goal Formulation: With patient Time For Goal Achievement: 08/05/20 Potential to Achieve Goals: Good  Plan Discharge plan remains appropriate    Co-evaluation                 AM-PAC OT "6 Clicks" Daily Activity     Outcome Measure   Help from another person eating meals?: None Help from another person taking care of personal grooming?: A Little Help from another person toileting, which includes using toliet, bedpan, or urinal?: A Lot Help from another person bathing (including washing, rinsing, drying)?: A Lot Help from another person to put on and taking off regular upper body clothing?: A Little Help from another person to put on and taking off regular lower body clothing?: A Lot 6 Click Score: 16    End of Session    OT Visit Diagnosis: Other abnormalities of gait and mobility (R26.89);Muscle  weakness (generalized) (M62.81);History of falling (Z91.81);Pain Pain - Right/Left: Left Pain - part of body: Hip (hip/groin area/pelvic region)   Activity Tolerance Patient tolerated treatment well   Patient Left Other (comment) (seated EOB With PT presenting for treatment.)   Nurse Communication Mobility status        Time: [id number] OT Time Calculation (min): 23 min  Charges: OT General Charges $OT Visit: 1 Visit OT Treatments $Self Care/Home Management : 23-37 mins  Gerrianne Scale, [place], OTR/L ascom [phone] 07/24/20, 2:10 PM

## 2020-07-24 NOTE — TOC Progression Note (Signed)
Transition of Care [facility]) - Progression Note    Patient Details  Name: [name] MRN: [id number] Date of Birth: March 30, 1925  Transition of Care [facility]) CM/SW [place], RN Phone Number: 07/24/2020, 1:26 PM  Clinical Narrative:   Rejeana Brock transport to transport the patient to room 404 at [facility], The nurse to call report, the number provided, The patient can go in a wheelchair, Transport to call Juliane Lack at [phone], Spoke to [place] to arrange wheelchair transport. She is a DNR [place] waiver faxed to [phone]         Expected Discharge Plan and Services           Expected Discharge Date: 07/24/20                                     Social Determinants of Health (SDOH) Interventions    Readmission Risk Interventions No flowsheet data found.

## 2020-07-24 NOTE — Discharge Summary (Signed)
Physician Discharge Summary  [name] [id number] DOB: 02-23-1925 DOA: 07/21/2020  PCP: Margo Common, PA-C  Admit date: 07/21/2020 Discharge date: 07/24/2020  Discharge disposition: Skilled nursing facility   Recommendations for Outpatient Follow-Up:   Follow-up physician at the skilled nursing facility within 3 days of discharge.  Follow-up with Dr. Harlow Mares, orthopedic surgeon, in 1 week  Discharge Diagnosis:   Principal Problem:   Closed pelvic fracture [facility]) Active Problems:   Essential (primary) hypertension   Fall   Closed fracture of left superior pubic ramus (HCC)   Chronic diastolic CHF (congestive heart failure) (HCC)   CKD (chronic kidney disease), stage IIIa   Elevated CK    Discharge Condition: Stable.  Diet recommendation:  Diet Order            Diet - low sodium heart healthy           Diet regular Room service appropriate? Yes with Assist; Fluid consistency: Thin  Diet effective now                   Code Status: DNR     Hospital Course:   [name] is a 87 year old woman with medical history significant for hypertension, chronic diastolic CHF, CKD stage III, depression, anxiety, diverticulitis, memory loss, who presented to the hospital because of left groin pain after a mechanical fall. Work-up revealed mildly displaced fractures of the left superior pubic ramus and ischium (pelvic fracture).  She was treated with analgesics.  Orthopedic surgeon was consulted and he recommended conservative management and outpatient follow-up.  She was evaluated by PT and OT who recommended further rehabilitation at the skilled nursing facility.  Patient requested Covid vaccine booster shot.  I spoke to her granddaughter, [name], to verify previous Covid vaccine shots.  [name] said patient received first [place] Covid vaccine on May 14, 2020 and second Covid vaccine on June 04, 2020.  Patient will be given Pfizer Covid booster vaccine prior  to discharge.  She is agreeable with the discharge plan.     Medical Consultants:    Orthopedic surgeon, Dr. Harlow Mares   Discharge Exam:    Vitals:   07/23/20 1636 07/23/20 1953 07/24/20 0545 07/24/20 0847  BP: (!) 132/51 (!) 131/52 (!) 120/53 131/67  Pulse: 66 66 66 67  Resp: '18 16 16 16  '$ Temp: 98 F (36.7 C) 98.5 F (36.9 C) 97.6 F (36.4 C) 97.9 F (36.6 C)  TempSrc:  Oral Oral   SpO2: 96% 96% 96% 99%  Weight:      Height:         GEN: NAD SKIN: No rash EYES: EOMI ENT: MMM CV: RRR PULM: CTA B ABD: soft, ND, NT, +BS CNS: AAO x 3, non focal EXT: No edema or tenderness MSK: Left groin tenderness   The results of significant diagnostics from this hospitalization (including imaging, microbiology, ancillary and laboratory) are listed below for reference.     Procedures and Diagnostic Studies:   DG Chest 1 View  Result Date: 07/21/2020 CLINICAL DATA:  Pain fall going fall EXAM: CHEST  1 VIEW COMPARISON:  August 09, 2013 FINDINGS: Calcified granuloma noted in left lower lung region. No edema or airspace opacity. Heart size and pulmonary vascularity are normal. No adenopathy. Bones are diffusely osteoporotic. No pneumothorax. IMPRESSION: Granuloma left lower lobe. No edema or airspace opacity. Cardiac silhouette normal. Bones osteoporotic. Electronically Signed   By: Lowella Grip III M.D.   On: 07/21/2020 15:08   CT  HEAD WO CONTRAST  Result Date: 07/21/2020 CLINICAL DATA:  Head trauma.  Recent fall. EXAM: CT HEAD WITHOUT CONTRAST TECHNIQUE: Contiguous axial images were obtained from the base of the skull through the vertex without intravenous contrast. COMPARISON:  CT head 07/21/2015 FINDINGS: Brain: Generalized atrophy, with progression. Mild patchy white matter hypodensity bilaterally, with progression. Negative for hydrocephalus. Negative for acute infarct, hemorrhage, mass. Vascular: Negative for hyperdense vessel Skull: Negative Sinuses/Orbits: Extensive prior  sinus surgery. Mild mucosal edema. Bilateral cataract extraction Other: None IMPRESSION: No acute abnormality. Atrophy and chronic microvascular ischemic change, with progression since 2017. Electronically Signed   By: Franchot Gallo M.D.   On: 07/21/2020 18:44   DG Hip Unilat With Pelvis 2-3 Views Left  Result Date: 07/21/2020 CLINICAL DATA:  Pain following fall EXAM: DG HIP (WITH OR WITHOUT PELVIS) 2-3V LEFT COMPARISON:  None. FINDINGS: Frontal pelvis as well as frontal and lateral left hip images were obtained. There is diffuse osteoporosis. There is a fracture of the midportion of the left superior pubic ramus as well as a fracture of the mid left ischium with mild displacement of fracture fragments in these areas. No other appreciable fractures. No dislocations. Moderate symmetric narrowing of each hip joint noted. Postoperative change in each pubic symphysis. Degenerative change noted in the visualized lumbar spine. Foci of arterial vascular calcification noted in each thigh region. IMPRESSION: Mildly displaced fractures of the left superior pubic ramus and ischium. Proximal femurs appear intact. No dislocation. Moderate symmetric narrowing of each hip joint noted. Bones osteoporotic. Electronically Signed   By: Lowella Grip III M.D.   On: 07/21/2020 15:07     Labs:   Basic Metabolic Panel: Recent Labs  Lab 07/21/20 1447 07/22/20 0559 07/24/20 0520  NA 137 137 134*  K 4.0 4.0 4.7  CL 106 108 104  CO2 '25 23 23  '$ GLUCOSE 99 89 106*  BUN 37* 33* 50*  CREATININE 1.47* 1.51* 1.45*  CALCIUM 9.6 8.7* 9.4   GFR Estimated Creatinine Clearance: 20.9 mL/min (A) (by C-G formula based on SCr of 1.45 mg/dL (H)). Liver Function Tests: Recent Labs  Lab 07/21/20 1447  AST 33  ALT 15  ALKPHOS 71  BILITOT 1.0  PROT 7.3  ALBUMIN 4.2   No results for input(s): LIPASE, AMYLASE in the last 168 hours. No results for input(s): AMMONIA in the last 168 hours. Coagulation profile Recent Labs   Lab 07/21/20 1447  INR 1.1    CBC: Recent Labs  Lab 07/21/20 1447 07/22/20 0559 07/24/20 0520  WBC 9.3 5.1 5.7  NEUTROABS 7.3  --  3.2  HGB 10.2* 8.5* 8.5*  HCT 30.9* 25.3* 25.7*  MCV 94.8 94.4 96.6  PLT 134* 116* 136*   Cardiac Enzymes: Recent Labs  Lab 07/21/20 1447 07/22/20 0559  CKTOTAL 430* 322*   BNP: Invalid input(s): POCBNP CBG: No results for input(s): GLUCAP in the last 168 hours. D-Dimer No results for input(s): DDIMER in the last 72 hours. Hgb A1c No results for input(s): HGBA1C in the last 72 hours. Lipid Profile No results for input(s): CHOL, HDL, LDLCALC, TRIG, CHOLHDL, LDLDIRECT in the last 72 hours. Thyroid function studies No results for input(s): TSH, T4TOTAL, T3FREE, THYROIDAB in the last 72 hours.  Invalid input(s): FREET3 Anemia work up No results for input(s): VITAMINB12, FOLATE, FERRITIN, TIBC, IRON, RETICCTPCT in the last 72 hours. Microbiology Recent Results (from the past 240 hour(s))  Resp Panel by RT-PCR (Flu A&B, Covid) Nasopharyngeal Swab     Status: None  Collection Time: 07/21/20  3:18 PM   Specimen: Nasopharyngeal Swab; Nasopharyngeal(NP) swabs in vial transport medium  Result Value Ref Range Status   SARS Coronavirus 2 by RT PCR NEGATIVE NEGATIVE Final    Comment: (NOTE) SARS-CoV-2 target nucleic acids are NOT DETECTED.  The SARS-CoV-2 RNA is generally detectable in upper respiratory specimens during the acute phase of infection. The lowest concentration of SARS-CoV-2 viral copies this assay can detect is 138 copies/mL. A negative result does not preclude SARS-Cov-2 infection and should not be used as the sole basis for treatment or other patient management decisions. A negative result may occur with  improper specimen collection/handling, submission of specimen other than nasopharyngeal swab, presence of viral mutation(s) within the areas targeted by this assay, and inadequate number of viral copies(<138 copies/mL). A  negative result must be combined with clinical observations, patient history, and epidemiological information. The expected result is Negative.  Fact Sheet for Patients:  EntrepreneurPulse.com.au  Fact Sheet for Healthcare Providers:  IncredibleEmployment.be  This test is no t yet approved or cleared by the [place] FDA and  has been authorized for detection and/or diagnosis of SARS-CoV-2 by FDA under an Emergency Use Authorization (EUA). This EUA will remain  in effect (meaning this test can be used) for the duration of the COVID-19 declaration under Section 564(b)(1) of the Act, 21 U.S.C.section 360bbb-3(b)(1), unless the authorization is terminated  or revoked sooner.       Influenza A by PCR NEGATIVE NEGATIVE Final   Influenza B by PCR NEGATIVE NEGATIVE Final    Comment: (NOTE) The Xpert Xpress SARS-CoV-2/FLU/RSV plus assay is intended as an aid in the diagnosis of influenza from Nasopharyngeal swab specimens and should not be used as a sole basis for treatment. Nasal washings and aspirates are unacceptable for Xpert Xpress SARS-CoV-2/FLU/RSV testing.  Fact Sheet for Patients: EntrepreneurPulse.com.au  Fact Sheet for Healthcare Providers: IncredibleEmployment.be  This test is not yet approved or cleared by the [place] FDA and has been authorized for detection and/or diagnosis of SARS-CoV-2 by FDA under an Emergency Use Authorization (EUA). This EUA will remain in effect (meaning this test can be used) for the duration of the COVID-19 declaration under Section 564(b)(1) of the Act, 21 U.S.C. section 360bbb-3(b)(1), unless the authorization is terminated or revoked.  Performed at [facility], [place], [place] [address]   SARS CORONAVIRUS 2 (TAT 6-24 HRS) Nasopharyngeal Nasopharyngeal Swab     Status: None   Collection Time: 07/23/20 12:27 PM   Specimen:  Nasopharyngeal Swab  Result Value Ref Range Status   SARS Coronavirus 2 NEGATIVE NEGATIVE Final    Comment: (NOTE) SARS-CoV-2 target nucleic acids are NOT DETECTED.  The SARS-CoV-2 RNA is generally detectable in upper and lower respiratory specimens during the acute phase of infection. Negative results do not preclude SARS-CoV-2 infection, do not rule out co-infections with other pathogens, and should not be used as the sole basis for treatment or other patient management decisions. Negative results must be combined with clinical observations, patient history, and epidemiological information. The expected result is Negative.  Fact Sheet for Patients: SugarRoll.be  Fact Sheet for Healthcare Providers: https://www.woods-mathews.com/  This test is not yet approved or cleared by the [place] FDA and  has been authorized for detection and/or diagnosis of SARS-CoV-2 by FDA under an Emergency Use Authorization (EUA). This EUA will remain  in effect (meaning this test can be used) for the duration of the COVID-19 declaration under Se ction 564(b)(1)  of the Act, 21 U.S.C. section 360bbb-3(b)(1), unless the authorization is terminated or revoked sooner.  Performed at [facility], [place] [address], [place], [place] [address]      Discharge Instructions:   Discharge Instructions    Diet - low sodium heart healthy   Complete by: As directed    Increase activity slowly   Complete by: As directed      Allergies as of 07/24/2020      Reactions   Accolate [zafirlukast] Other (See Comments)   Reaction: unknown   Bactrim [sulfamethoxazole-trimethoprim] Nausea And Vomiting   Metronidazole Other (See Comments)   Reaction: unknown   Nitrofurantoin Monohyd Macro Other (See Comments)   GI upset   Penicillins Diarrhea, Other (See Comments)   Has patient had a PCN reaction causing immediate rash, facial/tongue/throat swelling, SOB or  lightheadedness with hypotension: Unknown Has patient had a PCN reaction causing severe rash involving mucus membranes or skin necrosis: Unknown Has patient had a PCN reaction that required hospitalization: Unknown Has patient had a PCN reaction occurring within the last 10 years: Unknown If all of the above answers are "NO", then may proceed with Cephalosporin use.   Keflex [cephalexin] Rash      Medication List    TAKE these medications   acetaminophen 325 MG tablet Commonly known as: TYLENOL Take 2 tablets (650 mg total) by mouth every 6 (six) hours as needed for mild pain (or Fever >/= 101).   enalapril 10 MG tablet Commonly known as: VASOTEC TAKE 1 TABLET BY MOUTH EVERY DAY   methocarbamol 500 MG tablet Commonly known as: ROBAXIN Take 1 tablet (500 mg total) by mouth every 8 (eight) hours as needed for up to 5 days for muscle spasms.   metoprolol tartrate 25 MG tablet Commonly known as: LOPRESSOR TAKE 1/2 TABLET BY MOUTH TWICE A DAY   torsemide 20 MG tablet Commonly known as: DEMADEX Take 20 mg by mouth every morning.   traZODone 50 MG tablet Commonly known as: DESYREL Take 0.5 tablets (25 mg total) by mouth at bedtime as needed for sleep.       Contact information for follow-up providers    Lovell Sheehan, MD. Schedule an appointment as soon as possible for a visit in 1 week(s).   Specialty: Orthopedic Surgery Contact information: [place] [phone]            Contact information for after-discharge care    [place] [facility] .   Service: Skilled Nursing Contact information: [place] [phone]                   Time coordinating discharge: 32 minutes  Signed:  Jennye Boroughs  [facility] 07/24/2020, 12:03 PM   Pager on [email or web address]. If 7PM-7AM, please contact  night-coverage at [email or web address]

## 2020-07-25 DIAGNOSIS — M8000XD Age-related osteoporosis with current pathological fracture, unspecified site, subsequent encounter for fracture with routine healing: Secondary | ICD-10-CM | POA: Diagnosis not present

## 2020-07-25 DIAGNOSIS — I10 Essential (primary) hypertension: Secondary | ICD-10-CM | POA: Diagnosis not present

## 2020-07-25 DIAGNOSIS — Z8659 Personal history of other mental and behavioral disorders: Secondary | ICD-10-CM | POA: Insufficient documentation

## 2020-07-26 ENCOUNTER — Ambulatory Visit: Payer: Self-pay | Admitting: Adult Health

## 2020-07-26 DIAGNOSIS — N1831 Chronic kidney disease, stage 3a: Secondary | ICD-10-CM | POA: Diagnosis not present

## 2020-07-26 DIAGNOSIS — I5032 Chronic diastolic (congestive) heart failure: Secondary | ICD-10-CM | POA: Diagnosis not present

## 2020-07-29 DIAGNOSIS — I5032 Chronic diastolic (congestive) heart failure: Secondary | ICD-10-CM | POA: Diagnosis not present

## 2020-07-29 DIAGNOSIS — R413 Other amnesia: Secondary | ICD-10-CM | POA: Insufficient documentation

## 2020-07-29 DIAGNOSIS — J9611 Chronic respiratory failure with hypoxia: Secondary | ICD-10-CM | POA: Diagnosis not present

## 2020-07-29 DIAGNOSIS — I10 Essential (primary) hypertension: Secondary | ICD-10-CM | POA: Diagnosis not present

## 2020-07-29 DIAGNOSIS — M8000XD Age-related osteoporosis with current pathological fracture, unspecified site, subsequent encounter for fracture with routine healing: Secondary | ICD-10-CM | POA: Diagnosis not present

## 2020-07-29 DIAGNOSIS — N1831 Chronic kidney disease, stage 3a: Secondary | ICD-10-CM | POA: Diagnosis not present

## 2020-08-06 ENCOUNTER — Telehealth: Payer: Self-pay

## 2020-08-06 DIAGNOSIS — I10 Essential (primary) hypertension: Secondary | ICD-10-CM

## 2020-08-06 MED ORDER — METOPROLOL TARTRATE 25 MG PO TABS: 12.5000 mg | ORAL_TABLET | Freq: Two times a day (BID) | ORAL | 2 refills | Status: DC

## 2020-08-06 NOTE — Telephone Encounter (Signed)
L.O.V. was on 06/21/20 and next visit is on 05/06/2021. Medication was send into pharmacy.

## 2020-08-06 NOTE — Telephone Encounter (Signed)
[facility] faxed refill request for the following medications:  metoprolol tartrate (LOPRESSOR) 25 MG tablet  Please advise.

## 2020-08-14 DIAGNOSIS — S329XXD Fracture of unspecified parts of lumbosacral spine and pelvis, subsequent encounter for fracture with routine healing: Secondary | ICD-10-CM | POA: Diagnosis not present

## 2020-08-14 DIAGNOSIS — S32512D Fracture of superior rim of left pubis, subsequent encounter for fracture with routine healing: Secondary | ICD-10-CM | POA: Diagnosis not present

## 2020-08-14 DIAGNOSIS — M1990 Unspecified osteoarthritis, unspecified site: Secondary | ICD-10-CM | POA: Diagnosis not present

## 2020-08-14 DIAGNOSIS — W19XXXD Unspecified fall, subsequent encounter: Secondary | ICD-10-CM | POA: Diagnosis not present

## 2020-08-14 DIAGNOSIS — J45909 Unspecified asthma, uncomplicated: Secondary | ICD-10-CM | POA: Diagnosis not present

## 2020-08-14 DIAGNOSIS — J9611 Chronic respiratory failure with hypoxia: Secondary | ICD-10-CM | POA: Diagnosis not present

## 2020-08-14 DIAGNOSIS — I130 Hypertensive heart and chronic kidney disease with heart failure and stage 1 through stage 4 chronic kidney disease, or unspecified chronic kidney disease: Secondary | ICD-10-CM | POA: Diagnosis not present

## 2020-08-14 DIAGNOSIS — M810 Age-related osteoporosis without current pathological fracture: Secondary | ICD-10-CM | POA: Diagnosis not present

## 2020-08-14 DIAGNOSIS — G4700 Insomnia, unspecified: Secondary | ICD-10-CM | POA: Diagnosis not present

## 2020-08-14 DIAGNOSIS — N183 Chronic kidney disease, stage 3 unspecified: Secondary | ICD-10-CM | POA: Diagnosis not present

## 2020-08-14 DIAGNOSIS — I5032 Chronic diastolic (congestive) heart failure: Secondary | ICD-10-CM | POA: Diagnosis not present

## 2020-08-16 ENCOUNTER — Telehealth: Payer: Self-pay

## 2020-08-16 ENCOUNTER — Other Ambulatory Visit: Payer: Self-pay | Admitting: Family Medicine

## 2020-08-16 DIAGNOSIS — S32512A Fracture of superior rim of left pubis, initial encounter for closed fracture: Secondary | ICD-10-CM

## 2020-08-16 MED ORDER — TRAMADOL HCL 50 MG PO TABS: 50.0000 mg | ORAL_TABLET | Freq: Three times a day (TID) | ORAL | 0 refills | Status: AC | PRN

## 2020-08-16 NOTE — Telephone Encounter (Signed)
Copied from [place] [id number]. Topic: General - Inquiry >> Aug 16, 2020 10:56 AM Greggory Keen D wrote: Reason for CRM: Laurell Roof with [facility] called regarding pt's PT eval.  Pt is still having a lot of pain from the pelvic fx.  She is only taking tylenol and they want to know if she can get something stronger as tramadol  [facility]  CB#  [phone]

## 2020-08-16 NOTE — Telephone Encounter (Signed)
[facility] to [facility]. Hold Trazodone she takes at bedtime while using the Tramadol. Follow up with Dr. Harlow Mares (orthopedic surgeon) if additional problems.

## 2020-08-16 NOTE — Progress Notes (Signed)
Having uncontrolled pain from closed left superior ramus fracture. May hold Trazodone at bedtime and give Tramadol for pain. Follow up with Dr. Harlow Mares (orthopedic surgeon) if worsening.

## 2020-08-19 NOTE — Telephone Encounter (Signed)
Barnetta Chapel from [employer] advised.   Thanks,   -Mickel Baas

## 2020-08-19 NOTE — Telephone Encounter (Signed)
LMTCB

## 2020-09-02 ENCOUNTER — Other Ambulatory Visit: Payer: Self-pay

## 2020-09-02 ENCOUNTER — Ambulatory Visit (INDEPENDENT_AMBULATORY_CARE_PROVIDER_SITE_OTHER): Payer: PPO | Admitting: Family Medicine

## 2020-09-02 ENCOUNTER — Encounter: Payer: Self-pay | Admitting: Family Medicine

## 2020-09-02 VITALS — BP 165/65 | HR 70 | Temp 97.7°F | Wt 122.0 lb

## 2020-09-02 DIAGNOSIS — R413 Other amnesia: Secondary | ICD-10-CM | POA: Diagnosis not present

## 2020-09-02 DIAGNOSIS — I10 Essential (primary) hypertension: Secondary | ICD-10-CM

## 2020-09-02 DIAGNOSIS — R609 Edema, unspecified: Secondary | ICD-10-CM

## 2020-09-02 DIAGNOSIS — N183 Chronic kidney disease, stage 3 unspecified: Secondary | ICD-10-CM

## 2020-09-02 NOTE — Progress Notes (Signed)
Acute Office Visit  Subjective:    Patient ID: [name], female    DOB: 03/12/1925, 87 y.o.   MRN: [id number]  No chief complaint on file.   HPI Patient is in today to be evaluated for her memory and cognition  Past Medical History:  Diagnosis Date  . Breast cyst    removed  . Diverticulitis   . Gallstones   . Hypertension   . Low sodium levels     Past Surgical History:  Procedure Laterality Date  . ABDOMINAL HYSTERECTOMY  2000  . APPENDECTOMY  1940's  . bladder tack  1990's  . BREAST CYST INCISION AND DRAINAGE Left 1990's  . CHOLECYSTECTOMY  09/11/13   Cholangiogram suggested a retained stone ductal dilatation.  Marland Kitchen ERCP W/ SPHICTEROTOMY  09/12/2013   Recurrent stone identified post cholecystectomy.  Marland Kitchen NASAL SINUS SURGERY      Family History  Problem Relation Age of Onset  . Hypertension Father   . CAD Father   . Cancer Brother        unknown type    Social History   Socioeconomic History  . Marital status: Widowed    Spouse name: Not on file  . Number of children: 1  . Years of education: Not on file  . Highest education level: 12th grade  Occupational History  . Occupation: retired  Tobacco Use  . Smoking status: Never Smoker  . Smokeless tobacco: Never Used  Vaping Use  . Vaping Use: Never used  Substance and Sexual Activity  . Alcohol use: No  . Drug use: No  . Sexual activity: Not Currently  Other Topics Concern  . Not on file  Social History Narrative  . Not on file   Social Determinants of Health   Financial Resource Strain: Low Risk   . Difficulty of Paying Living Expenses: Not hard at all  Food Insecurity: No Food Insecurity  . Worried About Charity fundraiser in the Last Year: Never true  . Ran Out of Food in the Last Year: Never true  Transportation Needs: No Transportation Needs  . Lack of Transportation (Medical): No  . Lack of Transportation (Non-Medical): No  Physical Activity: Inactive  . Days of Exercise per Week: 0  days  . Minutes of Exercise per Session: 0 min  Stress: No Stress Concern Present  . Feeling of Stress : Not at all  Social Connections: Socially Isolated  . Frequency of Communication with Friends and Family: More than three times a week  . Frequency of Social Gatherings with Friends and Family: More than three times a week  . Attends Religious Services: Never  . Active Member of Clubs or Organizations: No  . Attends Archivist Meetings: Never  . Marital Status: Widowed  Intimate Partner Violence: Not At Risk  . Fear of Current or Ex-Partner: No  . Emotionally Abused: No  . Physically Abused: No  . Sexually Abused: No    Outpatient Medications Prior to Visit  Medication Sig Dispense Refill  . acetaminophen (TYLENOL) 325 MG tablet Take 2 tablets (650 mg total) by mouth every 6 (six) hours as needed for mild pain (or Fever >/= 101).    . enalapril (VASOTEC) 10 MG tablet TAKE 1 TABLET BY MOUTH EVERY DAY (Patient taking differently: Take 10 mg by mouth daily.) 30 tablet 0  . metoprolol tartrate (LOPRESSOR) 25 MG tablet Take 0.5 tablets (12.5 mg total) by mouth 2 (two) times daily. 180 tablet 2  .  torsemide (DEMADEX) 20 MG tablet Take 20 mg by mouth every morning.  11  . traZODone (DESYREL) 50 MG tablet Take 0.5 tablets (25 mg total) by mouth at bedtime as needed for sleep. 90 tablet 0   No facility-administered medications prior to visit.    Allergies  Allergen Reactions  . Accolate [Zafirlukast] Other (See Comments)    Reaction: unknown  . Bactrim [Sulfamethoxazole-Trimethoprim] Nausea And Vomiting  . Metronidazole Other (See Comments)    Reaction: unknown  . Nitrofurantoin Monohyd Macro Other (See Comments)    GI upset  . Penicillins Diarrhea and Other (See Comments)    Has patient had a PCN reaction causing immediate rash, facial/tongue/throat swelling, SOB or lightheadedness with hypotension: Unknown Has patient had a PCN reaction causing severe rash involving mucus  membranes or skin necrosis: Unknown Has patient had a PCN reaction that required hospitalization: Unknown Has patient had a PCN reaction occurring within the last 10 years: Unknown If all of the above answers are "NO", then may proceed with Cephalosporin use.  Marland Kitchen Keflex [Cephalexin] Rash    Review of Systems  Psychiatric/Behavioral: Positive for agitation, confusion, decreased concentration and hallucinations. Negative for behavioral problems, dysphoric mood and sleep disturbance. The patient is not nervous/anxious.        Memory loss and possible hallucinations       Objective:    Physical Exam Constitutional:      General: She is not in acute distress.    Appearance: She is well-developed.  HENT:     Head: Normocephalic and atraumatic.     Right Ear: Hearing normal.     Left Ear: Hearing normal.     Nose: Nose normal.  Eyes:     General: Lids are normal. No scleral icterus.       Right eye: No discharge.        Left eye: No discharge.     Conjunctiva/sclera: Conjunctivae normal.  Cardiovascular:     Rate and Rhythm: Normal rate and regular rhythm.     Heart sounds: Normal heart sounds.  Pulmonary:     Effort: Pulmonary effort is normal. No respiratory distress.     Breath sounds: Normal breath sounds.  Abdominal:     General: Bowel sounds are normal.  Musculoskeletal:        General: Normal range of motion.     Cervical back: Neck supple.  Skin:    Findings: No lesion or rash.  Neurological:     Mental Status: She is alert and oriented to person, place, and time.  Psychiatric:        Speech: Speech normal.        Behavior: Behavior normal.        Thought Content: Thought content normal.        Cognition and Memory: Memory is impaired.     BP (!) 165/65 (BP Location: Right Arm, Patient Position: Sitting, Cuff Size: Normal)   Pulse 70   Temp 97.7 F (36.5 C) (Oral)   Wt 122 lb (55.3 kg)   SpO2 100%   BMI 20.94 kg/m  Wt Readings from Last 3 Encounters:   09/02/20 122 lb (55.3 kg)  07/21/20 121 lb 4.1 oz (55 kg)  06/21/20 121 lb 12.8 oz (55.2 kg)   BP Readings from Last 3 Encounters:  09/02/20 (!) 165/65  07/24/20 131/67  06/21/20 (!) 181/55     Health Maintenance Due  Topic Date Due  . DEXA SCAN  02/17/2014  . COVID-19 Vaccine (  2 - Pfizer 3-dose series) 08/14/2020    There are no preventive care reminders to display for this patient.   Lab Results  Component Value Date   TSH 1.270 06/21/2020   Lab Results  Component Value Date   WBC 5.7 07/24/2020   HGB 8.5 (L) 07/24/2020   HCT 25.7 (L) 07/24/2020   MCV 96.6 07/24/2020   PLT 136 (L) 07/24/2020   Lab Results  Component Value Date   NA 134 (L) 07/24/2020   K 4.7 07/24/2020   CO2 23 07/24/2020   GLUCOSE 106 (H) 07/24/2020   BUN 50 (H) 07/24/2020   CREATININE 1.45 (H) 07/24/2020   BILITOT 1.0 07/21/2020   ALKPHOS 71 07/21/2020   AST 33 07/21/2020   ALT 15 07/21/2020   PROT 7.3 07/21/2020   ALBUMIN 4.2 07/21/2020   CALCIUM 9.4 07/24/2020   ANIONGAP 7 07/24/2020   EGFR 39 (L) 06/21/2020   Lab Results  Component Value Date   CHOL 177 01/14/2016   Lab Results  Component Value Date   HDL 62 01/14/2016   Lab Results  Component Value Date   LDLCALC 89 01/14/2016   Lab Results  Component Value Date   TRIG 128 01/14/2016   Lab Results  Component Value Date   CHOLHDL 2.9 01/14/2016   No results found for: HGBA1C     Assessment & Plan:   1. Bad memory Family concerned about unconfirmed memories she reports. Some "Sundowning" noted. Recheck labs for anemia, electrolyte imbalance or other metabolic disorders. Eating 3 meals a day and sleeping well. - CBC with Differential/Platelet - Comprehensive metabolic panel - TSH  2. Stage 3 chronic kidney disease, unspecified whether stage 3a or 3b CKD (HCC) History of hyponatremia with stage 3 CKD. Recheck labs. - CBC with Differential/Platelet - Comprehensive metabolic panel  3. Peripheral edema Noticed  at the end of the day. Helped by use of Torsemide. - CBC with Differential/Platelet - Comprehensive metabolic panel  4. Essential (primary) hypertension A little elevated BP today. Family states she gets all her medication regularly by a dispensing machine at home. Recheck labs. - CBC with Differential/Platelet - Comprehensive metabolic panel - TSH       Juluis Mire, CMA   I, Gwendolyn Nishi, PA-C, have reviewed all documentation for this visit. The documentation on 09/02/20 for the exam, diagnosis, procedures, and orders are all accurate and complete.

## 2020-09-03 LAB — CBC WITH DIFFERENTIAL/PLATELET
Basophils Absolute: 0 10*3/uL (ref 0.0–0.2)
Basos: 0 %
EOS (ABSOLUTE): 0.1 10*3/uL (ref 0.0–0.4)
Eos: 2 %
Hematocrit: 30.4 % — ABNORMAL LOW (ref 34.0–46.6)
Hemoglobin: 10.2 g/dL — ABNORMAL LOW (ref 11.1–15.9)
Immature Grans (Abs): 0 10*3/uL (ref 0.0–0.1)
Immature Granulocytes: 0 %
Lymphocytes Absolute: 1.5 10*3/uL (ref 0.7–3.1)
Lymphs: 29 %
MCH: 32.3 pg (ref 26.6–33.0)
MCHC: 33.6 g/dL (ref 31.5–35.7)
MCV: 96 fL (ref 79–97)
Monocytes Absolute: 0.5 10*3/uL (ref 0.1–0.9)
Monocytes: 10 %
Neutrophils Absolute: 3 10*3/uL (ref 1.4–7.0)
Neutrophils: 59 %
Platelets: 176 10*3/uL (ref 150–450)
RBC: 3.16 x10E6/uL — ABNORMAL LOW (ref 3.77–5.28)
RDW: 12.7 % (ref 11.7–15.4)
WBC: 5.2 10*3/uL (ref 3.4–10.8)

## 2020-09-03 LAB — COMPREHENSIVE METABOLIC PANEL
ALT: 8 IU/L (ref 0–32)
AST: 23 IU/L (ref 0–40)
Albumin/Globulin Ratio: 2.2 (ref 1.2–2.2)
Albumin: 4.3 g/dL (ref 3.5–4.6)
Alkaline Phosphatase: 170 IU/L — ABNORMAL HIGH (ref 44–121)
BUN/Creatinine Ratio: 18 (ref 12–28)
BUN: 28 mg/dL (ref 10–36)
Bilirubin Total: 0.2 mg/dL (ref 0.0–1.2)
CO2: 20 mmol/L (ref 20–29)
Calcium: 9.4 mg/dL (ref 8.7–10.3)
Chloride: 102 mmol/L (ref 96–106)
Creatinine, Ser: 1.57 mg/dL — ABNORMAL HIGH (ref 0.57–1.00)
Globulin, Total: 2 g/dL (ref 1.5–4.5)
Glucose: 94 mg/dL (ref 65–99)
Potassium: 4.2 mmol/L (ref 3.5–5.2)
Sodium: 139 mmol/L (ref 134–144)
Total Protein: 6.3 g/dL (ref 6.0–8.5)
eGFR: 31 mL/min/{1.73_m2} — ABNORMAL LOW (ref >59)

## 2020-09-03 LAB — TSH: TSH: 1.89 u[IU]/mL (ref 0.450–4.500)

## 2020-09-16 ENCOUNTER — Other Ambulatory Visit: Payer: Self-pay | Admitting: Adult Health

## 2020-09-29 ENCOUNTER — Other Ambulatory Visit: Payer: Self-pay | Admitting: Family Medicine

## 2020-09-29 DIAGNOSIS — I10 Essential (primary) hypertension: Secondary | ICD-10-CM

## 2020-09-29 NOTE — Telephone Encounter (Signed)
Requested medication (s) are due for refill today: yes  Requested medication (s) are on the active medication list: yes  Last refill:  07/14/20 #30  Future visit scheduled: no  Notes to clinic:  overdue lab work. Last Cr 01/2017   Requested Prescriptions  Pending Prescriptions Disp Refills   enalapril (VASOTEC) 10 MG tablet [Pharmacy Med Name: ENALAPRIL MALEATE 10 MG TAB] 30 tablet 0    Sig: TAKE 1 TABLET BY MOUTH EVERY DAY      Cardiovascular:  ACE Inhibitors Failed - 09/29/2020 12:30 PM      Failed - Cr in normal range and within 180 days    Creat  Date Value Ref Range Status  02/05/2017 0.84 0.60 - 0.88 mg/dL Final    Comment:    For patients >49 years of age, the reference limit for Creatinine is approximately 13% higher for people identified as African-American. .    Creatinine, Ser  Date Value Ref Range Status  09/02/2020 1.57 (H) 0.57 - 1.00 mg/dL Final   Creatinine, Urine  Date Value Ref Range Status  01/14/2017 48 mg/dL Final  01/14/2017 47 mg/dL Final          Failed - Last BP in normal range    BP Readings from Last 1 Encounters:  09/02/20 (!) 165/65          Passed - K in normal range and within 180 days    Potassium  Date Value Ref Range Status  09/02/2020 4.2 3.5 - 5.2 mmol/L Final  09/13/2013 3.7 3.5 - 5.1 mmol/L Final          Passed - Patient is not pregnant      Passed - Valid encounter within last 6 months    Recent Outpatient Visits           3 weeks ago [place], PA-C   3 months ago Insomnia, unspecified type   [facility], Kelby Aline, [place]   1 year ago Primary insomnia   [facility], Vickki Muff, PA-C   1 year ago Essential (primary) hypertension   [facility], Vickki Muff, PA-C   2 years ago Essential (primary) hypertension   [facility], Vickki Muff, PA-C

## 2020-10-25 DIAGNOSIS — L03116 Cellulitis of left lower limb: Secondary | ICD-10-CM | POA: Diagnosis not present

## 2020-10-25 DIAGNOSIS — B351 Tinea unguium: Secondary | ICD-10-CM | POA: Diagnosis not present

## 2020-11-04 ENCOUNTER — Ambulatory Visit: Payer: PPO | Admitting: Family Medicine

## 2020-11-08 DIAGNOSIS — M79675 Pain in left toe(s): Secondary | ICD-10-CM | POA: Diagnosis not present

## 2020-11-08 DIAGNOSIS — M2041 Other hammer toe(s) (acquired), right foot: Secondary | ICD-10-CM | POA: Diagnosis not present

## 2020-11-08 DIAGNOSIS — M2042 Other hammer toe(s) (acquired), left foot: Secondary | ICD-10-CM | POA: Diagnosis not present

## 2020-11-08 DIAGNOSIS — B351 Tinea unguium: Secondary | ICD-10-CM | POA: Diagnosis not present

## 2020-11-08 DIAGNOSIS — M79674 Pain in right toe(s): Secondary | ICD-10-CM | POA: Diagnosis not present

## 2020-11-08 DIAGNOSIS — L600 Ingrowing nail: Secondary | ICD-10-CM | POA: Diagnosis not present

## 2020-11-21 ENCOUNTER — Emergency Department: Payer: PPO

## 2020-11-21 ENCOUNTER — Other Ambulatory Visit: Payer: Self-pay

## 2020-11-21 ENCOUNTER — Inpatient Hospital Stay
Admission: EM | Admit: 2020-11-21 | Discharge: 2020-11-25 | DRG: 312 | Disposition: A | Discharge status: Home / Self Care | Payer: PPO | Attending: Internal Medicine | Admitting: Internal Medicine

## 2020-11-21 DIAGNOSIS — I130 Hypertensive heart and chronic kidney disease with heart failure and stage 1 through stage 4 chronic kidney disease, or unspecified chronic kidney disease: Secondary | ICD-10-CM | POA: Diagnosis not present

## 2020-11-21 DIAGNOSIS — Z882 Allergy status to sulfonamides status: Secondary | ICD-10-CM

## 2020-11-21 DIAGNOSIS — E872 Acidosis: Secondary | ICD-10-CM | POA: Diagnosis present

## 2020-11-21 DIAGNOSIS — Z881 Allergy status to other antibiotic agents status: Secondary | ICD-10-CM | POA: Diagnosis not present

## 2020-11-21 DIAGNOSIS — Z20822 Contact with and (suspected) exposure to covid-19: Secondary | ICD-10-CM | POA: Diagnosis present

## 2020-11-21 DIAGNOSIS — F0390 Unspecified dementia without behavioral disturbance: Secondary | ICD-10-CM | POA: Diagnosis not present

## 2020-11-21 DIAGNOSIS — E875 Hyperkalemia: Secondary | ICD-10-CM | POA: Diagnosis present

## 2020-11-21 DIAGNOSIS — N1831 Chronic kidney disease, stage 3a: Secondary | ICD-10-CM | POA: Diagnosis present

## 2020-11-21 DIAGNOSIS — E860 Dehydration: Principal | ICD-10-CM | POA: Diagnosis present

## 2020-11-21 DIAGNOSIS — Z880 Allergy status to penicillin: Secondary | ICD-10-CM | POA: Diagnosis not present

## 2020-11-21 DIAGNOSIS — D696 Thrombocytopenia, unspecified: Secondary | ICD-10-CM | POA: Diagnosis present

## 2020-11-21 DIAGNOSIS — Z888 Allergy status to other drugs, medicaments and biological substances status: Secondary | ICD-10-CM | POA: Diagnosis not present

## 2020-11-21 DIAGNOSIS — R410 Disorientation, unspecified: Secondary | ICD-10-CM

## 2020-11-21 DIAGNOSIS — R0902 Hypoxemia: Secondary | ICD-10-CM | POA: Diagnosis not present

## 2020-11-21 DIAGNOSIS — Z79899 Other long term (current) drug therapy: Secondary | ICD-10-CM | POA: Diagnosis not present

## 2020-11-21 DIAGNOSIS — R4189 Other symptoms and signs involving cognitive functions and awareness: Secondary | ICD-10-CM | POA: Diagnosis not present

## 2020-11-21 DIAGNOSIS — F32A Depression, unspecified: Secondary | ICD-10-CM | POA: Diagnosis present

## 2020-11-21 DIAGNOSIS — R55 Syncope and collapse: Principal | ICD-10-CM | POA: Diagnosis present

## 2020-11-21 DIAGNOSIS — I959 Hypotension, unspecified: Secondary | ICD-10-CM | POA: Diagnosis not present

## 2020-11-21 DIAGNOSIS — N184 Chronic kidney disease, stage 4 (severe): Secondary | ICD-10-CM

## 2020-11-21 DIAGNOSIS — N390 Urinary tract infection, site not specified: Secondary | ICD-10-CM | POA: Diagnosis present

## 2020-11-21 DIAGNOSIS — G319 Degenerative disease of nervous system, unspecified: Secondary | ICD-10-CM | POA: Diagnosis not present

## 2020-11-21 DIAGNOSIS — I5032 Chronic diastolic (congestive) heart failure: Secondary | ICD-10-CM | POA: Diagnosis present

## 2020-11-21 DIAGNOSIS — N179 Acute kidney failure, unspecified: Secondary | ICD-10-CM | POA: Diagnosis not present

## 2020-11-21 DIAGNOSIS — Z8249 Family history of ischemic heart disease and other diseases of the circulatory system: Secondary | ICD-10-CM | POA: Diagnosis not present

## 2020-11-21 LAB — URINALYSIS, COMPLETE (UACMP) WITH MICROSCOPIC
Bilirubin Urine: NEGATIVE
Glucose, UA: NEGATIVE mg/dL
Hgb urine dipstick: NEGATIVE
Ketones, ur: NEGATIVE mg/dL
Nitrite: NEGATIVE
Protein, ur: NEGATIVE mg/dL
Specific Gravity, Urine: 1.01 (ref 1.005–1.030)
pH: 5 (ref 5.0–8.0)

## 2020-11-21 LAB — CBC WITH DIFFERENTIAL/PLATELET
Abs Immature Granulocytes: 0.02 10*3/uL (ref 0.00–0.07)
Basophils Absolute: 0 10*3/uL (ref 0.0–0.1)
Basophils Relative: 0 %
Eosinophils Absolute: 0 10*3/uL (ref 0.0–0.5)
Eosinophils Relative: 0 %
HCT: 33.7 % — ABNORMAL LOW (ref 36.0–46.0)
Hemoglobin: 11 g/dL — ABNORMAL LOW (ref 12.0–15.0)
Immature Granulocytes: 0 %
Lymphocytes Relative: 21 %
Lymphs Abs: 1.2 10*3/uL (ref 0.7–4.0)
MCH: 31.9 pg (ref 26.0–34.0)
MCHC: 32.6 g/dL (ref 30.0–36.0)
MCV: 97.7 fL (ref 80.0–100.0)
Monocytes Absolute: 0.3 10*3/uL (ref 0.1–1.0)
Monocytes Relative: 5 %
Neutro Abs: 4 10*3/uL (ref 1.7–7.7)
Neutrophils Relative %: 74 %
Platelets: 123 10*3/uL — ABNORMAL LOW (ref 150–400)
RBC: 3.45 MIL/uL — ABNORMAL LOW (ref 3.87–5.11)
RDW: 13 % (ref 11.5–15.5)
WBC: 5.6 10*3/uL (ref 4.0–10.5)
nRBC: 0 % (ref 0.0–0.2)

## 2020-11-21 LAB — BASIC METABOLIC PANEL
Anion gap: 9 (ref 5–15)
BUN: 60 mg/dL — ABNORMAL HIGH (ref 8–23)
CO2: 20 mmol/L — ABNORMAL LOW (ref 22–32)
Calcium: 9 mg/dL (ref 8.9–10.3)
Chloride: 104 mmol/L (ref 98–111)
Creatinine, Ser: 2.58 mg/dL — ABNORMAL HIGH (ref 0.44–1.00)
GFR, Estimated: 17 mL/min — ABNORMAL LOW (ref >60)
Glucose, Bld: 109 mg/dL — ABNORMAL HIGH (ref 70–99)
Potassium: 5.2 mmol/L — ABNORMAL HIGH (ref 3.5–5.1)
Sodium: 133 mmol/L — ABNORMAL LOW (ref 135–145)

## 2020-11-21 LAB — TROPONIN I (HIGH SENSITIVITY)
Troponin I (High Sensitivity): 28 ng/L — ABNORMAL HIGH (ref <18)
Troponin I (High Sensitivity): 31 ng/L — ABNORMAL HIGH (ref <18)

## 2020-11-21 MED ORDER — CIPROFLOXACIN IN D5W 400 MG/200ML IV SOLN
400.0000 mg | Freq: Once | INTRAVENOUS | Status: AC
  Administered 2020-11-21: 400 mg via INTRAVENOUS
  Filled 2020-11-21: qty 200

## 2020-11-21 MED ORDER — LACTATED RINGERS IV BOLUS
1000.0000 mL | Freq: Once | INTRAVENOUS | Status: AC
  Administered 2020-11-21: 1000 mL via INTRAVENOUS

## 2020-11-21 NOTE — ED Notes (Signed)
Pt ambulatory to bathroom with standby assist.

## 2020-11-21 NOTE — ED Provider Notes (Signed)
[facility] Emergency Department Provider Note   ____________________________________________   I have reviewed the triage vital signs and the nursing notes.   HISTORY  Chief Complaint None per patient   History limited by: Dementia, history obtained from family Katheren Puller) over the telephone   HPI [name] is a 87 y.o. female who presents to the emergency department today because of concern for a syncopal episode. The patient herself is not clear why EMS was called. Per the family member I talked to on the phone EMS was called after she passed out. The patient was going to use the bathroom when the family member noticed she looked pale and was sweating. She then sat on the toilet and went limp and unresponsive. The patient was passed out for roughly 5 minutes. Per the family member the patient has passed out in the past when her sodium has been low. No recent illness that family member is aware of. The patient was complaining of some abdominal pain and nausea over the past couple of days.    Records reviewed. Per medical record review patient has a history of diverticulitis, HTN, low sodium levels.  Past Medical History:  Diagnosis Date   Breast cyst    removed   Diverticulitis    Gallstones    Hypertension    Low sodium levels     Patient Active Problem List   Diagnosis Date Noted   Closed fracture of left superior pubic ramus ([place]) 07/21/2020   Closed pelvic fracture ([place]) 07/21/2020   Chronic diastolic CHF (congestive heart failure) ([place]) 07/21/2020   Fall at home, initial encounter 07/21/2020   CKD (chronic kidney disease), stage IIIa 07/21/2020   Elevated CK 07/21/2020   Syncope 08/17/2017   Hyponatremia 01/11/2017   Arthritis of both hands 01/14/2016   Acute UTI 08/21/2015   Scalp laceration 07/29/2015   Fall 07/29/2015   Near syncope 07/21/2015   Foot pain 12/31/2014   Anxiety 10/17/2014   Allergic rhinitis 10/16/2014    Airway hyperreactivity 10/16/2014   Biliary calculi, common bile duct 10/16/2014   Clinical depression 10/16/2014   Acquired clavicle deformity 123XX123   Clicking shoulder 123XX123   Essential (primary) hypertension 10/16/2014   H/O gastrointestinal hemorrhage 10/16/2014   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   Below normal amount of sodium in the blood 10/16/2014   Insomnia 10/16/2014   Bad memory 10/16/2014   OP (osteoporosis) 10/16/2014   Cyst of pancreas 10/16/2014   Temporary cerebral vascular dysfunction 10/16/2014   Other constipation 03/07/2014   Right groin pain 03/07/2014   Internal hemorrhoid 03/07/2014   Gallstones, common bile duct 08/29/2013   Cholelithiasis 08/29/2013   CYST AND PSEUDOCYST OF PANCREAS 03/12/2008    Past Surgical History:  Procedure Laterality Date   ABDOMINAL HYSTERECTOMY  2000   APPENDECTOMY  1940's   bladder tack  1990's   BREAST CYST INCISION AND DRAINAGE Left 1990's   CHOLECYSTECTOMY  09/11/13   Cholangiogram suggested a retained stone ductal dilatation.   ERCP W/ SPHICTEROTOMY  09/12/2013   Recurrent stone identified post cholecystectomy.   NASAL SINUS SURGERY      Prior to Admission medications   Medication Sig Start Date End Date Taking? Authorizing Provider  enalapril (VASOTEC) 10 MG tablet Take 1 tablet (10 mg total) by mouth daily. 09/30/20   Chrismon, Vickki Muff, PA-C  acetaminophen (TYLENOL) 325 MG tablet Take 2 tablets (650 mg total) by mouth every 6 (six) hours as needed for mild pain (or Fever >/=  101). 07/23/15   Gouru, Illene Silver, MD  metoprolol tartrate (LOPRESSOR) 25 MG tablet Take 0.5 tablets (12.5 mg total) by mouth 2 (two) times daily. 08/06/20   Chrismon, Vickki Muff, PA-C  torsemide (DEMADEX) 20 MG tablet Take 20 mg by mouth every morning. 09/30/17   [provider]  traZODone (DESYREL) 50 MG tablet Take 1/2 to 1 tablet by mouth at bedtime for sleep. 10/03/20   Chrismon, Vickki Muff, PA-C    Allergies Accolate [zafirlukast],  Bactrim [sulfamethoxazole-trimethoprim], Metronidazole, Nitrofurantoin monohyd macro, Penicillins, and Keflex [cephalexin]  Family History  Problem Relation Age of Onset   Hypertension Father    CAD Father    Cancer Brother        unknown type    Social History Social History   Tobacco Use   Smoking status: Never   Smokeless tobacco: Never  Vaping Use   Vaping Use: Never used  Substance Use Topics   Alcohol use: No   Drug use: No    Review of Systems Constitutional: No fever/chills Eyes: No visual changes. ENT: No sore throat. Cardiovascular: Denies chest pain. Respiratory: Denies shortness of breath. Gastrointestinal: Positive for abdominal pain, nausea.  Genitourinary: Negative for dysuria. Musculoskeletal: Negative for back pain. Skin: Negative for rash. Neurological: Negative for headaches, focal weakness or numbness.  ____________________________________________   PHYSICAL EXAM:  VITAL SIGNS: ED Triage Vitals  Enc Vitals Group     BP 11/21/20 1451 (!) 137/52     Pulse Rate 11/21/20 1451 69     Resp 11/21/20 1451 18     Temp 11/21/20 1451 (!) 97.5 F (36.4 C)     Temp Source 11/21/20 1451 Oral     SpO2 11/21/20 1451 97 %     Weight 11/21/20 1453 121 lb 14.6 oz (55.3 kg)     Height 11/21/20 1453 '5\' 4"'$  (1.626 m)     Head Circumference --      Peak Flow --      Pain Score 11/21/20 1452 0   Constitutional: Awake and alert.  Eyes: Conjunctivae are normal.  ENT      Head: Normocephalic and atraumatic.      Nose: No congestion/rhinnorhea.      Mouth/Throat: Mucous membranes are moist.      Neck: No stridor. Hematological/Lymphatic/Immunilogical: No cervical lymphadenopathy. Cardiovascular: Normal rate, regular rhythm.  No murmurs, rubs, or gallops.  Respiratory: Normal respiratory effort without tachypnea nor retractions. Breath sounds are clear and equal bilaterally. No wheezes/rales/rhonchi. Gastrointestinal: Soft and non tender. No rebound. No  guarding.  Genitourinary: Deferred Musculoskeletal: Normal range of motion in all extremities. No lower extremity edema. Neurologic:  Awake and alert. Not completely oriented. Moving all extremities.  Skin:  Skin is warm, dry and intact. No rash noted. Psychiatric: Mood and affect are normal. Speech and behavior are normal. Patient exhibits appropriate insight and judgment.  ____________________________________________    LABS (pertinent positives/negatives)  UA clear, small leukocytes, 0-5 RBC, 6-10 WBC, rare bacteria Trop hs 28 to 31 BMP na 133, k 5.2 (hemolyzed), glu 109, cr 2.58 CBC wbc 5.6, hgb 11.0, plt 123 ____________________________________________   EKG  I, Nance Pear, attending physician, personally viewed and interpreted this EKG  EKG Time: 2053 Rate: 79 Rhythm: sinus rhythm Axis: normal Intervals: qtc 635 QRS: narrow ST changes: no st elevation Impression: abnormal ekg   ____________________________________________    RADIOLOGY  MRI pending at time of sign out  ____________________________________________   PROCEDURES  Procedures  ____________________________________________   INITIAL IMPRESSION /  ASSESSMENT AND PLAN / ED COURSE  Pertinent labs & imaging results that were available during my care of the patient were reviewed by me and considered in my medical decision making (see chart for details).   Patient presented to the emergency department today after a syncopal episode.  Patient herself has no complaints.  In discussion with the family member over the phone it sounds like the patient was trending get to the bathroom when this happened.  Troponin was initially slightly elevated however repeat without any significant change.  At this time I have low suspicion for ACS.  Patient was found to have an elevation of her creatinine over her baseline.  I do think she is likely dehydrated.  Slightly hyponatremic as well.  Patient was given IV  fluids here and orthostatics were checked and did not have any concerning changes.  Patient's urine with some signs concerning for UTI however she denies any urinary tract symptoms.  Because of this will send for culture and will defer antibiotic treatment at this time.  Patient slightly anemic although this appears to be her baseline.  Will encourage good oral hydration.    After initial work-up I discussed with granddaughter on the telephone.  She had concerns for possible stroke.  She stated that the patient has been more confused today than normal although I did try to clarify whether or not the patient has been exhibiting some confusion.  Granddaughter states that has been occurring on and off over the past few months but seems to potentially be getting worse.  There is documentation of sundowning in the chart.  I did discuss the granddaughter that is very likely the patient has dementia.  Will however check MRI to make sure that we do not see any acute abnormality.  Additionally given granddaughter's concerns for possible increased confusion will go ahead and treat for potential UTI.  Discussed this with granddaughter. ____________________________________________   FINAL CLINICAL IMPRESSION(S) / ED DIAGNOSES  Final diagnoses:  Dehydration  Syncope, unspecified syncope type     Note: This dictation was prepared with Dragon dictation. Any transcriptional errors that result from this process are unintentional     Nance Pear, MD 11/21/20 [id number]

## 2020-11-21 NOTE — ED Notes (Signed)
Took over patient care assisted to the bathroom

## 2020-11-21 NOTE — ED Notes (Signed)
Pt assisted to bathroom and back

## 2020-11-21 NOTE — ED Notes (Signed)
Pt presents to ED via EMS with c/o of "being out of it" per daughter who called EMS. EMS states pt was sitting on the toilet when they arrived and did not appear to being having straining issues. Pt is A&Ox4 at this time, pt is unclear of current year but does know current president. Pt denies pain. Pt denies SOB. Pt denies fever or chills.   Pt has a HX of hyponatremia. Pt denies urinary symptoms and denies incontinence to this RN.   Pt was given '4mg'$  of IVP Zofran en route by EMS PTA and also a total of 1000 ml of NS.

## 2020-11-21 NOTE — Discharge Instructions (Addendum)
Please seek medical attention for any high fevers, chest pain, shortness of breath, change in behavior, persistent vomiting, bloody stool or any other new or concerning symptoms.  

## 2020-11-22 ENCOUNTER — Telehealth: Payer: Self-pay | Admitting: Family Medicine

## 2020-11-22 ENCOUNTER — Observation Stay (HOSPITAL_COMMUNITY)
Admit: 2020-11-22 | Discharge: 2020-11-22 | Disposition: A | Discharge status: Home / Self Care | Payer: PPO | Attending: Family Medicine | Admitting: Family Medicine

## 2020-11-22 DIAGNOSIS — N179 Acute kidney failure, unspecified: Secondary | ICD-10-CM | POA: Diagnosis not present

## 2020-11-22 DIAGNOSIS — I5032 Chronic diastolic (congestive) heart failure: Secondary | ICD-10-CM | POA: Diagnosis not present

## 2020-11-22 DIAGNOSIS — R55 Syncope and collapse: Secondary | ICD-10-CM

## 2020-11-22 DIAGNOSIS — N184 Chronic kidney disease, stage 4 (severe): Secondary | ICD-10-CM

## 2020-11-22 DIAGNOSIS — F0390 Unspecified dementia without behavioral disturbance: Secondary | ICD-10-CM

## 2020-11-22 LAB — BASIC METABOLIC PANEL
Anion gap: 6 (ref 5–15)
BUN: 48 mg/dL — ABNORMAL HIGH (ref 8–23)
CO2: 24 mmol/L (ref 22–32)
Calcium: 9 mg/dL (ref 8.9–10.3)
Chloride: 107 mmol/L (ref 98–111)
Creatinine, Ser: 2.15 mg/dL — ABNORMAL HIGH (ref 0.44–1.00)
GFR, Estimated: 21 mL/min — ABNORMAL LOW (ref >60)
Glucose, Bld: 71 mg/dL (ref 70–99)
Potassium: 4.6 mmol/L (ref 3.5–5.1)
Sodium: 137 mmol/L (ref 135–145)

## 2020-11-22 LAB — CBC
HCT: 31 % — ABNORMAL LOW (ref 36.0–46.0)
Hemoglobin: 10.6 g/dL — ABNORMAL LOW (ref 12.0–15.0)
MCH: 32.4 pg (ref 26.0–34.0)
MCHC: 34.2 g/dL (ref 30.0–36.0)
MCV: 94.8 fL (ref 80.0–100.0)
Platelets: 118 10*3/uL — ABNORMAL LOW (ref 150–400)
RBC: 3.27 MIL/uL — ABNORMAL LOW (ref 3.87–5.11)
RDW: 12.7 % (ref 11.5–15.5)
WBC: 6.4 10*3/uL (ref 4.0–10.5)
nRBC: 0 % (ref 0.0–0.2)

## 2020-11-22 LAB — ECHOCARDIOGRAM COMPLETE
AR max vel: 2.52 cm2
AV Area VTI: 3.06 cm2
AV Area mean vel: 2.82 cm2
AV Mean grad: 2 mmHg
AV Peak grad: 4.5 mmHg
Ao pk vel: 1.06 m/s
Area-P 1/2: 2.42 cm2
Height: 64 in
S' Lateral: 2.4 cm
Weight: 1950.63 oz

## 2020-11-22 LAB — GLUCOSE, CAPILLARY
Glucose-Capillary: 84 mg/dL (ref 70–99)
Glucose-Capillary: 71 mg/dL (ref 70–99)
Glucose-Capillary: 102 mg/dL — ABNORMAL HIGH (ref 70–99)
Glucose-Capillary: 111 mg/dL — ABNORMAL HIGH (ref 70–99)

## 2020-11-22 LAB — RESP PANEL BY RT-PCR (FLU A&B, COVID) ARPGX2
Influenza A by PCR: NEGATIVE
Influenza B by PCR: NEGATIVE
SARS Coronavirus 2 by RT PCR: NEGATIVE

## 2020-11-22 MED ORDER — METOPROLOL TARTRATE 25 MG PO TABS
12.5000 mg | ORAL_TABLET | Freq: Two times a day (BID) | ORAL | Status: DC
  Administered 2020-11-22 – 2020-11-25 (×8): 12.5 mg via ORAL
  Filled 2020-11-22 (×8): qty 1

## 2020-11-22 MED ORDER — CIPROFLOXACIN IN D5W 200 MG/100ML IV SOLN
200.0000 mg | INTRAVENOUS | Status: DC
Start: 2020-11-22 — End: 2020-11-22
  Filled 2020-11-22: qty 100

## 2020-11-22 MED ORDER — CIPROFLOXACIN IN D5W 400 MG/200ML IV SOLN
400.0000 mg | INTRAVENOUS | Status: DC
  Filled 2020-11-22: qty 200

## 2020-11-22 MED ORDER — CIPROFLOXACIN IN D5W 400 MG/200ML IV SOLN
400.0000 mg | INTRAVENOUS | Status: DC
Start: 2020-11-22 — End: 2020-11-25
  Administered 2020-11-22 – 2020-11-24 (×3): 400 mg via INTRAVENOUS
  Filled 2020-11-22 (×4): qty 200

## 2020-11-22 MED ORDER — SODIUM CHLORIDE 0.9% FLUSH
3.0000 mL | Freq: Two times a day (BID) | INTRAVENOUS | Status: DC
  Administered 2020-11-22 – 2020-11-25 (×8): 3 mL via INTRAVENOUS

## 2020-11-22 MED ORDER — ENOXAPARIN SODIUM 30 MG/0.3ML IJ SOSY
30.0000 mg | PREFILLED_SYRINGE | INTRAMUSCULAR | Status: DC
  Filled 2020-11-22: qty 0.3

## 2020-11-22 MED ORDER — HEPARIN SODIUM (PORCINE) 5000 UNIT/ML IJ SOLN
5000.0000 [IU] | Freq: Three times a day (TID) | INTRAMUSCULAR | Status: DC
  Administered 2020-11-22 – 2020-11-25 (×11): 5000 [IU] via SUBCUTANEOUS
  Filled 2020-11-22 (×12): qty 1

## 2020-11-22 MED ORDER — ACETAMINOPHEN 325 MG PO TABS
650.0000 mg | ORAL_TABLET | Freq: Four times a day (QID) | ORAL | Status: DC | PRN
  Administered 2020-11-25: 650 mg via ORAL
  Filled 2020-11-22: qty 2

## 2020-11-22 MED ORDER — LACTATED RINGERS IV SOLN: INTRAVENOUS | Status: AC

## 2020-11-22 NOTE — Progress Notes (Signed)
PROGRESS NOTE    [name]  [id number] DOB: Feb 06, 1925 DOA: 11/21/2020 PCP: Margo Common, PA-C  Assessment & Plan:   Principal Problem:   Syncope Active Problems:   Chronic diastolic CHF (congestive heart failure) (HCC)   AKI (acute kidney injury) ([place])   Syncope: etiology unclear. Echo ordered. Orthostatic vitals were normal. Continue on tele    AKI on CKDIIIa: baseline Cr likely around 1.2. Cr is trending down from day prior. Continue to hold ACE-I   Hyperkalemia: resolved    UTI: urine cx is pending. Continue on cipro    Chronic diastolic CHF: continue on metoprolol. Hold home dose of torsemide secondary AKI on CKD  Thrombocytopenia: etiology unclear. Will continue to monitor     DVT prophylaxis: heparin  Code Status: full  Family Communication: called pt's granddaughter, [name], but no answer and unable to leave a voicemail  Disposition Plan: depends on PT/OT recs .  Level of care: Med-Surg  Status is: Observation  The patient remains OBS appropriate and will d/c before 2 midnights.  Dispo: The patient is from: Home              Anticipated d/c is to: Home              Patient currently is not medically stable to d/c.   Difficult to place patient : unclear    Consultants:    Procedures:   Antimicrobials:    Subjective: Pt c/o fatigue   Objective: Vitals:   11/22/20 0330 11/22/20 0424 11/22/20 0740 11/22/20 1114  BP: 128/67 (!) 133/56 110/60 (!) 111/53  Pulse: 76 70 (!) 58 (!) 57  Resp: '13 18 18 16  '$ Temp:  (!) 97.5 F (36.4 C) 98.5 F (36.9 C) (!) 97.5 F (36.4 C)  TempSrc:  Axillary    SpO2: 99% 97% 100% 100%  Weight:      Height:        Intake/Output Summary (Last 24 hours) at 11/22/2020 1401 Last data filed at 11/22/2020 1005 Gross per 24 hour  Intake 195.84 ml  Output --  Net 195.84 ml   Filed Weights   11/21/20 1453  Weight: 55.3 kg    Examination:  General exam: Appears calm and comfortable  Respiratory  system: Clear to auscultation. Respiratory effort normal. Cardiovascular system: S1 & S2 +. No , rubs, gallops or clicks. No pedal edema. Gastrointestinal system: Abdomen is nondistended, soft and nontender. Normal bowel sounds heard. Central nervous system: Alert and oriented. Moves all extremities  Psychiatry: Judgement and insight appear normal. Flat mood and affect.     Data Reviewed: I have personally reviewed following labs and imaging studies  CBC: Recent Labs  Lab 11/21/20 1503 11/22/20 0755  WBC 5.6 6.4  NEUTROABS 4.0  --   HGB 11.0* 10.6*  HCT 33.7* 31.0*  MCV 97.7 94.8  PLT 123* 123456*   Basic Metabolic Panel: Recent Labs  Lab 11/21/20 1503 11/22/20 0755  NA 133* 137  K 5.2* 4.6  CL 104 107  CO2 20* 24  GLUCOSE 109* 71  BUN 60* 48*  CREATININE 2.58* 2.15*  CALCIUM 9.0 9.0   GFR: Estimated Creatinine Clearance: 14.1 mL/min (A) (by C-G formula based on SCr of 2.15 mg/dL (H)). Liver Function Tests: No results for input(s): AST, ALT, ALKPHOS, BILITOT, PROT, ALBUMIN in the last 168 hours. No results for input(s): LIPASE, AMYLASE in the last 168 hours. No results for input(s): AMMONIA in the last 168 hours. Coagulation Profile: No results  for input(s): INR, PROTIME in the last 168 hours. Cardiac Enzymes: No results for input(s): CKTOTAL, CKMB, CKMBINDEX, TROPONINI in the last 168 hours. BNP (last 3 results) No results for input(s): PROBNP in the last 8760 hours. HbA1C: No results for input(s): HGBA1C in the last 72 hours. CBG: Recent Labs  Lab 11/22/20 0526 11/22/20 0743  GLUCAP 84 71   Lipid Profile: No results for input(s): CHOL, HDL, LDLCALC, TRIG, CHOLHDL, LDLDIRECT in the last 72 hours. Thyroid Function Tests: No results for input(s): TSH, T4TOTAL, FREET4, T3FREE, THYROIDAB in the last 72 hours. Anemia Panel: No results for input(s): VITAMINB12, FOLATE, FERRITIN, TIBC, IRON, RETICCTPCT in the last 72 hours. Sepsis Labs: No results for input(s):  PROCALCITON, LATICACIDVEN in the last 168 hours.  Recent Results (from the past 240 hour(s))  Resp Panel by RT-PCR (Flu A&B, Covid) Nasopharyngeal Swab     Status: None   Collection Time: 11/22/20  1:13 AM   Specimen: Nasopharyngeal Swab; Nasopharyngeal(NP) swabs in vial transport medium  Result Value Ref Range Status   SARS Coronavirus 2 by RT PCR NEGATIVE NEGATIVE Final    Comment: (NOTE) SARS-CoV-2 target nucleic acids are NOT DETECTED.  The SARS-CoV-2 RNA is generally detectable in upper respiratory specimens during the acute phase of infection. The lowest concentration of SARS-CoV-2 viral copies this assay can detect is 138 copies/mL. A negative result does not preclude SARS-Cov-2 infection and should not be used as the sole basis for treatment or other patient management decisions. A negative result may occur with  improper specimen collection/handling, submission of specimen other than nasopharyngeal swab, presence of viral mutation(s) within the areas targeted by this assay, and inadequate number of viral copies(<138 copies/mL). A negative result must be combined with clinical observations, patient history, and epidemiological information. The expected result is Negative.  Fact Sheet for Patients:  EntrepreneurPulse.com.au  Fact Sheet for Healthcare Providers:  IncredibleEmployment.be  This test is no t yet approved or cleared by the [place] FDA and  has been authorized for detection and/or diagnosis of SARS-CoV-2 by FDA under an Emergency Use Authorization (EUA). This EUA will remain  in effect (meaning this test can be used) for the duration of the COVID-19 declaration under Section 564(b)(1) of the Act, 21 U.S.C.section 360bbb-3(b)(1), unless the authorization is terminated  or revoked sooner.       Influenza A by PCR NEGATIVE NEGATIVE Final   Influenza B by PCR NEGATIVE NEGATIVE Final    Comment: (NOTE) The Xpert Xpress  SARS-CoV-2/FLU/RSV plus assay is intended as an aid in the diagnosis of influenza from Nasopharyngeal swab specimens and should not be used as a sole basis for treatment. Nasal washings and aspirates are unacceptable for Xpert Xpress SARS-CoV-2/FLU/RSV testing.  Fact Sheet for Patients: EntrepreneurPulse.com.au  Fact Sheet for Healthcare Providers: IncredibleEmployment.be  This test is not yet approved or cleared by the [place] FDA and has been authorized for detection and/or diagnosis of SARS-CoV-2 by FDA under an Emergency Use Authorization (EUA). This EUA will remain in effect (meaning this test can be used) for the duration of the COVID-19 declaration under Section 564(b)(1) of the Act, 21 U.S.C. section 360bbb-3(b)(1), unless the authorization is terminated or revoked.  Performed at [facility], [address] [place], [place] [address]          Radiology Studies: MR BRAIN WO CONTRAST  Result Date: 11/22/2020 CLINICAL DATA:  Initial evaluation for acute confusion. EXAM: MRI HEAD WITHOUT CONTRAST TECHNIQUE: Multiplanar, multiecho pulse sequences of the  brain and surrounding structures were obtained without intravenous contrast. COMPARISON:  CT from 07/21/2020. FINDINGS: Brain: Examination moderately degraded by motion artifact. Diffuse prominence of the CSF containing spaces compatible generalized age-related cerebral atrophy. Patchy and confluent T2/FLAIR hyperintensity involving the periventricular deep white matter both cerebral hemispheres most consistent with chronic small vessel ischemic disease, moderate in nature. Few small remote lacunar infarcts noted about the basal ganglia and thalami. Probable small remote right cerebellar infarct noted. No abnormal foci of restricted diffusion to suggest acute or subacute ischemia. Gray-white matter differentiation otherwise maintained. No other areas of remote cortical infarction.  No foci of susceptibility artifact to suggest acute or chronic intracranial hemorrhage. No mass lesion, midline shift or mass effect. Mild ventricular prominence related to global parenchymal volume loss without hydrocephalus. No extra-axial fluid collection. Vascular: Major intracranial vascular flow voids are grossly maintained at the skull base. Skull and upper cervical spine: Craniocervical junction grossly within normal limits. Bone marrow signal intensity normal. No scalp soft tissue abnormality. Sinuses/Orbits: Prior bilateral ocular lens replacement. Globes and orbital soft tissues demonstrate no acute finding. Sequelae of prior sinus surgery noted. Underlying mild chronic mucosal thickening noted about the ethmoidal air cells and right maxillary sinus. Small bilateral mastoid effusions noted, of doubtful significance. Visualized nasopharynx unremarkable. Inner ear structures grossly normal. Other: None. IMPRESSION: 1. Motion degraded exam. No definite acute intracranial abnormality. 2. Age-related cerebral atrophy with moderate chronic small vessel ischemic disease. Electronically Signed   By: Jeannine Boga M.D.   On: 11/22/2020 00:14        Scheduled Meds:  heparin injection (subcutaneous)  5,000 Units Subcutaneous Q8H   metoprolol tartrate  12.5 mg Oral BID   sodium chloride flush  3 mL Intravenous Q12H   Continuous Infusions:  ciprofloxacin       LOS: 0 days    Time spent: 32 mins     Wyvonnia Dusky, MD [facility] Pager [phone]  If 7PM-7AM, please contact night-coverage 11/22/2020, 2:01 PM

## 2020-11-22 NOTE — ED Provider Notes (Signed)
-----------------------------------------   12:30 AM on 11/22/2020 -----------------------------------------   MRI interpreted per Dr. Jeannine Boga:  1. Motion degraded exam. No definite acute intracranial abnormality.  2. Age-related cerebral atrophy with moderate chronic small vessel  ischemic disease.   Updated patient on MRI results. Appears confused. Given syncopal episode, AKI, UTI and confusion, will discuss with hospitalist services for admission.  Did try to call patient's granddaughter [name] at [phone] without answer and was unable to leave a message due to the mailbox being full.   Paulette Blanch, MD 11/22/20 [phone]

## 2020-11-22 NOTE — Progress Notes (Signed)
*  PRELIMINARY RESULTS* Echocardiogram 2D Echocardiogram has been performed.  [name] 11/22/2020, 2:55 PM

## 2020-11-22 NOTE — Telephone Encounter (Signed)
Pt granddaughter, [name] is calling to request a referral to neuro for testing for Dementia.Myra states that the pt was tested for dementia at an ov but it went no further previously. Pt is in the hospital and is showing bad signs of dementia - pt has completely alternative reality. Tells the same stories over and over, having hallucinations (since 3/22),   CB- [phone]

## 2020-11-22 NOTE — Progress Notes (Signed)
PHARMACIST - PHYSICIAN COMMUNICATION  CONCERNING:  Enoxaparin (Lovenox) for DVT Prophylaxis    RECOMMENDATION: Patient was prescribed enoxaprin '40mg'$  q24 hours for VTE prophylaxis.   Filed Weights   11/21/20 1453  Weight: 55.3 kg (121 lb 14.6 oz)    Body mass index is 20.93 kg/m.  Estimated Creatinine Clearance: 11.8 mL/min (A) (by C-G formula based on SCr of 2.58 mg/dL (H)).  Patient is candidate for enoxaparin '30mg'$  every 24 hours based on CrCl <64m/min or Weight <45kg  DESCRIPTION: [facility]  Patient is now receiving enoxaparin 30 mg every 24 hours   NRenda Rolls PharmD, [facility]8/03/2021 1:41 AM

## 2020-11-22 NOTE — Progress Notes (Signed)
Pharmacy Antibiotic Note  [name] is a 87 y.o. female admitted on 11/21/2020 with UTI.  Pharmacy has been consulted for Cipro dosing.  Plan: Ordered Cipro IV 400 mg q24h X 7 day per indication and renal function.  Pharmacy will continue to follow and adjust dose whenever warranted.   Height: '5\' 4"'$  (162.6 cm) Weight: 55.3 kg (121 lb 14.6 oz) IBW/kg (Calculated) : 54.7  Temp (24hrs), Avg:97.5 F (36.4 C), Min:97.5 F (36.4 C), Max:97.5 F (36.4 C)  Recent Labs  Lab 11/21/20 1503  WBC 5.6  CREATININE 2.58*    Estimated Creatinine Clearance: 11.8 mL/min (A) (by C-G formula based on SCr of 2.58 mg/dL (H)).    Allergies  Allergen Reactions   Accolate [Zafirlukast] Other (See Comments)    Reaction: unknown   Bactrim [Sulfamethoxazole-Trimethoprim] Nausea And Vomiting   Metronidazole Other (See Comments)    Reaction: unknown   Nitrofurantoin Monohyd Macro Other (See Comments)    GI upset   Penicillins Diarrhea and Other (See Comments)    Has patient had a PCN reaction causing immediate rash, facial/tongue/throat swelling, SOB or lightheadedness with hypotension: Unknown Has patient had a PCN reaction causing severe rash involving mucus membranes or skin necrosis: Unknown Has patient had a PCN reaction that required hospitalization: Unknown Has patient had a PCN reaction occurring within the last 10 years: Unknown If all of the above answers are "NO", then may proceed with Cephalosporin use.   Keflex [Cephalexin] Rash    Antimicrobials this admission: 08/11 Cipro >>   Microbiology results: 8/11 UCx: Pending   Thank you for allowing pharmacy to be a part of this patient's care.  Renda Rolls, PharmD, [facility] 11/22/2020 2:24 AM

## 2020-11-22 NOTE — Evaluation (Signed)
Occupational Therapy Evaluation Patient Details Name: [name] MRN: [id number] DOB: Jan 01, 1925 Today's Date: 11/22/2020    History of Present Illness 87 y.o. female with medical history significant of HTN, dCHF, CKD-IIIa, depression with anxiety, diverticulitis, memory loss, and hx of fall resulting in displaced superior pubic ramus and ischium fracture ~101moago presented to ED with syncopal episode.   Clinical Impression   Pt was seen for OT evaluation this date. Pt alert, pleasant, and agreeable to OT. No family present to help verify PLOF/home set up, however, pt's account matches closely with previous hospitalization/chart review. Prior to hospital admission, pt was ambulating with a 2WW, used a TTB for bathing, and reports indep with dressing and toileting. Pt reports she manages light meal prep and now has an electronic medication mgt tool that dispenses meds at the right time which her granddaughter helps to set up/fill. Pt lives alone and her granddaughter and another "lady" (unsure who) come help out. Granddaughter provides transportation, groceries, and med mgt. No one is with her 24/7. Pt shares with OT that her son recently passed away (~123mogo) and becomes tearful intermittently during session when speaking about him and what she has gone through during her life. Active listening and emotional support provided. Offered to coordinate with RN for a chaplain consult and pt very appreciative.   Currently pt demonstrates impairments in strength, activity tolerance, cognition, and balance as described below (See OT problem list) which functionally limit her ability to perform ADL/self-care tasks at baseline. Pt currently requires supervision for bed mobility and seated grooming tasks, CGA for ADL transfers + RW and for standing clothing mgt and hygiene after toileting using BSC. Pt able to negotiate short distance in room (<5') with RW and CGA. Pt denied dizziness/lightheadedness  throughout. Pt would benefit from skilled OT services to address noted impairments and functional limitations (see below for any additional details) in order to maximize safety and independence while minimizing falls risk and caregiver burden. Anticipate pt will functionally manage safer in home environment given hx of cognitive deficits. Upon hospital discharge, recommend H[place] 24/7 supervision/assist at least initially to maximize pt safety and return to functional independence during meaningful occupations of daily life.     Follow Up Recommendations  Home health OT;Supervision/Assistance - 24 hour    Equipment Recommendations  None recommended by OT    Recommendations for Other Services       Precautions / Restrictions Precautions Precautions: Fall Restrictions Weight Bearing Restrictions: No      Mobility Bed Mobility Overal bed mobility: Needs Assistance Bed Mobility: Supine to Sit;Sit to Supine     Supine to sit: Supervision;HOB elevated Sit to supine: Supervision   General bed mobility comments: increased time/effort but no assist    Transfers Overall transfer level: Needs assistance Equipment used: Rolling walker (2 wheeled) Transfers: Sit to/from Stand Sit to Stand: Min guard         General transfer comment: VC to scoot forward prior to standing    Balance Overall balance assessment: Needs assistance Sitting-balance support: No upper extremity supported;Feet supported;Single extremity supported Sitting balance-Leahy Scale: Good     Standing balance support: No upper extremity supported;During functional activity Standing balance-Leahy Scale: Fair Standing balance comment: able to stand without UE support on RW to manage brief and underwear (wears both for support) over hips during toileting, no LOB  ADL either performed or assessed with clinical judgement   ADL Overall ADL's : Needs assistance/impaired      Grooming: Sitting;Brushing hair;Set up;Supervision/safety                                 General ADL Comments: Pt required CGA + RW for ADL transfers, CGA in standing for hygiene after toileting, PRN MIN A for LB dressing     Vision         Perception     Praxis      Pertinent Vitals/Pain Pain Assessment: No/denies pain     Hand Dominance Right   Extremity/Trunk Assessment Upper Extremity Assessment Upper Extremity Assessment: Generalized weakness   Lower Extremity Assessment Lower Extremity Assessment: Generalized weakness       Communication Communication Communication: HOH   Cognition Arousal/Alertness: Awake/alert Behavior During Therapy:  (tearful at times when talking about her son who passed ~70moago, happy at other times) Overall Cognitive Status: No family/caregiver present to determine baseline cognitive functioning                                 General Comments: Pt alert, oriented to self, generally to situation, and place; follows commands with PRN VC, some confusion noted, does not recall syncopal episode and belives her granddaughter dropped her off here and never came back   General Comments       Exercises     Shoulder Instructions      Home Living Family/patient expects to be discharged to:: Private residence Living Arrangements: Alone Available Help at Discharge: Family;Available PRN/intermittently (pt reports granddaughter helps some with transportation and groceries, reports another lady stays with her some when not working (unclear who this is)) Type of Home: House Home Access: [place] Two level;Able to live on main level with bedroom/bathroom     Bathroom Shower/Tub: Tub/shower unit   Bathroom Toilet: Handicapped height     Home Equipment: WEnvironmental consultant- 2 wheels;Tub bench;Adaptive equipment Adaptive Equipment: Reacher Additional Comments: No family present, pt is questionable  historian      Prior Functioning/Environment Level of Independence: Needs assistance  Gait / Transfers Assistance Needed: Pt reports using 2WW all the time ADL's / Homemaking Assistance Needed: Pt reports indep with dressing, uses tub transfer bench for bathing, and able to do light meal prep and use an electronic med mgt system that dispenses meds so she doesn't forget. Granddaughter sets up meds, provides transportation, and brings pt groceries   Comments: Pt denies falls, does not recall "passing out on the toilet"        OT Problem List: Decreased strength;Decreased cognition;Decreased safety awareness;Decreased activity tolerance;Impaired balance (sitting and/or standing)      OT Treatment/Interventions: Self-care/ADL training;Therapeutic exercise;Therapeutic activities;Cognitive remediation/compensation;Energy conservation;DME and/or AE instruction;Patient/family education;Balance training    OT Goals(Current goals can be found in the care plan section) Acute Rehab OT Goals Patient Stated Goal: to go home OT Goal Formulation: With patient Time For Goal Achievement: 12/06/20 Potential to Achieve Goals: Good ADL Goals Pt Will Perform Lower Body Dressing: sit to/from stand;with supervision Pt Will Transfer to Toilet: ambulating;bedside commode;with supervision (LRAD PRN) Additional ADL Goal #1: Pt will perform morning ADL routine with set up and supervision, AE/DME PRN.  OT Frequency: Min 2X/week   Barriers to D/C:  Co-evaluation              AM-PAC OT "6 Clicks" Daily Activity     Outcome Measure Help from another person eating meals?: None Help from another person taking care of personal grooming?: A Little Help from another person toileting, which includes using toliet, bedpan, or urinal?: A Little Help from another person bathing (including washing, rinsing, drying)?: A Little Help from another person to put on and taking off regular upper body  clothing?: None Help from another person to put on and taking off regular lower body clothing?: A Little 6 Click Score: 20   End of Session Equipment Utilized During Treatment: Rolling walker Nurse Communication: Other (comment) (chaplain request)  Activity Tolerance: Patient tolerated treatment well Patient left: in bed;with bed alarm set;with call bell/phone within reach  OT Visit Diagnosis: Other abnormalities of gait and mobility (R26.89);History of falling (Z91.81);Muscle weakness (generalized) (M62.81)                Time: [id number] OT Time Calculation (min): 39 min Charges:  OT General Charges $OT Visit: 1 Visit OT Evaluation $OT Eval Moderate Complexity: 1 Mod OT Treatments $Self Care/Home Management : 23-37 mins  Hanley Hays, MPH, MS, OTR/L ascom [phone] 11/22/20, 10:21 AM

## 2020-11-22 NOTE — H&P (Addendum)
History and Physical    [name] [id number] DOB: March 29, 1925 DOA: 11/21/2020  PCP: Margo Common, PA-C  Patient coming from: Home  I have personally briefly reviewed patient's old medical records in [place]  Chief Complaint: syncope  HPI: [name] is a 87 y.o. female with medical history significant for Cognitive impairment, chronic diastolic heart failure, hypertension, CKD stage IIIa, cluster headache, and depression who presents with concerns of syncopal episode.  Pt was a poor historian.  She reports that she came in because of fall where she just slipped out of a chair but reportedly per ED documentation patient was brought in for a syncopal episode.  She was using the bathroom when family noticed that she appeared pale and diaphoretic.  She then went limp on the toilet and was unresponsive for roughly 5 minutes.  She was found by EMS on toilet. Patient reportedly complained of some abdominal pain and nausea over the past couple days. She denies any pain.  No nausea vomiting or diarrhea.  Felt she has been urinating more than usual. Denies tobacco, alcohol illicit drug use  In the ED, she was afebrile normotensive on room air.  CBC without leukocytosis.  Hemoglobin of 11.  Platelet of 123.  Sodium of 133.  K of 5.2.  Creatinine elevated to 2.58 from prior of 1.5.  CO2 20.  No anion gap.  BG of 109. Troponin of 28 and 31 UA shows small leukocyte and negative nitrite.  Treatment for UTI was initially deferred by EDP but per documentation after further discussion with granddaughter for increased confusion IV Cipro was given.  MRI was also pursued due to family's concern of strokes which has resulted negative.   Review of Systems: Unable to obtain fully due to patient being poor historian  Past Medical History:  Diagnosis Date   Breast cyst    removed   Diverticulitis    Gallstones    Hypertension    Low sodium levels     Past Surgical History:   Procedure Laterality Date   ABDOMINAL HYSTERECTOMY  2000   APPENDECTOMY  1940's   bladder tack  1990's   BREAST CYST INCISION AND DRAINAGE Left 1990's   CHOLECYSTECTOMY  09/11/13   Cholangiogram suggested a retained stone ductal dilatation.   ERCP W/ SPHICTEROTOMY  09/12/2013   Recurrent stone identified post cholecystectomy.   NASAL SINUS SURGERY       reports that she has never smoked. She has never used smokeless tobacco. She reports that she does not drink alcohol and does not use drugs. Social History  Allergies  Allergen Reactions   Accolate [Zafirlukast] Other (See Comments)    Reaction: unknown   Bactrim [Sulfamethoxazole-Trimethoprim] Nausea And Vomiting   Metronidazole Other (See Comments)    Reaction: unknown   Nitrofurantoin Monohyd Macro Other (See Comments)    GI upset   Penicillins Diarrhea and Other (See Comments)    Has patient had a PCN reaction causing immediate rash, facial/tongue/throat swelling, SOB or lightheadedness with hypotension: Unknown Has patient had a PCN reaction causing severe rash involving mucus membranes or skin necrosis: Unknown Has patient had a PCN reaction that required hospitalization: Unknown Has patient had a PCN reaction occurring within the last 10 years: Unknown If all of the above answers are "NO", then may proceed with Cephalosporin use.   Keflex [Cephalexin] Rash    Family History  Problem Relation Age of Onset   Hypertension Father    CAD Father  Cancer Brother        unknown type     Prior to Admission medications   Medication Sig Start Date End Date Taking? Authorizing Provider  acetaminophen (TYLENOL) 325 MG tablet Take 2 tablets (650 mg total) by mouth every 6 (six) hours as needed for mild pain (or Fever >/= 101). 07/23/15  Yes Gouru, Illene Silver, MD  doxycycline (VIBRAMYCIN) 100 MG capsule Take 100 mg by mouth 2 (two) times daily. 10/25/20  Yes [provider]  enalapril (VASOTEC) 10 MG tablet Take 1 tablet (10  mg total) by mouth daily. 09/30/20  Yes Chrismon, Vickki Muff, PA-C  metoprolol tartrate (LOPRESSOR) 25 MG tablet Take 0.5 tablets (12.5 mg total) by mouth 2 (two) times daily. 08/06/20  Yes Chrismon, Vickki Muff, PA-C  torsemide (DEMADEX) 20 MG tablet Take 20 mg by mouth every morning. 09/30/17  Yes [provider]  traZODone (DESYREL) 50 MG tablet Take 1/2 to 1 tablet by mouth at bedtime for sleep. 10/03/20  Yes ChrismonVickki Muff, PA-C    Physical Exam: Vitals:   11/21/20 2345 11/22/20 0000 11/22/20 0030 11/22/20 0100  BP: 131/67 120/65 (!) 120/51 (!) 128/59  Pulse: 81 73 74 72  Resp: 15 15 (!) 21 14  Temp:      TempSrc:      SpO2: 100% 99% 97% 98%  Weight:      Height:        Constitutional: NAD, calm, comfortable, thin elderly female laying flat in bed Vitals:   11/21/20 2345 11/22/20 0000 11/22/20 0030 11/22/20 0100  BP: 131/67 120/65 (!) 120/51 (!) 128/59  Pulse: 81 73 74 72  Resp: 15 15 (!) 21 14  Temp:      TempSrc:      SpO2: 100% 99% 97% 98%  Weight:      Height:       Eyes: PERRL, lids and conjunctivae normal ENMT: Mucous membranes are moist.  Neck: normal, supple Respiratory: clear to auscultation bilaterally, no wheezing, no crackles. Normal respiratory effort. No accessory muscle use.  Cardiovascular: Regular rate and rhythm, no murmurs / rubs / gallops. No extremity edema.  Abdomen: no tenderness, no masses palpated.  Bowel sounds positive.  Musculoskeletal: no clubbing / cyanosis. No joint deformity upper and lower extremities. Good ROM, no contractures. Normal muscle tone.  Skin: no rashes, lesions, ulcers. No induration Neurologic: CN 2-12 grossly intact. Sensation intact, Strength 5/5 in all 4.  Psychiatric:  Alert and oriented to self and place.  Frustrated mood.    Labs on Admission: I have personally reviewed following labs and imaging studies  CBC: Recent Labs  Lab 11/21/20 1503  WBC 5.6  NEUTROABS 4.0  HGB 11.0*  HCT 33.7*  MCV 97.7  PLT  [id number]   Basic Metabolic Panel: Recent Labs  Lab 11/21/20 1503  NA 133*  K 5.2*  CL 104  CO2 20*  GLUCOSE 109*  BUN 60*  CREATININE 2.58*  CALCIUM 9.0   GFR: Estimated Creatinine Clearance: 11.8 mL/min (A) (by C-G formula based on SCr of 2.58 mg/dL (H)). Liver Function Tests: No results for input(s): AST, ALT, ALKPHOS, BILITOT, PROT, ALBUMIN in the last 168 hours. No results for input(s): LIPASE, AMYLASE in the last 168 hours. No results for input(s): AMMONIA in the last 168 hours. Coagulation Profile: No results for input(s): INR, PROTIME in the last 168 hours. Cardiac Enzymes: No results for input(s): CKTOTAL, CKMB, CKMBINDEX, TROPONINI in the last 168 hours. BNP (last 3 results) No results  for input(s): PROBNP in the last 8760 hours. HbA1C: No results for input(s): HGBA1C in the last 72 hours. CBG: No results for input(s): GLUCAP in the last 168 hours. Lipid Profile: No results for input(s): CHOL, HDL, LDLCALC, TRIG, CHOLHDL, LDLDIRECT in the last 72 hours. Thyroid Function Tests: No results for input(s): TSH, T4TOTAL, FREET4, T3FREE, THYROIDAB in the last 72 hours. Anemia Panel: No results for input(s): VITAMINB12, FOLATE, FERRITIN, TIBC, IRON, RETICCTPCT in the last 72 hours. Urine analysis:    Component Value Date/Time   COLORURINE STRAW (A) 11/21/2020 1533   APPEARANCEUR CLEAR (A) 11/21/2020 1533   APPEARANCEUR Clear 08/27/2017 1132   LABSPEC 1.010 11/21/2020 1533   LABSPEC 1.014 08/09/2013 2100   PHURINE 5.0 11/21/2020 1533   GLUCOSEU NEGATIVE 11/21/2020 1533   GLUCOSEU Negative 08/09/2013 2100   HGBUR NEGATIVE 11/21/2020 1533   BILIRUBINUR NEGATIVE 11/21/2020 1533   BILIRUBINUR Negative 08/27/2017 1132   BILIRUBINUR Negative 08/09/2013 2100   KETONESUR NEGATIVE 11/21/2020 1533   PROTEINUR NEGATIVE 11/21/2020 1533   UROBILINOGEN 0.2 08/16/2017 1439   NITRITE NEGATIVE 11/21/2020 1533   LEUKOCYTESUR SMALL (A) 11/21/2020 1533   LEUKOCYTESUR Negative  08/09/2013 2100    Radiological Exams on Admission: MR BRAIN WO CONTRAST  Result Date: 11/22/2020 CLINICAL DATA:  Initial evaluation for acute confusion. EXAM: MRI HEAD WITHOUT CONTRAST TECHNIQUE: Multiplanar, multiecho pulse sequences of the brain and surrounding structures were obtained without intravenous contrast. COMPARISON:  CT from 07/21/2020. FINDINGS: Brain: Examination moderately degraded by motion artifact. Diffuse prominence of the CSF containing spaces compatible generalized age-related cerebral atrophy. Patchy and confluent T2/FLAIR hyperintensity involving the periventricular deep white matter both cerebral hemispheres most consistent with chronic small vessel ischemic disease, moderate in nature. Few small remote lacunar infarcts noted about the basal ganglia and thalami. Probable small remote right cerebellar infarct noted. No abnormal foci of restricted diffusion to suggest acute or subacute ischemia. Gray-white matter differentiation otherwise maintained. No other areas of remote cortical infarction. No foci of susceptibility artifact to suggest acute or chronic intracranial hemorrhage. No mass lesion, midline shift or mass effect. Mild ventricular prominence related to global parenchymal volume loss without hydrocephalus. No extra-axial fluid collection. Vascular: Major intracranial vascular flow voids are grossly maintained at the skull base. Skull and upper cervical spine: Craniocervical junction grossly within normal limits. Bone marrow signal intensity normal. No scalp soft tissue abnormality. Sinuses/Orbits: Prior bilateral ocular lens replacement. Globes and orbital soft tissues demonstrate no acute finding. Sequelae of prior sinus surgery noted. Underlying mild chronic mucosal thickening noted about the ethmoidal air cells and right maxillary sinus. Small bilateral mastoid effusions noted, of doubtful significance. Visualized nasopharynx unremarkable. Inner ear structures grossly  normal. Other: None. IMPRESSION: 1. Motion degraded exam. No definite acute intracranial abnormality. 2. Age-related cerebral atrophy with moderate chronic small vessel ischemic disease. Electronically Signed   By: Jeannine Boga M.D.   On: 11/22/2020 00:14      Assessment/Plan  Syncope  -Orthostatic vital signs was normal but still concerned she had a vasovagal episode while using the bathroom given her AKI she likely is still volume depleted -Keep on continuous telemetry -Obtain echocardiogram  AKI on CKD 3a/non-anion gap metabolic acidosis -Creatinine of 2.58 from prior 1.2 -Gentle IV continuous fluid hydration overnight -Hold ACE  Mild hyperkalemia - will monitor with repeat labs in the morning following IV fluids  Possible UTI -UA with small leukocyte and negative nitrite but she does reports increased urinary frequency -Will continue IV Cipro awaiting urine culture  Chronic CHF -Continue metoprolol but hold home torsemide  Level of care: Med-Surg  Status is: Observation  The patient remains OBS appropriate and will d/c before 2 midnights.  Dispo: The patient is from: Home              Anticipated d/c is to: Home              Patient currently is not medically stable to d/c.   Difficult to place patient No         Orene Desanctis DO [facility]   If 7PM-7AM, please contact night-coverage [email or web address]   11/22/2020, 1:38 AM

## 2020-11-22 NOTE — Evaluation (Signed)
Physical Therapy Evaluation Patient Details Name: [name] MRN: [id number] DOB: [birth year] Today's Date: 11/22/2020   History of Present Illness  Pt is a 87 y/o F admitted on 11/21/20 after presenting with c/c of syncopal episode. PMH: cognitive impairment, chronic diastolic heart failure, HTN, CKD stage 3A, cluster HA, depression  Clinical Impression  Pt seen for PT evaluation with pt restless, reporting "something's happening" & pt found to have incontinent BM. Pt is able to complete bed mobility with supervision & ambulate in room to bathroom with RW & supervision. PT assists with changing into clean gown & doffing soiled brief. Pt required extra time on toilet but continues to have liquid BM so pt left in handoff to NT to allow extra time to finish toileting.  Pt is a poor historian and home set up & PLOF information obtained from chart & some from pt. Pt will require 24 hr supervision upon d/c with recommendations of f/u HHPT.    Follow Up Recommendations Home health PT;Supervision/Assistance - 24 hour    Equipment Recommendations  Rolling walker with 5" wheels    Recommendations for Other Services       Precautions / Restrictions Precautions Precautions: Fall Restrictions Weight Bearing Restrictions: No      Mobility  Bed Mobility Overal bed mobility: Needs Assistance Bed Mobility: Supine to Sit;Sit to Supine     Supine to sit: Supervision;HOB elevated Sit to supine: Supervision   General bed mobility comments: increased time/effort but no assist    Transfers Overall transfer level: Needs assistance Equipment used: Rolling walker (2 wheeled) Transfers: Sit to/from Stand Sit to Stand: Supervision         General transfer comment: VC to scoot forward prior to standing  Ambulation/Gait Ambulation/Gait assistance: Supervision Gait Distance (Feet): 22 Feet Assistive device: Rolling walker (2 wheeled)   Gait velocity: decreased      Stairs             Wheelchair Mobility    Modified Rankin (Stroke Patients Only)       Balance Overall balance assessment: Needs assistance Sitting-balance support: No upper extremity supported;Feet supported;Single extremity supported Sitting balance-Leahy Scale: Good     Standing balance support: No upper extremity supported;During functional activity Standing balance-Leahy Scale: Fair Standing balance comment: able to stand without UE support on RW to manage brief and underwear (wears both for support) over hips during toileting, no LOB                             Pertinent Vitals/Pain Pain Assessment: No/denies pain    Home Living Family/patient expects to be discharged to:: Private residence Living Arrangements: Alone Available Help at Discharge: Family;Available PRN/intermittently (pt reports her son passed away 1 month ago & has had a "lady" stay with her since but unable to elaborate) Type of Home: House Home Access: Ramped entrance     Home Layout: Two level;Able to live on main level with bedroom/bathroom Home Equipment: Gilford Rile - 2 wheels;Tub bench Additional Comments: No family present, pt is questionable historian    Prior Function Level of Independence: Needs assistance   Gait / Transfers Assistance Needed: RW for gait (per chart)  ADL's / Homemaking Assistance Needed: Pt reports indep with dressing, uses tub transfer bench for bathing, and able to do light meal prep and use an electronic med mgt system that dispenses meds so she doesn't forget. Granddaughter sets up meds, provides transportation, and brings pt  groceries  Comments: Pt denies falls, does not recall "passing out on the toilet"     Hand Dominance   Dominant Hand: Right    Extremity/Trunk Assessment   Upper Extremity Assessment Upper Extremity Assessment: Generalized weakness    Lower Extremity Assessment Lower Extremity Assessment: Generalized weakness       Communication    Communication: HOH  Cognition Arousal/Alertness: Awake/alert Behavior During Therapy:  (tearful at times when talking about her son who passed ~49moago, happy at other times) Overall Cognitive Status: No family/caregiver present to determine baseline cognitive functioning                                 General Comments: Oriented to self & location but upon PT arrival is restlessly asking for help because "something is happening" when pt found to be having incontinent BM.      General Comments      Exercises     Assessment/Plan    PT Assessment Patient needs continued PT services  PT Problem List Decreased strength;Decreased mobility;Decreased safety awareness;Decreased activity tolerance;Decreased cognition;Decreased balance       PT Treatment Interventions DME instruction;Therapeutic activities;Gait training;Therapeutic exercise;Patient/family education;Balance training;Neuromuscular re-education;Functional mobility training    PT Goals (Current goals can be found in the Care Plan section)  Acute Rehab PT Goals Patient Stated Goal: to go home PT Goal Formulation: With patient Time For Goal Achievement: 12/06/20 Potential to Achieve Goals: Fair    Frequency Min 2X/week   Barriers to discharge Decreased caregiver support unsure of assistance at d/c    Co-evaluation               AM-PAC PT "6 Clicks" Mobility  Outcome Measure Help needed turning from your back to your side while in a flat bed without using bedrails?: None Help needed moving from lying on your back to sitting on the side of a flat bed without using bedrails?: A Little Help needed moving to and from a bed to a chair (including a wheelchair)?: A Little Help needed standing up from a chair using your arms (e.g., wheelchair or bedside chair)?: A Little Help needed to walk in hospital room?: A Little Help needed climbing 3-5 steps with a railing? : A Little 6 Click Score: 19    End of  Session   Activity Tolerance: Patient tolerated treatment well Patient left:  (in bathroom in care of NT) Nurse Communication: Mobility status PT Visit Diagnosis: Muscle weakness (generalized) (M62.81);Unsteadiness on feet (R26.81)    Time: [id number] Time Calculation (min) (ACUTE ONLY): 17 min   Charges:   PT Evaluation $PT Eval Low Complexity: 1 Low PT Treatments $Therapeutic Activity: 8-22 mins        VLavone Nian PT, DPT 11/22/20, 1:41 PM   VWaunita Schooner8/03/2021, 1:39 PM

## 2020-11-22 NOTE — Telephone Encounter (Signed)
Please advise. Ok to refer?

## 2020-11-23 DIAGNOSIS — N179 Acute kidney failure, unspecified: Secondary | ICD-10-CM

## 2020-11-23 DIAGNOSIS — N390 Urinary tract infection, site not specified: Secondary | ICD-10-CM | POA: Diagnosis not present

## 2020-11-23 DIAGNOSIS — R55 Syncope and collapse: Secondary | ICD-10-CM | POA: Diagnosis not present

## 2020-11-23 LAB — BASIC METABOLIC PANEL
Anion gap: 5 (ref 5–15)
BUN: 40 mg/dL — ABNORMAL HIGH (ref 8–23)
CO2: 24 mmol/L (ref 22–32)
Calcium: 8.4 mg/dL — ABNORMAL LOW (ref 8.9–10.3)
Chloride: 105 mmol/L (ref 98–111)
Creatinine, Ser: 1.87 mg/dL — ABNORMAL HIGH (ref 0.44–1.00)
GFR, Estimated: 25 mL/min — ABNORMAL LOW (ref >60)
Glucose, Bld: 81 mg/dL (ref 70–99)
Potassium: 4.1 mmol/L (ref 3.5–5.1)
Sodium: 134 mmol/L — ABNORMAL LOW (ref 135–145)

## 2020-11-23 LAB — CBC
HCT: 28.4 % — ABNORMAL LOW (ref 36.0–46.0)
Hemoglobin: 9.3 g/dL — ABNORMAL LOW (ref 12.0–15.0)
MCH: 31.6 pg (ref 26.0–34.0)
MCHC: 32.7 g/dL (ref 30.0–36.0)
MCV: 96.6 fL (ref 80.0–100.0)
Platelets: 116 10*3/uL — ABNORMAL LOW (ref 150–400)
RBC: 2.94 MIL/uL — ABNORMAL LOW (ref 3.87–5.11)
RDW: 13.2 % (ref 11.5–15.5)
WBC: 4.8 10*3/uL (ref 4.0–10.5)
nRBC: 0 % (ref 0.0–0.2)

## 2020-11-23 LAB — GLUCOSE, CAPILLARY
Glucose-Capillary: 101 mg/dL — ABNORMAL HIGH (ref 70–99)
Glucose-Capillary: 82 mg/dL (ref 70–99)
Glucose-Capillary: 132 mg/dL — ABNORMAL HIGH (ref 70–99)
Glucose-Capillary: 109 mg/dL — ABNORMAL HIGH (ref 70–99)

## 2020-11-23 NOTE — Progress Notes (Signed)
[place] visited pt. per [facility] consult saying pt. requests to speak with chaplain.  Pt. sitting in recliner at bedside when [facility] arrived, inviting [place] to sit with her.  In extended visit, pt. shared memories from her childhood growing up on a farm [place], [place].  Pt. says she was admitted to the hospital for dehydration and that she has been having memory problems recently.  "My body is falling apart," she remarked.  "Whenever the Lord's ready to take me, I'm ready to go."  Pt. is the youngest of her siblings, only one brother of whom appear to be still living; pt. lost her oldest brother when she was 87yo to a tragic farm accident.  Pt. lives alone in her home near [facility] but appears to have some support from family who live nearby, notably her granddaughter and niece, and from home health aides.  Pt. says her pastor regularly calls her to check on her and that he may visit her in the hospital.  [facility] offered prayer for pt.'s healing and rest at end of visit.  No specific support needs expressed, but follow-up would likely be appreciated by pt.  Lindaann Pascal PRN [employer]

## 2020-11-23 NOTE — Progress Notes (Signed)
PROGRESS NOTE    [name]  [id number] DOB: 03/25/1925 DOA: 11/21/2020 PCP: Margo Common, PA-C  Assessment & Plan:   Principal Problem:   Syncope Active Problems:   Chronic diastolic CHF (congestive heart failure) (HCC)   AKI (acute kidney injury) ([place])   Syncope: etiology unclear. Echo showed from EF [id number], grade I diastolic dysfunction & no regional wall abnormalities. Orthostatic vitals were normal. Continue on tele    AKI on CKDIIIa: baseline Cr likely around 1.2. Cr is trending down from day prior. Continue to hold ACE-I    Hyperkalemia: resolved    [id number] cx is pending still. Continue on cipro    Chronic diastolic CHF: continue on BB. Continue to hold home dose of torsemide secondary to AKI on CKD   Thrombocytopenia: etiology unclear. Will continue to monitor     DVT prophylaxis: heparin  Code Status: full  Family Communication:  Disposition Plan: likely d/c home w/ HH, HH orders placed  Level of care: Med-Surg  Status is: Observation  The patient remains OBS appropriate and will d/c before 2 midnights.  Dispo: The patient is from: Home              Anticipated d/c is to: Home              Patient currently is not medically stable to d/c.   Difficult to place patient : unclear    Consultants:    Procedures:   Antimicrobials:    Subjective: Pt c/o malaise  Objective: Vitals:   11/22/20 1608 11/22/20 2038 11/23/20 0448 11/23/20 0537  BP: (!) 106/54 123/60 (!) 106/49   Pulse: (!) 57 62 (!) 58   Resp: '16 16 16   '$ Temp: 98 F (36.7 C) 98.5 F (36.9 C) (!) 97.5 F (36.4 C)   TempSrc:      SpO2: 98% 97% 99%   Weight:    50.5 kg  Height:        Intake/Output Summary (Last 24 hours) at 11/23/2020 0733 Last data filed at 11/23/2020 0600 Gross per 24 hour  Intake 200 ml  Output --  Net 200 ml   Filed Weights   11/21/20 1453 11/23/20 0537  Weight: 55.3 kg 50.5 kg    Examination:  General exam: Appears comfortable   Respiratory system: Clear breath sounds b/l  Cardiovascular system: S1/S2+. No rubs or clicks  Gastrointestinal system: Abd is soft, NT, ND & hypoactive bowel sounds  Central nervous system: Alert and oriented. Moves all extremities  Psychiatry: Judgement and insight appear normal. Flat mood and affect    Data Reviewed: I have personally reviewed following labs and imaging studies  CBC: Recent Labs  Lab 11/21/20 1503 11/22/20 0755 11/23/20 0421  WBC 5.6 6.4 4.8  NEUTROABS 4.0  --   --   HGB 11.0* 10.6* 9.3*  HCT 33.7* 31.0* 28.4*  MCV 97.7 94.8 96.6  PLT 123* 118* [id number]*   Basic Metabolic Panel: Recent Labs  Lab 11/21/20 1503 11/22/20 0755 11/23/20 0421  NA 133* 137 134*  K 5.2* 4.6 4.1  CL 104 107 105  CO2 20* 24 24  GLUCOSE 109* 71 81  BUN 60* 48* 40*  CREATININE 2.58* 2.15* 1.87*  CALCIUM 9.0 9.0 8.4*   GFR: Estimated Creatinine Clearance: 15 mL/min (A) (by C-G formula based on SCr of 1.87 mg/dL (H)). Liver Function Tests: No results for input(s): AST, ALT, ALKPHOS, BILITOT, PROT, ALBUMIN in the last 168 hours. No results for input(s): LIPASE, AMYLASE  in the last 168 hours. No results for input(s): AMMONIA in the last 168 hours. Coagulation Profile: No results for input(s): INR, PROTIME in the last 168 hours. Cardiac Enzymes: No results for input(s): CKTOTAL, CKMB, CKMBINDEX, TROPONINI in the last 168 hours. BNP (last 3 results) No results for input(s): PROBNP in the last 8760 hours. HbA1C: No results for input(s): HGBA1C in the last 72 hours. CBG: Recent Labs  Lab 11/22/20 0526 11/22/20 0743 11/22/20 1636 11/22/20 2040 11/23/20 0532  GLUCAP 84 71 102* 111* 101*   Lipid Profile: No results for input(s): CHOL, HDL, LDLCALC, TRIG, CHOLHDL, LDLDIRECT in the last 72 hours. Thyroid Function Tests: No results for input(s): TSH, T4TOTAL, FREET4, T3FREE, THYROIDAB in the last 72 hours. Anemia Panel: No results for input(s): VITAMINB12, FOLATE, FERRITIN,  TIBC, IRON, RETICCTPCT in the last 72 hours. Sepsis Labs: No results for input(s): PROCALCITON, LATICACIDVEN in the last 168 hours.  Recent Results (from the past 240 hour(s))  Resp Panel by RT-PCR (Flu A&B, Covid) Nasopharyngeal Swab     Status: None   Collection Time: 11/22/20  1:13 AM   Specimen: Nasopharyngeal Swab; Nasopharyngeal(NP) swabs in vial transport medium  Result Value Ref Range Status   SARS Coronavirus 2 by RT PCR NEGATIVE NEGATIVE Final    Comment: (NOTE) SARS-CoV-2 target nucleic acids are NOT DETECTED.  The SARS-CoV-2 RNA is generally detectable in upper respiratory specimens during the acute phase of infection. The lowest concentration of SARS-CoV-2 viral copies this assay can detect is 138 copies/mL. A negative result does not preclude SARS-Cov-2 infection and should not be used as the sole basis for treatment or other patient management decisions. A negative result may occur with  improper specimen collection/handling, submission of specimen other than nasopharyngeal swab, presence of viral mutation(s) within the areas targeted by this assay, and inadequate number of viral copies(<138 copies/mL). A negative result must be combined with clinical observations, patient history, and epidemiological information. The expected result is Negative.  Fact Sheet for Patients:  EntrepreneurPulse.com.au  Fact Sheet for Healthcare Providers:  IncredibleEmployment.be  This test is no t yet approved or cleared by the [place] FDA and  has been authorized for detection and/or diagnosis of SARS-CoV-2 by FDA under an Emergency Use Authorization (EUA). This EUA will remain  in effect (meaning this test can be used) for the duration of the COVID-19 declaration under Section 564(b)(1) of the Act, 21 U.S.C.section 360bbb-3(b)(1), unless the authorization is terminated  or revoked sooner.       Influenza A by PCR NEGATIVE NEGATIVE Final    Influenza B by PCR NEGATIVE NEGATIVE Final    Comment: (NOTE) The Xpert Xpress SARS-CoV-2/FLU/RSV plus assay is intended as an aid in the diagnosis of influenza from Nasopharyngeal swab specimens and should not be used as a sole basis for treatment. Nasal washings and aspirates are unacceptable for Xpert Xpress SARS-CoV-2/FLU/RSV testing.  Fact Sheet for Patients: EntrepreneurPulse.com.au  Fact Sheet for Healthcare Providers: IncredibleEmployment.be  This test is not yet approved or cleared by the [place] FDA and has been authorized for detection and/or diagnosis of SARS-CoV-2 by FDA under an Emergency Use Authorization (EUA). This EUA will remain in effect (meaning this test can be used) for the duration of the COVID-19 declaration under Section 564(b)(1) of the Act, 21 U.S.C. section 360bbb-3(b)(1), unless the authorization is terminated or revoked.  Performed at [facility], [address] [place], [place] [address]          Radiology Studies:  MR BRAIN WO CONTRAST  Result Date: 11/22/2020 CLINICAL DATA:  Initial evaluation for acute confusion. EXAM: MRI HEAD WITHOUT CONTRAST TECHNIQUE: Multiplanar, multiecho pulse sequences of the brain and surrounding structures were obtained without intravenous contrast. COMPARISON:  CT from 07/21/2020. FINDINGS: Brain: Examination moderately degraded by motion artifact. Diffuse prominence of the CSF containing spaces compatible generalized age-related cerebral atrophy. Patchy and confluent T2/FLAIR hyperintensity involving the periventricular deep white matter both cerebral hemispheres most consistent with chronic small vessel ischemic disease, moderate in nature. Few small remote lacunar infarcts noted about the basal ganglia and thalami. Probable small remote right cerebellar infarct noted. No abnormal foci of restricted diffusion to suggest acute or subacute ischemia. Gray-white matter  differentiation otherwise maintained. No other areas of remote cortical infarction. No foci of susceptibility artifact to suggest acute or chronic intracranial hemorrhage. No mass lesion, midline shift or mass effect. Mild ventricular prominence related to global parenchymal volume loss without hydrocephalus. No extra-axial fluid collection. Vascular: Major intracranial vascular flow voids are grossly maintained at the skull base. Skull and upper cervical spine: Craniocervical junction grossly within normal limits. Bone marrow signal intensity normal. No scalp soft tissue abnormality. Sinuses/Orbits: Prior bilateral ocular lens replacement. Globes and orbital soft tissues demonstrate no acute finding. Sequelae of prior sinus surgery noted. Underlying mild chronic mucosal thickening noted about the ethmoidal air cells and right maxillary sinus. Small bilateral mastoid effusions noted, of doubtful significance. Visualized nasopharynx unremarkable. Inner ear structures grossly normal. Other: None. IMPRESSION: 1. Motion degraded exam. No definite acute intracranial abnormality. 2. Age-related cerebral atrophy with moderate chronic small vessel ischemic disease. Electronically Signed   By: Jeannine Boga M.D.   On: 11/22/2020 00:14   ECHOCARDIOGRAM COMPLETE  Result Date: 11/22/2020    ECHOCARDIOGRAM REPORT   Patient Name:   [name] Date of Exam: 11/22/2020 Medical Rec #:  [id number]     Height:       64.0 in Accession #:    [id number]    Weight:       121.9 lb Date of Birth:  Dec 23, 1924    BSA:          1.585 m Patient Age:    56 years      BP:           111/53 mmHg Patient Gender: F             HR:           57 bpm. Exam Location:  [facility] Procedure: 2D Echo, Color Doppler and Cardiac Doppler Indications:     Syncope R55  History:         Patient has prior history of Echocardiogram examinations, most                  recent 08/17/2017. Risk Factors:Hypertension.  Sonographer:     Sherrie Sport Referring Phys:   [id number] [place] [facility] Diagnosing Phys: Kate Sable MD  Sonographer Comments: Suboptimal apical window. IMPRESSIONS  1. Left ventricular ejection fraction, by estimation, is 65 to 70%. The left ventricle has normal function. The left ventricle has no regional wall motion abnormalities. Left ventricular diastolic parameters are consistent with Grade I diastolic dysfunction (impaired relaxation).  2. Right ventricular systolic function is normal. The right ventricular size is not well visualized.  3. The mitral valve is grossly normal. No evidence of mitral valve regurgitation.  4. The aortic valve is tricuspid. Aortic valve regurgitation is not visualized. Mild to moderate aortic valve sclerosis/calcification is  present, without any evidence of aortic stenosis.  5. The inferior vena cava is normal in size with greater than 50% respiratory variability, suggesting right atrial pressure of 3 mmHg. FINDINGS  Left Ventricle: Left ventricular ejection fraction, by estimation, is 65 to 70%. The left ventricle has normal function. The left ventricle has no regional wall motion abnormalities. The left ventricular internal cavity size was normal in size. There is  no left ventricular hypertrophy. Left ventricular diastolic parameters are consistent with Grade I diastolic dysfunction (impaired relaxation). Right Ventricle: The right ventricular size is not well visualized. No increase in right ventricular wall thickness. Right ventricular systolic function is normal. Left Atrium: Left atrial size was normal in size. Right Atrium: Right atrial size was normal in size. Pericardium: There is no evidence of pericardial effusion. Mitral Valve: The mitral valve is grossly normal. No evidence of mitral valve regurgitation. Tricuspid Valve: The tricuspid valve is normal in structure. Tricuspid valve regurgitation is not demonstrated. Aortic Valve: The aortic valve is tricuspid. Aortic valve regurgitation is not visualized. Mild to  moderate aortic valve sclerosis/calcification is present, without any evidence of aortic stenosis. Aortic valve mean gradient measures 2.0 mmHg. Aortic valve peak gradient measures 4.5 mmHg. Aortic valve area, by VTI measures 3.06 cm. Pulmonic Valve: The pulmonic valve was not well visualized. Pulmonic valve regurgitation is not visualized. Aorta: The aortic root is normal in size and structure. Venous: The inferior vena cava is normal in size with greater than 50% respiratory variability, suggesting right atrial pressure of 3 mmHg. IAS/Shunts: No atrial level shunt detected by color flow Doppler.  LEFT VENTRICLE PLAX 2D LVIDd:         3.60 cm  Diastology LVIDs:         2.40 cm  LV e' medial:    4.13 cm/s LV PW:         1.10 cm  LV E/e' medial:  13.8 LV IVS:        0.85 cm  LV e' lateral:   5.98 cm/s LVOT diam:     2.00 cm  LV E/e' lateral: 9.5 LV SV:         60 LV SV Index:   38 LVOT Area:     3.14 cm  RIGHT VENTRICLE RV Basal diam:  4.20 cm RV S prime:     12.50 cm/s TAPSE (M-mode): 3.0 cm LEFT ATRIUM           Index       RIGHT ATRIUM           Index LA diam:      2.50 cm 1.58 cm/m  RA Area:     13.00 cm LA Vol (A2C): 15.2 ml 9.59 ml/m  RA Volume:   28.30 ml  17.85 ml/m LA Vol (A4C): 20.4 ml 12.87 ml/m  AORTIC VALVE                   PULMONIC VALVE AV Area (Vmax):    2.52 cm    PV Vmax:        0.54 m/s AV Area (Vmean):   2.82 cm    PV Peak grad:   1.2 mmHg AV Area (VTI):     3.06 cm    RVOT Peak grad: 1 mmHg AV Vmax:           106.00 cm/s AV Vmean:          67.600 cm/s AV VTI:  0.197 m AV Peak Grad:      4.5 mmHg AV Mean Grad:      2.0 mmHg LVOT Vmax:         84.90 cm/s LVOT Vmean:        60.700 cm/s LVOT VTI:          0.192 m LVOT/AV VTI ratio: 0.97  AORTA Ao Root diam: 2.80 cm MITRAL VALVE               TRICUSPID VALVE MV Area (PHT): 2.42 cm    TR Peak grad:   23.0 mmHg MV Decel Time: 314 msec    TR Vmax:        240.00 cm/s MV E velocity: 57.00 cm/s MV A velocity: 86.10 cm/s  SHUNTS MV E/A  ratio:  0.66        Systemic VTI:  0.19 m                            Systemic Diam: 2.00 cm Kate Sable MD Electronically signed by Kate Sable MD Signature Date/Time: 11/22/2020/3:59:13 PM    Final         Scheduled Meds:  heparin injection (subcutaneous)  5,000 Units Subcutaneous Q8H   metoprolol tartrate  12.5 mg Oral BID   sodium chloride flush  3 mL Intravenous Q12H   Continuous Infusions:  ciprofloxacin 400 mg (11/22/20 2214)     LOS: 0 days    Time spent: 30 mins     Wyvonnia Dusky, MD [facility] Pager [phone]  If 7PM-7AM, please contact night-coverage 11/23/2020, 7:33 AM

## 2020-11-24 DIAGNOSIS — E875 Hyperkalemia: Secondary | ICD-10-CM | POA: Diagnosis present

## 2020-11-24 DIAGNOSIS — Z79899 Other long term (current) drug therapy: Secondary | ICD-10-CM | POA: Diagnosis not present

## 2020-11-24 DIAGNOSIS — Z20822 Contact with and (suspected) exposure to covid-19: Secondary | ICD-10-CM | POA: Diagnosis present

## 2020-11-24 DIAGNOSIS — N179 Acute kidney failure, unspecified: Secondary | ICD-10-CM | POA: Diagnosis present

## 2020-11-24 DIAGNOSIS — N1831 Chronic kidney disease, stage 3a: Secondary | ICD-10-CM | POA: Diagnosis present

## 2020-11-24 DIAGNOSIS — N390 Urinary tract infection, site not specified: Secondary | ICD-10-CM | POA: Diagnosis present

## 2020-11-24 DIAGNOSIS — D696 Thrombocytopenia, unspecified: Secondary | ICD-10-CM | POA: Diagnosis present

## 2020-11-24 DIAGNOSIS — I5032 Chronic diastolic (congestive) heart failure: Secondary | ICD-10-CM | POA: Diagnosis present

## 2020-11-24 DIAGNOSIS — Z882 Allergy status to sulfonamides status: Secondary | ICD-10-CM | POA: Diagnosis not present

## 2020-11-24 DIAGNOSIS — Z8249 Family history of ischemic heart disease and other diseases of the circulatory system: Secondary | ICD-10-CM | POA: Diagnosis not present

## 2020-11-24 DIAGNOSIS — E872 Acidosis: Secondary | ICD-10-CM | POA: Diagnosis present

## 2020-11-24 DIAGNOSIS — I130 Hypertensive heart and chronic kidney disease with heart failure and stage 1 through stage 4 chronic kidney disease, or unspecified chronic kidney disease: Secondary | ICD-10-CM | POA: Diagnosis present

## 2020-11-24 DIAGNOSIS — Z880 Allergy status to penicillin: Secondary | ICD-10-CM | POA: Diagnosis not present

## 2020-11-24 DIAGNOSIS — Z881 Allergy status to other antibiotic agents status: Secondary | ICD-10-CM | POA: Diagnosis not present

## 2020-11-24 DIAGNOSIS — R55 Syncope and collapse: Secondary | ICD-10-CM | POA: Diagnosis present

## 2020-11-24 DIAGNOSIS — Z888 Allergy status to other drugs, medicaments and biological substances status: Secondary | ICD-10-CM | POA: Diagnosis not present

## 2020-11-24 DIAGNOSIS — E860 Dehydration: Secondary | ICD-10-CM | POA: Diagnosis present

## 2020-11-24 DIAGNOSIS — F32A Depression, unspecified: Secondary | ICD-10-CM | POA: Diagnosis present

## 2020-11-24 DIAGNOSIS — R4189 Other symptoms and signs involving cognitive functions and awareness: Secondary | ICD-10-CM | POA: Diagnosis present

## 2020-11-24 DIAGNOSIS — F0390 Unspecified dementia without behavioral disturbance: Secondary | ICD-10-CM | POA: Diagnosis present

## 2020-11-24 LAB — BASIC METABOLIC PANEL
Anion gap: 4 — ABNORMAL LOW (ref 5–15)
BUN: 39 mg/dL — ABNORMAL HIGH (ref 8–23)
CO2: 24 mmol/L (ref 22–32)
Calcium: 8.5 mg/dL — ABNORMAL LOW (ref 8.9–10.3)
Chloride: 106 mmol/L (ref 98–111)
Creatinine, Ser: 1.85 mg/dL — ABNORMAL HIGH (ref 0.44–1.00)
GFR, Estimated: 25 mL/min — ABNORMAL LOW (ref >60)
Glucose, Bld: 95 mg/dL (ref 70–99)
Potassium: 4.4 mmol/L (ref 3.5–5.1)
Sodium: 134 mmol/L — ABNORMAL LOW (ref 135–145)

## 2020-11-24 LAB — GLUCOSE, CAPILLARY: Glucose-Capillary: 90 mg/dL (ref 70–99)

## 2020-11-24 LAB — CBC
HCT: 28 % — ABNORMAL LOW (ref 36.0–46.0)
Hemoglobin: 9.2 g/dL — ABNORMAL LOW (ref 12.0–15.0)
MCH: 31.8 pg (ref 26.0–34.0)
MCHC: 32.9 g/dL (ref 30.0–36.0)
MCV: 96.9 fL (ref 80.0–100.0)
Platelets: 112 10*3/uL — ABNORMAL LOW (ref 150–400)
RBC: 2.89 MIL/uL — ABNORMAL LOW (ref 3.87–5.11)
RDW: 13.2 % (ref 11.5–15.5)
WBC: 5 10*3/uL (ref 4.0–10.5)
nRBC: 0 % (ref 0.0–0.2)

## 2020-11-24 LAB — URINE CULTURE

## 2020-11-24 NOTE — Progress Notes (Signed)
PROGRESS NOTE    [name]  [id number] DOB: 1925-04-03 DOA: 11/21/2020 PCP: Margo Common, PA-C  Assessment & Plan:   Principal Problem:   Syncope Active Problems:   Chronic diastolic CHF (congestive heart failure) (HCC)   AKI (acute kidney injury) ([place])   Syncope: etiology unclear. Echo showed from EF [id number], grade I diastolic dysfunction & no regional wall abnormalities. Orthostatic vitals were normal. Continue on tele    AKI on CKDIIIa: baseline Cr likely around 1.2. Cr continues to trend down daily    Hyperkalemia: resolved    UTI: urine cx shows multiple species. Will continue on cipro   Chronic diastolic CHF: continue on BB. Continue to hold home dose of torsemide secondary to AKI on CKD   Thrombocytopenia: etiology unclear. Will continue to monitor     DVT prophylaxis: heparin  Code Status: full  Family Communication: discussed pt's care w/ pt's granddaughter, Myra and answered her questions Disposition Plan: likely d/c home w/ HH  Level of care: Med-Surg  Status is: Inpatient  Remains inpatient appropriate because:Unsafe d/c plan and Inpatient level of care appropriate due to severity of illness, HH needs to be set up   Dispo: The patient is from: Home              Anticipated d/c is to: Home              Patient currently is not medically stable to d/c.   Difficult to place patient : unclear     Consultants:    Procedures:   Antimicrobials:    Subjective: Pt c/o malaise  Objective: Vitals:   11/23/20 2122 11/23/20 2345 11/24/20 0428 11/24/20 0431  BP: (!) 131/51 (!) 119/49 (!) 117/47   Pulse: 70 65 62   Resp:  19 19   Temp:  97.8 F (36.6 C) 97.8 F (36.6 C)   TempSrc:      SpO2:  97% 97%   Weight:    51.5 kg  Height:        Intake/Output Summary (Last 24 hours) at 11/24/2020 0726 Last data filed at 11/23/2020 1840 Gross per 24 hour  Intake 480 ml  Output --  Net 480 ml   Filed Weights   11/21/20 1453 11/23/20 0537  11/24/20 0431  Weight: 55.3 kg 50.5 kg 51.5 kg    Examination:  General exam: Appears comfortable. Decreased hearing  Respiratory system: Clear breath sounds b/l Cardiovascular system: S1 & S2+. No rubs or clicks  Gastrointestinal system: Abd is soft, NT, ND & hypoactive bowel sounds Central nervous system: Alert and awake. Moves all extremities  Psychiatry: Judgement and insight appear normal. Flat mood and affect     Data Reviewed: I have personally reviewed following labs and imaging studies  CBC: Recent Labs  Lab 11/21/20 1503 11/22/20 0755 11/23/20 0421 11/24/20 0450  WBC 5.6 6.4 4.8 5.0  NEUTROABS 4.0  --   --   --   HGB 11.0* 10.6* 9.3* 9.2*  HCT 33.7* 31.0* 28.4* 28.0*  MCV 97.7 94.8 96.6 96.9  PLT 123* 118* 116* [id number]*   Basic Metabolic Panel: Recent Labs  Lab 11/21/20 1503 11/22/20 0755 11/23/20 0421 11/24/20 0450  NA 133* 137 134* 134*  K 5.2* 4.6 4.1 4.4  CL 104 107 105 106  CO2 20* '24 24 24  '$ GLUCOSE 109* 71 81 95  BUN 60* 48* 40* 39*  CREATININE 2.58* 2.15* 1.87* 1.85*  CALCIUM 9.0 9.0 8.4* 8.5*   GFR: Estimated  Creatinine Clearance: 15.4 mL/min (A) (by C-G formula based on SCr of 1.85 mg/dL (H)). Liver Function Tests: No results for input(s): AST, ALT, ALKPHOS, BILITOT, PROT, ALBUMIN in the last 168 hours. No results for input(s): LIPASE, AMYLASE in the last 168 hours. No results for input(s): AMMONIA in the last 168 hours. Coagulation Profile: No results for input(s): INR, PROTIME in the last 168 hours. Cardiac Enzymes: No results for input(s): CKTOTAL, CKMB, CKMBINDEX, TROPONINI in the last 168 hours. BNP (last 3 results) No results for input(s): PROBNP in the last 8760 hours. HbA1C: No results for input(s): HGBA1C in the last 72 hours. CBG: Recent Labs  Lab 11/23/20 0532 11/23/20 0740 11/23/20 1133 11/23/20 1637 11/24/20 0429  GLUCAP 101* 82 132* 109* 90   Lipid Profile: No results for input(s): CHOL, HDL, LDLCALC, TRIG, CHOLHDL,  LDLDIRECT in the last 72 hours. Thyroid Function Tests: No results for input(s): TSH, T4TOTAL, FREET4, T3FREE, THYROIDAB in the last 72 hours. Anemia Panel: No results for input(s): VITAMINB12, FOLATE, FERRITIN, TIBC, IRON, RETICCTPCT in the last 72 hours. Sepsis Labs: No results for input(s): PROCALCITON, LATICACIDVEN in the last 168 hours.  Recent Results (from the past 240 hour(s))  Resp Panel by RT-PCR (Flu A&B, Covid) Nasopharyngeal Swab     Status: None   Collection Time: 11/22/20  1:13 AM   Specimen: Nasopharyngeal Swab; Nasopharyngeal(NP) swabs in vial transport medium  Result Value Ref Range Status   SARS Coronavirus 2 by RT PCR NEGATIVE NEGATIVE Final    Comment: (NOTE) SARS-CoV-2 target nucleic acids are NOT DETECTED.  The SARS-CoV-2 RNA is generally detectable in upper respiratory specimens during the acute phase of infection. The lowest concentration of SARS-CoV-2 viral copies this assay can detect is 138 copies/mL. A negative result does not preclude SARS-Cov-2 infection and should not be used as the sole basis for treatment or other patient management decisions. A negative result may occur with  improper specimen collection/handling, submission of specimen other than nasopharyngeal swab, presence of viral mutation(s) within the areas targeted by this assay, and inadequate number of viral copies(<138 copies/mL). A negative result must be combined with clinical observations, patient history, and epidemiological information. The expected result is Negative.  Fact Sheet for Patients:  EntrepreneurPulse.com.au  Fact Sheet for Healthcare Providers:  IncredibleEmployment.be  This test is no t yet approved or cleared by the [place] FDA and  has been authorized for detection and/or diagnosis of SARS-CoV-2 by FDA under an Emergency Use Authorization (EUA). This EUA will remain  in effect (meaning this test can be used) for the  duration of the COVID-19 declaration under Section 564(b)(1) of the Act, 21 U.S.C.section 360bbb-3(b)(1), unless the authorization is terminated  or revoked sooner.       Influenza A by PCR NEGATIVE NEGATIVE Final   Influenza B by PCR NEGATIVE NEGATIVE Final    Comment: (NOTE) The Xpert Xpress SARS-CoV-2/FLU/RSV plus assay is intended as an aid in the diagnosis of influenza from Nasopharyngeal swab specimens and should not be used as a sole basis for treatment. Nasal washings and aspirates are unacceptable for Xpert Xpress SARS-CoV-2/FLU/RSV testing.  Fact Sheet for Patients: EntrepreneurPulse.com.au  Fact Sheet for Healthcare Providers: IncredibleEmployment.be  This test is not yet approved or cleared by the [place] FDA and has been authorized for detection and/or diagnosis of SARS-CoV-2 by FDA under an Emergency Use Authorization (EUA). This EUA will remain in effect (meaning this test can be used) for the duration of the COVID-19 declaration under Section  564(b)(1) of the Act, 21 U.S.C. section 360bbb-3(b)(1), unless the authorization is terminated or revoked.  Performed at [facility], [address] [place], [place] [address]          Radiology Studies: ECHOCARDIOGRAM COMPLETE  Result Date: 11/22/2020    ECHOCARDIOGRAM REPORT   Patient Name:   KASHIA ZALESKY Date of Exam: 11/22/2020 Medical Rec #:  [id number]     Height:       64.0 in Accession #:    [id number]    Weight:       121.9 lb Date of Birth:  1924/10/21    BSA:          1.585 m Patient Age:    54 years      BP:           111/53 mmHg Patient Gender: F             HR:           57 bpm. Exam Location:  [facility] Procedure: 2D Echo, Color Doppler and Cardiac Doppler Indications:     Syncope R55  History:         Patient has prior history of Echocardiogram examinations, most                  recent 08/17/2017. Risk Factors:Hypertension.  Sonographer:     Sherrie Sport  Referring Phys:  [id number] [place] T [facility] Diagnosing Phys: Kate Sable MD  Sonographer Comments: Suboptimal apical window. IMPRESSIONS  1. Left ventricular ejection fraction, by estimation, is 65 to 70%. The left ventricle has normal function. The left ventricle has no regional wall motion abnormalities. Left ventricular diastolic parameters are consistent with Grade I diastolic dysfunction (impaired relaxation).  2. Right ventricular systolic function is normal. The right ventricular size is not well visualized.  3. The mitral valve is grossly normal. No evidence of mitral valve regurgitation.  4. The aortic valve is tricuspid. Aortic valve regurgitation is not visualized. Mild to moderate aortic valve sclerosis/calcification is present, without any evidence of aortic stenosis.  5. The inferior vena cava is normal in size with greater than 50% respiratory variability, suggesting right atrial pressure of 3 mmHg. FINDINGS  Left Ventricle: Left ventricular ejection fraction, by estimation, is 65 to 70%. The left ventricle has normal function. The left ventricle has no regional wall motion abnormalities. The left ventricular internal cavity size was normal in size. There is  no left ventricular hypertrophy. Left ventricular diastolic parameters are consistent with Grade I diastolic dysfunction (impaired relaxation). Right Ventricle: The right ventricular size is not well visualized. No increase in right ventricular wall thickness. Right ventricular systolic function is normal. Left Atrium: Left atrial size was normal in size. Right Atrium: Right atrial size was normal in size. Pericardium: There is no evidence of pericardial effusion. Mitral Valve: The mitral valve is grossly normal. No evidence of mitral valve regurgitation. Tricuspid Valve: The tricuspid valve is normal in structure. Tricuspid valve regurgitation is not demonstrated. Aortic Valve: The aortic valve is tricuspid. Aortic valve regurgitation is not  visualized. Mild to moderate aortic valve sclerosis/calcification is present, without any evidence of aortic stenosis. Aortic valve mean gradient measures 2.0 mmHg. Aortic valve peak gradient measures 4.5 mmHg. Aortic valve area, by VTI measures 3.06 cm. Pulmonic Valve: The pulmonic valve was not well visualized. Pulmonic valve regurgitation is not visualized. Aorta: The aortic root is normal in size and structure. Venous: The inferior vena cava is normal in size with  greater than 50% respiratory variability, suggesting right atrial pressure of 3 mmHg. IAS/Shunts: No atrial level shunt detected by color flow Doppler.  LEFT VENTRICLE PLAX 2D LVIDd:         3.60 cm  Diastology LVIDs:         2.40 cm  LV e' medial:    4.13 cm/s LV PW:         1.10 cm  LV E/e' medial:  13.8 LV IVS:        0.85 cm  LV e' lateral:   5.98 cm/s LVOT diam:     2.00 cm  LV E/e' lateral: 9.5 LV SV:         60 LV SV Index:   38 LVOT Area:     3.14 cm  RIGHT VENTRICLE RV Basal diam:  4.20 cm RV S prime:     12.50 cm/s TAPSE (M-mode): 3.0 cm LEFT ATRIUM           Index       RIGHT ATRIUM           Index LA diam:      2.50 cm 1.58 cm/m  RA Area:     13.00 cm LA Vol (A2C): 15.2 ml 9.59 ml/m  RA Volume:   28.30 ml  17.85 ml/m LA Vol (A4C): 20.4 ml 12.87 ml/m  AORTIC VALVE                   PULMONIC VALVE AV Area (Vmax):    2.52 cm    PV Vmax:        0.54 m/s AV Area (Vmean):   2.82 cm    PV Peak grad:   1.2 mmHg AV Area (VTI):     3.06 cm    RVOT Peak grad: 1 mmHg AV Vmax:           106.00 cm/s AV Vmean:          67.600 cm/s AV VTI:            0.197 m AV Peak Grad:      4.5 mmHg AV Mean Grad:      2.0 mmHg LVOT Vmax:         84.90 cm/s LVOT Vmean:        60.700 cm/s LVOT VTI:          0.192 m LVOT/AV VTI ratio: 0.97  AORTA Ao Root diam: 2.80 cm MITRAL VALVE               TRICUSPID VALVE MV Area (PHT): 2.42 cm    TR Peak grad:   23.0 mmHg MV Decel Time: 314 msec    TR Vmax:        240.00 cm/s MV E velocity: 57.00 cm/s MV A velocity: 86.10  cm/s  SHUNTS MV E/A ratio:  0.66        Systemic VTI:  0.19 m                            Systemic Diam: 2.00 cm Kate Sable MD Electronically signed by Kate Sable MD Signature Date/Time: 11/22/2020/3:59:13 PM    Final         Scheduled Meds:  heparin injection (subcutaneous)  5,000 Units Subcutaneous Q8H   metoprolol tartrate  12.5 mg Oral BID   sodium chloride flush  3 mL Intravenous Q12H   Continuous Infusions:  ciprofloxacin 400 mg (11/23/20 2128)  LOS: 0 days    Time spent: 25 mins     Wyvonnia Dusky, MD [facility] Pager [phone]  If 7PM-7AM, please contact night-coverage 11/24/2020, 7:26 AM

## 2020-11-25 LAB — CBC
HCT: 27.9 % — ABNORMAL LOW (ref 36.0–46.0)
Hemoglobin: 9.2 g/dL — ABNORMAL LOW (ref 12.0–15.0)
MCH: 30.9 pg (ref 26.0–34.0)
MCHC: 33 g/dL (ref 30.0–36.0)
MCV: 93.6 fL (ref 80.0–100.0)
Platelets: 109 10*3/uL — ABNORMAL LOW (ref 150–400)
RBC: 2.98 MIL/uL — ABNORMAL LOW (ref 3.87–5.11)
RDW: 13.2 % (ref 11.5–15.5)
WBC: 5 10*3/uL (ref 4.0–10.5)
nRBC: 0 % (ref 0.0–0.2)

## 2020-11-25 LAB — BASIC METABOLIC PANEL
Anion gap: 5 (ref 5–15)
BUN: 35 mg/dL — ABNORMAL HIGH (ref 8–23)
CO2: 23 mmol/L (ref 22–32)
Calcium: 8.8 mg/dL — ABNORMAL LOW (ref 8.9–10.3)
Chloride: 106 mmol/L (ref 98–111)
Creatinine, Ser: 1.69 mg/dL — ABNORMAL HIGH (ref 0.44–1.00)
GFR, Estimated: 28 mL/min — ABNORMAL LOW (ref >60)
Glucose, Bld: 90 mg/dL (ref 70–99)
Potassium: 4.6 mmol/L (ref 3.5–5.1)
Sodium: 134 mmol/L — ABNORMAL LOW (ref 135–145)

## 2020-11-25 LAB — GLUCOSE, CAPILLARY: Glucose-Capillary: 98 mg/dL (ref 70–99)

## 2020-11-25 MED ORDER — CIPROFLOXACIN HCL 500 MG PO TABS: 500.0000 mg | ORAL_TABLET | Freq: Two times a day (BID) | ORAL | 0 refills | Status: AC

## 2020-11-25 NOTE — TOC Transition Note (Signed)
Transition of Care [facility]) - CM/SW Discharge Note   Patient Details  Name: [name] MRN: [id number] Date of Birth: Apr 10, 1925  [facility] CM/SW Contact:  Candie Chroman, LCSW Phone Number: 11/25/2020, 12:05 PM   Clinical Narrative:  Patient has orders to discharge home today. Tried calling patient in room but no answer. Called granddaughter [name], introduced role, and explained that therapy recommendations would be discussed. She is declining home health at this time but will call her PCP if she changes her mind after she returns home. Patient already has a walker at home. Robin's daughter [name] will pick patient up this afternoon. No further concerns. CSW signing off.   Final next level of care: Home/Self Care Barriers to Discharge: No Barriers Identified   Patient Goals and CMS Choice        Discharge Placement                Patient to be transferred to facility by: Great-granddaughter will pick her up Name of family member notified: [name] Lrhezzioui Patient and family notified of of transfer: 11/25/20  Discharge Plan and Services                                     Social Determinants of Health (SDOH) Interventions     Readmission Risk Interventions No flowsheet data found.

## 2020-11-25 NOTE — Progress Notes (Signed)
Physical Therapy Treatment Patient Details Name: [name] MRN: [id number] DOB: 1924/10/22 Today's Date: 11/25/2020    History of Present Illness Pt is a 87 y/o F admitted on 11/21/20 after presenting with c/c of syncopal episode. PMH: cognitive impairment, chronic diastolic heart failure, HTN, CKD stage 3A, cluster HA, depression    PT Comments    Pt is making good progress towards goals. Pt reports she is very tired but hopeful she can remain in recliner for lunch. Good endurance with there-ex and minimal ambulation distance performed-mainly limited due to fatigue. Will continue to progress as able.   Follow Up Recommendations  Home health PT;Supervision/Assistance - 24 hour     Equipment Recommendations   (reports she has RW)    Recommendations for Other Services       Precautions / Restrictions Precautions Precautions: Fall Restrictions Weight Bearing Restrictions: No    Mobility  Bed Mobility               General bed mobility comments: NT up in recliner    Transfers Overall transfer level: Needs assistance Equipment used: Rolling walker (2 wheeled) Transfers: Sit to/from Stand Sit to Stand: Supervision         General transfer comment: safe technique  Ambulation/Gait Ambulation/Gait assistance: Supervision Gait Distance (Feet): 40 Feet Assistive device: Rolling walker (2 wheeled) Gait Pattern/deviations: Step-through pattern     General Gait Details: Reciprocal gait pattern with use of RW. Distance limited due to fatigue   Stairs             Wheelchair Mobility    Modified Rankin (Stroke Patients Only)       Balance Overall balance assessment: Needs assistance Sitting-balance support: No upper extremity supported;Feet supported;Single extremity supported Sitting balance-Leahy Scale: Good     Standing balance support: Bilateral upper extremity supported Standing balance-Leahy Scale: Good                               Cognition Arousal/Alertness: Awake/alert Behavior During Therapy: WFL for tasks assessed/performed Overall Cognitive Status: Within Functional Limits for tasks assessed                                 General Comments: pt alert and oriented, following commands well      Exercises Other Exercises Other Exercises: Pt performed standing and marching in place to help the "stiffness" in her legs, tolerated well, no LOB, stood + intermittent marching + supervision with UE vs BUE support on RW for ~62mn before endorsing fatigue and requesting to return to sitting. Other Exercises: seated ther-ex performed on B LE including AP, quad sets, hip abd/add, and LAQ. All ther-ex performed x 10 reps with supervision    General Comments        Pertinent Vitals/Pain Pain Assessment: No/denies pain    Home Living                      Prior Function            PT Goals (current goals can now be found in the care plan section) Acute Rehab PT Goals Patient Stated Goal: to go home PT Goal Formulation: With patient Time For Goal Achievement: 12/06/20 Potential to Achieve Goals: Fair Progress towards PT goals: Progressing toward goals    Frequency    Min 2X/week  PT Plan Current plan remains appropriate    Co-evaluation              AM-PAC PT "6 Clicks" Mobility   Outcome Measure  Help needed turning from your back to your side while in a flat bed without using bedrails?: None Help needed moving from lying on your back to sitting on the side of a flat bed without using bedrails?: A Little Help needed moving to and from a bed to a chair (including a wheelchair)?: A Little Help needed standing up from a chair using your arms (e.g., wheelchair or bedside chair)?: A Little Help needed to walk in hospital room?: A Little Help needed climbing 3-5 steps with a railing? : A Little 6 Click Score: 19    End of Session Equipment Utilized During  Treatment: Gait belt Activity Tolerance: Patient tolerated treatment well Patient left: in chair;with chair alarm set Nurse Communication: Mobility status PT Visit Diagnosis: Muscle weakness (generalized) (M62.81);Unsteadiness on feet (R26.81)     Time: [id number] PT Time Calculation (min) (ACUTE ONLY): 12 min  Charges:  $Gait Training: 8-22 mins                     Greggory Stallion, [place], DPT [phone]    [name] 11/25/2020, 1:07 PM

## 2020-11-25 NOTE — Progress Notes (Signed)
Discharge Note:  Reviewed discharge instructions with pt and great-granddaughter. Pt and great-granddaughter verbalized understanding. Both IV cath intact upon removal. PT d/ced with personal belongings. Staff wheeled pt out. Pt transported to home via family vehicle.

## 2020-11-25 NOTE — Progress Notes (Signed)
Occupational Therapy Treatment Patient Details Name: [name] MRN: [id number] DOB: 08/31/1924 Today's Date: 11/25/2020    History of present illness Pt is a 87 y/o F admitted on 11/21/20 after presenting with c/c of syncopal episode. PMH: cognitive impairment, chronic diastolic heart failure, HTN, CKD stage 3A, cluster HA, depression   OT comments  Pt seen for OT tx this date. Pt received in recliner, agreeable to OT, eager to get out of the recliner to help the "stiffness" in her legs. Pt transferred from recliner with RW with supervision. Pt performed standing and marching in place to help the "stiffness" in her legs, tolerated well, no LOB, stood + intermittent marching + supervision with UE vs BUE support on RW for ~30mn before endorsing fatigue and requesting to return to sitting. [place] to [place]    Follow Up Recommendations  Home health OT;Supervision/Assistance - 24 hour    Equipment Recommendations  None recommended by OT    Recommendations for Other Services      Precautions / Restrictions Precautions Precautions: Fall Restrictions Weight Bearing Restrictions: No       Mobility Bed Mobility               General bed mobility comments: NT up in recliner    Transfers Overall transfer level: Needs assistance Equipment used: Rolling walker (2 wheeled) Transfers: Sit to/from Stand Sit to Stand: Supervision         General transfer comment: no VC    Balance Overall balance assessment: Needs assistance Sitting-balance support: No upper extremity supported;Feet supported;Single extremity supported Sitting balance-Leahy Scale: Good     Standing balance support: Bilateral upper extremity supported Standing balance-Leahy Scale: Good                             ADL either performed or assessed with clinical judgement   ADL                                               Vision       Perception      Praxis      Cognition Arousal/Alertness: Awake/alert Behavior During Therapy: WFL for tasks assessed/performed Overall Cognitive Status: No family/caregiver present to determine baseline cognitive functioning                                 General Comments: pt alert and oriented, following commands well        Exercises Other Exercises Other Exercises: Pt performed standing and marching in place to help the "stiffness" in her legs, tolerated well, no LOB, stood + intermittent marching + supervision with UE vs BUE support on RW for ~119m before endorsing fatigue and requesting to return to sitting.   Shoulder Instructions       General Comments      Pertinent Vitals/ Pain          Home Living                                          Prior Functioning/Environment              Frequency  Min  2X/week        Progress Toward Goals  OT Goals(current goals can now be found in the care plan section)  Progress towards OT goals: Progressing toward goals  Acute Rehab OT Goals Patient Stated Goal: to go home OT Goal Formulation: With patient Time For Goal Achievement: 12/06/20 Potential to Achieve Goals: Good  Plan Discharge plan remains appropriate;Frequency remains appropriate    Co-evaluation                 AM-PAC OT "6 Clicks" Daily Activity     Outcome Measure   Help from another person eating meals?: None Help from another person taking care of personal grooming?: None Help from another person toileting, which includes using toliet, bedpan, or urinal?: A Little Help from another person bathing (including washing, rinsing, drying)?: A Little Help from another person to put on and taking off regular upper body clothing?: None Help from another person to put on and taking off regular lower body clothing?: A Little 6 Click Score: 21    End of Session Equipment Utilized During Treatment: Rolling walker  OT Visit  Diagnosis: Other abnormalities of gait and mobility (R26.89);History of falling (Z91.81);Muscle weakness (generalized) (M62.81)   Activity Tolerance Patient tolerated treatment well   Patient Left in chair;with call bell/phone within reach;with chair alarm set   Nurse Communication          Time: [id number] OT Time Calculation (min): 15 min  Charges: OT General Charges $OT Visit: 1 Visit OT Treatments $Therapeutic Activity: 8-22 mins  Hanley Hays, MPH, MS, OTR/L ascom [phone] 11/25/20, 11:44 AM

## 2020-11-25 NOTE — Discharge Summary (Signed)
Physician Discharge Summary  [name] [id number] DOB: 10/02/1924 DOA: 11/21/2020  PCP: Margo Common, PA-C  Admit date: 11/21/2020 Discharge date: 11/25/2020  Admitted From: home  Disposition:  home  Recommendations for Outpatient Follow-up:  Follow up with PCP in 1-2 weeks   Home Health: no as pt's granddaughter declined it  Equipment/Devices:  Discharge Condition: stable  CODE STATUS: full  Diet recommendation: Heart Healthy  Brief/Interim Summary: HPI was taken from Dr. Flossie Buffy: [name] is a 87 y.o. female with medical history significant for Cognitive impairment, chronic diastolic heart failure, hypertension, CKD stage IIIa, cluster headache, and depression who presents with concerns of syncopal episode.   Pt was a poor historian.  She reports that she came in because of fall where she just slipped out of a chair but reportedly per ED documentation patient was brought in for a syncopal episode.  She was using the bathroom when family noticed that she appeared pale and diaphoretic.  She then went limp on the toilet and was unresponsive for roughly 5 minutes.  She was found by EMS on toilet. Patient reportedly complained of some abdominal pain and nausea over the past couple days. She denies any pain.  No nausea vomiting or diarrhea.  Felt she has been urinating more than usual. Denies tobacco, alcohol illicit drug use   In the ED, she was afebrile normotensive on room air.  CBC without leukocytosis.  Hemoglobin of 11.  Platelet of 123.  Sodium of 133.  K of 5.2.  Creatinine elevated to 2.58 from prior of 1.5.  CO2 20.  No anion gap.  BG of 109. Troponin of 28 and 31 UA shows small leukocyte and negative nitrite.   Treatment for UTI was initially deferred by EDP but per documentation after further discussion with granddaughter for increased confusion IV Cipro was given.  MRI was also pursued due to family's concern of strokes which has resulted negative.  Hospital  course from Dr. Jimmye Norman 8/12-8/15/22: Pt presented after a syncopal episode of unknown etiology. Echo was done which showed EF Q000111Q, grade I diastolic dysfunction & no regional wall abnormalities. Orthostatic vitals were normal. Of note, pt was found to have AKI on CKD in which Cr continued to trend down and pt's home dose of torsemide was held while inpatient and restarted at d/c. PT/OT evaluated the pt and recommended home health but pt's granddaughter refused.    Discharge Diagnoses:  Principal Problem:   Syncope Active Problems:   Chronic diastolic CHF (congestive heart failure) (HCC)   AKI (acute kidney injury) ([place])  Syncope: etiology unclear. Echo showed from EF Q000111Q, grade I diastolic dysfunction & no regional wall abnormalities. Orthostatic vitals were normal. Continue on tele    AKI on CKDIIIa: baseline Cr likely around 1.2. Cr is trending down daily    Hyperkalemia: resolved    UTI: urine cx shows multiple species. Will continue on cipro to complete the course   Chronic diastolic CHF: continue on BB & restart home dose of torsemide at d/c   Thrombocytopenia: etiology unclear. Will continue to monitor    Discharge Instructions  Discharge Instructions     Diet - low sodium heart healthy   Complete by: As directed    Discharge instructions   Complete by: As directed    F/u w/ PCP in 1 week   Increase activity slowly   Complete by: As directed       Allergies as of 11/25/2020  Reactions   Accolate [zafirlukast] Other (See Comments)   Reaction: unknown   Bactrim [sulfamethoxazole-trimethoprim] Nausea And Vomiting   Metronidazole Other (See Comments)   Reaction: unknown   Nitrofurantoin Monohyd Macro Other (See Comments)   GI upset   Penicillins Diarrhea, Other (See Comments)   Has patient had a PCN reaction causing immediate rash, facial/tongue/throat swelling, SOB or lightheadedness with hypotension: Unknown Has patient had a PCN reaction causing  severe rash involving mucus membranes or skin necrosis: Unknown Has patient had a PCN reaction that required hospitalization: Unknown Has patient had a PCN reaction occurring within the last 10 years: Unknown If all of the above answers are "NO", then may proceed with Cephalosporin use.   Keflex [cephalexin] Rash        Medication List     TAKE these medications    acetaminophen 325 MG tablet Commonly known as: TYLENOL Take 2 tablets (650 mg total) by mouth every 6 (six) hours as needed for mild pain (or Fever >/= 101).   ciprofloxacin 500 MG tablet Commonly known as: Cipro Take 1 tablet (500 mg total) by mouth 2 (two) times daily for 1 day.   doxycycline 100 MG capsule Commonly known as: VIBRAMYCIN Take 100 mg by mouth 2 (two) times daily.   enalapril 10 MG tablet Commonly known as: VASOTEC Take 1 tablet (10 mg total) by mouth daily.   metoprolol tartrate 25 MG tablet Commonly known as: LOPRESSOR Take 0.5 tablets (12.5 mg total) by mouth 2 (two) times daily.   torsemide 20 MG tablet Commonly known as: DEMADEX Take 20 mg by mouth every morning.   traZODone 50 MG tablet Commonly known as: DESYREL Take 1/2 to 1 tablet by mouth at bedtime for sleep.        Follow-up Information     Chrismon, Vickki Muff, PA-C. Go on 11/28/2020.   Specialty: Family Medicine Why: For creatinine recheck;  Dr. Rosanna Randy Appt;  Appt @ 1:20 pm Contact information: [place] [address] [phone]                Allergies  Allergen Reactions   Accolate [Zafirlukast] Other (See Comments)    Reaction: unknown   Bactrim [Sulfamethoxazole-Trimethoprim] Nausea And Vomiting   Metronidazole Other (See Comments)    Reaction: unknown   Nitrofurantoin Monohyd Macro Other (See Comments)    GI upset   Penicillins Diarrhea and Other (See Comments)    Has patient had a PCN reaction causing immediate rash, facial/tongue/throat swelling, SOB or lightheadedness with  hypotension: Unknown Has patient had a PCN reaction causing severe rash involving mucus membranes or skin necrosis: Unknown Has patient had a PCN reaction that required hospitalization: Unknown Has patient had a PCN reaction occurring within the last 10 years: Unknown If all of the above answers are "NO", then may proceed with Cephalosporin use.   Keflex [Cephalexin] Rash    Consultations:    Procedures/Studies: MR BRAIN WO CONTRAST  Result Date: 11/22/2020 CLINICAL DATA:  Initial evaluation for acute confusion. EXAM: MRI HEAD WITHOUT CONTRAST TECHNIQUE: Multiplanar, multiecho pulse sequences of the brain and surrounding structures were obtained without intravenous contrast. COMPARISON:  CT from 07/21/2020. FINDINGS: Brain: Examination moderately degraded by motion artifact. Diffuse prominence of the CSF containing spaces compatible generalized age-related cerebral atrophy. Patchy and confluent T2/FLAIR hyperintensity involving the periventricular deep white matter both cerebral hemispheres most consistent with chronic small vessel ischemic disease, moderate in nature. Few small remote lacunar infarcts noted about the basal ganglia and  thalami. Probable small remote right cerebellar infarct noted. No abnormal foci of restricted diffusion to suggest acute or subacute ischemia. Gray-white matter differentiation otherwise maintained. No other areas of remote cortical infarction. No foci of susceptibility artifact to suggest acute or chronic intracranial hemorrhage. No mass lesion, midline shift or mass effect. Mild ventricular prominence related to global parenchymal volume loss without hydrocephalus. No extra-axial fluid collection. Vascular: Major intracranial vascular flow voids are grossly maintained at the skull base. Skull and upper cervical spine: Craniocervical junction grossly within normal limits. Bone marrow signal intensity normal. No scalp soft tissue abnormality. Sinuses/Orbits: Prior  bilateral ocular lens replacement. Globes and orbital soft tissues demonstrate no acute finding. Sequelae of prior sinus surgery noted. Underlying mild chronic mucosal thickening noted about the ethmoidal air cells and right maxillary sinus. Small bilateral mastoid effusions noted, of doubtful significance. Visualized nasopharynx unremarkable. Inner ear structures grossly normal. Other: None. IMPRESSION: 1. Motion degraded exam. No definite acute intracranial abnormality. 2. Age-related cerebral atrophy with moderate chronic small vessel ischemic disease. Electronically Signed   By: Jeannine Boga M.D.   On: 11/22/2020 00:14   ECHOCARDIOGRAM COMPLETE  Result Date: 11/22/2020    ECHOCARDIOGRAM REPORT   Patient Name:   LE INGS Date of Exam: 11/22/2020 Medical Rec #:  [id number]     Height:       64.0 in Accession #:    [id number]    Weight:       121.9 lb Date of Birth:  10-03-1924    BSA:          1.585 m Patient Age:    87 years      BP:           111/53 mmHg Patient Gender: F             HR:           57 bpm. Exam Location:  [facility] Procedure: 2D Echo, Color Doppler and Cardiac Doppler Indications:     Syncope R55  History:         Patient has prior history of Echocardiogram examinations, most                  recent 08/17/2017. Risk Factors:Hypertension.  Sonographer:     Sherrie Sport Referring Phys:  [id number] Morse T TU Diagnosing Phys: Kate Sable MD  Sonographer Comments: Suboptimal apical window. IMPRESSIONS  1. Left ventricular ejection fraction, by estimation, is 65 to 70%. The left ventricle has normal function. The left ventricle has no regional wall motion abnormalities. Left ventricular diastolic parameters are consistent with Grade I diastolic dysfunction (impaired relaxation).  2. Right ventricular systolic function is normal. The right ventricular size is not well visualized.  3. The mitral valve is grossly normal. No evidence of mitral valve regurgitation.  4. The aortic valve is  tricuspid. Aortic valve regurgitation is not visualized. Mild to moderate aortic valve sclerosis/calcification is present, without any evidence of aortic stenosis.  5. The inferior vena cava is normal in size with greater than 50% respiratory variability, suggesting right atrial pressure of 3 mmHg. FINDINGS  Left Ventricle: Left ventricular ejection fraction, by estimation, is 65 to 70%. The left ventricle has normal function. The left ventricle has no regional wall motion abnormalities. The left ventricular internal cavity size was normal in size. There is  no left ventricular hypertrophy. Left ventricular diastolic parameters are consistent with Grade I diastolic dysfunction (impaired relaxation). Right Ventricle: The right ventricular size is not well  visualized. No increase in right ventricular wall thickness. Right ventricular systolic function is normal. Left Atrium: Left atrial size was normal in size. Right Atrium: Right atrial size was normal in size. Pericardium: There is no evidence of pericardial effusion. Mitral Valve: The mitral valve is grossly normal. No evidence of mitral valve regurgitation. Tricuspid Valve: The tricuspid valve is normal in structure. Tricuspid valve regurgitation is not demonstrated. Aortic Valve: The aortic valve is tricuspid. Aortic valve regurgitation is not visualized. Mild to moderate aortic valve sclerosis/calcification is present, without any evidence of aortic stenosis. Aortic valve mean gradient measures 2.0 mmHg. Aortic valve peak gradient measures 4.5 mmHg. Aortic valve area, by VTI measures 3.06 cm. Pulmonic Valve: The pulmonic valve was not well visualized. Pulmonic valve regurgitation is not visualized. Aorta: The aortic root is normal in size and structure. Venous: The inferior vena cava is normal in size with greater than 50% respiratory variability, suggesting right atrial pressure of 3 mmHg. IAS/Shunts: No atrial level shunt detected by color flow Doppler.  LEFT  VENTRICLE PLAX 2D LVIDd:         3.60 cm  Diastology LVIDs:         2.40 cm  LV e' medial:    4.13 cm/s LV PW:         1.10 cm  LV E/e' medial:  13.8 LV IVS:        0.85 cm  LV e' lateral:   5.98 cm/s LVOT diam:     2.00 cm  LV E/e' lateral: 9.5 LV SV:         60 LV SV Index:   38 LVOT Area:     3.14 cm  RIGHT VENTRICLE RV Basal diam:  4.20 cm RV S prime:     12.50 cm/s TAPSE (M-mode): 3.0 cm LEFT ATRIUM           Index       RIGHT ATRIUM           Index LA diam:      2.50 cm 1.58 cm/m  RA Area:     13.00 cm LA Vol (A2C): 15.2 ml 9.59 ml/m  RA Volume:   28.30 ml  17.85 ml/m LA Vol (A4C): 20.4 ml 12.87 ml/m  AORTIC VALVE                   PULMONIC VALVE AV Area (Vmax):    2.52 cm    PV Vmax:        0.54 m/s AV Area (Vmean):   2.82 cm    PV Peak grad:   1.2 mmHg AV Area (VTI):     3.06 cm    RVOT Peak grad: 1 mmHg AV Vmax:           106.00 cm/s AV Vmean:          67.600 cm/s AV VTI:            0.197 m AV Peak Grad:      4.5 mmHg AV Mean Grad:      2.0 mmHg LVOT Vmax:         84.90 cm/s LVOT Vmean:        60.700 cm/s LVOT VTI:          0.192 m LVOT/AV VTI ratio: 0.97  AORTA Ao Root diam: 2.80 cm MITRAL VALVE               TRICUSPID VALVE MV Area (PHT): 2.42 cm  TR Peak grad:   23.0 mmHg MV Decel Time: 314 msec    TR Vmax:        240.00 cm/s MV E velocity: 57.00 cm/s MV A velocity: 86.10 cm/s  SHUNTS MV E/A ratio:  0.66        Systemic VTI:  0.19 m                            Systemic Diam: 2.00 cm Kate Sable MD Electronically signed by Kate Sable MD Signature Date/Time: 11/22/2020/3:59:13 PM    Final    (Echo, Carotid, EGD, Colonoscopy, ERCP)    Subjective: Pt c/o fatigue    Discharge Exam: Vitals:   11/25/20 0724 11/25/20 1145  BP: (!) 119/54 115/67  Pulse: 61 (!) 51  Resp: 15 16  Temp: 98.2 F (36.8 C) 98.6 F (37 C)  SpO2: 97% 98%   Vitals:   11/24/20 2129 11/25/20 0544 11/25/20 0724 11/25/20 1145  BP: (!) 132/56 (!) 102/43 (!) 119/54 115/67  Pulse: 65 (!) 57 61 (!) 51   Resp:  '16 15 16  '$ Temp:  98.4 F (36.9 C) 98.2 F (36.8 C) 98.6 F (37 C)  TempSrc:   Oral   SpO2:  95% 97% 98%  Weight:   54.8 kg   Height:        General: Pt is alert, awake, not in acute distress Cardiovascular: S1/S2 +, no rubs, no gallops Respiratory: CTA bilaterally, no wheezing, no rhonchi Abdominal: Soft, NT, ND, bowel sounds + Extremities: no edema, no cyanosis    The results of significant diagnostics from this hospitalization (including imaging, microbiology, ancillary and laboratory) are listed below for reference.     Microbiology: Recent Results (from the past 240 hour(s))  Resp Panel by RT-PCR (Flu A&B, Covid) Nasopharyngeal Swab     Status: None   Collection Time: 11/22/20  1:13 AM   Specimen: Nasopharyngeal Swab; Nasopharyngeal(NP) swabs in vial transport medium  Result Value Ref Range Status   SARS Coronavirus 2 by RT PCR NEGATIVE NEGATIVE Final    Comment: (NOTE) SARS-CoV-2 target nucleic acids are NOT DETECTED.  The SARS-CoV-2 RNA is generally detectable in upper respiratory specimens during the acute phase of infection. The lowest concentration of SARS-CoV-2 viral copies this assay can detect is 138 copies/mL. A negative result does not preclude SARS-Cov-2 infection and should not be used as the sole basis for treatment or other patient management decisions. A negative result may occur with  improper specimen collection/handling, submission of specimen other than nasopharyngeal swab, presence of viral mutation(s) within the areas targeted by this assay, and inadequate number of viral copies(<138 copies/mL). A negative result must be combined with clinical observations, patient history, and epidemiological information. The expected result is Negative.  Fact Sheet for Patients:  EntrepreneurPulse.com.au  Fact Sheet for Healthcare Providers:  IncredibleEmployment.be  This test is no t yet approved or cleared by  the [place] FDA and  has been authorized for detection and/or diagnosis of SARS-CoV-2 by FDA under an Emergency Use Authorization (EUA). This EUA will remain  in effect (meaning this test can be used) for the duration of the COVID-19 declaration under Section 564(b)(1) of the Act, 21 U.S.C.section 360bbb-3(b)(1), unless the authorization is terminated  or revoked sooner.       Influenza A by PCR NEGATIVE NEGATIVE Final   Influenza B by PCR NEGATIVE NEGATIVE Final    Comment: (NOTE) The Xpert Xpress SARS-CoV-2/FLU/RSV plus  assay is intended as an aid in the diagnosis of influenza from Nasopharyngeal swab specimens and should not be used as a sole basis for treatment. Nasal washings and aspirates are unacceptable for Xpert Xpress SARS-CoV-2/FLU/RSV testing.  Fact Sheet for Patients: EntrepreneurPulse.com.au  Fact Sheet for Healthcare Providers: IncredibleEmployment.be  This test is not yet approved or cleared by the [place] FDA and has been authorized for detection and/or diagnosis of SARS-CoV-2 by FDA under an Emergency Use Authorization (EUA). This EUA will remain in effect (meaning this test can be used) for the duration of the COVID-19 declaration under Section 564(b)(1) of the Act, 21 U.S.C. section 360bbb-3(b)(1), unless the authorization is terminated or revoked.  Performed at [facility], [address] [place], [place] [address]   Urine Culture     Status: Abnormal   Collection Time: 11/22/20  3:33 PM   Specimen: Urine, Random  Result Value Ref Range Status   Specimen Description   Final    URINE, RANDOM Performed at [facility] [address] [place], [place] [address]    Special Requests   Final    NONE Performed at [facility], [address] [place], [place] [address]    Culture MULTIPLE SPECIES PRESENT, SUGGEST RECOLLECTION (A)  Final   Report Status 11/24/2020 FINAL  Final      Labs: BNP (last 3 results) Recent Labs    06/21/20 1523 07/21/20 1447  BNP 264.1* Q000111Q*   Basic Metabolic Panel: Recent Labs  Lab 11/21/20 1503 11/22/20 0755 11/23/20 0421 11/24/20 0450 11/25/20 0511  NA 133* 137 134* 134* 134*  K 5.2* 4.6 4.1 4.4 4.6  CL 104 107 105 106 106  CO2 20* '24 24 24 23  '$ GLUCOSE 109* 71 81 95 90  BUN 60* 48* 40* 39* 35*  CREATININE 2.58* 2.15* 1.87* 1.85* 1.69*  CALCIUM 9.0 9.0 8.4* 8.5* 8.8*   Liver Function Tests: No results for input(s): AST, ALT, ALKPHOS, BILITOT, PROT, ALBUMIN in the last 168 hours. No results for input(s): LIPASE, AMYLASE in the last 168 hours. No results for input(s): AMMONIA in the last 168 hours. CBC: Recent Labs  Lab 11/21/20 1503 11/22/20 0755 11/23/20 0421 11/24/20 0450 11/25/20 0511  WBC 5.6 6.4 4.8 5.0 5.0  NEUTROABS 4.0  --   --   --   --   HGB 11.0* 10.6* 9.3* 9.2* 9.2*  HCT 33.7* 31.0* 28.4* 28.0* 27.9*  MCV 97.7 94.8 96.6 96.9 93.6  PLT 123* 118* 116* 112* 109*   Cardiac Enzymes: No results for input(s): CKTOTAL, CKMB, CKMBINDEX, TROPONINI in the last 168 hours. BNP: Invalid input(s): POCBNP CBG: Recent Labs  Lab 11/23/20 0740 11/23/20 1133 11/23/20 1637 11/24/20 0429 11/25/20 0542  GLUCAP 82 132* 109* 90 98   D-Dimer No results for input(s): DDIMER in the last 72 hours. Hgb A1c No results for input(s): HGBA1C in the last 72 hours. Lipid Profile No results for input(s): CHOL, HDL, LDLCALC, TRIG, CHOLHDL, LDLDIRECT in the last 72 hours. Thyroid function studies No results for input(s): TSH, T4TOTAL, T3FREE, THYROIDAB in the last 72 hours.  Invalid input(s): FREET3 Anemia work up No results for input(s): VITAMINB12, FOLATE, FERRITIN, TIBC, IRON, RETICCTPCT in the last 72 hours. Urinalysis    Component Value Date/Time   COLORURINE STRAW (A) 11/21/2020 1533   APPEARANCEUR CLEAR (A) 11/21/2020 1533   APPEARANCEUR Clear 08/27/2017 1132   LABSPEC 1.010 11/21/2020 1533   LABSPEC  1.014 08/09/2013 2100   PHURINE 5.0 11/21/2020  [place] 11/21/2020 1533   GLUCOSEU Negative 08/09/2013 2100   HGBUR NEGATIVE 11/21/2020 1533   [place] 11/21/2020 1533   BILIRUBINUR Negative 08/27/2017 1132   BILIRUBINUR Negative 08/09/2013 2100   KETONESUR NEGATIVE 11/21/2020 1533   PROTEINUR NEGATIVE 11/21/2020 1533   UROBILINOGEN 0.2 08/16/2017 1439   NITRITE NEGATIVE 11/21/2020 1533   LEUKOCYTESUR SMALL (A) 11/21/2020 1533   LEUKOCYTESUR Negative 08/09/2013 2100   Sepsis Labs Invalid input(s): PROCALCITONIN,  WBC,  LACTICIDVEN Microbiology Recent Results (from the past 240 hour(s))  Resp Panel by RT-PCR (Flu A&B, Covid) Nasopharyngeal Swab     Status: None   Collection Time: 11/22/20  1:13 AM   Specimen: Nasopharyngeal Swab; Nasopharyngeal(NP) swabs in vial transport medium  Result Value Ref Range Status   SARS Coronavirus 2 by RT PCR NEGATIVE NEGATIVE Final    Comment: (NOTE) SARS-CoV-2 target nucleic acids are NOT DETECTED.  The SARS-CoV-2 RNA is generally detectable in upper respiratory specimens during the acute phase of infection. The lowest concentration of SARS-CoV-2 viral copies this assay can detect is 138 copies/mL. A negative result does not preclude SARS-Cov-2 infection and should not be used as the sole basis for treatment or other patient management decisions. A negative result may occur with  improper specimen collection/handling, submission of specimen other than nasopharyngeal swab, presence of viral mutation(s) within the areas targeted by this assay, and inadequate number of viral copies(<138 copies/mL). A negative result must be combined with clinical observations, patient history, and epidemiological information. The expected result is Negative.  Fact Sheet for Patients:  EntrepreneurPulse.com.au  Fact Sheet for Healthcare Providers:  IncredibleEmployment.be  This test is no t yet  approved or cleared by the [place] FDA and  has been authorized for detection and/or diagnosis of SARS-CoV-2 by FDA under an Emergency Use Authorization (EUA). This EUA will remain  in effect (meaning this test can be used) for the duration of the COVID-19 declaration under Section 564(b)(1) of the Act, 21 U.S.C.section 360bbb-3(b)(1), unless the authorization is terminated  or revoked sooner.       Influenza A by PCR NEGATIVE NEGATIVE Final   Influenza B by PCR NEGATIVE NEGATIVE Final    Comment: (NOTE) The Xpert Xpress SARS-CoV-2/FLU/RSV plus assay is intended as an aid in the diagnosis of influenza from Nasopharyngeal swab specimens and should not be used as a sole basis for treatment. Nasal washings and aspirates are unacceptable for Xpert Xpress SARS-CoV-2/FLU/RSV testing.  Fact Sheet for Patients: EntrepreneurPulse.com.au  Fact Sheet for Healthcare Providers: IncredibleEmployment.be  This test is not yet approved or cleared by the [place] FDA and has been authorized for detection and/or diagnosis of SARS-CoV-2 by FDA under an Emergency Use Authorization (EUA). This EUA will remain in effect (meaning this test can be used) for the duration of the COVID-19 declaration under Section 564(b)(1) of the Act, 21 U.S.C. section 360bbb-3(b)(1), unless the authorization is terminated or revoked.  Performed at [facility], [address], [place], [place] [address]   Urine Culture     Status: Abnormal   Collection Time: 11/22/20  3:33 PM   Specimen: Urine, Random  Result Value Ref Range Status   Specimen Description   Final    URINE, RANDOM Performed at [facility], [address], [place], [place] [address]    Special Requests   Final    NONE Performed at [facility], [address] [place], [place] [address]    Culture MULTIPLE SPECIES  PRESENT, SUGGEST RECOLLECTION (A)  Final   Report Status  11/24/2020 FINAL  Final     Time coordinating discharge: Over 30 minutes  SIGNED:   Wyvonnia Dusky, MD  [facility] 11/25/2020, 11:51 AM Pager   If 7PM-7AM, please contact night-coverage

## 2020-11-25 NOTE — Plan of Care (Signed)
No acute events during the night. VSS. IV abx administered. Voices no complaints at this time.  Problem: Education: Goal: Knowledge of General Education information will improve Description: Including pain rating scale, medication(s)/side effects and non-pharmacologic comfort measures Outcome: Progressing   Problem: Health Behavior/Discharge Planning: Goal: Ability to manage health-related needs will improve Outcome: Progressing   Problem: Clinical Measurements: Goal: Ability to maintain clinical measurements within normal limits will improve Outcome: Progressing Goal: Will remain free from infection Outcome: Progressing Goal: Diagnostic test results will improve Outcome: Progressing Goal: Respiratory complications will improve Outcome: Progressing Goal: Cardiovascular complication will be avoided Outcome: Progressing   Problem: Activity: Goal: Risk for activity intolerance will decrease Outcome: Progressing   Problem: Nutrition: Goal: Adequate nutrition will be maintained Outcome: Progressing   Problem: Coping: Goal: Level of anxiety will decrease Outcome: Progressing   Problem: Elimination: Goal: Will not experience complications related to bowel motility Outcome: Progressing Goal: Will not experience complications related to urinary retention Outcome: Progressing   Problem: Pain Managment: Goal: General experience of comfort will improve Outcome: Progressing   Problem: Safety: Goal: Ability to remain free from injury will improve Outcome: Progressing   Problem: Skin Integrity: Goal: Risk for impaired skin integrity will decrease Outcome: Progressing

## 2020-11-26 NOTE — Telephone Encounter (Signed)
Please review. Thanks!  

## 2020-11-26 NOTE — Telephone Encounter (Signed)
Order for referral placed.

## 2020-11-28 ENCOUNTER — Inpatient Hospital Stay: Payer: PPO | Admitting: Family Medicine

## 2020-11-28 NOTE — Progress Notes (Deleted)
      Established patient visit   Patient: [name]   DOB: 10/23/1924   87 y.o. Female  MRN: [id number] Visit Date: 11/28/2020  Today's healthcare provider: Wilhemena Durie, MD   No chief complaint on file.  Subjective  -------------------------------------------------------------------------------------------------------------------- HPI  Follow up Hospitalization  Patient was admitted to [facility] on 11/21/2020 and discharged on 11/25/2020. She was treated for syncopal episode and UTI.  Treatment for this included IV fluids and Cipro.  She reports {excellent/good/fair:19992} compliance with treatment. She reports this condition is {resolved/improved/worsened:23923}.  ----------------------------------------------------------------------------------------- -   {Show patient history (optional):23778}   Medications: Outpatient Medications Prior to Visit  Medication Sig   acetaminophen (TYLENOL) 325 MG tablet Take 2 tablets (650 mg total) by mouth every 6 (six) hours as needed for mild pain (or Fever >/= 101).   doxycycline (VIBRAMYCIN) 100 MG capsule Take 100 mg by mouth 2 (two) times daily.   enalapril (VASOTEC) 10 MG tablet Take 1 tablet (10 mg total) by mouth daily.   metoprolol tartrate (LOPRESSOR) 25 MG tablet Take 0.5 tablets (12.5 mg total) by mouth 2 (two) times daily.   torsemide (DEMADEX) 20 MG tablet Take 20 mg by mouth every morning.   traZODone (DESYREL) 50 MG tablet Take 1/2 to 1 tablet by mouth at bedtime for sleep.   No facility-administered medications prior to visit.    Review of Systems  {Labs  Heme  Chem  Endocrine  Serology  Results Review (optional):23779}   Objective  -------------------------------------------------------------------------------------------------------------------- There were no vitals taken for this visit. {Show previous vital signs (optional):23777}   Physical Exam  ***  No results found for any visits on 11/28/20.   Assessment & Plan  ---------------------------------------------------------------------------------------------------------------------- ***  No follow-ups on file.      {provider attestation***:1}   Wilhemena Durie, MD  [facility] [phone] (phone) [phone] (fax)  [place]

## 2020-12-31 ENCOUNTER — Other Ambulatory Visit: Payer: Self-pay | Admitting: Family Medicine

## 2021-01-16 ENCOUNTER — Ambulatory Visit: Payer: PPO | Admitting: Psychiatry

## 2021-02-12 ENCOUNTER — Ambulatory Visit: Payer: Self-pay

## 2021-02-12 NOTE — Telephone Encounter (Signed)
Pt called, nephew wife answered and stated they needed updated list of medications due to taking over care for pt. Reviewed medications with Pam (wife). No other questions or concerns were needed. She verbalized understanding.    Summary: Medication Managment   Patient states her nephew is helping with confirming the names of her current medication.Patient would like nurse to call nephew Pari Lombard and wife to confirm the names of current medications. Patient would like nurse to call today      Answer Assessment - Initial Assessment Questions 1. REASON FOR CALL or QUESTION: "What is your reason for calling today?" or "How can I best help you?" or "What question do you have that I can help answer?"     Need a list of medications  Protocols used: Information Only Call - No Triage-A-AH

## 2021-03-05 NOTE — Progress Notes (Signed)
[facility]  PATIENT: [name] DOB: 09/06/1924  REFERRING CLINICIAN: Chrismon, Vickki Muff, PA-C HISTORY FROM: self, granddaughter REASON FOR VISIT: memory loss   HISTORICAL  CHIEF COMPLAINT:  Chief Complaint  Patient presents with   Dementia    RM 2 with granddaughter Juliene Pina  Pt is well, granddaughter states she has noticed memory issues since March since she is now her caregiver. She can remember things from a long time ago but her short term memory is the worse    HISTORY OF PRESENT ILLNESS:  The patient presents for evaluation of memory loss which has been present over the past year and has been progressively worsening. Her short term memory is the most effected. She will forget what she is supposed to be doing and will forget things she is supposed to tell people. Was found to be double paying bills and not paying other bills. Family has also noticed mood changes and increased irritability. She has also had visual hallucinations (3 young people in her living room). Tells intricate stories about these hallucinations but is not bothered by them. [place] in April of this year when the wheel of her walker got stuck on the corner of her stove. Otherwise has not had balance issues with her walker.  MRI brain 11/21/20 showed multiple remote lacunar infarcts, generalized cerebral atrophy, moderate small vessel disease  TTE 11/22/20 showed grade 1 diastolic dysfunction with normal EF.  TBI:  No known history of TBI, hit head a lot playing basketball Stroke: Hx of strokes Seizures: No past history of seizures Sleep: No known history of sleep apnea, does not snore Mood: Family notices increased irritability  Functional status:  Patient lives alone. Cooking: Granddaughter has unplugged her stove as she had left the stove on all day previously. Eats microwaved food and sandwiches Cleaning: Sweeps around the house and washes clothes. Family vacuums for her. Shopping:  Family has done her shopping for the past 10 years Bathing: uses a bench in the shower so she doesn't fall. Only showers when someone else is home Driving: Stopped driving a long time ago Bills: Granddaughter takes care of finances as she was having trouble paying bills Medications: Nephew helps her take medications because she will forget to take them. Will be confused if she has to take more than one pill at a time Ever left the stove on by accident?: yes Getting lost going to familiar places?: no, doesn't drive Forgetting loved ones names?: confuses nephew's name with her deceased brother Word finding difficulty? yes  OTHER MEDICAL CONDITIONS: HTN, CHF, CKD   REVIEW OF SYSTEMS: Full 14 system review of systems performed and negative with exception of: memory loss  ALLERGIES: Allergies  Allergen Reactions   Accolate [Zafirlukast] Other (See Comments)    Reaction: unknown   Bactrim [Sulfamethoxazole-Trimethoprim] Nausea And Vomiting   Metronidazole Other (See Comments)    Reaction: unknown   Nitrofurantoin Monohyd Macro Other (See Comments)    GI upset   Penicillins Diarrhea and Other (See Comments)    Has patient had a PCN reaction causing immediate rash, facial/tongue/throat swelling, SOB or lightheadedness with hypotension: Unknown Has patient had a PCN reaction causing severe rash involving mucus membranes or skin necrosis: Unknown Has patient had a PCN reaction that required hospitalization: Unknown Has patient had a PCN reaction occurring within the last 10 years: Unknown If all of the above answers are "NO", then may proceed with Cephalosporin use.   Keflex [Cephalexin] Rash    HOME  MEDICATIONS: Outpatient Medications Prior to Visit  Medication Sig Dispense Refill   acetaminophen (TYLENOL) 325 MG tablet Take 2 tablets (650 mg total) by mouth every 6 (six) hours as needed for mild pain (or Fever >/= 101).     enalapril (VASOTEC) 10 MG tablet Take 1 tablet (10 mg total) by  mouth daily. 90 tablet 1   metoprolol tartrate (LOPRESSOR) 25 MG tablet Take 0.5 tablets (12.5 mg total) by mouth 2 (two) times daily. 180 tablet 2   torsemide (DEMADEX) 20 MG tablet Take 20 mg by mouth every morning.  11   doxycycline (VIBRAMYCIN) 100 MG capsule Take 100 mg by mouth 2 (two) times daily.     traZODone (DESYREL) 50 MG tablet TAKE 1/2 TO 1 TABLET BY MOUTH AT BEDTIME FOR SLEEP. 90 tablet 0   No facility-administered medications prior to visit.    PAST MEDICAL HISTORY: Past Medical History:  Diagnosis Date   Breast cyst    removed   Diverticulitis    Gallstones    Hypertension    Low sodium levels     PAST SURGICAL HISTORY: Past Surgical History:  Procedure Laterality Date   ABDOMINAL HYSTERECTOMY  2000   APPENDECTOMY  1940's   bladder tack  1990's   BREAST CYST INCISION AND DRAINAGE Left 1990's   CHOLECYSTECTOMY  09/11/13   Cholangiogram suggested a retained stone ductal dilatation.   ERCP W/ SPHICTEROTOMY  09/12/2013   Recurrent stone identified post cholecystectomy.   NASAL SINUS SURGERY      FAMILY HISTORY: Family History  Problem Relation Age of Onset   Hypertension Father    CAD Father    Cancer Brother        unknown type    SOCIAL HISTORY: Social History   Socioeconomic History   Marital status: Widowed    Spouse name: Not on file   Number of children: 1   Years of education: Not on file   Highest education level: 12th grade  Occupational History   Occupation: retired  Tobacco Use   Smoking status: Never   Smokeless tobacco: Never  Vaping Use   Vaping Use: Never used  Substance and Sexual Activity   Alcohol use: No   Drug use: No   Sexual activity: Not Currently  Other Topics Concern   Not on file  Social History Narrative   Not on file   Social Determinants of Health   Financial Resource Strain: Low Risk    Difficulty of Paying Living Expenses: Not hard at all  Food Insecurity: No Food Insecurity   Worried About Ship [place] in the Last Year: Never true   [place] in the Last Year: Never true  Transportation Needs: No Transportation Needs   Lack of Transportation (Medical): No   Lack of Transportation (Non-Medical): No  Physical Activity: Inactive   Days of Exercise per Week: 0 days   Minutes of Exercise per Session: 0 min  Stress: No Stress Concern Present   Feeling of Stress : Not at all  Social Connections: Socially Isolated   Frequency of Communication with Friends and Family: More than three times a week   Frequency of Social Gatherings with Friends and Family: More than three times a week   Attends Religious Services: Never   Marine scientist or Organizations: No   Attends Archivist Meetings: Never   Marital Status: Widowed  Intimate Partner Violence: Not At Risk   Fear of Current or  Ex-Partner: No   Emotionally Abused: No   Physically Abused: No   Sexually Abused: No     PHYSICAL EXAM  GENERAL EXAM/CONSTITUTIONAL: Vitals:  Vitals:   03/10/21 1026  BP: (!) 165/77  Pulse: (!) 52  Weight: 119 lb (54 kg)  Height: 5\' 1"  (1.549 m)   Body mass index is 22.48 kg/m. Wt Readings from Last 3 Encounters:  03/10/21 119 lb (54 kg)  11/25/20 120 lb 13 oz (54.8 kg)  09/02/20 122 lb (55.3 kg)   Patient is in no distress; well developed, nourished and groomed; neck is supple  CARDIOVASCULAR: Examination of peripheral vascular system by observation and palpation is normal  EYES: Pupils round and reactive to light, Visual fields full to confrontation, Extraocular movements intacts,   MUSCULOSKELETAL: Gait, strength, tone, movements noted in Neurologic exam below  NEUROLOGIC: MENTAL STATUS:  MMSE - [place] Exam 03/10/2021  Orientation to time 2  Orientation to Place 4  Registration 3  Attention/ Calculation 3  Recall 0  Language- name 2 objects 2  Language- repeat 1  Language- follow 3 step command 3  Language- read & follow direction 1  Write  a sentence 1  Copy design 0  Total score 20    CRANIAL NERVE:  2nd, 3rd, 4th, 6th - pupils equal and reactive to light, visual fields full to confrontation, extraocular muscles intact, no nystagmus 5th - facial sensation symmetric 7th - facial strength symmetric 8th - decreased hearing bilaterally 11th - shoulder shrug symmetric 12th - tongue protrusion midline  MOTOR:  normal bulk and tone, no cogwheeling, full strength in the BUE, BLE  SENSORY:  normal and symmetric to light touch all 4 extremities  COORDINATION:  finger-nose-finger, fine finger movements normal, no tremor  REFLEXES:  deep tendon reflexes present and symmetric  GAIT/STATION:  normal     DIAGNOSTIC DATA (LABS, IMAGING, TESTING) - I reviewed patient records, labs, notes, testing and imaging myself where available.  Lab Results  Component Value Date   WBC 5.0 11/25/2020   HGB 9.2 (L) 11/25/2020   HCT 27.9 (L) 11/25/2020   MCV 93.6 11/25/2020   PLT 109 (L) 11/25/2020      Component Value Date/Time   NA 134 (L) 11/25/2020 0511   NA 139 09/02/2020 0901   NA 132 (L) 09/13/2013 0518   K 4.6 11/25/2020 0511   K 3.7 09/13/2013 0518   CL 106 11/25/2020 0511   CL 101 09/13/2013 0518   CO2 23 11/25/2020 0511   CO2 26 09/13/2013 0518   GLUCOSE 90 11/25/2020 0511   GLUCOSE 58 (L) 09/13/2013 0518   BUN 35 (H) 11/25/2020 0511   BUN 28 09/02/2020 0901   BUN 12 09/13/2013 0518   CREATININE 1.69 (H) 11/25/2020 0511   CREATININE 0.84 02/05/2017 1334   CALCIUM 8.8 (L) 11/25/2020 0511   CALCIUM 8.8 09/13/2013 0518   PROT 6.3 09/02/2020 0901   PROT 5.7 (L) 09/13/2013 0518   ALBUMIN 4.3 09/02/2020 0901   ALBUMIN 2.8 (L) 09/13/2013 0518   AST 23 09/02/2020 0901   AST 384 (H) 09/13/2013 0518   ALT 8 09/02/2020 0901   ALT 396 (H) 09/13/2013 0518   ALKPHOS 170 (H) 09/02/2020 0901   ALKPHOS 203 (H) 09/13/2013 0518   BILITOT 0.2 09/02/2020 0901   BILITOT 0.7 09/13/2013 0518   GFRNONAA 28 (L) 11/25/2020  0511   GFRNONAA 61 02/05/2017 1334   GFRAA 46 (L) 08/29/2019 1556   GFRAA 71 02/05/2017 1334  Lab Results  Component Value Date   CHOL 177 01/14/2016   HDL 62 01/14/2016   LDLCALC 89 01/14/2016   TRIG 128 01/14/2016   CHOLHDL 2.9 01/14/2016   No results found for: HGBA1C No results found for: VITAMINB12 Lab Results  Component Value Date   TSH 1.890 09/02/2020   MRI brain 11/21/20 personally reviewed. Positive for multiple remote lacunar infarcts, generalized cerebral atrophy, moderate small vessel disease   ASSESSMENT AND PLAN  87 y.o. year old female with a history of HTN, CHF, CKD who presents for evaluation of memory loss over the past year. MMSE today is 20, consistent with a mild dementia. Memory issues are impacting her ADLs including paying bills, cooking, and taking medications. Discussed that she is likely showing signs of early Alzheimer's disease. Provided information on diagnosis and prognosis. Offered to start donepezil, but patient would prefer to hold off at this time. Discussed finding of remote strokes on MRI. She was unaware that she had prior strokes. Will check blood work for stroke risk factors and reversible causes of dementia. Will also order MRA to assess for flow limiting stenosis.   1. Memory loss   2. Cerebrovascular accident (CVA), unspecified mechanism ([place])       PLAN: - Labs: TSH, B12, A1c, lipid panel - MRA head/neck (hx of CKD)  - Start ASA 81 mg daily  Orders Placed This Encounter  Procedures   MR ANGIO HEAD WO CONTRAST   MR ANGIO NECK WO CONTRAST   Vitamin B12   TSH   Lipid Panel   Hemoglobin A1c     Return in about 6 months (around 09/07/2021).  I spent an average of 60 minutes chart reviewing and counseling the patient, with at least 50% of the time face to face with the patient. General brain health measures discussed, including the importance of regular aerobic exercise. Reviewed safety measures.  Genia Harold,  MD 03/10/21 12:46 PM  [facility] [address] [place], [place] [address] [phone]

## 2021-03-10 ENCOUNTER — Encounter: Payer: Self-pay | Admitting: Psychiatry

## 2021-03-10 ENCOUNTER — Ambulatory Visit: Payer: PPO | Admitting: Psychiatry

## 2021-03-10 ENCOUNTER — Telehealth: Payer: Self-pay | Admitting: Psychiatry

## 2021-03-10 VITALS — BP 165/77 | HR 52 | Ht 61.0 in | Wt 119.0 lb

## 2021-03-10 DIAGNOSIS — I639 Cerebral infarction, unspecified: Secondary | ICD-10-CM

## 2021-03-10 DIAGNOSIS — R413 Other amnesia: Secondary | ICD-10-CM | POA: Diagnosis not present

## 2021-03-10 NOTE — Patient Instructions (Addendum)
MRA of the brain to look at blood vessels Blood work 3.   Take baby aspirin (81 mg) daily to help prevent strokes  DEMENTIA OVERVIEW "Dementia" is a general term for when a person has developed difficulties with reasoning, judgment, and memory. People who have dementia usually have some memory loss as well as difficulty in at least one other area, such as: ?Speaking or writing coherently (or understanding what is said or written) ?Recognizing familiar surroundings ?Planning and carrying out complex or multi-step tasks In order to be considered dementia these issues must be severe enough to interfere with a person's independence and daily activities. Dementia can be caused by several diseases that affect the brain. The most common cause is Alzheimer disease. Alzheimer disease is present in approximately 62 to 58 percent of all cases of dementia; other degenerative and/or vascular diseases may be present as well, particularly as a person gets older.   DEMENTIA RISK FACTORS There is no way to predict with certainty who will develop dementia. Each form of dementia has its own risk factors, but most forms have several risk factors in common. Age -- The biggest risk factor for dementia is age: dementia is rare in people younger than 29 years and becomes very common in people older than 48. For example, dementia affects approximately one in six people between 75 and 110 years old, one in three above 51 years, and almost half of people over age 59.  Family history -- Some forms of dementia have a genetic component, meaning that they tend to run in families. Having a close family member with Alzheimer disease increases your chances of developing it. People with a first-degree relative (parent or sibling) with Alzheimer disease have a greater chance of developing the disorder. The risk is probably highest if the family member developed Alzheimer disease at a younger age (less than 52 years old) and is lower if  the family member did not get Alzheimer disease until late in life. However, families that have a very strong genetic tendency toward Alzheimer disease are uncommon.  Other factors -- Studies indicate that high blood pressure, smoking, and diabetes may be risk factors for dementia. Experts are still not sure how treatment for these problems might influence your risk of developing dementia beyond their benefit of reducing stroke risk. Lifestyle factors have also been implicated in dementia. For instance, people who remain physically active, socially connected, and mentally engaged seem less likely to develop dementia than people who do not. These activities may produce more cognitive (mental) reserve or resilience, delaying the emergence of symptoms until an older age.  DEMENTIA SYMPTOMS Each form of dementia can cause difficulty with memory, language, reasoning, and judgment, but the symptoms are often very different from person to person. Symptoms also change over time. The differences between one form of dementia and another may only be recognizable to skilled health care providers who have experience working with people with dementia. Sometimes family members notice changes but mistakenly attribute them to aging.  Is memory loss normal? -- Many people worry that memory problems are related to early Alzheimer disease. However, some problems are normal and just related to aging, and do not signify a progressive dementia. Normal age-related changes often cause minor difficulties with immediate memory, for example, remembering a phone number or a set of directions for a short time. Temporary difficulty recalling proper names, even very familiar ones, is also common with aging. As people age normally, it is common to complain of  less efficient and slower processing and learning of new information. Memory changes due to normal aging are usually mild and do not worsen greatly over time, nor should they  interfere with a person's day-to-day functioning.  Early changes -- The earliest symptoms of Alzheimer disease are gradual and often subtle. Many people and their families first notice difficulty recalling recent events or information. This often emerges as a tendency to repeat stories or questions or to request or require repetition of material to be able to remember. If you find yourself telling an older family member or friend "I told you that earlier" or "You have told me that more than once," you might begin to suspect Alzheimer disease. Other changes can include one or more of the following: ?Difficulties with language (eg, not being able to find the right words for things) ?Difficulty with concentration and reasoning ?Problems with complex tasks like paying bills, cooking, or balancing a checkbook ?Getting lost in a familiar place  Late changes -- As Alzheimer disease progresses, a person's ability to think clearly continues to decline, and any or all of the changes listed above may be more disruptive. In addition, personality and behavioral symptoms can become quite troublesome. These can include: ?Increased anger or hostility, sometimes aggressive behavior; alternatively, some people become depressed or exhibit little interest in their surroundings (called "apathy") ?Sleep problems ?Hallucinations and/or delusions ?Disorientation ?Needing help with basic tasks (such as eating, bathing, and dressing) ?Incontinence (difficulty controlling the bladder and/or bowels)  The number of symptoms, the functions that are impaired, and the speed with which symptoms progress can vary widely from one person to the next. In some people, severe dementia occurs within five years of the diagnosis; for others, the progression can take more than 10 years. Most people with Alzheimer disease do not die from the disease itself, but rather from a secondary illness such as pneumonia, bladder infection, or  complications of a fall.  DEMENTIA DIAGNOSIS To diagnose dementia and identify the type of dementia, health care providers typically rely on the information they can gather by interacting with the person and speaking with their family members. The provider will typically perform memory and other cognitive (thinking) tests to assess the person's degree of difficulty with different types of problems. The results of these tests can then be monitored over time to observe whether functioning stays the same or declines.  Blood tests are usually done to find out if a chemical or hormonal imbalance or vitamin deficiency is contributing to the person's difficulties. Brain scans (usually MRI) are often performed in people with dementia to look for other problems. Sometimes the MRI can also help health care providers identify the type of dementia, since different types can have characteristic brain changes.  SAFETY AND LIFESTYLE ISSUES FOR PEOPLE WITH DEMENTIA A major issue for caregivers is making sure the person with dementia stays safe. Because many people with dementia do not realize that their mental functioning is impaired, they try to continue their day-to-day activities as usual. This can lead to physical danger, and caregivers must help to avoid situations that can threaten the safety of the person or others. The following information applies specifically to people with Alzheimer disease, but much of it is also relevant to people with other forms of dementia.  Medications -- People with Alzheimer disease often have trouble remembering to take medications they are prescribed for other conditions, or they become confused about which medications to take. They are also at increased risk for  potentially dangerous side effects from certain medications. Sedatives and certain other drugs (such as some antihistamines and antidepressants) may carry risk of increasing cognitive impairment. It is important to develop a  plan for medication monitoring and safety. People with dementia often need help taking their medications. It's a good idea to throw away old pill bottles and other medications that are no longer needed.  Driving -- Driving is often one of the first safety issues that arises in people with Alzheimer disease. In people with Alzheimer disease, the risk of having a car accident is significantly increased, especially as the disease progresses. It is best to discuss the issue of driving early, before the symptoms become advanced. Over time, everyone living long enough with dementia will reach a point where driving is too dangerous. Losing the ability to drive can be hard to accept because it represents independence for many people. It can also be challenging if the person does not completely appreciate their impairments in mental functioning or reaction time. In particular, driving at night may carry extra risk. Many people with mild but worrisome impairments will insist that they can safely drive locally or in the daytime. That may be true for the present time; however, people may forget that they have agreed to limitations. In addition, their ability to drive safely, even with restrictions, will deteriorate over time. A roadside driving test is often recommended if there is disagreement or uncertainty about a person's ability to drive. However, if a person with newly diagnosed, mild Alzheimer disease is deemed still able to drive, they will need to be reassessed every six months, with the understanding that driving will eventually no longer be possible. There may be important insurance implications of continued driving when medical records document advice to stop driving.  Cooking -- Cooking is another area that can lead to serious safety concerns and may require help or supervision. Symptoms such as distractibility, forgetfulness, and difficulty following directions can lead to burns, fires, or other injuries.  The use of gas cooking appliance raises a particular concern. A family member may have to ask the utility company to disconnect gas stoves if there is potential for accident or injury. Newer induction electric stoves do not change color when on, and may carry an inadvertent burn risk if a person forgets what they are doing.  Wandering -- As dementia progresses, some people with Alzheimer disease begin to wander. Because restlessness, distractibility, and memory problems are common, a person who wanders may easily become lost. Identification bracelets can help ensure that a lost wanderer gets home. The [employer] provides a "Wandering Support" program that provides ID tags and 24-hour assistance to patients who are preregistered for this program. There are many "locator" applications that allow the person with dementia to wear or carry a GPS device that a family member can track with their cell phone. Regular exercise may decrease the restlessness that can lead to wandering. Exercise is also just good practice to maintain strength, good sleep, and overall health. If wandering continues, wearable alarm systems are available that alert caretakers when the person leaves the home.  Falls -- Falls with secondary injuries are one of the most important causes of additional disability in people with all types of dementia, including Alzheimer disease. Commonly used medications can increase risk of falls and injuries. Hip fracture is a particular concern in older people, as it can lead to serious complications and sometimes even death. To reduce the risk of falls, potential tripping hazards  such as loose electrical cords, slippery rugs, and clutter should be removed. Inadvertent hoarding may develop and pose a safety risk. If the person lives alone, family members or elder services should perform safety inspections of the living space periodically. Medication lists should also be reviewed with your doctor to  identify those that might increase the risk of falling. Regular exercise, especially early in the course of dementia, and use of assistive devices like canes can also help with balance.  DEMENTIA TREATMENT Medications for Alzheimer disease -- Many people with Alzheimer disease will have the option of trying a medication. A trial of medication is usually begun for a period of a few to several weeks while the person is monitored for side effects and response. A health care provider should periodically review all medications to see if they are providing any benefit. It is important to have realistic expectations about the potential benefits of medication therapy in Alzheimer disease. None of these medications cure the disease, and the reality is that over time the person will continue to worsen. When medication does have an effect, the goal is not to stop progression of the disease, but to improve quality of life for the person and their family to the extent possible.  Treatment to slow or delay progression -- There is currently no cure for Alzheimer disease. However, experts are studying treatments in the hope of finding a way to slow the progression of the mental and functional decline, along with scientific efforts to prevent or delay onset.  Treatment of memory problems -- There are several medicines currently available for treating the memory problems associated with Alzheimer disease; they are also used in people with other forms of dementia.  ?Donepezil (brand name: Aricept) This medications allow more of a chemical called acetylcholine to be active in the brain, making up for drops in acetylcholine levels that happen in Alzheimer disease. It can cause side effects such as nausea, vomiting, and diarrhea in some people. It also may cause weight loss in many people. When taken at bedtime, cholinesterase inhibitors can cause very vivid dreams. If there is no improvement in symptoms or side effects are  bothersome, the medication should be stopped. Sometimes the person's symptoms will worsen after treatment is stopped; if this happens, the medication may be started again.  Treatment of behavioral symptoms -- The behavioral symptoms of Alzheimer disease are often more troubling than the cognitive (mental) symptoms. Even in mild cases, agitation, anxiety, and irritability can occur, and generally worsen as Alzheimer disease advances. This can be stressful for the person as well as for their family and caregivers. A combination of medications and behavioral therapy may be helpful. Non-medication therapies are preferred, as virtually all medications used for behavioral symptoms can increase confusion and many are associated with serious side effects and even an increased risk of death.  Depression -- Depression is common, especially in the early phases of dementia. It may be treated with behavioral therapy and/or with medications. The key is to recognize that depression may be playing a role in the person's symptoms. If depression is causing distress, it is worth treating. Potentially helpful medicines include a group of medicines known as selective serotonin reuptake inhibitors, or SSRIs, which are usually preferred over other choices in patients with dementia. Widely used SSRIs include fluoxetine (brand name: Prozac), sertraline (brand name: Zoloft), paroxetine (sample brand names: Brisdelle, Paxil), citalopram (brand name: Celexa), and escitalopram (brand names: Lexapro, Cipralex).   A variety of behavioral therapies are  often helpful, do not have the side effects often seen with medications, and may be recommended for depression. Behavioral therapy involves changing the person's environment (eg, regular exercise, avoiding triggers that cause sadness, socializing with others, engaging in pleasant activities that a person enjoys).  Agitation and aggression -- One of the most difficult issues for caregivers  and people with Alzheimer disease is aggressive behavior. Fortunately, this behavior is not common. However, many family members are reluctant to report aggressive behavior. In some cases, the behavior becomes physically abusive as dementia progresses. Agitation and aggression can be caused by a number of factors, including: ?Confusion, misunderstanding, or disorientation (doctors use the term "delirium" as a general term to describe a state of confusion in which a person does not think or behave normally) ?Frightening or paranoid delusions or hallucinations ?Depression or anxiety ?Sleep disorders, such as reduced sleep or altered sleep/wake cycles ?Certain medical conditions that can cause delirium, such as urinary tract infection or pneumonia ?Being in physical pain or discomfort ?Side effects of certain medications Delusions (ie, believing something that is not real or true) are common in patients with dementia, occurring in up to 30 percent of those with advanced disease. Paranoid delusions are particularly distressing to both the patients and the caregivers: these often include beliefs that someone has invaded the house, that family members have been replaced by impostors, that spouses have been unfaithful, or that personal possessions have been stolen.   Family members should discuss any concerns about aggressive behavior with a health care provider and arrange for help if necessary. The best treatment for these symptoms depends upon what triggers them. As an example, a person who becomes aggressive during periods of confusion might best be treated by talking through the problem, while someone who becomes aggressive during delusions might require medication. Often, behavior improves once an underlying medical condition is treated. Caregivers can learn strategies to help lessen the number of triggers and confrontations.   Sleep problems -- Sleep disorders can be treated with either medicine or  behavior changes or both: for example, limiting daytime naps, increasing physical activity, avoiding caffeine and alcohol in the evening. In some people, medication to help with sleep may be recommended, although these medications almost always have side effects (eg, worsened confusion and increased risk of falls). Maintaining daily rhythms, using artificial lighting when needed during the day, and avoiding bright light exposures during the night may help maintain normal wake-sleep cycles.   COPING WITH DEMENTIA Being diagnosed with any form of dementia can be distressing and overwhelming for the person affected as well as their loved ones. For people with dementia -- It is important for people with early dementia to care for their physical and mental health. This means getting regular checkups, taking medicines if needed, eating a healthy diet, exercising regularly, getting enough sleep, and avoiding activities that may be risky. It is often helpful to talk to others through support groups or a counselor or social worker to discuss any feelings of anxiety, frustration, anger, loneliness, or depression. All of these feelings are normal, and dealing with these feelings can help you to feel more in control of your life and health. It can also help to talk to other people who are going through a similar experience. Another issue to consider is how to tell your family and friends about your diagnosis. Explaining the disease can help others to understand what to expect and how they can help, now and in the future. This can be  especially helpful for children and grandchildren, who may not be familiar with the condition. While many people are able to live alone in the early stages of dementia, you may need help with tasks such as housekeeping, cooking, transportation, and paying bills. If possible, ask a friend or family member for help making plans to deal with these and other issues as dementia progresses.  Occupational therapists, and sometimes speech pathologists, can help to set up your home to minimize confusion and keep you independent for as long as possible. It's also important to establish Power of Attorney and Health Care Proxy statuses early, before a financial or health care crisis happens. This involves completing paperwork to determine who can make decisions on your behalf if needed. In addition, you should discuss your preferences regarding issues that are likely to become important as your dementia worsens, including: ?Is health insurance available, and what does it cover? ?Where will I live? ?Who will make health care and end-of-life decisions if I can't make them for myself? ?Who will pay for care? A number of resources are available to assist in this type of planning.   For caregivers -- Dementia can also impose an enormous burden on families and other caregivers. People with dementia become less able to care for themselves as the condition progresses. If you are caring for someone with dementia, the following may help: ?Make a daily plan and prepare to be flexible if needed. ?Try to be patient when responding to repetitive questions, behaviors, or statements. ?Try not to argue or confront the person with dementia when they express mistaken ideas or facts. Change the subject or gently remind the person of an inaccuracy. Arguing or trying to convince a person of "the truth" is a natural reaction but it can be frustrating to all and can trigger unwanted behavior and feelings. ?Use memory aids such as writing out a list of daily activities, phone numbers, and instructions for usual tasks (ie, the telephone, microwave, etc). It may help if these are posted and easily visible so that the person need not remember to look for the aids. ?Establish calm and consistent nighttime routines to manage behavioral problems, which are often worst at night. Leave a night light on in the person's  bedroom. ?Avoid major changes to the home environment (for example, rearranging furniture). ?Employ safety measures in the home, such as putting locks on medicine cabinets, keeping furniture in the same place to prevent falls, reducing clutter, removing electrical appliances from the bathroom, installing grab bars in the bathroom, and setting the water heater below 120F. ?Help the person with personal care tasks as needed. It is not necessary to bathe every day, although a health care provider should be notified if the person develops sores in the mouth or genitals related to hygiene problems (eg, ill-fitting dentures, urine leakage). ?Speak slowly, present only one idea at a time, and be patient when waiting for responses. ?Encourage physical activity and exercise. Even a daily walk can help prevent physical decline and improve behavioral problems. ?Consider respite care. Respite care can provide a needed break and give you a chance to recharge. This is offered in many communities in the form of in-home care or adult day care. Caregiving can be an all-consuming experience, and it's essential to take time for yourself, take care of your own medical problems, and arrange for breaks when you need them. ?See if your area has a support group for people caring for loved ones with dementia. It can help  to talk with other people who understand what you are going through.

## 2021-03-10 NOTE — Telephone Encounter (Signed)
[facility] no auth req sent to the scheduling-they will reach out to the patient to schedule

## 2021-03-11 ENCOUNTER — Telehealth: Payer: Self-pay | Admitting: *Deleted

## 2021-03-11 ENCOUNTER — Other Ambulatory Visit: Payer: Self-pay | Admitting: Psychiatry

## 2021-03-11 LAB — LIPID PANEL
Chol/HDL Ratio: 4.6 ratio — ABNORMAL HIGH (ref 0.0–4.4)
Cholesterol, Total: 189 mg/dL (ref 100–199)
HDL: 41 mg/dL (ref >39)
LDL Chol Calc (NIH): 123 mg/dL — ABNORMAL HIGH (ref 0–99)
Triglycerides: 138 mg/dL (ref 0–149)
VLDL Cholesterol Cal: 25 mg/dL (ref 5–40)

## 2021-03-11 LAB — VITAMIN B12: Vitamin B-12: 269 pg/mL (ref 232–1245)

## 2021-03-11 LAB — HEMOGLOBIN A1C
Est. average glucose Bld gHb Est-mCnc: 108 mg/dL
Hgb A1c MFr Bld: 5.4 % (ref 4.8–5.6)

## 2021-03-11 LAB — TSH: TSH: 2.78 u[IU]/mL (ref 0.450–4.500)

## 2021-03-11 MED ORDER — ATORVASTATIN CALCIUM 40 MG PO TABS: 40.0000 mg | ORAL_TABLET | Freq: Every day | ORAL | 2 refills | Status: DC

## 2021-03-11 NOTE — Telephone Encounter (Signed)
LVM for grand daughter, [name] requesting call back for lab results.

## 2021-03-12 NOTE — Telephone Encounter (Signed)
I called pt grand daughter again and left vm asking for CB to discuss results.  Genia Harold, MD  P Gna-Pod 3 Results Cholesterol level is high. I sent in a prescription for a cholesterol medication for her to take daily for stroke prevention. Her B12 level is also a little low which can cause memory issues. She can take over the counter B12 supplements (1000 mcg daily) for this

## 2021-03-13 ENCOUNTER — Telehealth: Payer: Self-pay

## 2021-03-13 NOTE — Telephone Encounter (Signed)
-----   Message from [name] Harold, MD sent at 03/11/2021 10:24 AM EST ----- Cholesterol level is high. I sent in a prescription for a cholesterol medication for her to take daily for stroke prevention. Her B12 level is also a little low which can cause memory issues. She can take over the counter B12 supplements (1000 mcg daily) for this

## 2021-03-13 NOTE — Telephone Encounter (Signed)
Contacted pt, spoke to daughter per DPR informed her that Cholesterol level is high. Dr Billey Gosling sent a prescription for a cholesterol medication for her to take daily for stroke prevention. Her B12 level is also a little low which can cause memory issues. She can take over the counter B12 supplements (1000 mcg daily) for this. Granddaughter understood and was appreciative of call. Advised to call the office with questions as she had none at this time.

## 2021-03-26 ENCOUNTER — Telehealth: Payer: Self-pay | Admitting: Family Medicine

## 2021-03-26 ENCOUNTER — Telehealth: Payer: Self-pay

## 2021-03-26 NOTE — Telephone Encounter (Signed)
[facility] faxed refill request for the following medications:  traZODone (DESYREL) 100 MG tablet   Please advise.

## 2021-03-31 ENCOUNTER — Telehealth: Payer: Self-pay | Admitting: Family Medicine

## 2021-03-31 DIAGNOSIS — I10 Essential (primary) hypertension: Secondary | ICD-10-CM

## 2021-03-31 MED ORDER — ENALAPRIL MALEATE 10 MG PO TABS: 10.0000 mg | ORAL_TABLET | Freq: Every day | ORAL | 0 refills | Status: DC

## 2021-03-31 NOTE — Telephone Encounter (Signed)
[facility] faxed refill request for the following medications:  enalapril (VASOTEC) 10 MG tablet   Please advise.

## 2021-04-23 NOTE — Telephone Encounter (Signed)
Rx Refill inquire

## 2021-05-06 ENCOUNTER — Other Ambulatory Visit: Payer: Self-pay

## 2021-05-06 ENCOUNTER — Ambulatory Visit (INDEPENDENT_AMBULATORY_CARE_PROVIDER_SITE_OTHER): Payer: PPO

## 2021-05-06 VITALS — BP 140/80 | HR 64 | Temp 97.9°F | Ht 61.0 in | Wt 120.4 lb

## 2021-05-06 DIAGNOSIS — Z0000 Encounter for general adult medical examination without abnormal findings: Secondary | ICD-10-CM | POA: Diagnosis not present

## 2021-05-06 DIAGNOSIS — Z23 Encounter for immunization: Secondary | ICD-10-CM

## 2021-05-06 NOTE — Progress Notes (Signed)
Subjective:   [name] is a 88 y.o. female who presents for Medicare Annual (Subsequent) preventive examination.  Review of Systems           Objective:    Today's Vitals   05/06/21 1101  BP: 140/80  Pulse: 64  Temp: 97.9 F (36.6 C)  TempSrc: Oral  SpO2: 98%  Weight: 120 lb 6.4 oz (54.6 kg)  Height: 5\' 1"  (1.549 m)   Body mass index is 22.75 kg/m.  Advanced Directives 11/22/2020 07/22/2020 07/21/2020 04/30/2020 11/30/2018 08/17/2017 07/06/2017  Does Patient Have a Medical Advance Directive? Yes - No Yes Yes Yes Yes  Type of Paramedic of [place];Living will - - [place];Living will [place];Living will [place];Living will [place];Living will  Does patient want to make changes to medical advance directive? - - - - - No - Patient declined -  Copy of [place] in Chart? Yes - validated most recent copy scanned in chart (See row information) - - No - copy requested No - copy requested No - copy requested No - copy requested  Would patient like information on creating a medical advance directive? - No - Patient declined - - - - -  Pre-existing out of facility DNR order (yellow form or pink MOST form) - - - - - - -    Current Medications (verified) Outpatient Encounter Medications as of 05/06/2021  Medication Sig   atorvastatin (LIPITOR) 40 MG tablet Take 1 tablet (40 mg total) by mouth daily.   acetaminophen (TYLENOL) 325 MG tablet Take 2 tablets (650 mg total) by mouth every 6 (six) hours as needed for mild pain (or Fever >/= 101).   enalapril (VASOTEC) 10 MG tablet Take 1 tablet (10 mg total) by mouth daily. Please schedule an office visit before anymore refills.   metoprolol tartrate (LOPRESSOR) 25 MG tablet Take 0.5 tablets (12.5 mg total) by mouth 2 (two) times daily.   torsemide (DEMADEX) 20 MG tablet Take 20 mg by mouth every morning.   No  facility-administered encounter medications on file as of 05/06/2021.    Allergies (verified) Bactrim [sulfamethoxazole-trimethoprim], Metronidazole, Nitrofurantoin, Nitrofurantoin monohyd macro, Penicillins, Zafirlukast, and Keflex [cephalexin]   History: Past Medical History:  Diagnosis Date   Breast cyst    removed   Diverticulitis    Gallstones    Hypertension    Low sodium levels    Past Surgical History:  Procedure Laterality Date   ABDOMINAL HYSTERECTOMY  2000   APPENDECTOMY  1940's   bladder tack  1990's   BREAST CYST INCISION AND DRAINAGE Left 1990's   CHOLECYSTECTOMY  09/11/13   Cholangiogram suggested a retained stone ductal dilatation.   ERCP W/ SPHICTEROTOMY  09/12/2013   Recurrent stone identified post cholecystectomy.   NASAL SINUS SURGERY     Family History  Problem Relation Age of Onset   Hypertension Father    CAD Father    Cancer Brother        unknown type   Social History   Socioeconomic History   Marital status: Widowed    Spouse name: Not on file   Number of children: 1   Years of education: Not on file   Highest education level: 12th grade  Occupational History   Occupation: retired  Tobacco Use   Smoking status: Never   Smokeless tobacco: Never  Vaping Use   Vaping Use: Never used  Substance and  Sexual Activity   Alcohol use: No   Drug use: No   Sexual activity: Not Currently  Other Topics Concern   Not on file  Social History Narrative   Not on file   Social Determinants of Health   Financial Resource Strain: Low Risk    Difficulty of Paying Living Expenses: Not hard at all  Food Insecurity: No Food Insecurity   Worried About Running Out of Food in the Last Year: Never true   [name] in the Last Year: Never true  Transportation Needs: No Transportation Needs   Lack of Transportation (Medical): No   Lack of Transportation (Non-Medical): No  Physical Activity: Inactive   Days of Exercise per Week: 0 days   Minutes of  Exercise per Session: 0 min  Stress: No Stress Concern Present   Feeling of Stress : Not at all  Social Connections: Socially Isolated   Frequency of Communication with Friends and Family: More than three times a week   Frequency of Social Gatherings with Friends and Family: More than three times a week   Attends Religious Services: Never   Marine scientist or Organizations: No   Attends Archivist Meetings: Never   Marital Status: Widowed    Tobacco Counseling Counseling given: Not Answered   Clinical Intake:  Pre-visit preparation completed: Yes  Pain : No/denies pain     Nutritional Risks: None Diabetes: No  How often do you need to have someone help you when you read instructions, pamphlets, or other written materials from your doctor or pharmacy?: 1 - Never  Diabetic?no  Interpreter Needed?: No  Information entered by :: [name] Shaggy, LPN   Activities of Daily Living In your present state of health, do you have any difficulty performing the following activities: 11/22/2020 07/22/2020  Hearing? [name]  Vision? N N  Difficulty concentrating or making decisions? Y Y  Comment - forgetful  Walking or climbing stairs? Y Y  Dressing or bathing? Y N  Doing errands, shopping? Y Y  Some recent data might be hidden    Patient Care Team: Chrismon, Vickki Muff, PA-C (Inactive) as PCP - General (Family Medicine) Murlean Iba, MD (Nephrology)  Indicate any recent Medical Services you may have received from other than [facility] providers in the past year (date may be approximate).     Assessment:   This is a routine wellness examination for [name].  Hearing/Vision screen No results found.  Dietary issues and exercise activities discussed:     Goals Addressed             This Visit's Progress    DIET - EAT MORE FRUITS AND VEGETABLES         Depression Screen PHQ 2/9 Scores 05/06/2021 04/30/2020 11/30/2018 07/06/2017 07/14/2016 07/14/2016 07/24/2015  PHQ -  2 Score 0 0 0 0 0 0 0  PHQ- 9 Score - - - - 1 - -    Fall Risk Fall Risk  04/30/2020 11/30/2018 07/06/2017 07/14/2016 07/24/2015  Falls in the past year? 0 0 No Yes Yes  Number falls in past yr: 0 - - 1 1  Injury with Fall? 0 - - Yes No  Comment - - - staples in head on 07/21/15 -  Risk for fall due to : - - - - History of fall(s)  Follow up - - - - Falls prevention discussed    FALL RISK PREVENTION PERTAINING TO THE HOME:  Any stairs in or around  the home? No  If so, are there any without handrails? No  Home free of loose throw rugs in walkways, pet beds, electrical cords, etc? Yes  Adequate lighting in your home to reduce risk of falls? Yes   ASSISTIVE DEVICES UTILIZED TO PREVENT FALLS:  Life alert? Yes  Use of a cane, walker or w/c? Yes  Grab bars in the bathroom? No  Shower chair or bench in shower? Yes  Elevated toilet seat or a handicapped toilet? Yes   TIMED UP AND GO:  Was the test performed? Yes .  Length of time to ambulate 10 feet: 7 sec.   Gait slow and steady with assistive device  Cognitive Function: MMSE - Mini Mental State Exam 03/10/2021  Orientation to time 2  Orientation to Place 4  Registration 3  Attention/ Calculation 3  Recall 0  Language- name 2 objects 2  Language- repeat 1  Language- follow 3 step command 3  Language- read & follow direction 1  Write a sentence 1  Copy design 0  Total score 20     6CIT Screen 07/06/2017 07/14/2016  What Year? 0 points 4 points  What month? 0 points 0 points  What time? 0 points 0 points  Count back from 20 0 points 0 points  Months in reverse 0 points 0 points  Repeat phrase 6 points 6 points  Total Score 6 10    Immunizations Immunization History  Administered Date(s) Administered   Fluad Quad(high Dose 65+) 02/01/2020   Influenza Split 01/13/2008, 01/25/2012   Influenza, High Dose Seasonal PF 01/13/2017, 01/04/2018   Influenza-Unspecified 12/12/2013   PFIZER Comirnaty(Gray Top)Covid-19 Tri-Sucrose  Vaccine 07/24/2020   Pneumococcal Conjugate-13 03/13/2015   Pneumococcal Polysaccharide-23 04/09/2006   Zoster, Live 04/23/2006    TDAP status: Due, Education has been provided regarding the importance of this vaccine. Advised may receive this vaccine at local pharmacy or Health Dept. Aware to provide a copy of the vaccination record if obtained from local pharmacy or Health Dept. Verbalized acceptance and understanding.  Flu Vaccine status: Completed at today's visit  Pneumococcal vaccine status: Up to date  Covid-19 vaccine status: Completed vaccines  Qualifies for Shingles Vaccine? Yes   Zostavax completed Yes   Shingrix Completed?: No.    Education has been provided regarding the importance of this vaccine. Patient has been advised to call insurance company to determine out of pocket expense if they have not yet received this vaccine. Advised may also receive vaccine at local pharmacy or Health Dept. Verbalized acceptance and understanding.  Screening Tests Health Maintenance  Topic Date Due   TETANUS/TDAP  Never done   Zoster Vaccines- Shingrix (1 of 2) Never done   DEXA SCAN  02/17/2014   COVID-19 Vaccine (2 - Pfizer series) 08/14/2020   INFLUENZA VACCINE  11/11/2020   Pneumonia Vaccine 48+ Years old  Completed   HPV VACCINES  Aged Out    Health Maintenance  Health Maintenance Due  Topic Date Due   TETANUS/TDAP  Never done   Zoster Vaccines- Shingrix (1 of 2) Never done   DEXA SCAN  02/17/2014   COVID-19 Vaccine (2 - Pfizer series) 08/14/2020   INFLUENZA VACCINE  11/11/2020    Colorectal cancer screening: No longer required.   Mammogram status: No longer required due to age.  Lung Cancer Screening: (Low Dose CT Chest recommended if Age 77-80 years, 30 pack-year currently smoking OR have quit w/in 15years.) does not qualify.    Additional Screening:  Hepatitis C  Screening: does not qualify; Completed no  Vision Screening: Recommended annual ophthalmology exams  for early detection of glaucoma and other disorders of the eye. Is the patient up to date with their annual eye exam?  Yes  Who is the provider or what is the name of the office in which the patient attends annual eye exams? MD in [place] If pt is not established with a provider, would they like to be referred to a provider to establish care? No .   Dental Screening: Recommended annual dental exams for proper oral hygiene  Community Resource Referral / Chronic Care Management: CRR required this visit?  No   CCM required this visit?  No      Plan:     I have personally reviewed and noted the following in the patients chart:   Medical and social history Use of alcohol, tobacco or illicit drugs  Current medications and supplements including opioid prescriptions.  Functional ability and status Nutritional status Physical activity Advanced directives List of other physicians Hospitalizations, surgeries, and ER visits in previous 12 months Vitals Screenings to include cognitive, depression, and falls Referrals and appointments  In addition, I have reviewed and discussed with patient certain preventive protocols, quality metrics, and best practice recommendations. A written personalized care plan for preventive services as well as general preventive health recommendations were provided to patient.     Dionisio David, LPN   3/81/7711   Nurse Notes: none

## 2021-05-09 ENCOUNTER — Other Ambulatory Visit: Payer: Self-pay | Admitting: Family Medicine

## 2021-05-09 DIAGNOSIS — I10 Essential (primary) hypertension: Secondary | ICD-10-CM

## 2021-05-09 NOTE — Telephone Encounter (Signed)
Requested Prescriptions  Pending Prescriptions Disp Refills   enalapril (VASOTEC) 10 MG tablet [Pharmacy Med Name: ENALAPRIL MALEATE 10 MG TAB] 90 tablet 0    Sig: TAKE 1 TABLET (10 MG TOTAL) BY MOUTH DAILY. PLEASE SCHEDULE AN OFFICE VISIT BEFORE ANYMORE REFILLS.     Cardiovascular:  ACE Inhibitors Failed - 05/09/2021 10:30 AM      Failed - Cr in normal range and within 180 days    Creat  Date Value Ref Range Status  02/05/2017 0.84 0.60 - 0.88 mg/dL Final    Comment:    For patients >15 years of age, the reference limit for Creatinine is approximately 13% higher for people identified as African-American. .    Creatinine, Ser  Date Value Ref Range Status  11/25/2020 1.69 (H) 0.44 - 1.00 mg/dL Final   Creatinine, Urine  Date Value Ref Range Status  01/14/2017 48 mg/dL Final  01/14/2017 47 mg/dL Final         Failed - Last BP in normal range    BP Readings from Last 1 Encounters:  05/06/21 140/80         Passed - K in normal range and within 180 days    Potassium  Date Value Ref Range Status  11/25/2020 4.6 3.5 - 5.1 mmol/L Final  09/13/2013 3.7 3.5 - 5.1 mmol/L Final         Passed - Patient is not pregnant      Passed - Valid encounter within last 6 months    Recent Outpatient Visits          8 months ago [place], PA-C   10 months ago Insomnia, unspecified type   [facility], Kelby Aline, [place]   1 year ago Primary insomnia   [facility], Vickki Muff, PA-C   1 year ago Essential (primary) hypertension   [facility], Vickki Muff, PA-C   3 years ago Essential (primary) hypertension   [facility], Vickki Muff, PA-C      Future Appointments            In 2 weeks Jerrol Banana., MD [facility], [place]

## 2021-05-22 ENCOUNTER — Ambulatory Visit (INDEPENDENT_AMBULATORY_CARE_PROVIDER_SITE_OTHER): Payer: PPO | Admitting: Physician Assistant

## 2021-05-22 ENCOUNTER — Other Ambulatory Visit: Payer: Self-pay

## 2021-05-22 ENCOUNTER — Encounter: Payer: Self-pay | Admitting: Physician Assistant

## 2021-05-22 VITALS — BP 126/76 | HR 62 | Temp 97.9°F | Resp 16 | Wt 121.0 lb

## 2021-05-22 DIAGNOSIS — Z538 Procedure and treatment not carried out for other reasons: Secondary | ICD-10-CM

## 2021-05-22 NOTE — Progress Notes (Deleted)
      Established patient visit   Patient: [name]   DOB: December 03, 1924   88 y.o. Female  MRN: [id number] Visit Date: 05/22/2021  Today's healthcare provider: Almon Register, PA-C   Chief Complaint  Patient presents with   [facility] form   Subjective    HPI  Patient here today to have an FL2 form fill out.  Medications: Outpatient Medications Prior to Visit  Medication Sig   acetaminophen (TYLENOL) 325 MG tablet Take 2 tablets (650 mg total) by mouth every 6 (six) hours as needed for mild pain (or Fever >/= 101).   enalapril (VASOTEC) 10 MG tablet TAKE 1 TABLET (10 MG TOTAL) BY MOUTH DAILY. PLEASE SCHEDULE AN OFFICE VISIT BEFORE ANYMORE REFILLS.   metoprolol tartrate (LOPRESSOR) 25 MG tablet Take 0.5 tablets (12.5 mg total) by mouth 2 (two) times daily.   torsemide (DEMADEX) 20 MG tablet Take 20 mg by mouth every morning.   atorvastatin (LIPITOR) 40 MG tablet Take 1 tablet (40 mg total) by mouth daily.   No facility-administered medications prior to visit.    Review of Systems  {Labs  Heme  Chem  Endocrine  Serology  Results Review (optional):23779}   Objective    BP 126/76 (BP Location: Left Arm, Patient Position: Sitting, Cuff Size: Normal)   Pulse 62   Temp 97.9 F (36.6 C) (Oral)   Resp 16   Wt 121 lb (54.9 kg)   BMI 22.86 kg/m  {Show previous vital signs (optional):23777}  Physical Exam  ***  No results found for any visits on 05/22/21.  Assessment & Plan     ***  No follow-ups on file.      {provider attestation***:1}   Larey Seat  [facility] [phone] (phone) [phone] (fax)  [place]

## 2021-05-23 ENCOUNTER — Telehealth (INDEPENDENT_AMBULATORY_CARE_PROVIDER_SITE_OTHER): Payer: PPO | Admitting: Physician Assistant

## 2021-05-23 ENCOUNTER — Encounter: Payer: Self-pay | Admitting: Physician Assistant

## 2021-05-23 DIAGNOSIS — K648 Other hemorrhoids: Secondary | ICD-10-CM

## 2021-05-23 DIAGNOSIS — R413 Other amnesia: Secondary | ICD-10-CM

## 2021-05-23 DIAGNOSIS — N179 Acute kidney failure, unspecified: Secondary | ICD-10-CM

## 2021-05-23 DIAGNOSIS — I10 Essential (primary) hypertension: Secondary | ICD-10-CM

## 2021-05-23 DIAGNOSIS — N184 Chronic kidney disease, stage 4 (severe): Secondary | ICD-10-CM

## 2021-05-23 DIAGNOSIS — M8000XD Age-related osteoporosis with current pathological fracture, unspecified site, subsequent encounter for fracture with routine healing: Secondary | ICD-10-CM

## 2021-05-23 DIAGNOSIS — I5032 Chronic diastolic (congestive) heart failure: Secondary | ICD-10-CM

## 2021-05-23 NOTE — Assessment & Plan Note (Signed)
Chronic, historic condition Currently stable Continue current medications

## 2021-05-23 NOTE — Assessment & Plan Note (Signed)
Newly developed per lab result history  Recommend repeat lab work at next apt to confirm results Potential referral to nephrology may be needed per patient and family requests Advise to avoid NSAIDs and stay well hydrated at this time.

## 2021-05-23 NOTE — Assessment & Plan Note (Signed)
Chronic, historic concern likely stable at this time Appears that patient likely has mild cognitive impairment  Recommend MMSE for monitoring  Recommend assistance with ADLs and medications for consistency and safety.  Follow up as needed

## 2021-05-23 NOTE — Assessment & Plan Note (Signed)
Chronic, historic condition, stable and well managed with medications Continue current medications

## 2021-05-23 NOTE — Assessment & Plan Note (Signed)
Chronic, historic condition Currently stable Continue current measures

## 2021-05-23 NOTE — Assessment & Plan Note (Signed)
Chronic, historic condition Continue current management with OTC care Follow up as needed for symptoms or progression

## 2021-05-23 NOTE — Progress Notes (Signed)
MyChart Video Visit    Virtual Visit via Video Note   This visit type was conducted due to national recommendations for restrictions regarding the COVID-19 Pandemic (e.g. social distancing) in an effort to limit this patient's exposure and mitigate transmission in our community. This patient is at least at moderate risk for complications without adequate follow up. This format is felt to be most appropriate for this patient at this time. Physical exam was limited by quality of the video and audio technology used for the visit.   Patient location: [place] speaking with patient's daughter who is her POA  Provider location: [facility]   I discussed the limitations of evaluation and management by telemedicine and the availability of in person appointments. The patient expressed understanding and agreed to proceed.  Patient: [name]   DOB: 28-Oct-1924   88 y.o. Female  MRN: [id number] Visit Date: 05/23/2021  Today's healthcare provider: Dani Gobble Cyd Hostler, PA-C  Introduced myself to the patient as a Journalist, newspaper and provided education on APPs in clinical practice.   Chief Complaint  Patient presents with   FL2 Form   Subjective    HPI  Patient's daughter wishes to speak about FL2 form and coordination of healthcare needs at home Patient's daughter states that the patient will need assistance with ADLs of bathing, dressing and nutrition along with medication administration Discussed current management for her chronic conditions and confirmed current medication list    Medications: Outpatient Medications Prior to Visit  Medication Sig   acetaminophen (TYLENOL) 325 MG tablet Take 2 tablets (650 mg total) by mouth every 6 (six) hours as needed for mild pain (or Fever >/= 101).   enalapril (VASOTEC) 10 MG tablet TAKE 1 TABLET (10 MG TOTAL) BY MOUTH DAILY. PLEASE SCHEDULE AN OFFICE VISIT BEFORE ANYMORE REFILLS.   metoprolol tartrate (LOPRESSOR) 25 MG tablet Take 0.5  tablets (12.5 mg total) by mouth 2 (two) times daily.   torsemide (DEMADEX) 20 MG tablet Take 20 mg by mouth every morning.   [DISCONTINUED] atorvastatin (LIPITOR) 40 MG tablet Take 1 tablet (40 mg total) by mouth daily. (Patient not taking: Reported on 05/23/2021)   No facility-administered medications prior to visit.    Review of Systems  All other systems reviewed and are negative.     Objective    There were no vitals taken for this visit.   Physical Exam  Physical exam not performed due to nature of virtual visit and speaking with patient's daughter   Assessment & Plan     Problem List Items Addressed This Visit       Cardiovascular and Mediastinum   Internal hemorrhoid    Chronic, historic condition Continue current management with OTC care Follow up as needed for symptoms or progression       Essential hypertension    Chronic, historic condition, stable and well managed with medications Continue current medications       Chronic diastolic CHF (congestive heart failure) (HCC)    Chronic, historic condition Currently stable Continue current medications         Musculoskeletal and Integument   Age-related osteoporosis with current pathological fracture with routine healing    Chronic, historic condition Currently stable Continue current measures         Genitourinary   Acute renal failure superimposed on stage 4 chronic kidney disease ([place])    Newly developed per lab result history  Recommend repeat lab work at next apt to confirm results  Potential referral to nephrology may be needed per patient and family requests Advise to avoid NSAIDs and stay well hydrated at this time.         Other   Loss of memory - Primary    Chronic, historic concern likely stable at this time Appears that patient likely has mild cognitive impairment  Recommend MMSE for monitoring  Recommend assistance with ADLs and medications for consistency and safety.  Follow up  as needed        No follow-ups on file.   I, Kayzlee Wirtanen E Brittania Sudbeck, PA-C, have reviewed all documentation for this visit. The documentation on 05/23/21 for the exam, diagnosis, procedures, and orders are all accurate and complete.   Hjalmer Iovino, [place]ie [place] MPH [facility]    No follow-ups on file.     I discussed the assessment and treatment plan with the patient. The patient was provided an opportunity to ask questions and all were answered. The patient agreed with the plan and demonstrated an understanding of the instructions.   The patient was advised to call back or seek an in-person evaluation if the symptoms worsen or if the condition fails to improve as anticipated.  I provided 31 minutes of non-face-to-face time during this encounter.    Almon Register, PA-C [employer] [phone] (phone) [phone] (fax)  [place]

## 2021-05-29 ENCOUNTER — Ambulatory Visit: Payer: PPO | Admitting: Family Medicine

## 2021-06-11 NOTE — Addendum Note (Signed)
Addended by: Dionisio David on: 06/11/2021 04:37 PM   Modules accepted: Orders

## 2021-06-12 ENCOUNTER — Telehealth: Payer: Self-pay

## 2021-06-12 NOTE — Telephone Encounter (Signed)
Copied from [place] [id number]. Topic: General - Other ?>> Jun 12, 2021 11:37 AM McGill, Nelva Bush wrote: ?Reason for CRM: Pt Granddaughter stated pt had an appointment Feb 10th PA sent referral for home health and information was going to be mailed to her but stated she has not received anything and is very much in need of the services. ? ?[name] is requesting a call back as soon as possible to discuss referral. ?

## 2021-06-13 NOTE — Telephone Encounter (Signed)
[name] called back and relayed message FL2 mailed on 06/12/2021, per Junie Panning notes. ?Robin verbalized understanding. ?

## 2021-06-13 NOTE — Telephone Encounter (Signed)
Called Robin no answer.Mailbox is full. ?If patient's granddaughter calls back ok for [facility] Nurse to relay message. ?

## 2021-08-10 ENCOUNTER — Other Ambulatory Visit: Payer: Self-pay | Admitting: Family Medicine

## 2021-08-10 DIAGNOSIS — I10 Essential (primary) hypertension: Secondary | ICD-10-CM

## 2021-08-18 ENCOUNTER — Encounter: Payer: Self-pay | Admitting: Physician Assistant

## 2021-08-18 ENCOUNTER — Ambulatory Visit (INDEPENDENT_AMBULATORY_CARE_PROVIDER_SITE_OTHER): Payer: PPO | Admitting: Physician Assistant

## 2021-08-18 VITALS — BP 142/72 | HR 58 | Temp 97.6°F | Resp 14 | Wt 129.3 lb

## 2021-08-18 DIAGNOSIS — N184 Chronic kidney disease, stage 4 (severe): Secondary | ICD-10-CM | POA: Diagnosis not present

## 2021-08-18 DIAGNOSIS — M8000XD Age-related osteoporosis with current pathological fracture, unspecified site, subsequent encounter for fracture with routine healing: Secondary | ICD-10-CM | POA: Diagnosis not present

## 2021-08-18 DIAGNOSIS — N179 Acute kidney failure, unspecified: Secondary | ICD-10-CM | POA: Diagnosis not present

## 2021-08-18 DIAGNOSIS — K644 Residual hemorrhoidal skin tags: Secondary | ICD-10-CM

## 2021-08-18 DIAGNOSIS — R413 Other amnesia: Secondary | ICD-10-CM | POA: Diagnosis not present

## 2021-08-18 DIAGNOSIS — N390 Urinary tract infection, site not specified: Secondary | ICD-10-CM | POA: Diagnosis not present

## 2021-08-18 DIAGNOSIS — I5032 Chronic diastolic (congestive) heart failure: Secondary | ICD-10-CM | POA: Diagnosis not present

## 2021-08-18 DIAGNOSIS — K648 Other hemorrhoids: Secondary | ICD-10-CM

## 2021-08-18 DIAGNOSIS — R319 Hematuria, unspecified: Secondary | ICD-10-CM

## 2021-08-18 LAB — POCT URINALYSIS DIPSTICK
Bilirubin, UA: NEGATIVE
Glucose, UA: NEGATIVE
Ketones, UA: NEGATIVE
Nitrite, UA: NEGATIVE
Protein, UA: NEGATIVE
Spec Grav, UA: 1.025 (ref 1.010–1.025)
Urobilinogen, UA: 1 E.U./dL
pH, UA: 5.5 (ref 5.0–8.0)

## 2021-08-18 MED ORDER — ZINC OXIDE 20 % EX OINT: 1.0000 "application " | TOPICAL_OINTMENT | CUTANEOUS | 0 refills | Status: DC | PRN

## 2021-08-18 MED ORDER — DOCUSATE SODIUM 50 MG PO CAPS: 50.0000 mg | ORAL_CAPSULE | Freq: Two times a day (BID) | ORAL | 0 refills | Status: DC

## 2021-08-18 MED ORDER — DIBUCAINE (PERIANAL) 1 % EX OINT: 1.0000 "application " | TOPICAL_OINTMENT | CUTANEOUS | 0 refills | Status: DC | PRN

## 2021-08-18 MED ORDER — PHENYLEPHRINE-MINERAL OIL-PET 0.25-14-74.9 % RE OINT: 1.0000 "application " | TOPICAL_OINTMENT | Freq: Two times a day (BID) | RECTAL | 0 refills | Status: DC | PRN

## 2021-08-18 NOTE — Progress Notes (Signed)
?  ? ?I,Wendall Isabell Robinson,acting as a Education administrator for [employer], PA-C.,have documented all relevant documentation on the behalf of Mardene Speak, PA-C,as directed by  [employer], PA-C while in the presence of [employer], PA-C. ? ? ?Established patient visit ? ? ?Patient: [name]   DOB: [birth year]   88 y.o. Female  MRN: [id number] ?Visit Date: 08/18/2021 ? ?Today's healthcare provider: Mardene Speak, PA-C  ? ?Chief Complaint  ?Patient presents with  ? Hemorrhoids  ? ?Subjective  ?  ?Patient presents for hemorrhoid/external 1 mo. Reports no pain, itching or irritation but patient worries that it's unclean and feces sticks to it.   Using OTC hemorrhoid suppositories. ?Per her caregiver, she has pain, irritation with every bowel movement. ? ?HEMORRHOIDS ?Duration: a month ?Perianal itching/irritation: no ?Perianal pain: no ?Bright red rectal bleeding: no ?Amount of blood: browinsh yellowish drainage ?Frequency: "more diarrhea vs constipation/1-2 bowel movements per day" ?Constipation: yes ?Hard stools: yes ?Treatments attempted: suppositories  ?Previous hemorrhoids: yes  ?Colonoscopy: no longer required ? ?Medications: ?Outpatient Medications Prior to Visit  ?Medication Sig  ? acetaminophen (TYLENOL) 325 MG tablet Take 2 tablets (650 mg total) by mouth every 6 (six) hours as needed for mild pain (or Fever >/= 101).  ? enalapril (VASOTEC) 10 MG tablet Take 1 tablet (10 mg total) by mouth daily. Please schedule an office visit before anymore refills.  ? metoprolol tartrate (LOPRESSOR) 25 MG tablet Take 0.5 tablets (12.5 mg total) by mouth 2 (two) times daily.  ? torsemide (DEMADEX) 20 MG tablet Take 20 mg by mouth every morning.  ? ?No facility-administered medications prior to visit.  ? ? ?Review of Systems  ?Respiratory: Negative.    ?Cardiovascular: Negative.  Negative for leg swelling.  ?Gastrointestinal: Negative.   ?Psychiatric/Behavioral:  Positive for dysphoric mood.   ?Except see HPI ? ?  Objective  ?  ?BP  (!) 142/72 (BP Location: Right Arm, Patient Position: Sitting, Cuff Size: Normal)   Pulse (!) 58   Temp 97.6 ?F (36.4 ?C) (Oral)   Resp 14   Wt 129 lb 4.8 oz (58.7 kg)   SpO2 100%   BMI 24.43 kg/m?  ? ? ?Physical Exam ?Vitals and nursing note reviewed.  ?Constitutional:   ?   General: She is in acute distress.  ?   Appearance: She is well-developed.  ?HENT:  ?   Head: Normocephalic and atraumatic.  ?Cardiovascular:  ?   Rate and Rhythm: Normal rate and regular rhythm.  ?Pulmonary:  ?   Effort: Pulmonary effort is normal.  ?   Breath sounds: Normal breath sounds.  ?Abdominal:  ?   General: Bowel sounds are normal.  ?   Palpations: Abdomen is soft.  ?Genitourinary: ?   Comments: External hemorrhoids, a slightly erythematous area of irritation - perianal, perineal, vulvar  contaminated with feces ?Neurological:  ?   Mental Status: She is alert.  ?Psychiatric:     ?   Mood and Affect: Mood is depressed.     ?   Speech: Speech normal.     ?   Behavior: Behavior is agitated.  ?  ? ?No results found for any visits on 08/18/21. ? Assessment & Plan  ?  ?1. UTI symptoms ?Urinary frequency, irritation ?- Urine Culture positive for blood and leukocytes ?- Urinalysis, Routine w reflex microscopic ?- POCT Urinalysis Dipstick ?Deferred treatment until a regimen can be chosen based on susceptibility results. Patient has known hx of allergy to abx ?Hx of Stage 4 chronic  kidney disease, unspecified acute renal failure type  ? ?2. Internal hemorrhoids ?3. External hemorrhoids ?X 49mo, patient requested a referral to GI ?- dibucaine (NUPERCAINAL) 1 % OINT; Place 1 application. rectally as needed for hemorrhoids.  Dispense: 28 g; Refill: 0 ?Local anesthetics for relief of pain and itch; applied to perianal area (not inserted into rectum) ?- docusate sodium (COLACE) 50 MG capsule; Take 1 capsule (50 mg total) by mouth 2 (two) times daily.  Dispense: 10 capsule; Refill: 0 ?To soften stool ?- zinc oxide (MEIJER ZINC OXIDE) 20 %  ointment; Apply 1 application. topically as needed for irritation.  Dispense: 56.7 g; Refill: 0 ?- phenylephrine-shark liver oil-mineral oil-petrolatum (PREPARATION H) 0.25-14-74.9 % rectal ointment; Place 1 application. rectally 2 (two) times daily as needed for hemorrhoids.  Dispense: 28 g; Refill: 0 ?to shrink hemorrhoids and ameliorate bleeding, pain, itch ? ?Dietary modification with adequate fluid (generally ?2 L water per day) and high fiber (25 to 35 g/day). Fiber supplementation (psillium husk) to complement diet ?Fiber supplementation helps relieve overall symptoms and bleeding ?Sitz bath with warm water for pain relief ?Witch hazel solution, wipes, pads (Preparation H, Tucks) after stooling! ? ?FU in 1 mo for chronic conditions with her PCP ?The patient was advised to call back or seek an in-person evaluation if the symptoms worsen or if the condition fails to improve as anticipated. ? ?I discussed the assessment and treatment plan with the patient. The patient was provided an opportunity to ask questions and all were answered. The patient agreed with the plan and demonstrated an understanding of the instructions. ? ?The entirety of the information documented in the History of Present Illness, Review of Systems and Physical Exam were personally obtained by me. Portions of this information were initially documented by the CMA and reviewed by me for thoroughness and accuracy.  ?Portions of this note were created using dictation software and may contain typographical errors.  ?  ? Total encounter time more than 30 minutes ? Greater than 50% was spent in counseling and coordination of care with the patient  ? ?Mardene Speak, PA-C  ?[place] ?[phone] (phone) ?[phone] (fax) ? ?[facility] ?

## 2021-08-19 ENCOUNTER — Other Ambulatory Visit: Payer: Self-pay | Admitting: Physician Assistant

## 2021-08-19 ENCOUNTER — Telehealth: Payer: Self-pay | Admitting: Physician Assistant

## 2021-08-19 ENCOUNTER — Telehealth: Payer: Self-pay

## 2021-08-19 DIAGNOSIS — I10 Essential (primary) hypertension: Secondary | ICD-10-CM

## 2021-08-19 LAB — URINALYSIS, ROUTINE W REFLEX MICROSCOPIC
Bilirubin, UA: NEGATIVE
Glucose, UA: NEGATIVE
Ketones, UA: NEGATIVE
Nitrite, UA: POSITIVE — AB
Specific Gravity, UA: 1.018 (ref 1.005–1.030)
Urobilinogen, Ur: 0.2 mg/dL (ref 0.2–1.0)
pH, UA: 7 (ref 5.0–7.5)

## 2021-08-19 LAB — SPECIMEN STATUS REPORT

## 2021-08-19 LAB — MICROSCOPIC EXAMINATION
Casts: NONE SEEN /lpf
RBC, Urine: >30 /hpf — AB (ref 0–2)
WBC, UA: >30 /hpf — AB (ref 0–5)

## 2021-08-19 MED ORDER — CIPROFLOXACIN HCL 250 MG PO TABS: 250.0000 mg | ORAL_TABLET | Freq: Two times a day (BID) | ORAL | 0 refills | Status: AC

## 2021-08-19 NOTE — Telephone Encounter (Signed)
Copied from [place] [id number]. Topic: General - Other ?>> Aug 19, 2021 11:48 AM Tessa Lerner A wrote: ?Reason for CRM: Medication Refill - Medication: metoprolol tartrate (LOPRESSOR) 25 MG tablet [[id number]]  - patient has 0 tablets remaining  ? ?Has the patient contacted their pharmacy? Yes.  The patient's family member has been directed to contact their PCP ?(Agent: If no, request that the patient contact the pharmacy for the refill. If patient does not wish to contact the pharmacy document the reason why and proceed with request.) ?(Agent: If yes, when and what did the pharmacy advise?) ? ?Preferred Pharmacy (with phone number or street name): [id number] - [place], [address] ?[address] [place] [address] ?Phone: [phone] Fax: [phone] ?Hours: Not open 24 hours ? ?Has the patient been seen for an appointment in the last year OR does the patient have an upcoming appointment? Yes.   ? ?Agent: Please be advised that RX refills may take up to 3 business days. We ask that you follow-up with your pharmacy. ?

## 2021-08-19 NOTE — Telephone Encounter (Signed)
[facility] ref[facility] request for the following medications: ? ?metoprolol tartrate (LOPRESSOR) 25 MG tablet  ? ?Please advise. ? ?

## 2021-08-19 NOTE — Telephone Encounter (Signed)
Called /spoke with patient's granddaughter. ?Explained diagnoses and treatment plan. Discussed a few options for antibiotic. Patient has an allergy to multiple antibiotics. Confirmed medications that were sent to pharmacy. Explained that they could be available OTC.  ?Recommended to set up a fu appt with me on this Friday or Next week ?FU for chronic conditions was scheduled with Mikey Kirschner. ?

## 2021-08-19 NOTE — Telephone Encounter (Signed)
Copied from [place] [id number]. Topic: General - Other ?>> Aug 19, 2021  8:45 AM Valere Dross wrote: ?Reason for CRM: Pt called in returning Provider [place] call, but she was with a pt and requested to get a call back, please advise. ?

## 2021-08-20 MED ORDER — METOPROLOL TARTRATE 25 MG PO TABS: 12.5000 mg | ORAL_TABLET | Freq: Two times a day (BID) | ORAL | 0 refills | Status: DC

## 2021-08-20 NOTE — Telephone Encounter (Signed)
Requested Prescriptions  ?Pending Prescriptions Disp Refills  ?? metoprolol tartrate (LOPRESSOR) 25 MG tablet 180 tablet 2  ?  Sig: Take 0.5 tablets (12.5 mg total) by mouth 2 (two) times daily.  ?  ? Cardiovascular:  Beta Blockers Failed - 08/19/2021  2:03 PM  ?  ?  Failed - Last BP in normal range  ?  BP Readings from Last 1 Encounters:  ?08/18/21 (!) 142/72  ?   ?  ?  Passed - Last Heart Rate in normal range  ?  Pulse Readings from Last 1 Encounters:  ?08/18/21 (!) 58  ?   ?  ?  Passed - Valid encounter within last 6 months  ?  Recent Outpatient Visits   ?      ? 2 days ago Acute renal failure superimposed on stage 4 chronic kidney disease, unspecified acute renal failure type ([place])  ? [facility] [place], [place], PA-C  ? 2 months ago Loss of memory  ? [place], PA-C  ? 3 months ago   ? [employer], Erin E, PA-C  ? 11 months ago Bad memory  ? [place], PA-C  ? 1 year ago Insomnia, unspecified type  ? [place], FNP  ?  ?  ?Future Appointments   ?        ? In 4 weeks Mikey Kirschner, PA-C [facility]  ?  ? ?  ?  ?  ? ?

## 2021-08-21 LAB — URINE CULTURE

## 2021-08-21 LAB — SPECIMEN STATUS REPORT

## 2021-08-21 NOTE — Progress Notes (Signed)
?  ? ? ?Established patient visit ? ? ?Patient: [name]   DOB: [birth year]   88 y.o. Female  MRN: [id number] ?Visit Date: 08/22/2021 ? ?Today's healthcare provider: Mardene Speak, PA-C  ? ?I,Tiffany J Bragg,acting as a Education administrator for [employer], PA-C.,have documented all relevant documentation on the behalf of Mardene Speak, PA-C,as directed by  [employer], PA-C while in the presence of [employer], PA-C.  ? ?CC: follow up UTI symptoms and hemorrhoid ? ?Subjective  ?  ?HPI  ?Follow up for hemorrhoids ? ?The patient was last seen for this 4 days ago. ?Changes made at last visit include available meds that the pharmacy had have been taken, (antibiotics and creams). ?She reports excellent compliance with treatment. ?She feels that condition is Worse. ?She is not having side effects.  ?Inconsistent complains of worsening symptoms and continued bleeding and pain: at first she insists that she has pain and bleeding , then she denies having any rectal pain and bleeding. ? ?-----------------------------------------------------------------------------------------  ? ?Patient is accompanied by her granddaughter who shares that patient has signs of early Alzheimer.  Per chart review, patient was seen by Neurology on 03/10/22. Her MMSE was 20, consistent with a mild dementia.  ?Patient refused to take donepezil. MRI findings showed remote strokes.  ?Patient was Rxed B12 supplements and cholesterol medication for memory issues and stroke prevention.  ?Patient eats "microwaved food and sandwiches. Granddaughter has unplugged her stove as she had left the stove on all day previously." ? ?Per patient, she has been properly cleaning herself after each defecation and urination. ? ?She endorses occasional urinary incontinence and some rectal discharge . She seems upset that she has some type of discharge but unclear if it is due to her using an ointments for hemorrhoids or something else.  ? ?Patient lives alone. ?Has a  caregiver ? ?Medications: ?Outpatient Medications Prior to Visit  ?Medication Sig  ? acetaminophen (TYLENOL) 325 MG tablet Take 2 tablets (650 mg total) by mouth every 6 (six) hours as needed for mild pain (or Fever >/= 101).  ? ciprofloxacin (CIPRO) 250 MG tablet Take 1 tablet (250 mg total) by mouth 2 (two) times daily for 3 days.  ? dibucaine (NUPERCAINAL) 1 % OINT Place 1 application. rectally as needed for hemorrhoids.  ? enalapril (VASOTEC) 10 MG tablet Take 1 tablet (10 mg total) by mouth daily. Please schedule an office visit before anymore refills.  ? metoprolol tartrate (LOPRESSOR) 25 MG tablet Take 0.5 tablets (12.5 mg total) by mouth 2 (two) times daily.  ? phenylephrine-shark liver oil-mineral oil-petrolatum (PREPARATION H) 0.25-14-74.9 % rectal ointment Place 1 application. rectally 2 (two) times daily as needed for hemorrhoids.  ? torsemide (DEMADEX) 20 MG tablet Take 20 mg by mouth every morning.  ? zinc oxide (MEIJER ZINC OXIDE) 20 % ointment Apply 1 application. topically as needed for irritation.  ? docusate sodium (COLACE) 50 MG capsule Take 1 capsule (50 mg total) by mouth 2 (two) times daily. (Patient not taking: Reported on 08/22/2021)  ? ?No facility-administered medications prior to visit.  ? ? ?Review of Systems  ?Cardiovascular:  Negative for chest pain, palpitations and leg swelling.  ?Gastrointestinal:  Positive for blood in stool (tinge of blood on toilet tissue) and constipation. Negative for abdominal pain and rectal pain.  ?Genitourinary:  Positive for vaginal bleeding. Negative for frequency, hematuria, vaginal discharge and vaginal pain.  ? ? ?  Objective  ?  ?BP (!) 151/67 (BP Location: Right Arm, Patient Position:  Sitting, Cuff Size: Normal)   Pulse (!) 55   Temp 97.6 ?F (36.4 ?C) (Oral)   Resp 16   Wt 131 lb (59.4 kg)   SpO2 100%   BMI 24.75 kg/m?  ? ? ?Physical Exam ?Constitutional:   ?   General: She is in acute distress.  ?   Appearance: Normal appearance. She is normal  weight.  ?HENT:  ?   Head: Normocephalic and atraumatic.  ?Cardiovascular:  ?   Pulses: Normal pulses.  ?Genitourinary: ?   Comments: Mild area of irritation restricted to vulvar area, no discharge noted. ?Patient has difficulties with proper toileting, area around anus, perineum smeared with feces.  ?Neurological:  ?   General: No focal deficit present.  ?   Mental Status: She is alert and oriented to person, place, and time.  ?Psychiatric:  ?   Comments: Patient is irritated.  ?  ? ? ?No results found for any visits on 08/22/21. ? Assessment & Plan  ?  ? ?1. Urinary tract infection with hematuria, site unspecified ?Finished a course of Cipro ?Considering difficulties with anal hygiene, discussed with patient and her granddaughter about high risk for recurrent UTI ?Recommended to add monistat to prior topical treatment ?Recommended to check /consider [facility] services or other living arrangement for grandmother. ? ?2. External hemorrhoids ?Hx of internal hemorrhoids ?Improved based on PE ?Discussed about importance of anal hygiene ?Strongly recommended to check /consider [facility] services or other living arrangement for grandmother. ? ?Continue: ?- dibucaine (NUPERCAINAL) 1 % OINT; Place 1 application. rectally as needed for hemorrhoids.  Dispense: 28 g; Refill: 0 ?Local anesthetics for relief of pain and itch; applied to perianal area (not inserted into rectum) ?- docusate sodium (COLACE) 50 MG capsule; Take 1 capsule (50 mg total) by mouth 2 (two) times daily.  Dispense: 10 capsule; Refill: 0 ?To soften stool ?- zinc oxide (MEIJER ZINC OXIDE) 20 % ointment; Apply 1 application. topically as needed for irritation.  Dispense: 56.7 g; Refill: 0 ?- phenylephrine-shark liver oil-mineral oil-petrolatum (PREPARATION H) 0.25-14-74.9 % rectal ointment; Place 1 application. rectally 2 (two) times daily as needed for hemorrhoids.  Dispense: 28 g; Refill: 0 ?to shrink hemorrhoids and ameliorate bleeding, pain, itch ?  ?Dietary  modification with adequate fluid (generally ?2 L water per day) and high fiber (25 to 35 g/day). Fiber supplementation (psillium husk) to complement diet ?Fiber supplementation helps relieve overall symptoms and bleeding ?Sitz bath with warm water for pain relief ?Witch hazel solution, wipes, pads (Preparation H, Tucks) after stooling! ? ?3. Rectal bleeding ?Could be due to internal hemorrhoids ?FU for chronic conditions was scheduled with Mikey Kirschner. ? ?The patient was advised to call back or seek an in-person evaluation if the symptoms worsen or if the condition fails to improve as anticipated. ? ?I discussed the assessment and treatment plan with the patient. The patient was provided an opportunity to ask questions and all were answered. The patient agreed with the plan and demonstrated an understanding of the instructions. ? ?The entirety of the information documented in the History of Present Illness, Review of Systems and Physical Exam were personally obtained by me. Portions of this information were initially documented by the CMA and reviewed by me for thoroughness and accuracy.  ?Portions of this note were created using dictation software and may contain typographical errors.  ?  ?  ? Total encounter time more than 30 minutes ? Greater than 50% was spent in counseling and coordination of care with the  patient ? ? ?Mardene Speak, PA-C  ?[place] ?[phone] (phone) ?[phone] (fax) ? ?[facility] ?

## 2021-08-22 ENCOUNTER — Encounter: Payer: Self-pay | Admitting: Physician Assistant

## 2021-08-22 ENCOUNTER — Ambulatory Visit (INDEPENDENT_AMBULATORY_CARE_PROVIDER_SITE_OTHER): Payer: PPO | Admitting: Physician Assistant

## 2021-08-22 VITALS — BP 151/67 | HR 55 | Temp 97.6°F | Resp 16 | Wt 131.0 lb

## 2021-08-22 DIAGNOSIS — K644 Residual hemorrhoidal skin tags: Secondary | ICD-10-CM | POA: Diagnosis not present

## 2021-08-22 DIAGNOSIS — N390 Urinary tract infection, site not specified: Secondary | ICD-10-CM | POA: Diagnosis not present

## 2021-08-22 DIAGNOSIS — R319 Hematuria, unspecified: Secondary | ICD-10-CM | POA: Diagnosis not present

## 2021-08-22 DIAGNOSIS — K625 Hemorrhage of anus and rectum: Secondary | ICD-10-CM

## 2021-09-09 ENCOUNTER — Ambulatory Visit: Payer: PPO | Admitting: Psychiatry

## 2021-09-13 ENCOUNTER — Other Ambulatory Visit: Payer: Self-pay | Admitting: Physician Assistant

## 2021-09-13 DIAGNOSIS — I10 Essential (primary) hypertension: Secondary | ICD-10-CM

## 2021-09-18 ENCOUNTER — Ambulatory Visit (INDEPENDENT_AMBULATORY_CARE_PROVIDER_SITE_OTHER): Payer: PPO | Admitting: Physician Assistant

## 2021-09-18 ENCOUNTER — Encounter: Payer: Self-pay | Admitting: Physician Assistant

## 2021-09-18 VITALS — BP 155/63 | HR 59 | Temp 97.9°F | Wt 127.0 lb

## 2021-09-18 DIAGNOSIS — D649 Anemia, unspecified: Secondary | ICD-10-CM | POA: Diagnosis not present

## 2021-09-18 DIAGNOSIS — N184 Chronic kidney disease, stage 4 (severe): Secondary | ICD-10-CM | POA: Diagnosis not present

## 2021-09-18 DIAGNOSIS — I5032 Chronic diastolic (congestive) heart failure: Secondary | ICD-10-CM | POA: Diagnosis not present

## 2021-09-18 DIAGNOSIS — I130 Hypertensive heart and chronic kidney disease with heart failure and stage 1 through stage 4 chronic kidney disease, or unspecified chronic kidney disease: Secondary | ICD-10-CM | POA: Insufficient documentation

## 2021-09-18 DIAGNOSIS — I10 Essential (primary) hypertension: Secondary | ICD-10-CM | POA: Diagnosis not present

## 2021-09-18 DIAGNOSIS — R159 Full incontinence of feces: Secondary | ICD-10-CM | POA: Insufficient documentation

## 2021-09-18 NOTE — Assessment & Plan Note (Signed)
Chronic, stable with medications. Continue HTN meds Will check CMP

## 2021-09-18 NOTE — Assessment & Plan Note (Addendum)
Takes torsemide and metoprolol. Torsemide is daily unless she will be out, and then skips d/t increased urination. Reports she would have to skip 2-3+ days before peripheral edema occurred.  Euvolemic today Will check cmp. Discussed referral to cardiology, in our joint opinion, due to age we will minimize referrals. At this time continue medications, see CKD note.

## 2021-09-18 NOTE — Assessment & Plan Note (Signed)
Advised increasing fiber in diet--metamucil. First GI appt 10/23, we can try a different office for an earlier appt, per family

## 2021-09-18 NOTE — Progress Notes (Signed)
Established patient visit   Patient: [name]   DOB: 05-11-1924   88 y.o. Female  MRN: [id number] Visit Date: 09/18/2021  Today's healthcare provider: Mikey Kirschner, PA-C   Cc. HTN f/u, chronic condition f/u  Subjective      HPI  [name] presents today with her granddaughter [name].  Hypertension, follow-up  BP Readings from Last 3 Encounters:  09/18/21 (!) 155/63  08/22/21 (!) 151/67  08/18/21 (!) 142/72   Wt Readings from Last 3 Encounters:  09/18/21 127 lb (57.6 kg)  08/22/21 131 lb (59.4 kg)  08/18/21 129 lb 4.8 oz (58.7 kg)     She was last seen for hypertension more than 1 year ago. She has only had acute visits in the past year. BP at that visit was as above. Management since that visit includes none.  Pertinent labs Lab Results  Component Value Date   CHOL 189 03/10/2021   HDL 41 03/10/2021   LDLCALC 123 (H) 03/10/2021   TRIG 138 03/10/2021   CHOLHDL 4.6 (H) 03/10/2021   Lab Results  Component Value Date   NA 134 (L) 11/25/2020   K 4.6 11/25/2020   CREATININE 1.69 (H) 11/25/2020   GFRNONAA 28 (L) 11/25/2020   GLUCOSE 90 11/25/2020   TSH 2.780 03/10/2021     The ASCVD Risk score (Arnett DK, et al., 2019) failed to calculate for the following reasons:   The 2019 ASCVD risk score is only valid for ages 62 to 9   The patient has a prior MI or stroke diagnosis  --------------------------------------------------------------------------------------------------- [name] reports one episode yesterday of stool incontinence when she used the restroom to urinate. This concerned her. She still also occasionally feels a 'marble-like' area on her anus. Denies pain, constipation. Reports rarely will see some blood on the toilet tissue.   Medications: Outpatient Medications Prior to Visit  Medication Sig   acetaminophen (TYLENOL) 325 MG tablet Take 2 tablets (650 mg total) by mouth every 6 (six) hours as needed for mild pain (or Fever >/= 101).   dibucaine  (NUPERCAINAL) 1 % OINT Place 1 application. rectally as needed for hemorrhoids.   docusate sodium (COLACE) 50 MG capsule Take 1 capsule (50 mg total) by mouth 2 (two) times daily.   enalapril (VASOTEC) 10 MG tablet Take 1 tablet (10 mg total) by mouth daily. Please schedule an office visit before anymore refills.   metoprolol tartrate (LOPRESSOR) 25 MG tablet TAKE 0.5 TABLETS BY MOUTH 2 TIMES DAILY.   phenylephrine-shark liver oil-mineral oil-petrolatum (PREPARATION H) 0.25-14-74.9 % rectal ointment Place 1 application. rectally 2 (two) times daily as needed for hemorrhoids.   torsemide (DEMADEX) 20 MG tablet Take 20 mg by mouth every morning.   zinc oxide (MEIJER ZINC OXIDE) 20 % ointment Apply 1 application. topically as needed for irritation.   No facility-administered medications prior to visit.    Review of Systems  Constitutional:  Negative for fatigue and fever.  Respiratory:  Negative for cough and shortness of breath.   Cardiovascular:  Negative for chest pain and leg swelling.  Gastrointestinal:  Positive for blood in stool. Negative for abdominal pain.  Neurological:  Negative for dizziness and headaches.      Objective    BP (!) 155/63 (BP Location: Right Arm, Patient Position: Sitting, Cuff Size: Normal)   Pulse (!) 59   Temp 97.9 F (36.6 C) (Oral)   Wt 127 lb (57.6 kg)   SpO2 100%   BMI 24.00 kg/m  Physical Exam Constitutional:      General: She is awake.     Appearance: She is well-developed.  HENT:     Head: Normocephalic.  Eyes:     Conjunctiva/sclera: Conjunctivae normal.  Cardiovascular:     Rate and Rhythm: Normal rate and regular rhythm.     Heart sounds: Normal heart sounds.  Pulmonary:     Effort: Pulmonary effort is normal.     Breath sounds: Normal breath sounds.  Musculoskeletal:     Right lower leg: No edema.     Left lower leg: No edema.  Skin:    General: Skin is warm.  Neurological:     Mental Status: She is alert and oriented to  person, place, and time.  Psychiatric:        Attention and Perception: Attention normal.        Mood and Affect: Mood normal.        Speech: Speech normal.        Behavior: Behavior is cooperative.      No results found for any visits on 09/18/21.  Assessment & Plan     Problem List Items Addressed This Visit       Cardiovascular and Mediastinum   Essential hypertension - Primary    Chronic, stable with medications. Continue HTN meds Will check CMP      Relevant Orders   Comprehensive Metabolic Panel (CMET)   Chronic diastolic CHF (congestive heart failure) (HCC)    Takes torsemide and metoprolol. Torsemide is daily unless she will be out, and then skips d/t increased urination. Reports she would have to skip 2-3+ days before peripheral edema occurred.  Euvolemic today Will check cmp. Discussed referral to cardiology, in our joint opinion, due to age we will minimize referrals. At this time continue medications, see CKD note.        Genitourinary   CKD (chronic kidney disease) stage 4, GFR 15-29 ml/min (HCC)    Discussed extent of kidney function w/ family Adequate fluid intake, avoid nsaids, ibuprofen, motrin, alleve D/t cocurrent anemia and toresmide use, will refer to nephrology for advice. Will check cmp for stability      Relevant Orders   Comprehensive Metabolic Panel (CMET)   Ambulatory referral to Nephrology     Other   Anemia    More than likely from CKD but diet is poor will check for vit deficiencies.  Will also check cbc for stability      Relevant Orders   CBC w/Diff/Platelet   Ambulatory referral to Nephrology   Iron, TIBC and Ferritin Panel   Vitamin B12   Folate   Incontinence of feces    Advised increasing fiber in diet--metamucil. First GI appt 10/23, we can try a different office for an earlier appt, per family      Relevant Orders   Ambulatory referral to Gastroenterology     Return in about 4 months (around 01/18/2022) for chronic  conditions.      I, Mikey Kirschner, PA-C have reviewed all documentation for this visit. The documentation on  09/18/2021 for the exam, diagnosis, procedures, and orders are all accurate and complete.  Mikey Kirschner, PA-C [facility] [address] [place], [place], [address] Office: [phone] Fax: [place]

## 2021-09-18 NOTE — Assessment & Plan Note (Signed)
More than likely from CKD but diet is poor will check for vit deficiencies.  Will also check cbc for stability

## 2021-09-18 NOTE — Patient Instructions (Addendum)
Metamucil supplement

## 2021-09-18 NOTE — Assessment & Plan Note (Signed)
Discussed extent of kidney function w/ family Adequate fluid intake, avoid nsaids, ibuprofen, motrin, alleve D/t cocurrent anemia and toresmide use, will refer to nephrology for advice. Will check cmp for stability

## 2021-09-25 DIAGNOSIS — R195 Other fecal abnormalities: Secondary | ICD-10-CM | POA: Insufficient documentation

## 2021-10-12 ENCOUNTER — Other Ambulatory Visit: Payer: Self-pay | Admitting: Physician Assistant

## 2021-10-12 DIAGNOSIS — I10 Essential (primary) hypertension: Secondary | ICD-10-CM

## 2021-11-06 ENCOUNTER — Other Ambulatory Visit: Payer: Self-pay | Admitting: Family Medicine

## 2021-11-06 DIAGNOSIS — I10 Essential (primary) hypertension: Secondary | ICD-10-CM

## 2021-11-06 NOTE — Telephone Encounter (Signed)
Requested Prescriptions  Pending Prescriptions Disp Refills  . enalapril (VASOTEC) 10 MG tablet [Pharmacy Med Name: ENALAPRIL MALEATE 10 MG TAB] 90 tablet 1    Sig: TAKE 1 TABLET (10 MG TOTAL) BY MOUTH DAILY. PLEASE SCHEDULE AN OFFICE VISIT BEFORE ANYMORE REFILLS.     Cardiovascular:  ACE Inhibitors Failed - 11/06/2021  2:19 AM      Failed - Cr in normal range and within 180 days    Creat  Date Value Ref Range Status  02/05/2017 0.84 0.60 - 0.88 mg/dL Final    Comment:    For patients >33 years of age, the reference limit for Creatinine is approximately 13% higher for people identified as African-American. .    Creatinine, Ser  Date Value Ref Range Status  11/25/2020 1.69 (H) 0.44 - 1.00 mg/dL Final   Creatinine, Urine  Date Value Ref Range Status  01/14/2017 48 mg/dL Final  01/14/2017 47 mg/dL Final         Failed - K in normal range and within 180 days    Potassium  Date Value Ref Range Status  11/25/2020 4.6 3.5 - 5.1 mmol/L Final  09/13/2013 3.7 3.5 - 5.1 mmol/L Final         Failed - Last BP in normal range    BP Readings from Last 1 Encounters:  09/18/21 (!) 155/63         Passed - Patient is not pregnant      Passed - Valid encounter within last 6 months    Recent Outpatient Visits          1 month ago Essential hypertension   [facility], [place], PA-C   2 months ago Urinary tract infection with hematuria, site unspecified   [employer], [place], PA-C   2 months ago Acute renal failure superimposed on stage 4 chronic kidney disease, unspecified acute renal failure type ([place])   [employer], [place], PA-C   5 months ago Loss of memory   [employer], Dani Gobble, PA-C   5 months ago    [place], PA-C      Future Appointments            In 2 months Drubel, Ria Comment, PA-C [employer], [place]

## 2022-01-12 ENCOUNTER — Other Ambulatory Visit: Payer: Self-pay

## 2022-01-13 ENCOUNTER — Encounter: Payer: Self-pay | Admitting: Gastroenterology

## 2022-01-13 ENCOUNTER — Ambulatory Visit (INDEPENDENT_AMBULATORY_CARE_PROVIDER_SITE_OTHER): Payer: PPO | Admitting: Gastroenterology

## 2022-01-13 VITALS — BP 179/80 | HR 76 | Temp 97.9°F | Ht 61.0 in | Wt 128.1 lb

## 2022-01-13 DIAGNOSIS — N823 Fistula of vagina to large intestine: Secondary | ICD-10-CM | POA: Diagnosis not present

## 2022-01-13 MED ORDER — GOLYTELY 236 G PO SOLR: 4000.0000 mL | Freq: Once | ORAL | 0 refills | Status: AC

## 2022-01-13 NOTE — Progress Notes (Unsigned)
[name] Darby, MD [address]  [place]  [place], [place] [address]  Main: [phone]  Fax: [phone]    Gastroenterology Consultation  Referring Provider:     Mikey Kirschner, PA-C Primary Care Physician:  Mikey Kirschner, PA-C Primary Gastroenterologist:  Dr. Cephas [name] Reason for Consultation: Leakage of stool per vagina        HPI:   [name] is a 88 y.o. female referred by Mikey Kirschner, PA-C  for consultation & management of 3 months history of leakage of stool per vagina.  Patient noticed that for the last 3 months she has been noticing stool coming out of vaginal especially in the mornings when she wipes down.  Patient has a caregiver who accompanied her today and that she noticed the stool coming from the front when she wipes.  Patient denies any burning urination, lower abdominal discomfort, rectal pain or discomfort.  Patient does report scant amount of rectal bleeding, particularly on wiping.  Patient uses walker for support.  Patient had history of diverticulitis in the past.  NSAIDs: None  Antiplts/Anticoagulants/Anti thrombotics: None  GI Procedures: ERCP in 2015 for choledocholithiasis s/p biliary sphincterotomy and stone extraction  Past Medical History:  Diagnosis Date   Acute UTI 08/21/2015   Bad memory 10/16/2014   Below normal amount of sodium in the blood 10/16/2014   Breast cyst    removed   Diverticulitis    Fall 07/29/2015   Foot pain 12/31/2014   Gallstones    Hypertension    Hyponatremia 01/11/2017   Low sodium levels    Near syncope 07/21/2015   Scalp laceration 07/29/2015    Past Surgical History:  Procedure Laterality Date   ABDOMINAL HYSTERECTOMY  2000   APPENDECTOMY  1940's   bladder tack  1990's   BREAST CYST INCISION AND DRAINAGE Left 1990's   CHOLECYSTECTOMY  09/11/13   Cholangiogram suggested a retained stone ductal dilatation.   ERCP W/ SPHICTEROTOMY  09/12/2013   Recurrent stone identified post cholecystectomy.    NASAL SINUS SURGERY       Current Outpatient Medications:    acetaminophen (TYLENOL) 325 MG tablet, Take 2 tablets (650 mg total) by mouth every 6 (six) hours as needed for mild pain (or Fever >/= 101)., Disp: , Rfl:    dibucaine (NUPERCAINAL) 1 % OINT, Place 1 application. rectally as needed for hemorrhoids., Disp: 28 g, Rfl: 0   docusate sodium (COLACE) 50 MG capsule, Take 1 capsule (50 mg total) by mouth 2 (two) times daily., Disp: 10 capsule, Rfl: 0   enalapril (VASOTEC) 10 MG tablet, TAKE 1 TABLET (10 MG TOTAL) BY MOUTH DAILY. PLEASE SCHEDULE AN OFFICE VISIT BEFORE ANYMORE REFILLS., Disp: 90 tablet, Rfl: 1   metoprolol tartrate (LOPRESSOR) 25 MG tablet, TAKE 1/2 TABLET TWICE A DAY BY MOUTH, Disp: 90 tablet, Rfl: 0   phenylephrine-shark liver oil-mineral oil-petrolatum (PREPARATION H) 0.25-14-74.9 % rectal ointment, Place 1 application. rectally 2 (two) times daily as needed for hemorrhoids., Disp: 28 g, Rfl: 0   torsemide (DEMADEX) 20 MG tablet, Take 20 mg by mouth every morning., Disp: , Rfl: 11   traZODone (DESYREL) 50 MG tablet, Take by mouth., Disp: , Rfl:    zinc oxide (MEIJER ZINC OXIDE) 20 % ointment, Apply 1 application. topically as needed for irritation., Disp: 56.7 g, Rfl: 0   Family History  Problem Relation Age of Onset   Hypertension Father    CAD Father    Cancer Brother  unknown type     Social History   Tobacco Use   Smoking status: Never   Smokeless tobacco: Never  Vaping Use   Vaping Use: Never used  Substance Use Topics   Alcohol use: No   Drug use: No    Allergies as of 01/13/2022 - Review Complete 01/13/2022  Allergen Reaction Noted   Bactrim [sulfamethoxazole-trimethoprim] Nausea And Vomiting 01/12/2017   Metronidazole Other (See Comments) and Hives 10/16/2014   Nitrofurantoin Hives 10/16/2014   Nitrofurantoin monohyd macro Other (See Comments) 10/16/2014   Penicillins Diarrhea, Other (See Comments), and Hives 10/16/2014   Zafirlukast  Other (See Comments) and Hives 10/16/2014   Keflex [cephalexin] Rash 08/29/2013    Review of Systems:    All systems reviewed and negative except where noted in HPI.   Physical Exam:  BP (!) 179/80 (BP Location: Left Arm, Patient Position: Sitting, Cuff Size: Normal)   Pulse 76   Temp 97.9 F (36.6 C) (Oral)   Ht 5\' 1"  (1.549 m)   Wt 128 lb 2 oz (58.1 kg)   BMI 24.21 kg/m  No LMP recorded. Patient has had a hysterectomy.  General:   Alert,  Well-developed, well-nourished, pleasant and cooperative in NAD Head:  Normocephalic and atraumatic. Eyes:  Sclera clear, no icterus.   Conjunctiva pink. Ears:  Normal auditory acuity. Nose:  No deformity, discharge, or lesions. Mouth:  No deformity or lesions,oropharynx pink & moist. Neck:  Supple; no masses or thyromegaly. Lungs:  Respirations even and unlabored.  Clear throughout to auscultation.   No wheezes, crackles, or rhonchi. No acute distress. Heart:  Regular rate and rhythm; no murmurs, clicks, rubs, or gallops. Abdomen:  Normal bowel sounds. Soft, non-tender and non-distended without masses, hepatosplenomegaly or hernias noted.  No guarding or rebound tenderness.   Rectal: Normal perianal skin, nontender digital rectal exam Msk:  Symmetrical without gross deformities. Good, equal movement & strength bilaterally. Pulses:  Normal pulses noted. Extremities:  No clubbing or edema.  No cyanosis. Neurologic:  Alert and oriented x3;  grossly normal neurologically. Skin:  Intact without significant lesions or rashes. No jaundice. Psych:  Alert and cooperative. Normal mood and affect.  Imaging Studies: No abdominal imaging  Assessment and Plan:   [name] is a 88 y.o. female with a of hypertension, s/p cholecystectomy, ERCP in 2015 secondary to choledocholithiasis s/p biliary sphincterotomy and stone extraction CKD is seen in consultation due to concern for leakage of stool per vagina. Patient had history of diverticulitis in the  past.  Unclear when her last colonoscopy was, probably more than 10 years ago.  I am not fully convinced the patient has rectovaginal fistula  Recommend referral to OB/GYN for vaginal exam Recommend barium enema Patient might need sigmoidoscopy or colonoscopy based on the above work-up   Follow up after the above work-up   [name] Darby, MD

## 2022-01-13 NOTE — Patient Instructions (Addendum)
Your Barium enema is schedule for 01/19/2022 [facility] at 10:30am.  You will need to start clear liquids the day before procedure.  5pm:  Fill to the fill line with water or Gatorade and mix together. Drink 8oz every 20 to 30 minutes till gone.  Nothing to drink after midnight.

## 2022-01-14 ENCOUNTER — Encounter: Payer: Self-pay | Admitting: Gastroenterology

## 2022-01-15 NOTE — Progress Notes (Signed)
I,Sha'taria Tyson,acting as a Education administrator for [employer], PA-C.,have documented all relevant documentation on the behalf of Mikey Kirschner, PA-C,as directed by  Mikey Kirschner, PA-C while in the presence of Mikey Kirschner, PA-C.   Established patient visit   Patient: [name]   DOB: [birth year]   88 y.o. Female  MRN: [id number] Visit Date: 01/16/2022  Today's healthcare provider: Mikey Kirschner, PA-C   Cc. Chronic care f/u  Subjective    HPI  Hypertension, follow-up  BP Readings from Last 3 Encounters:  01/16/22 (!) 157/67  01/13/22 (!) 179/80  09/18/21 (!) 155/63   Wt Readings from Last 3 Encounters:  01/16/22 127 lb 4.8 oz (57.7 kg)  01/13/22 128 lb 2 oz (58.1 kg)  09/18/21 127 lb (57.6 kg)     She was last seen for hypertension 4 months ago.  BP at that visit was 155/63. Management since that visit includes continue current treatment.  She reports excellent compliance with treatment. She is not having side effects. She is following a Regular diet. She is not exercising. She does not smoke.  Use of agents associated with hypertension: none.   Outside blood pressures are not being checked Symptoms: No chest pain No chest pressure  No palpitations No syncope  No dyspnea Yes orthopnea  No paroxysmal nocturnal dyspnea Yes lower extremity edema   Pertinent labs Lab Results  Component Value Date   CHOL 189 03/10/2021   HDL 41 03/10/2021   LDLCALC 123 (H) 03/10/2021   TRIG 138 03/10/2021   CHOLHDL 4.6 (H) 03/10/2021   Lab Results  Component Value Date   NA 134 (L) 11/25/2020   K 4.6 11/25/2020   CREATININE 1.69 (H) 11/25/2020   GFRNONAA 28 (L) 11/25/2020   GLUCOSE 90 11/25/2020   TSH 2.780 03/10/2021     The ASCVD Risk score (Arnett DK, et al., 2019) failed to calculate for the following reasons:   The 2019 ASCVD risk score is only valid for ages 81 to 55   The patient has a prior MI or stroke  diagnosis  ---------------------------------------------------------------------------------------------------   Medications: Outpatient Medications Prior to Visit  Medication Sig   acetaminophen (TYLENOL) 325 MG tablet Take 2 tablets (650 mg total) by mouth every 6 (six) hours as needed for mild pain (or Fever >/= 101).   [DISCONTINUED] enalapril (VASOTEC) 10 MG tablet TAKE 1 TABLET (10 MG TOTAL) BY MOUTH DAILY. PLEASE SCHEDULE AN OFFICE VISIT BEFORE ANYMORE REFILLS.   [DISCONTINUED] metoprolol tartrate (LOPRESSOR) 25 MG tablet TAKE 1/2 TABLET TWICE A DAY BY MOUTH   [DISCONTINUED] torsemide (DEMADEX) 20 MG tablet Take 20 mg by mouth every morning.   [DISCONTINUED] dibucaine (NUPERCAINAL) 1 % OINT Place 1 application. rectally as needed for hemorrhoids. (Patient not taking: Reported on 01/16/2022)   [DISCONTINUED] docusate sodium (COLACE) 50 MG capsule Take 1 capsule (50 mg total) by mouth 2 (two) times daily. (Patient not taking: Reported on 01/16/2022)   [DISCONTINUED] phenylephrine-shark liver oil-mineral oil-petrolatum (PREPARATION H) 0.25-14-74.9 % rectal ointment Place 1 application. rectally 2 (two) times daily as needed for hemorrhoids. (Patient not taking: Reported on 01/16/2022)   [DISCONTINUED] traZODone (DESYREL) 50 MG tablet Take by mouth. (Patient not taking: Reported on 01/16/2022)   [DISCONTINUED] zinc oxide (MEIJER ZINC OXIDE) 20 % ointment Apply 1 application. topically as needed for irritation. (Patient not taking: Reported on 01/16/2022)   No facility-administered medications prior to visit.    Review of Systems  Constitutional:  Negative for fatigue and fever.  Respiratory:  Negative for cough and shortness of breath.   Cardiovascular:  Negative for chest pain and leg swelling.  Gastrointestinal:  Negative for abdominal pain.  Neurological:  Negative for dizziness and headaches.       Objective    Blood pressure (!) 157/67, pulse (!) 53, height 5\' 1"  (1.549 m), weight  127 lb 4.8 oz (57.7 kg), SpO2 99 %.   Physical Exam Constitutional:      General: She is awake.     Appearance: She is well-developed.  HENT:     Head: Normocephalic.  Eyes:     Conjunctiva/sclera: Conjunctivae normal.  Cardiovascular:     Rate and Rhythm: Normal rate and regular rhythm.     Heart sounds: Normal heart sounds.  Pulmonary:     Effort: Pulmonary effort is normal.     Breath sounds: Normal breath sounds.  Musculoskeletal:     Comments: Trace edema above mid-calf socks, but at ankle no edema present.    Skin:    General: Skin is warm.  Neurological:     Mental Status: She is alert and oriented to person, place, and time.  Psychiatric:        Attention and Perception: Attention normal.        Mood and Affect: Mood normal.        Speech: Speech normal.        Behavior: Behavior is cooperative.      No results found for any visits on 01/16/22.  Assessment & Plan     Problem List Items Addressed This Visit       Cardiovascular and Mediastinum   Essential hypertension    Within appropriate range for patient would be concerned over fall risk/orthostatic hypotension if I increased htn meds Pt also follows with nephrology now      Relevant Medications   metoprolol tartrate (LOPRESSOR) 25 MG tablet   torsemide (DEMADEX) 20 MG tablet   enalapril (VASOTEC) 10 MG tablet   Chronic diastolic CHF (congestive heart failure) ([place]) - Primary    Euvolemic today. Takes furosemide every other day Reviewed last cmp -- nephrology monitoring q 4 mo       Relevant Medications   metoprolol tartrate (LOPRESSOR) 25 MG tablet   torsemide (DEMADEX) 20 MG tablet   enalapril (VASOTEC) 10 MG tablet   Hypertensive heart and kidney disease with HF and with CKD stage III ([facility])    Last gfr 7/23 36, Cr 1.37 F/b nephrology      Relevant Medications   metoprolol tartrate (LOPRESSOR) 25 MG tablet   torsemide (DEMADEX) 20 MG tablet   enalapril (VASOTEC) 10 MG tablet     Other    Insomnia    Resolved, pt no longer takes trazodone       Return in about 4 months (around 05/19/2022) for AVW.     I, Mikey Kirschner, PA-C have reviewed all documentation for this visit. The documentation on  01/16/2022 for the exam, diagnosis, procedures, and orders are all accurate and complete.  Mikey Kirschner, PA-C [facility] [address] [place], [place], [address] Office: [phone] Fax: [place]

## 2022-01-16 ENCOUNTER — Ambulatory Visit (INDEPENDENT_AMBULATORY_CARE_PROVIDER_SITE_OTHER): Payer: PPO | Admitting: Physician Assistant

## 2022-01-16 ENCOUNTER — Encounter: Payer: Self-pay | Admitting: Physician Assistant

## 2022-01-16 ENCOUNTER — Telehealth: Payer: Self-pay | Admitting: Physician Assistant

## 2022-01-16 VITALS — BP 157/67 | HR 53 | Ht 61.0 in | Wt 127.3 lb

## 2022-01-16 DIAGNOSIS — I130 Hypertensive heart and chronic kidney disease with heart failure and stage 1 through stage 4 chronic kidney disease, or unspecified chronic kidney disease: Secondary | ICD-10-CM

## 2022-01-16 DIAGNOSIS — I10 Essential (primary) hypertension: Secondary | ICD-10-CM

## 2022-01-16 DIAGNOSIS — I5032 Chronic diastolic (congestive) heart failure: Secondary | ICD-10-CM | POA: Diagnosis not present

## 2022-01-16 DIAGNOSIS — N183 Chronic kidney disease, stage 3 unspecified: Secondary | ICD-10-CM

## 2022-01-16 DIAGNOSIS — F5101 Primary insomnia: Secondary | ICD-10-CM | POA: Diagnosis not present

## 2022-01-16 MED ORDER — METOPROLOL TARTRATE 25 MG PO TABS: 12.5000 mg | ORAL_TABLET | Freq: Two times a day (BID) | ORAL | 1 refills | Status: DC

## 2022-01-16 MED ORDER — TORSEMIDE 20 MG PO TABS: ORAL_TABLET | ORAL | 1 refills | Status: DC

## 2022-01-16 MED ORDER — ENALAPRIL MALEATE 10 MG PO TABS: 10.0000 mg | ORAL_TABLET | Freq: Every day | ORAL | 1 refills | Status: DC

## 2022-01-16 NOTE — Assessment & Plan Note (Signed)
Last gfr 7/23 36, Cr 1.37 F/b nephrology

## 2022-01-16 NOTE — Assessment & Plan Note (Signed)
Within appropriate range for patient would be concerned over fall risk/orthostatic hypotension if I increased htn meds Pt also follows with nephrology now

## 2022-01-16 NOTE — Telephone Encounter (Signed)
Mrs. Juliene Pina advised as below.

## 2022-01-16 NOTE — Assessment & Plan Note (Signed)
Euvolemic today. Takes furosemide every other day Reviewed last cmp -- nephrology monitoring q 4 mo

## 2022-01-16 NOTE — Telephone Encounter (Signed)
Granddaughter Myra reading the warning from the [employer] that pt needs to drink prior to her procedure on Monday. 1.Pt takes fluid pills 2.  High blood pressure and heart issues 3. Stage 4 kidney failure  All these are red flags in the warning label to tell your dr and pt has all.  Another side effect could be loss of electrolytes in your blood, and pt has had this issue several times in the past Myra would appreciate a call back asap .  She just wants to be prepared

## 2022-01-16 NOTE — Assessment & Plan Note (Signed)
Resolved, pt no longer takes trazodone

## 2022-01-19 ENCOUNTER — Ambulatory Visit
Admission: RE | Admit: 2022-01-19 | Discharge: 2022-01-19 | Disposition: A | Discharge status: Home / Self Care | Payer: PPO | Source: Ambulatory Visit | Attending: Gastroenterology | Admitting: Gastroenterology

## 2022-01-19 DIAGNOSIS — N823 Fistula of vagina to large intestine: Secondary | ICD-10-CM | POA: Insufficient documentation

## 2022-01-20 ENCOUNTER — Telehealth: Payer: Self-pay

## 2022-01-20 NOTE — Telephone Encounter (Signed)
Called patient granddaughter and she verbalized understanding of results

## 2022-01-20 NOTE — Telephone Encounter (Signed)
-----   Message from Lin Landsman, MD sent at 01/19/2022  4:40 PM EDT ----- [name]  Please inform patient's power of attorney that she does not have rectovaginal fistula based on barium enema.  If her stools are loose, have her try Imodium as needed  RV

## 2022-01-22 NOTE — Progress Notes (Signed)
Appointment cancelled, patient not seen

## 2022-01-23 ENCOUNTER — Telehealth: Payer: Self-pay | Admitting: Physician Assistant

## 2022-01-23 NOTE — Telephone Encounter (Signed)
Forms faxed and given to medical records to be scanned in to patient chart. Myra advised.

## 2022-01-23 NOTE — Telephone Encounter (Signed)
[name](GRANDDAUGHTER) is calling to check on the status of [employer] form. Form was dropped off 01/16/22. Was advised that the form would be uploaded into Mychart. [name] has not seen the form. Please advise  CB- [phone]

## 2022-01-26 ENCOUNTER — Encounter: Payer: Self-pay | Admitting: Physician Assistant

## 2022-01-30 ENCOUNTER — Encounter: Payer: Self-pay | Admitting: Physician Assistant

## 2022-04-05 ENCOUNTER — Other Ambulatory Visit: Payer: Self-pay | Admitting: Physician Assistant

## 2022-04-05 DIAGNOSIS — I130 Hypertensive heart and chronic kidney disease with heart failure and stage 1 through stage 4 chronic kidney disease, or unspecified chronic kidney disease: Secondary | ICD-10-CM

## 2022-04-05 DIAGNOSIS — I10 Essential (primary) hypertension: Secondary | ICD-10-CM

## 2022-05-08 ENCOUNTER — Observation Stay: Payer: PPO

## 2022-05-08 ENCOUNTER — Inpatient Hospital Stay
Admission: EM | Admit: 2022-05-08 | Discharge: 2022-05-14 | DRG: 065 | Disposition: A | Discharge status: Skilled Nursing Facility | Payer: PPO | Attending: Internal Medicine | Admitting: Internal Medicine

## 2022-05-08 ENCOUNTER — Emergency Department: Payer: PPO

## 2022-05-08 ENCOUNTER — Other Ambulatory Visit: Payer: Self-pay

## 2022-05-08 DIAGNOSIS — R410 Disorientation, unspecified: Principal | ICD-10-CM

## 2022-05-08 DIAGNOSIS — I672 Cerebral atherosclerosis: Secondary | ICD-10-CM | POA: Diagnosis present

## 2022-05-08 DIAGNOSIS — Z8249 Family history of ischemic heart disease and other diseases of the circulatory system: Secondary | ICD-10-CM

## 2022-05-08 DIAGNOSIS — G9341 Metabolic encephalopathy: Secondary | ICD-10-CM

## 2022-05-08 DIAGNOSIS — R54 Age-related physical debility: Secondary | ICD-10-CM | POA: Diagnosis present

## 2022-05-08 DIAGNOSIS — Z87440 Personal history of urinary (tract) infections: Secondary | ICD-10-CM

## 2022-05-08 DIAGNOSIS — I130 Hypertensive heart and chronic kidney disease with heart failure and stage 1 through stage 4 chronic kidney disease, or unspecified chronic kidney disease: Secondary | ICD-10-CM | POA: Diagnosis present

## 2022-05-08 DIAGNOSIS — F03918 Unspecified dementia, unspecified severity, with other behavioral disturbance: Secondary | ICD-10-CM | POA: Diagnosis present

## 2022-05-08 DIAGNOSIS — G9349 Other encephalopathy: Secondary | ICD-10-CM | POA: Diagnosis present

## 2022-05-08 DIAGNOSIS — R4701 Aphasia: Secondary | ICD-10-CM | POA: Diagnosis present

## 2022-05-08 DIAGNOSIS — W06XXXA Fall from bed, initial encounter: Secondary | ICD-10-CM | POA: Diagnosis present

## 2022-05-08 DIAGNOSIS — Z809 Family history of malignant neoplasm, unspecified: Secondary | ICD-10-CM

## 2022-05-08 DIAGNOSIS — Y92009 Unspecified place in unspecified non-institutional (private) residence as the place of occurrence of the external cause: Secondary | ICD-10-CM

## 2022-05-08 DIAGNOSIS — F0390 Unspecified dementia without behavioral disturbance: Secondary | ICD-10-CM | POA: Diagnosis not present

## 2022-05-08 DIAGNOSIS — M1990 Unspecified osteoarthritis, unspecified site: Secondary | ICD-10-CM | POA: Diagnosis present

## 2022-05-08 DIAGNOSIS — Z79899 Other long term (current) drug therapy: Secondary | ICD-10-CM

## 2022-05-08 DIAGNOSIS — I6389 Other cerebral infarction: Principal | ICD-10-CM | POA: Diagnosis present

## 2022-05-08 DIAGNOSIS — I10 Essential (primary) hypertension: Secondary | ICD-10-CM | POA: Diagnosis present

## 2022-05-08 DIAGNOSIS — N390 Urinary tract infection, site not specified: Secondary | ICD-10-CM | POA: Diagnosis not present

## 2022-05-08 DIAGNOSIS — Z66 Do not resuscitate: Secondary | ICD-10-CM | POA: Diagnosis present

## 2022-05-08 DIAGNOSIS — Z881 Allergy status to other antibiotic agents status: Secondary | ICD-10-CM

## 2022-05-08 DIAGNOSIS — Z9049 Acquired absence of other specified parts of digestive tract: Secondary | ICD-10-CM

## 2022-05-08 DIAGNOSIS — B961 Klebsiella pneumoniae [K. pneumoniae] as the cause of diseases classified elsewhere: Secondary | ICD-10-CM | POA: Diagnosis present

## 2022-05-08 DIAGNOSIS — N3000 Acute cystitis without hematuria: Secondary | ICD-10-CM | POA: Diagnosis present

## 2022-05-08 DIAGNOSIS — Z23 Encounter for immunization: Secondary | ICD-10-CM

## 2022-05-08 DIAGNOSIS — N184 Chronic kidney disease, stage 4 (severe): Secondary | ICD-10-CM | POA: Diagnosis present

## 2022-05-08 DIAGNOSIS — Z515 Encounter for palliative care: Secondary | ICD-10-CM

## 2022-05-08 DIAGNOSIS — I509 Heart failure, unspecified: Secondary | ICD-10-CM | POA: Diagnosis present

## 2022-05-08 DIAGNOSIS — R1030 Lower abdominal pain, unspecified: Secondary | ICD-10-CM | POA: Diagnosis not present

## 2022-05-08 DIAGNOSIS — R4182 Altered mental status, unspecified: Secondary | ICD-10-CM

## 2022-05-08 DIAGNOSIS — Z880 Allergy status to penicillin: Secondary | ICD-10-CM

## 2022-05-08 DIAGNOSIS — Z90710 Acquired absence of both cervix and uterus: Secondary | ICD-10-CM

## 2022-05-08 DIAGNOSIS — I639 Cerebral infarction, unspecified: Secondary | ICD-10-CM

## 2022-05-08 DIAGNOSIS — Z888 Allergy status to other drugs, medicaments and biological substances status: Secondary | ICD-10-CM

## 2022-05-08 LAB — CBC WITH DIFFERENTIAL/PLATELET
Abs Immature Granulocytes: 0.02 10*3/uL (ref 0.00–0.07)
Basophils Absolute: 0 10*3/uL (ref 0.0–0.1)
Basophils Relative: 0 %
Eosinophils Absolute: 0.1 10*3/uL (ref 0.0–0.5)
Eosinophils Relative: 1 %
HCT: 33.7 % — ABNORMAL LOW (ref 36.0–46.0)
Hemoglobin: 11.2 g/dL — ABNORMAL LOW (ref 12.0–15.0)
Immature Granulocytes: 0 %
Lymphocytes Relative: 22 %
Lymphs Abs: 1.5 10*3/uL (ref 0.7–4.0)
MCH: 31.9 pg (ref 26.0–34.0)
MCHC: 33.2 g/dL (ref 30.0–36.0)
MCV: 96 fL (ref 80.0–100.0)
Monocytes Absolute: 0.6 10*3/uL (ref 0.1–1.0)
Monocytes Relative: 8 %
Neutro Abs: 4.6 10*3/uL (ref 1.7–7.7)
Neutrophils Relative %: 69 %
Platelets: 163 10*3/uL (ref 150–400)
RBC: 3.51 MIL/uL — ABNORMAL LOW (ref 3.87–5.11)
RDW: 12.7 % (ref 11.5–15.5)
WBC: 6.7 10*3/uL (ref 4.0–10.5)
nRBC: 0 % (ref 0.0–0.2)

## 2022-05-08 LAB — URINALYSIS, ROUTINE W REFLEX MICROSCOPIC
Bilirubin Urine: NEGATIVE
Glucose, UA: NEGATIVE mg/dL
Ketones, ur: NEGATIVE mg/dL
Nitrite: POSITIVE — AB
Protein, ur: 30 mg/dL — AB
Specific Gravity, Urine: 1.01 (ref 1.005–1.030)
Squamous Epithelial / HPF: NONE SEEN /HPF (ref 0–5)
WBC, UA: >50 WBC/hpf (ref 0–5)
pH: 6 (ref 5.0–8.0)

## 2022-05-08 LAB — COMPREHENSIVE METABOLIC PANEL
ALT: 9 U/L (ref 0–44)
AST: 22 U/L (ref 15–41)
Albumin: 3.8 g/dL (ref 3.5–5.0)
Alkaline Phosphatase: 82 U/L (ref 38–126)
Anion gap: 8 (ref 5–15)
BUN: 21 mg/dL (ref 8–23)
CO2: 23 mmol/L (ref 22–32)
Calcium: 9.4 mg/dL (ref 8.9–10.3)
Chloride: 108 mmol/L (ref 98–111)
Creatinine, Ser: 1.25 mg/dL — ABNORMAL HIGH (ref 0.44–1.00)
GFR, Estimated: 40 mL/min — ABNORMAL LOW (ref >60)
Glucose, Bld: 96 mg/dL (ref 70–99)
Potassium: 3.6 mmol/L (ref 3.5–5.1)
Sodium: 139 mmol/L (ref 135–145)
Total Bilirubin: 0.7 mg/dL (ref 0.3–1.2)
Total Protein: 6.6 g/dL (ref 6.5–8.1)

## 2022-05-08 LAB — PROTIME-INR
INR: 1 (ref 0.8–1.2)
Prothrombin Time: 13.3 seconds (ref 11.4–15.2)

## 2022-05-08 LAB — CBG MONITORING, ED: Glucose-Capillary: 104 mg/dL — ABNORMAL HIGH (ref 70–99)

## 2022-05-08 LAB — TROPONIN I (HIGH SENSITIVITY)
Troponin I (High Sensitivity): 13 ng/L (ref <18)
Troponin I (High Sensitivity): 15 ng/L (ref <18)

## 2022-05-08 LAB — APTT: aPTT: 38 seconds — ABNORMAL HIGH (ref 24–36)

## 2022-05-08 MED ORDER — TORSEMIDE 20 MG PO TABS
10.0000 mg | ORAL_TABLET | Freq: Every day | ORAL | Status: DC
  Administered 2022-05-09: 10 mg via ORAL
  Filled 2022-05-08: qty 1

## 2022-05-08 MED ORDER — SODIUM CHLORIDE 0.9 % IV SOLN
1.0000 g | Freq: Once | INTRAVENOUS | Status: AC
  Administered 2022-05-08: 1 g via INTRAVENOUS
  Filled 2022-05-08: qty 5

## 2022-05-08 MED ORDER — SODIUM CHLORIDE 0.9 % IV SOLN
1.0000 g | Freq: Three times a day (TID) | INTRAVENOUS | Status: DC
  Administered 2022-05-09: 1 g via INTRAVENOUS
  Filled 2022-05-08 (×2): qty 5

## 2022-05-08 MED ORDER — INFLUENZA VAC A&B SA ADJ QUAD 0.5 ML IM PRSY
0.5000 mL | PREFILLED_SYRINGE | INTRAMUSCULAR | Status: AC
  Administered 2022-05-12: 0.5 mL via INTRAMUSCULAR
  Filled 2022-05-08: qty 0.5

## 2022-05-08 MED ORDER — KCL IN DEXTROSE-NACL 10-5-0.45 MEQ/L-%-% IV SOLN
INTRAVENOUS | Status: DC
  Filled 2022-05-08: qty 1000

## 2022-05-08 MED ORDER — METOPROLOL TARTRATE 25 MG PO TABS
12.5000 mg | ORAL_TABLET | Freq: Two times a day (BID) | ORAL | Status: DC
  Administered 2022-05-08 – 2022-05-11 (×6): 12.5 mg via ORAL
  Filled 2022-05-08 (×7): qty 1

## 2022-05-08 MED ORDER — ENALAPRIL MALEATE 10 MG PO TABS
5.0000 mg | ORAL_TABLET | Freq: Every day | ORAL | Status: DC
  Administered 2022-05-09 – 2022-05-10 (×2): 5 mg via ORAL
  Filled 2022-05-08 (×4): qty 0.5

## 2022-05-08 MED ORDER — ENOXAPARIN SODIUM 30 MG/0.3ML IJ SOSY
30.0000 mg | PREFILLED_SYRINGE | INTRAMUSCULAR | Status: DC
  Administered 2022-05-08 – 2022-05-13 (×6): 30 mg via SUBCUTANEOUS
  Filled 2022-05-08 (×6): qty 0.3

## 2022-05-08 MED ORDER — QUETIAPINE FUMARATE 25 MG PO TABS
25.0000 mg | ORAL_TABLET | Freq: Every evening | ORAL | Status: DC | PRN
  Administered 2022-05-08: 25 mg via ORAL
  Filled 2022-05-08: qty 1

## 2022-05-08 MED ORDER — ACETAMINOPHEN 325 MG PO TABS
650.0000 mg | ORAL_TABLET | Freq: Four times a day (QID) | ORAL | Status: DC | PRN
  Administered 2022-05-12: 650 mg via ORAL
  Filled 2022-05-08: qty 2

## 2022-05-08 MED ORDER — SODIUM CHLORIDE 0.9% FLUSH
3.0000 mL | Freq: Two times a day (BID) | INTRAVENOUS | Status: DC
  Administered 2022-05-08 – 2022-05-14 (×11): 3 mL via INTRAVENOUS

## 2022-05-08 NOTE — Progress Notes (Signed)
PHARMACIST - PHYSICIAN COMMUNICATION  CONCERNING:  Enoxaparin (Lovenox) for DVT Prophylaxis    RECOMMENDATION: Patient was prescribed enoxaprin 40mg  q24 hours for VTE prophylaxis.   Filed Weights   05/08/22 1540  Weight: 54.4 kg (120 lb)    Body mass index is 21.95 kg/m.  Estimated Creatinine Clearance: 21.3 mL/min (A) (by C-G formula based on SCr of 1.25 mg/dL (H)).   Patient is candidate for enoxaparin 30mg  every 24 hours based on CrCl <82ml/min or Weight <45kg  DESCRIPTION: Pharmacy has adjusted enoxaparin dose per [facility] policy.  Patient is now receiving enoxaparin 30 mg every 24 hours    Lorin Picket, PharmD Clinical Pharmacist  05/08/2022 8:28 PM

## 2022-05-08 NOTE — ED Provider Notes (Signed)
[facility] Provider Note    Event Date/Time   First MD Initiated Contact with Patient 05/08/22 1540     (approximate)   History   Altered Mental Status (Pt from ems. Pt lives alone with aid during the day. unwitnessed fall out of bed onto carpet. (Could possibly hit head on nightstand). Pt not on blood thinners. Has not taking her htn meds today. Pt has hx of dementia and does not remember falling. Family at bedside and states caretaker said at around 1pm pt began acting more altered and supposedly was not able to hold onto things. )   HPI  [name] is a 89 y.o. female with history of CHF stage IV renal failure, hypertension, anemia, dementia and as listed in EMR presents to the emergency department for altered mental status.  Per family at bedside, patient was found in the floor this morning by her aide.  Aide was able to get her up and she was acting normal.  She ate lunch and was at her baseline until about 1:00.  She then became altered and confused.  She normally walks without assistance but has not been able to do so this afternoon.  Family states that when the aide found her that in the floor this morning she had urinated on herself and the urine was very foul smelling.      Physical Exam   Triage Vital Signs: ED Triage Vitals  Enc Vitals Group     BP 05/08/22 1527 (!) 151/57     Pulse Rate 05/08/22 1527 60     Resp --      Temp 05/08/22 1527 97.7 F (36.5 C)     Temp Source 05/08/22 1527 Oral     SpO2 05/08/22 1527 99 %     Weight 05/08/22 1540 120 lb (54.4 kg)     Height 05/08/22 1540 5\' 2"  (1.575 m)     Head Circumference --      Peak Flow --      Pain Score 05/08/22 1540 0     Pain Loc --      Pain Edu? --      Excl. in [place]? --     Most recent vital signs: Vitals:   05/08/22 1539 05/08/22 1540  BP:  (!) 152/48  Pulse:  60  Resp:  13  Temp:    SpO2: 98%     General: Awake, no distress. Follows commands. No focal weakness of  extremities. CV:  Good peripheral perfusion.  Resp:  Normal effort.  Abd:  No distention.  Other:     ED Results / Procedures / Treatments   Labs (all labs ordered are listed, but only abnormal results are displayed) Labs Reviewed  COMPREHENSIVE METABOLIC PANEL - Abnormal; Notable for the following components:      Result Value   Creatinine, Ser 1.25 (*)    GFR, Estimated 40 (*)    All other components within normal limits  CBC WITH DIFFERENTIAL/PLATELET - Abnormal; Notable for the following components:   RBC 3.51 (*)    Hemoglobin 11.2 (*)    HCT 33.7 (*)    All other components within normal limits  URINALYSIS, ROUTINE W REFLEX MICROSCOPIC - Abnormal; Notable for the following components:   Color, Urine YELLOW (*)    APPearance HAZY (*)    Hgb urine dipstick SMALL (*)    Protein, ur 30 (*)    Nitrite POSITIVE (*)    Leukocytes,Ua LARGE (*)  Bacteria, UA FEW (*)    All other components within normal limits  CBG MONITORING, ED - Abnormal; Notable for the following components:   Glucose-Capillary 104 (*)    All other components within normal limits  URINE CULTURE  TROPONIN I (HIGH SENSITIVITY)  TROPONIN I (HIGH SENSITIVITY)     EKG  Normal sinus rhythm, rate 74   RADIOLOGY  Image and radiology report reviewed and interpreted by me. Radiology report consistent with the same.  Chest x-ray negative for acute concerns.  CT head and cervical spine negative for acute findings.  PROCEDURES:  Critical Care performed: No  Procedures   MEDICATIONS ORDERED IN ED:  Medications  aztreonam (AZACTAM) 1 g in sodium chloride 0.9 % 100 mL IVPB (has no administration in time range)     IMPRESSION / MDM / ASSESSMENT AND PLAN / ED COURSE   I have reviewed the triage note.  Differential diagnosis includes, but is not limited to, ICH, subdural hematoma, acute cystitis, sepsis, age related decline  Patient's presentation is most consistent with acute presentation  with potential threat to life or bodily function.  89 year old female presenting to the emergency department for treatment and evaluation after being found in the floor this morning when her aide arrived.  See HPI for further details.  On exam, patient is confused.  She has no focal neurodeficits.  Granddaughter at bedside states that she is not at her cognitive baseline.  The change was noted this afternoon about 1:00.  Workup is overall reassuring.  CBC, CMP and troponin are normal.  Urinalysis however does show a nitrate positive specimen with large amount leukocytes, large amount of white blood cells, and bacteria. Confusion is likely related to UTI.   Granddaughter at bedside. Results discussed with her as well as plan for admission for IV antibiotics and observation for altered mental status. She is agreeable to this plan.   Hospitalist service agrees to accept patient for admission.     FINAL CLINICAL IMPRESSION(S) / ED DIAGNOSES   Final diagnoses:  Disorientation  Acute cystitis without hematuria     Rx / DC Orders   ED Discharge Orders     None        Note:  This document was prepared using Dragon voice recognition software and may include unintentional dictation errors.   Victorino Dike, FNP 05/08/22 Melanee Spry, MD 05/08/22 Drema Halon

## 2022-05-08 NOTE — Assessment & Plan Note (Signed)
Check bladder scan, check CT abdomen, contrast cannot be given intravenous given patient's chronic kidney disease

## 2022-05-08 NOTE — ED Notes (Signed)
Contacted pharmacy in regard to sending ordered medication. Patient resting on stretcher with granddaughter  at bedside

## 2022-05-08 NOTE — Assessment & Plan Note (Signed)
There seems to be acute worsening of patient's dementia with associated behavioral disturbance.  At this time exam is nonfocal and I am going to focus on systemic causes I doubt patient can tolerate an MRI she barely tolerated CAT scan of the head.  See workup for abdominal pain below.

## 2022-05-08 NOTE — ED Notes (Signed)
Pt to room assignment with ED tech and family at this time

## 2022-05-08 NOTE — H&P (Signed)
History and Physical    Patient: [name] DJS:[id number] DOB: 1925/03/12 DOA: 05/08/2022 DOS: the patient was seen and examined on 05/08/2022 PCP: Mikey Kirschner, PA-C  Patient coming from: Home  Chief Complaint:  Chief Complaint  Patient presents with   Altered Mental Status    Pt from ems. Pt lives alone with aid during the day. unwitnessed fall out of bed onto carpet. (Could possibly hit head on nightstand). Pt not on blood thinners. Has not taking her htn meds today. Pt has hx of dementia and does not remember falling. Family at bedside and states caretaker said at around 1pm pt began acting more altered and supposedly was not able to hold onto things.    HPI: [name] is a 89 y.o. female with medical history significant of dementia.  Therefore patient is not able to provide history of present illness at this time.  Patient is accompanied by her granddaughter Aneta Mins who is the major historian for this encounter as well as the provider of CODE STATUS below.  She is also apparently the healthcare proxy for the patient.  She will bring documentation of above when she visits her current next.  However the granddaughter does not actually live with the patient and therefore is still a limited historian  Apparently patient was in her usual state of health till last evening.  Earlier this morning when her home health aide returned, patient was found to be on the floor unable to get back up.  Patient was subsequently helped up and seemed to function okay till around 1 PM.  At the time it was noted that the patient again had a marked decline in functional status and basically remained bedbound and did not get up.  Per usual, patient does get up and move around and interact more.  Patient has been noted to be less interactive today has been referring to her son who had passed away a couple of years ago as well as to her sister who passed away even longer ago.  And she has also been  agitated or upset with the granddaughter.  Which is new.  She has not been able to recognize her granddaughter which is new as well  ER course is notable for patient being diagnosed with urinary tract infection and a medical evaluation is sought.  Patient at this time is complaining of abdominal pain she does not specify where.  There has been no report of patient having vomiting or diarrhea that is acute.  There is no report of fever. Review of Systems: Unable to review all systems due to lack of cooperation from patient. Past Medical History:  Diagnosis Date   Acute UTI 08/21/2015   Bad memory 10/16/2014   Below normal amount of sodium in the blood 10/16/2014   Breast cyst    removed   Diverticulitis    Fall 07/29/2015   Foot pain 12/31/2014   Gallstones    Hypertension    Hyponatremia 01/11/2017   Low sodium levels    Near syncope 07/21/2015   Scalp laceration 07/29/2015   Past Surgical History:  Procedure Laterality Date   ABDOMINAL HYSTERECTOMY  2000   APPENDECTOMY  1940's   bladder tack  1990's   BREAST CYST INCISION AND DRAINAGE Left 1990's   CHOLECYSTECTOMY  09/11/13   Cholangiogram suggested a retained stone ductal dilatation.   ERCP W/ SPHICTEROTOMY  09/12/2013   Recurrent stone identified post cholecystectomy.   NASAL SINUS SURGERY  Social History:  reports that she has never smoked. She has never used smokeless tobacco. She reports that she does not drink alcohol and does not use drugs.  Allergies  Allergen Reactions   Bactrim [Sulfamethoxazole-Trimethoprim] Nausea And Vomiting   Metronidazole Other (See Comments) and Hives    Reaction: unknown Reaction: unknown   Nitrofurantoin Hives    GI upset   Nitrofurantoin Monohyd Macro Other (See Comments)    GI upset   Penicillins Diarrhea, Other (See Comments) and Hives    Has patient had a PCN reaction causing immediate rash, facial/tongue/throat swelling, SOB or lightheadedness with hypotension: Unknown Has patient had a  PCN reaction causing severe rash involving mucus membranes or skin necrosis: Unknown Has patient had a PCN reaction that required hospitalization: Unknown Has patient had a PCN reaction occurring within the last 10 years: Unknown If all of the above answers are "NO", then may proceed with Cephalosporin use. Has patient had a PCN reaction causing immediate rash, facial/tongue/throat swelling, SOB or lightheadedness with hypotension: Unknown Has patient had a PCN reaction causing severe rash involving mucus membranes or skin necrosis: Unknown Has patient had a PCN reaction that required hospitalization: Unknown Has patient had a PCN reaction occurring within the last 10 years: Unknown If all of the above answers are "NO", then may proceed with Cephalosporin use.   Zafirlukast Other (See Comments) and Hives    Reaction: unknown Reaction: unknown   Keflex [Cephalexin] Rash    Family History  Problem Relation Age of Onset   Hypertension Father    CAD Father    Cancer Brother        unknown type    Prior to Admission medications   Medication Sig Start Date End Date Taking? Authorizing Provider  acetaminophen (TYLENOL) 325 MG tablet Take 2 tablets (650 mg total) by mouth every 6 (six) hours as needed for mild pain (or Fever >/= 101). 07/23/15   Gouru, Illene Silver, MD  enalapril (VASOTEC) 10 MG tablet TAKE 1 TABLET (10 MG TOTAL) BY MOUTH DAILY. PLEASE SCHEDULE AN OFFICE VISIT BEFORE ANYMORE REFILLS. 04/07/22   Gwyneth Sprout, FNP  metoprolol tartrate (LOPRESSOR) 25 MG tablet Take 0.5 tablets (12.5 mg total) by mouth 2 (two) times daily. 04/07/22   Gwyneth Sprout, FNP  torsemide [facility]) 20 MG tablet Take in AM every other day 01/16/22   Mikey Kirschner, PA-C    Physical Exam: Vitals:   05/08/22 1527 05/08/22 1538 05/08/22 1539 05/08/22 1540  BP: (!) 151/57   (!) 152/48  Pulse: 60   60  Resp:    13  Temp: 97.7 F (36.5 C)     TempSrc: Oral     SpO2: 99% 96% 98%   Weight:    54.4 kg  Height:     5\' 2"  (1.575 m)   Patient seems to have mild distress that I guess is due to pain, patient is a poor communicator I believe due to her dementia.  Otherwise no immediate distress is apparent. Neurologic exam patient is alert and awake, unable to give a date of birth, does give her name, does not know location or date or time.  Exam is nonfocal Respiratory exam: Bilateral air entry vesicular Cardiovascular exam S1-S2 normal Abdominal exam bowel sounds are present, there is suprapubic tenderness with some guarding as patient removes my hand from there.  Other quadrants do not seem to be affected much Extremities warm without edema No focal back tenderness Data Reviewed:  Results for orders  placed or performed during the hospital encounter of 05/08/22 (from the past 24 hour(s))  Comprehensive metabolic panel     Status: Abnormal   Collection Time: 05/08/22  3:45 PM  Result Value Ref Range   Sodium 139 135 - 145 mmol/L   Potassium 3.6 3.5 - 5.1 mmol/L   Chloride 108 98 - 111 mmol/L   CO2 23 22 - 32 mmol/L   Glucose, Bld 96 70 - 99 mg/dL   BUN 21 8 - 23 mg/dL   Creatinine, Ser 1.25 (H) 0.44 - 1.00 mg/dL   Calcium 9.4 8.9 - 10.3 mg/dL   Total Protein 6.6 6.5 - 8.1 g/dL   Albumin 3.8 3.5 - 5.0 g/dL   AST 22 15 - 41 U/L   ALT 9 0 - 44 U/L   Alkaline Phosphatase 82 38 - 126 U/L   Total Bilirubin 0.7 0.3 - 1.2 mg/dL   GFR, Estimated 40 (L) >60 mL/min   Anion gap 8 5 - 15  Troponin I (High Sensitivity)     Status: None   Collection Time: 05/08/22  3:45 PM  Result Value Ref Range   Troponin I (High Sensitivity) 13 <18 ng/L  CBC with Differential     Status: Abnormal   Collection Time: 05/08/22  3:45 PM  Result Value Ref Range   WBC 6.7 4.0 - 10.5 K/uL   RBC 3.51 (L) 3.87 - 5.11 MIL/uL   Hemoglobin 11.2 (L) 12.0 - 15.0 g/dL   HCT 33.7 (L) 36.0 - 46.0 %   MCV 96.0 80.0 - 100.0 fL   MCH 31.9 26.0 - 34.0 pg   MCHC 33.2 30.0 - 36.0 g/dL   RDW 12.7 11.5 - 15.5 %   Platelets 163 150 -  400 K/uL   nRBC 0.0 0.0 - 0.2 %   Neutrophils Relative % 69 %   Neutro Abs 4.6 1.7 - 7.7 K/uL   Lymphocytes Relative 22 %   Lymphs Abs 1.5 0.7 - 4.0 K/uL   Monocytes Relative 8 %   Monocytes Absolute 0.6 0.1 - 1.0 K/uL   Eosinophils Relative 1 %   Eosinophils Absolute 0.1 0.0 - 0.5 K/uL   Basophils Relative 0 %   Basophils Absolute 0.0 0.0 - 0.1 K/uL   Immature Granulocytes 0 %   Abs Immature Granulocytes 0.02 0.00 - 0.07 K/uL  Urinalysis, Routine w reflex microscopic -Urine, Clean Catch     Status: Abnormal   Collection Time: 05/08/22  3:45 PM  Result Value Ref Range   Color, Urine YELLOW (A) YELLOW   APPearance HAZY (A) CLEAR   Specific Gravity, Urine 1.010 1.005 - 1.030   pH 6.0 5.0 - 8.0   Glucose, UA NEGATIVE NEGATIVE mg/dL   Hgb urine dipstick SMALL (A) NEGATIVE   Bilirubin Urine NEGATIVE NEGATIVE   Ketones, ur NEGATIVE NEGATIVE mg/dL   Protein, ur 30 (A) NEGATIVE mg/dL   Nitrite POSITIVE (A) NEGATIVE   Leukocytes,Ua LARGE (A) NEGATIVE   RBC / HPF 0-5 0 - 5 RBC/hpf   WBC, UA >50 0 - 5 WBC/hpf   Bacteria, UA FEW (A) NONE SEEN   Squamous Epithelial / HPF NONE SEEN 0 - 5 /HPF   Budding Yeast PRESENT   CBG monitoring, ED     Status: Abnormal   Collection Time: 05/08/22  3:51 PM  Result Value Ref Range   Glucose-Capillary 104 (H) 70 - 99 mg/dL      IMPRESSION: 1. No acute fracture. 2. Multilevel degenerative disc and  joint changes as above.   IMPRESSION: 1. No acute intracranial process. 2. Moderate cortical atrophy and mild chronic ischemic white matter changes.   IMPRESSION: No evidence of acute cardiopulmonary disease.  Assessment and Plan: * AMS (altered mental status) There seems to be acute worsening of patient's dementia with associated behavioral disturbance.  At this time exam is nonfocal and I am going to focus on systemic causes I doubt patient can tolerate an MRI she barely tolerated CAT scan of the head.  See workup for abdominal pain  below.  Lower abdominal pain Check bladder scan, check CT abdomen, contrast cannot be given intravenous given patient's chronic kidney disease  Dementia without behavioral disturbance ([place]) In case patient is a danger to herself we will use psychoactive agents  Urinary tract infection Follow-up urine culture, continue with aztreonam      Advance Care Planning:   Code Status: Prior patient is DNR/DNI per healthcare proxy and granddaughter Myra.  She will bring documentation from car when she makes the next trip  Consults: Not applicable  Family Communication: Granddaughter was at the bedside for this encounter  Severity of Illness: The appropriate patient status for this patient is OBSERVATION. Observation status is judged to be reasonable and necessary in order to provide the required intensity of service to ensure the patient's safety. The patient's presenting symptoms, physical exam findings, and initial radiographic and laboratory data in the context of their medical condition is felt to place them at decreased risk for further clinical deterioration. Furthermore, it is anticipated that the patient will be medically stable for discharge from the hospital within 2 midnights of admission.   Author: Gertie Fey, MD 05/08/2022 7:47 PM  For on call review [email or web address].

## 2022-05-08 NOTE — ED Notes (Signed)
Patient is resting comfortably. 

## 2022-05-08 NOTE — Assessment & Plan Note (Signed)
In case patient is a danger to herself we will use psychoactive agents

## 2022-05-08 NOTE — ED Triage Notes (Signed)
Pt from ems. Pt lives alone with aid during the day. unwitnessed fall out of bed onto carpet. (Could possibly hit head on nightstand). Pt not on blood thinners. Has not taking her htn meds today. Pt has hx of dementia and does not remember falling. Family at bedside and states caretaker said at around 1pm pt began acting more altered and supposedly was not able to hold onto things.

## 2022-05-08 NOTE — Assessment & Plan Note (Signed)
Follow-up urine culture, continue with aztreonam

## 2022-05-08 NOTE — ED Notes (Signed)
Pt to CT

## 2022-05-09 DIAGNOSIS — I509 Heart failure, unspecified: Secondary | ICD-10-CM | POA: Diagnosis present

## 2022-05-09 DIAGNOSIS — Z79899 Other long term (current) drug therapy: Secondary | ICD-10-CM | POA: Diagnosis not present

## 2022-05-09 DIAGNOSIS — R4182 Altered mental status, unspecified: Secondary | ICD-10-CM | POA: Diagnosis not present

## 2022-05-09 DIAGNOSIS — Z66 Do not resuscitate: Secondary | ICD-10-CM | POA: Diagnosis present

## 2022-05-09 DIAGNOSIS — I130 Hypertensive heart and chronic kidney disease with heart failure and stage 1 through stage 4 chronic kidney disease, or unspecified chronic kidney disease: Secondary | ICD-10-CM | POA: Diagnosis present

## 2022-05-09 DIAGNOSIS — Z888 Allergy status to other drugs, medicaments and biological substances status: Secondary | ICD-10-CM | POA: Diagnosis not present

## 2022-05-09 DIAGNOSIS — Z9049 Acquired absence of other specified parts of digestive tract: Secondary | ICD-10-CM | POA: Diagnosis not present

## 2022-05-09 DIAGNOSIS — Z880 Allergy status to penicillin: Secondary | ICD-10-CM | POA: Diagnosis not present

## 2022-05-09 DIAGNOSIS — I639 Cerebral infarction, unspecified: Secondary | ICD-10-CM | POA: Diagnosis not present

## 2022-05-09 DIAGNOSIS — Z7189 Other specified counseling: Secondary | ICD-10-CM | POA: Diagnosis not present

## 2022-05-09 DIAGNOSIS — G9341 Metabolic encephalopathy: Secondary | ICD-10-CM | POA: Diagnosis not present

## 2022-05-09 DIAGNOSIS — I10 Essential (primary) hypertension: Secondary | ICD-10-CM | POA: Diagnosis not present

## 2022-05-09 DIAGNOSIS — N184 Chronic kidney disease, stage 4 (severe): Secondary | ICD-10-CM | POA: Diagnosis present

## 2022-05-09 DIAGNOSIS — M1990 Unspecified osteoarthritis, unspecified site: Secondary | ICD-10-CM | POA: Diagnosis present

## 2022-05-09 DIAGNOSIS — Z809 Family history of malignant neoplasm, unspecified: Secondary | ICD-10-CM | POA: Diagnosis not present

## 2022-05-09 DIAGNOSIS — I6389 Other cerebral infarction: Secondary | ICD-10-CM | POA: Diagnosis present

## 2022-05-09 DIAGNOSIS — F03918 Unspecified dementia, unspecified severity, with other behavioral disturbance: Secondary | ICD-10-CM | POA: Diagnosis present

## 2022-05-09 DIAGNOSIS — Z8249 Family history of ischemic heart disease and other diseases of the circulatory system: Secondary | ICD-10-CM | POA: Diagnosis not present

## 2022-05-09 DIAGNOSIS — Z881 Allergy status to other antibiotic agents status: Secondary | ICD-10-CM | POA: Diagnosis not present

## 2022-05-09 DIAGNOSIS — N3000 Acute cystitis without hematuria: Secondary | ICD-10-CM | POA: Diagnosis present

## 2022-05-09 DIAGNOSIS — Z515 Encounter for palliative care: Secondary | ICD-10-CM | POA: Diagnosis not present

## 2022-05-09 DIAGNOSIS — Z87440 Personal history of urinary (tract) infections: Secondary | ICD-10-CM | POA: Diagnosis not present

## 2022-05-09 DIAGNOSIS — F0390 Unspecified dementia without behavioral disturbance: Secondary | ICD-10-CM

## 2022-05-09 DIAGNOSIS — Z23 Encounter for immunization: Secondary | ICD-10-CM | POA: Diagnosis present

## 2022-05-09 DIAGNOSIS — G9349 Other encephalopathy: Secondary | ICD-10-CM | POA: Diagnosis present

## 2022-05-09 DIAGNOSIS — Y92009 Unspecified place in unspecified non-institutional (private) residence as the place of occurrence of the external cause: Secondary | ICD-10-CM | POA: Diagnosis not present

## 2022-05-09 DIAGNOSIS — R1030 Lower abdominal pain, unspecified: Secondary | ICD-10-CM | POA: Diagnosis present

## 2022-05-09 DIAGNOSIS — B961 Klebsiella pneumoniae [K. pneumoniae] as the cause of diseases classified elsewhere: Secondary | ICD-10-CM | POA: Diagnosis present

## 2022-05-09 DIAGNOSIS — R410 Disorientation, unspecified: Secondary | ICD-10-CM | POA: Diagnosis present

## 2022-05-09 DIAGNOSIS — I672 Cerebral atherosclerosis: Secondary | ICD-10-CM | POA: Diagnosis present

## 2022-05-09 DIAGNOSIS — R4701 Aphasia: Secondary | ICD-10-CM | POA: Diagnosis present

## 2022-05-09 DIAGNOSIS — Z90710 Acquired absence of both cervix and uterus: Secondary | ICD-10-CM | POA: Diagnosis not present

## 2022-05-09 DIAGNOSIS — W06XXXA Fall from bed, initial encounter: Secondary | ICD-10-CM | POA: Diagnosis present

## 2022-05-09 LAB — BASIC METABOLIC PANEL
Anion gap: 6 (ref 5–15)
BUN: 18 mg/dL (ref 8–23)
CO2: 24 mmol/L (ref 22–32)
Calcium: 8.9 mg/dL (ref 8.9–10.3)
Chloride: 107 mmol/L (ref 98–111)
Creatinine, Ser: 1.12 mg/dL — ABNORMAL HIGH (ref 0.44–1.00)
GFR, Estimated: 45 mL/min — ABNORMAL LOW (ref >60)
Glucose, Bld: 97 mg/dL (ref 70–99)
Potassium: 3.7 mmol/L (ref 3.5–5.1)
Sodium: 137 mmol/L (ref 135–145)

## 2022-05-09 LAB — GLUCOSE, CAPILLARY: Glucose-Capillary: 183 mg/dL — ABNORMAL HIGH (ref 70–99)

## 2022-05-09 LAB — CBC
HCT: 34.1 % — ABNORMAL LOW (ref 36.0–46.0)
Hemoglobin: 11.4 g/dL — ABNORMAL LOW (ref 12.0–15.0)
MCH: 31.7 pg (ref 26.0–34.0)
MCHC: 33.4 g/dL (ref 30.0–36.0)
MCV: 94.7 fL (ref 80.0–100.0)
Platelets: 145 10*3/uL — ABNORMAL LOW (ref 150–400)
RBC: 3.6 MIL/uL — ABNORMAL LOW (ref 3.87–5.11)
RDW: 12.6 % (ref 11.5–15.5)
WBC: 4.9 10*3/uL (ref 4.0–10.5)
nRBC: 0 % (ref 0.0–0.2)

## 2022-05-09 MED ORDER — SODIUM CHLORIDE 0.9 % IV SOLN
1.0000 g | INTRAVENOUS | Status: DC
  Administered 2022-05-09 – 2022-05-10 (×2): 1 g via INTRAVENOUS
  Filled 2022-05-09: qty 10
  Filled 2022-05-09: qty 1

## 2022-05-09 MED ORDER — HALOPERIDOL LACTATE 5 MG/ML IJ SOLN
1.0000 mg | Freq: Four times a day (QID) | INTRAMUSCULAR | Status: DC | PRN
  Administered 2022-05-09 – 2022-05-10 (×2): 1 mg via INTRAVENOUS
  Filled 2022-05-09 (×2): qty 1

## 2022-05-09 NOTE — Progress Notes (Signed)
[place] [facility] [place] NAME: [name]    MR#:  [id number]  DATE OF BIRTH:  July 27, 1924  SUBJECTIVE:   Patient has dementia at baseline. History is obtained from chart and speaking with Santiago Glad daughter on the phone. Apparently came in after she had a fall found down on the floor by family member. Patient is caregiver from 10 to 3 PM. Walks with walker at home. Somewhat confused today. No fever.   VITALS:  Blood pressure (!) 122/55, pulse (!) 56, temperature 98 F (36.7 C), resp. rate 20, height 5\' 2"  (1.575 m), weight 54.8 kg, SpO2 100 %.  PHYSICAL EXAMINATION:   GENERAL:  89 y.o.-year-old patient with no acute distress.  LUNGS: Normal breath sounds bilaterally, no wheezing CARDIOVASCULAR: S1, S2 normal. No murmur   ABDOMEN: Soft, nontender, nondistended. Bowel sounds present.  EXTREMITIES: No  edema b/l.   Severe DJD NEUROLOGIC: nonfocal  patient is alert , but confused at baseline SKIN: No obvious rash, lesion, or ulcer.   LABORATORY PANEL:  CBC Recent Labs  Lab 05/09/22 0549  WBC 4.9  HGB 11.4*  HCT 34.1*  PLT 145*    Chemistries  Recent Labs  Lab 05/08/22 1545 05/09/22 0549  NA 139 137  K 3.6 3.7  CL 108 107  CO2 23 24  GLUCOSE 96 97  BUN 21 18  CREATININE 1.25* 1.12*  CALCIUM 9.4 8.9  AST 22  --   ALT 9  --   ALKPHOS 82  --   BILITOT 0.7  --    Cardiac Enzymes No results for input(s): "TROPONINI" in the last 168 hours. RADIOLOGY:  CT ABDOMEN PELVIS WO CONTRAST  Result Date: 05/08/2022 CLINICAL DATA:  Unwitnessed fall, dementia EXAM: CT ABDOMEN AND PELVIS WITHOUT CONTRAST TECHNIQUE: Multidetector CT imaging of the abdomen and pelvis was performed following the standard protocol without IV contrast. RADIATION DOSE REDUCTION: This exam was performed according to the departmental dose-optimization program which includes automated exposure control, adjustment of the mA and/or kV according to patient size and/or use  of iterative reconstruction technique. COMPARISON:  03/08/2011 FINDINGS: Lower chest: Mild subpleural reticulation at the lung bases. Hepatobiliary: Unenhanced liver is unremarkable. Status post cholecystectomy. No intrahepatic or extrahepatic ductal dilatation. Pancreas: Coarse parenchymal calcifications in the pancreatic head/uncinate process, likely reflecting sequela of prior/chronic pancreatitis. Associated 13 mm pseudocyst in the uncinate process (series 2/image 33), unchanged from 2012, benign. Spleen: Calcified splenic granulomata. Adrenals/Urinary Tract: Adrenal glands are within normal limits. 18 mm hyperdense lesion in the lateral left upper kidney (series 2/image 33), favoring a benign hemorrhagic cyst, but technically indeterminate. Given the patient's age, no follow-up is recommended. Two nonobstructing left lower pole renal calculi measuring up to 3 mm (series 2/image 40). No hydronephrosis. Bladder is within normal limits. Stomach/Bowel: Stomach is notable for a moderate hiatal hernia. No evidence of bowel obstruction. Appendix is not discretely visualized. Extensive colonic diverticulosis, without evidence of diverticulitis. Vascular/Lymphatic: No evidence of abdominal aortic aneurysm. Atherosclerotic calcifications of the abdominal aorta and branch vessels. No suspicious abdominopelvic lymphadenopathy. Reproductive: Status post hysterectomy.  No adnexal masses. Other: Trace pelvic ascites. No free air. Musculoskeletal: Mild degenerative changes of the visualized thoracolumbar spine. Old left superior and inferior pubic rami fracture deformities. No acute fracture is seen. IMPRESSION: No evidence of traumatic injury to the abdomen/pelvis. Additional ancillary findings as above. Electronically Signed   By: Julian Hy M.D.   On: 05/08/2022 22:35   CT Cervical Spine Wo  Contrast  Result Date: 05/08/2022 CLINICAL DATA:  Neck trauma. EXAM: CT CERVICAL SPINE WITHOUT CONTRAST TECHNIQUE:  Multidetector CT imaging of the cervical spine was performed without intravenous contrast. Multiplanar CT image reconstructions were also generated. RADIATION DOSE REDUCTION: This exam was performed according to the departmental dose-optimization program which includes automated exposure control, adjustment of the mA and/or kV according to patient size and/or use of iterative reconstruction technique. COMPARISON:  None Available. FINDINGS: Despite efforts by the technologist and patient, motion artifact is present on today's exam and could not be eliminated. This reduces exam sensitivity and specificity. Alignment: There is 1-2 mm grade 1 anterolisthesis of C3 on C4, 2 mm grade 1 anterolisthesis of C4 on C5, 2 mm grade 1 anterolisthesis of C5 on C6, 2 mm grade 1 anterolisthesis of C7 on T1, and 2 mm grade 1 anterolisthesis of T1 on T2. Mild levocurvature centered at C4. Skull base and vertebrae: Moderate degenerative changes at the atlantodens interval. Vertebral body heights are maintained. Moderate to severe C5-6 and C6-7 disc space narrowing with high-grade C6-7 endplate sclerosis and endplate osteophytes. Soft tissues and spinal canal: No prevertebral fluid or swelling. No visible canal hematoma. Disc levels: Multilevel degenerative disc changes including disc space narrowing, uncovertebral hypertrophy, and facet joint hypertrophy contribute to mild left C2-3, moderate left and mild right C3-4, moderate to severe right C4-5, mild left C5-6, mild to moderate right and mild left C6-7 neuroforaminal stenosis. Upper chest: There is chronic interlobular septal thickening and scarring within the bilateral lung apices. Other: No cervical chain lymphadenopathy. Moderate atherosclerotic calcifications. IMPRESSION: 1. No acute fracture. 2. Multilevel degenerative disc and joint changes as above. Electronically Signed   By: Yvonne Kendall M.D.   On: 05/08/2022 16:57   CT Head Wo Contrast  Result Date:  05/08/2022 CLINICAL DATA:  Mental status change.  Unknown cause. EXAM: CT HEAD WITHOUT CONTRAST TECHNIQUE: Contiguous axial images were obtained from the base of the skull through the vertex without intravenous contrast. RADIATION DOSE REDUCTION: This exam was performed according to the departmental dose-optimization program which includes automated exposure control, adjustment of the mA and/or kV according to patient size and/or use of iterative reconstruction technique. COMPARISON:  CT brain 07/21/2020 FINDINGS: Brain: There is moderate cortical atrophy, unchanged from prior within normal limits for patient age. The ventricles are normal in configuration. The basilar cisterns are patent. No mass, mass effect, or midline shift. No acute intracranial hemorrhage is seen. No abnormal extra-axial fluid collection. Mild patchy periventricular white matter hypodensities, likely chronic ischemic white matter changes. Preservation of the normal cortical gray-white interface without CT evidence of an acute major vascular territorial cortical based infarction. Vascular: There are high-grade atherosclerotic intracranial calcifications again seen. No hyperdense vessel or unexpected calcification. Skull: Normal. Negative for fracture or focal lesion. Sinuses/Orbits: Status post bilateral lens replacements, partially visualized. The visualized portions of the frontal and ethmoid air cells are clear. The visualized portions of the bilateral mastoid air cells are clear. The visualized paranasal sinuses and mastoid air cells are clear. Other: None. IMPRESSION: 1. No acute intracranial process. 2. Moderate cortical atrophy and mild chronic ischemic white matter changes. Electronically Signed   By: Yvonne Kendall M.D.   On: 05/08/2022 16:42   DG Chest 1 View  Result Date: 05/08/2022 CLINICAL DATA:  Altered mental status. EXAM: CHEST  1 VIEW COMPARISON:  07/21/2020. FINDINGS: Clear lungs. Stable cardiac and mediastinal contours. No  pleural effusion or pneumothorax. Visualized bones and upper abdomen are unremarkable. IMPRESSION: No  evidence of acute cardiopulmonary disease. Electronically Signed   By: Emmit Alexanders M.D.   On: 05/08/2022 16:12    Assessment and Plan ESRA FRANKOWSKI is a 89 y.o. female with history of CHF stage IV renal failure, hypertension, anemia, dementia and as listed in EMR presents to the emergency department for altered mental status.  Per family at bedside, patient was found in the floor this morning by her aide.    She then became altered and confused.  She normally walks without assistance but has not been able to do so this afternoon.  Urine had foul smell.   Acute metabolic encephalopathy/altered mental status in the setting of underlying dementia with UTI. -- CT had negative -- CT abdomen no acute abnormality -- IV Rocephin. Continue to monitor for side effects -- patient received IV fluids. Start PO diet.  Generalized weakness with fall -- seen by physical therapy recommends home health PT  Dementia without behavioral disturbance -- patient has aid on a daily basis along with family health. Discussed with patient's granddaughter and POA. She would like to discuss with social worker regarding ALF options.-- TOC informed  Hypertension continue home meds  Will continue to monitor. Discharge in 1 to 2 days if continues to show improvement.   Procedures: Family communication :grand dter [name] Consults :none CODE STATUS: DNR (per POA) DVT Prophylaxis :enoxaparin Level of care: Med-Surg Status is: Observation The patient remains OBS appropriate and will d/c before 2 midnights.    TOTAL TIME TAKING CARE OF THIS PATIENT: 35 minutes.  >50% time spent on counselling and coordination of care  Note: This dictation was prepared with Dragon dictation along with smaller phrase technology. Any transcriptional errors that result from this process are unintentional.  Fritzi Mandes M.D    [facility]   CC: Primary care physician; Mikey Kirschner, PA-C

## 2022-05-09 NOTE — Progress Notes (Signed)
Called to patient's room by bedside nurse Sangita, who stated patient was unresponsive. Patient noted to be minimally responsive, OOB to chair, with stable vital signs. Patient assisted back to bed from chair by myself and NT. Patient continues to be minimally responsive, but arousable to painful stimuli. Patient was able to verbalize she was "just tired." Of note, patient received 25mg  seroquel around midnight last night. MD made aware and new orders obtained.

## 2022-05-09 NOTE — Progress Notes (Signed)
Patient's great-granddaughter updated at bedside

## 2022-05-09 NOTE — Progress Notes (Signed)
Patient's granddaughter [name] updated at bedside

## 2022-05-09 NOTE — Evaluation (Signed)
Physical Therapy Evaluation Patient Details Name: CARIN SHIPP MRN: [id number] DOB: 11/04/1924 Today's Date: 05/09/2022  History of Present Illness  presented to ER secondary to fall, AMS; admitted for management of UTI.  Clinical Impression  Patient resting in bed upon arrival to room; generally fidgety/restless, voicing need to empty bladder.  Oriented to self only; follows simple commands, but often requires hand-over-hand and constant cuing/facilitation for full task comprehension and completion.  No clinical indicators of pain noted. Currently requiring min assist for bed mobility; min assist for sit/stand, standing balance and basic transfers with RW.  Requires hand-over-hand assist to initiate, guide movement; mild posterior bias with transfer efforts, requiring constant min assist for balance and overall safety.  Additional gait deferred due to toileting needs/incontinent episode and breakfast arrival end of session.  Will continue to assess/progress gait in subsequent sessions as appropriate. Would benefit from skilled PT to address above deficits and promote optimal return to PLOF.; Recommend transition to HHPT upon discharge from acute hospitalization.  Anticipate optimal progress, participation and overall outcomes with return to familiar environment/routine.        Recommendations for follow up therapy are one component of a multi-disciplinary discharge planning process, led by the attending physician.  Recommendations may be updated based on patient status, additional functional criteria and insurance authorization.  Follow Up Recommendations Home health PT      Assistance Recommended at Discharge Frequent or constant Supervision/Assistance  Patient can return home with the following  A little help with walking and/or transfers;A little help with bathing/dressing/bathroom    Equipment Recommendations  (has RW)  Recommendations for Other Services       Functional Status  Assessment Patient has had a recent decline in their functional status and demonstrates the ability to make significant improvements in function in a reasonable and predictable amount of time.     Precautions / Restrictions Precautions Precautions: Fall Restrictions Weight Bearing Restrictions: No      Mobility  Bed Mobility Overal bed mobility: Needs Assistance Bed Mobility: Supine to Sit     Supine to sit: Min assist          Transfers Overall transfer level: Needs assistance Equipment used: Rolling walker (2 wheels) Transfers: Sit to/from Stand, Bed to chair/wheelchair/BSC Sit to Stand: Min assist Stand pivot transfers: Min assist         General transfer comment: hand-over-hand assist to initiate, guide movement; mild posterior bias with transfer efforts, requiring constant min assist for balance and overall safety    Ambulation/Gait               General Gait Details: deferred due to toileting needs/incontinent episode  Stairs            Wheelchair Mobility    Modified Rankin (Stroke Patients Only)       Balance Overall balance assessment: Needs assistance Sitting-balance support: No upper extremity supported, Feet supported Sitting balance-Leahy Scale: Good     Standing balance support: Bilateral upper extremity supported Standing balance-Leahy Scale: Fair                               Pertinent Vitals/Pain Pain Assessment Pain Assessment: No/denies pain    Home Living Family/patient expects to be discharged to:: Private residence Living Arrangements: Alone Available Help at Discharge: Family;Available PRN/intermittently;Personal care attendant (caregiver 10-3 daily; family provides check in/assist before and after) Type of Home: House  Home Equipment: Conservation officer, nature (2 wheels)      Prior Function               Mobility Comments: Per physician (obtained through family), patient ambulatory for  household distances with RW and +1 assist; assist from caregiver/family for ADLs/iADLs       Hand Dominance   Dominant Hand: Right    Extremity/Trunk Assessment   Upper Extremity Assessment Upper Extremity Assessment: Generalized weakness (grossly 4/5 throughout; significant arthritic changes to fingers of bilat hands)    Lower Extremity Assessment Lower Extremity Assessment: Generalized weakness (grossly 4/5 throughout)       Communication   Communication: HOH  Cognition Arousal/Alertness: Awake/alert Behavior During Therapy: WFL for tasks assessed/performed Overall Cognitive Status: No family/caregiver present to determine baseline cognitive functioning                                 General Comments: Oriented to self only; follows commands, but requires constant cuing/redirection for task at hand        General Comments      Exercises Other Exercises Other Exercises: Toilet transfer, SPT with RW, min assist; sit/stand and standing balance from [facility] with RW, min assist; hygiene/peri-care, dep assist. Other Exercises: Assisted with clothing change, hygiene after incontinent episode, max/total assist.   Assessment/Plan    PT Assessment Patient needs continued PT services  PT Problem List Decreased strength;Decreased activity tolerance;Decreased balance;Decreased mobility;Decreased coordination;Decreased cognition;Decreased knowledge of use of DME;Decreased safety awareness;Decreased knowledge of precautions;Cardiopulmonary status limiting activity       PT Treatment Interventions DME instruction;Gait training;Functional mobility training;Therapeutic exercise;Balance training;Cognitive remediation;Therapeutic activities;Patient/family education    PT Goals (Current goals can be found in the Care Plan section)  Acute Rehab PT Goals Patient Stated Goal: "i need to pee" PT Goal Formulation: With patient Time For Goal Achievement: 05/23/22 Potential to  Achieve Goals: Fair    Frequency Min 2X/week     Co-evaluation               AM-PAC PT "6 Clicks" Mobility  Outcome Measure Help needed turning from your back to your side while in a flat bed without using bedrails?: None Help needed moving from lying on your back to sitting on the side of a flat bed without using bedrails?: A Little Help needed moving to and from a bed to a chair (including a wheelchair)?: A Little Help needed standing up from a chair using your arms (e.g., wheelchair or bedside chair)?: A Little Help needed to walk in hospital room?: A Little Help needed climbing 3-5 steps with a railing? : A Lot 6 Click Score: 18    End of Session Equipment Utilized During Treatment: Gait belt Activity Tolerance: Patient tolerated treatment well Patient left: in chair;with call bell/phone within reach;with chair alarm set Nurse Communication: Mobility status PT Visit Diagnosis: Muscle weakness (generalized) (M62.81);Difficulty in walking, not elsewhere classified (R26.2)    Time: 8546-2703 PT Time Calculation (min) (ACUTE ONLY): 20 min   Charges:   PT Evaluation $PT Eval Moderate Complexity: 1 Mod PT Treatments $Therapeutic Activity: 8-22 mins        Sirinity Outland H. Owens Shark, PT, DPT, NCS 05/09/22, 10:47 AM [phone]

## 2022-05-10 DIAGNOSIS — N3000 Acute cystitis without hematuria: Secondary | ICD-10-CM | POA: Diagnosis not present

## 2022-05-10 DIAGNOSIS — G9341 Metabolic encephalopathy: Secondary | ICD-10-CM | POA: Diagnosis not present

## 2022-05-10 DIAGNOSIS — I10 Essential (primary) hypertension: Secondary | ICD-10-CM | POA: Diagnosis not present

## 2022-05-10 DIAGNOSIS — F0390 Unspecified dementia without behavioral disturbance: Secondary | ICD-10-CM | POA: Diagnosis not present

## 2022-05-10 MED ORDER — CEFDINIR 300 MG PO CAPS
300.0000 mg | ORAL_CAPSULE | Freq: Every day | ORAL | Status: DC
  Filled 2022-05-10: qty 1

## 2022-05-10 NOTE — Progress Notes (Cosign Needed Addendum)
Physical Therapy Treatment Patient Details Name: [name] MRN: [id number] DOB: 1924-08-18 Today's Date: 05/10/2022   History of Present Illness presented to ER secondary to fall, AMS; admitted for management of UTI.    PT Comments    Pt lightly sleeping but awakens to voice.  She is assisted to EOB with mod/max a and heavy verbal and tactile cues.  Pt does not resist movement but she does not necessarily help.  Steady in sitting.  She is able to stand with min/mod a x 1 and take a few steps to commode with min/mod a x 1 and heavy cues. Post lean and poor quality steps that appear to fatigue prior to fully turning and needs assist from writer to guide to bedside commode.   Inc of urine during transfer and seems unaware.  Does not void on commode.  She is assisted to recliner at bedside after care with stand pivot transfer with mod a x 1.  She is able to step for transfer when given time.  Unable to progress gait due to quality of movement and general inability to follow meaningful cues at this time.  Pt does mumble during session but no real conversation during session.  Set up in chair as lunch tray arrives at end of session.  RN in room at end of session and stated she did receive Haldol last night and was very lethargic this am.   Unsure if increased need for assist is related to medicine or general decline.  Per chart pt had 5 hours a day aide service with check in assist from family at other times.  Appears pt was able to walk on her own prior to admission.  Discussed with MD who relays family concern for discharge home with HHPT.  Given apparent decline in mobility and PLOF and inability to progress gait this session, recommendations were changed to SNF.  Pt would benefit from rehab in attempt to get pt to PLOF.  If pt is unable to make progress to return to baseline, a conversation with family regarding long term care plan is appropriate.  Pt will either need increased 24 hour support for  home upon discharge or transition to long term care.  Considering pt's PLOF it is reasonable to try rehab at this time.  Will adjust discharge recommendations to SNF to initiate process but if pt does improve prior to discharge will re-visit and adjust back to HHPT if appropriate.  Will discuss with MD and TOC.   Recommendations for follow up therapy are one component of a multi-disciplinary discharge planning process, led by the attending physician.  Recommendations may be updated based on patient status, additional functional criteria and insurance authorization.  Follow Up Recommendations  Skilled nursing-short term rehab (<3 hours/day)     Assistance Recommended at Discharge Frequent or constant Supervision/Assistance  Patient can return home with the following A little help with walking and/or transfers;A little help with bathing/dressing/bathroom;Assistance with cooking/housework;Assistance with feeding;Direct supervision/assist for medications management;Direct supervision/assist for financial management;Help with stairs or ramp for entrance;Assist for transportation   Equipment Recommendations  Rolling walker (2 wheels);BSC/3in1;Wheelchair (measurements PT)    Recommendations for Other Services       Precautions / Restrictions Precautions Precautions: Fall Restrictions Weight Bearing Restrictions: No     Mobility  Bed Mobility Overal bed mobility: Needs Assistance Bed Mobility: Supine to Sit     Supine to sit: Mod assist, Max assist     General bed mobility comments: needs increased assist  today to initiate task.  does not resist but does not necessarily help much    Transfers Overall transfer level: Needs assistance Equipment used: Rolling walker (2 wheels), None Transfers: Sit to/from Stand, Bed to chair/wheelchair/BSC Sit to Stand: Min assist Stand pivot transfers: Min assist, Mod assist Step pivot transfers: Min assist, Mod assist       General transfer  comment: hand-over-hand assist to initiate, guide movement; mild posterior bias with transfer efforts, requiring constant min assist progressing to mod a for balance and overall safety    Ambulation/Gait Ambulation/Gait assistance: Min assist, Mod assist Gait Distance (Feet): 2 Feet Assistive device: Rolling walker (2 wheels) Gait Pattern/deviations: Step-to pattern Gait velocity: decreased     General Gait Details: poor quality steps to chair   Stairs             Wheelchair Mobility    Modified Rankin (Stroke Patients Only)       Balance Overall balance assessment: Needs assistance Sitting-balance support: No upper extremity supported, Feet supported Sitting balance-Leahy Scale: Fair     Standing balance support: Bilateral upper extremity supported Standing balance-Leahy Scale: Poor Standing balance comment: +1 hands on assist at all times                            Cognition Arousal/Alertness: Awake/alert Behavior During Therapy: WFL for tasks assessed/performed, Flat affect Overall Cognitive Status: No family/caregiver present to determine baseline cognitive functioning                                 General Comments: mumbles during session with  no real meaningful conversation        Exercises Other Exercises Other Exercises: unable to follow meaningful cues for ex's in any position Other Exercises: Assisted with clothing change, hygiene after incontinent episode, max/total assist.    General Comments        Pertinent Vitals/Pain Pain Assessment Pain Assessment: No/denies pain    Home Living                          Prior Function            PT Goals (current goals can now be found in the care plan section) Progress towards PT goals: Progressing toward goals    Frequency    Min 2X/week      PT Plan Discharge plan needs to be updated    Co-evaluation              AM-PAC PT "6 Clicks"  Mobility   Outcome Measure  Help needed turning from your back to your side while in a flat bed without using bedrails?: A Little Help needed moving from lying on your back to sitting on the side of a flat bed without using bedrails?: A Lot Help needed moving to and from a bed to a chair (including a wheelchair)?: A Lot Help needed standing up from a chair using your arms (e.g., wheelchair or bedside chair)?: A Lot Help needed to walk in hospital room?: A Lot Help needed climbing 3-5 steps with a railing? : Total 6 Click Score: 12    End of Session Equipment Utilized During Treatment: Gait belt Activity Tolerance: Other (comment) Patient left: in chair;with call bell/phone within reach;with chair alarm set;with nursing/sitter in room Nurse Communication: Mobility status PT Visit Diagnosis: Muscle  weakness (generalized) (M62.81);Difficulty in walking, not elsewhere classified (R26.2)     Time: 8473-0856 PT Time Calculation (min) (ACUTE ONLY): 15 min  Charges:  $Therapeutic Activity: 8-22 mins                   Anber Mckiver, PTA 05/10/22, 1:50 PM

## 2022-05-10 NOTE — Progress Notes (Signed)
[facility] NAME: [name]    MR#:  [id number]  DATE OF BIRTH:  02-01-1925  SUBJECTIVE:   Patient has dementia at baseline.  Awake this am. Ate all her breakfast this morning. Calm No fever   VITALS:  Blood pressure (!) 148/63, pulse 66, temperature 98.4 F (36.9 C), resp. rate 16, height 5\' 2"  (1.575 m), weight 54.8 kg, SpO2 97 %.  PHYSICAL EXAMINATION:   GENERAL:  89 y.o.-year-old patient with no acute distress.  LUNGS: Normal breath sounds bilaterally, no wheezing CARDIOVASCULAR: S1, S2 normal. No murmur   ABDOMEN: Soft Bowel sounds present.  EXTREMITIES:  Severe DJD NEUROLOGIC: nonfocal  patient is alert , but confused at baseline Moves extremities well spontaneously  LABORATORY PANEL:  CBC Recent Labs  Lab 05/09/22 0549  WBC 4.9  HGB 11.4*  HCT 34.1*  PLT 145*     Chemistries  Recent Labs  Lab 05/08/22 1545 05/09/22 0549  NA 139 137  K 3.6 3.7  CL 108 107  CO2 23 24  GLUCOSE 96 97  BUN 21 18  CREATININE 1.25* 1.12*  CALCIUM 9.4 8.9  AST 22  --   ALT 9  --   ALKPHOS 82  --   BILITOT 0.7  --      RADIOLOGY:  CT ABDOMEN PELVIS WO CONTRAST  Result Date: 05/08/2022 CLINICAL DATA:  Unwitnessed fall, dementia EXAM: CT ABDOMEN AND PELVIS WITHOUT CONTRAST TECHNIQUE: Multidetector CT imaging of the abdomen and pelvis was performed following the standard protocol without IV contrast. RADIATION DOSE REDUCTION: This exam was performed according to the departmental dose-optimization program which includes automated exposure control, adjustment of the mA and/or kV according to patient size and/or use of iterative reconstruction technique. COMPARISON:  03/08/2011 FINDINGS: Lower chest: Mild subpleural reticulation at the lung bases. Hepatobiliary: Unenhanced liver is unremarkable. Status post cholecystectomy. No intrahepatic or extrahepatic ductal dilatation. Pancreas: Coarse parenchymal calcifications in the  pancreatic head/uncinate process, likely reflecting sequela of prior/chronic pancreatitis. Associated 13 mm pseudocyst in the uncinate process (series 2/image 33), unchanged from 2012, benign. Spleen: Calcified splenic granulomata. Adrenals/Urinary Tract: Adrenal glands are within normal limits. 18 mm hyperdense lesion in the lateral left upper kidney (series 2/image 33), favoring a benign hemorrhagic cyst, but technically indeterminate. Given the patient's age, no follow-up is recommended. Two nonobstructing left lower pole renal calculi measuring up to 3 mm (series 2/image 40). No hydronephrosis. Bladder is within normal limits. Stomach/Bowel: Stomach is notable for a moderate hiatal hernia. No evidence of bowel obstruction. Appendix is not discretely visualized. Extensive colonic diverticulosis, without evidence of diverticulitis. Vascular/Lymphatic: No evidence of abdominal aortic aneurysm. Atherosclerotic calcifications of the abdominal aorta and branch vessels. No suspicious abdominopelvic lymphadenopathy. Reproductive: Status post hysterectomy.  No adnexal masses. Other: [name] pelvic ascites. No free air. Musculoskeletal: Mild degenerative changes of the visualized thoracolumbar spine. Old left superior and inferior pubic rami fracture deformities. No acute fracture is seen. IMPRESSION: No evidence of traumatic injury to the abdomen/pelvis. Additional ancillary findings as above. Electronically Signed   By: Julian Hy M.D.   On: 05/08/2022 22:35   CT Cervical Spine Wo Contrast  Result Date: 05/08/2022 CLINICAL DATA:  Neck trauma. EXAM: CT CERVICAL SPINE WITHOUT CONTRAST TECHNIQUE: Multidetector CT imaging of the cervical spine was performed without intravenous contrast. Multiplanar CT image reconstructions were also generated. RADIATION DOSE REDUCTION: This exam was performed according to the departmental dose-optimization program which includes automated exposure control, adjustment  of the mA  and/or kV according to patient size and/or use of iterative reconstruction technique. COMPARISON:  None Available. FINDINGS: Despite efforts by the technologist and patient, motion artifact is present on today's exam and could not be eliminated. This reduces exam sensitivity and specificity. Alignment: There is 1-2 mm grade 1 anterolisthesis of C3 on C4, 2 mm grade 1 anterolisthesis of C4 on C5, 2 mm grade 1 anterolisthesis of C5 on C6, 2 mm grade 1 anterolisthesis of C7 on T1, and 2 mm grade 1 anterolisthesis of T1 on T2. Mild levocurvature centered at C4. Skull base and vertebrae: Moderate degenerative changes at the atlantodens interval. Vertebral body heights are maintained. Moderate to severe C5-6 and C6-7 disc space narrowing with high-grade C6-7 endplate sclerosis and endplate osteophytes. Soft tissues and spinal canal: No prevertebral fluid or swelling. No visible canal hematoma. Disc levels: Multilevel degenerative disc changes including disc space narrowing, uncovertebral hypertrophy, and facet joint hypertrophy contribute to mild left C2-3, moderate left and mild right C3-4, moderate to severe right C4-5, mild left C5-6, mild to moderate right and mild left C6-7 neuroforaminal stenosis. Upper chest: There is chronic interlobular septal thickening and scarring within the bilateral lung apices. Other: No cervical chain lymphadenopathy. Moderate atherosclerotic calcifications. IMPRESSION: 1. No acute fracture. 2. Multilevel degenerative disc and joint changes as above. Electronically Signed   By: Yvonne Kendall M.D.   On: 05/08/2022 16:57   CT Head Wo Contrast  Result Date: 05/08/2022 CLINICAL DATA:  Mental status change.  Unknown cause. EXAM: CT HEAD WITHOUT CONTRAST TECHNIQUE: Contiguous axial images were obtained from the base of the skull through the vertex without intravenous contrast. RADIATION DOSE REDUCTION: This exam was performed according to the departmental dose-optimization program which  includes automated exposure control, adjustment of the mA and/or kV according to patient size and/or use of iterative reconstruction technique. COMPARISON:  CT brain 07/21/2020 FINDINGS: Brain: There is moderate cortical atrophy, unchanged from prior within normal limits for patient age. The ventricles are normal in configuration. The basilar cisterns are patent. No mass, mass effect, or midline shift. No acute intracranial hemorrhage is seen. No abnormal extra-axial fluid collection. Mild patchy periventricular white matter hypodensities, likely chronic ischemic white matter changes. Preservation of the normal cortical gray-white interface without CT evidence of an acute major vascular territorial cortical based infarction. Vascular: There are high-grade atherosclerotic intracranial calcifications again seen. No hyperdense vessel or unexpected calcification. Skull: Normal. Negative for fracture or focal lesion. Sinuses/Orbits: Status post bilateral lens replacements, partially visualized. The visualized portions of the frontal and ethmoid air cells are clear. The visualized portions of the bilateral mastoid air cells are clear. The visualized paranasal sinuses and mastoid air cells are clear. Other: None. IMPRESSION: 1. No acute intracranial process. 2. Moderate cortical atrophy and mild chronic ischemic white matter changes. Electronically Signed   By: Yvonne Kendall M.D.   On: 05/08/2022 16:42   DG Chest 1 View  Result Date: 05/08/2022 CLINICAL DATA:  Altered mental status. EXAM: CHEST  1 VIEW COMPARISON:  07/21/2020. FINDINGS: Clear lungs. Stable cardiac and mediastinal contours. No pleural effusion or pneumothorax. Visualized bones and upper abdomen are unremarkable. IMPRESSION: No evidence of acute cardiopulmonary disease. Electronically Signed   By: Emmit Alexanders M.D.   On: 05/08/2022 16:12    Assessment and Plan [name] is a 89 y.o. female with history of CHF stage IV renal failure,  hypertension, anemia, dementia and as listed in EMR presents to the emergency department for  altered mental status.  Per family at bedside, patient was found in the floor this morning by her aide.    She then became altered and confused.  She normally walks with walker. Urine had foul smell.  Acute metabolic encephalopathy/altered mental status in the setting of underlying dementia with UTI. -- CT had negative -- CT abdomen no acute abnormality -- IV Rocephin.--tolerating well. Change to cefdinir po daily  -- po intake better today  Generalized weakness with fall -- seen by physical therapy recommends home health PT --will have PT see today for ambulation. Grand-dter request pt to go to rehab. She understands at rehab ~2-3 hours is required everyday. Await PT re-eval.  Dementia without behavioral disturbance -- patient has aide on a daily basis along with family health. Discussed with patient's granddaughter and POA.   Hypertension continue home meds   Family communication :grand dter Robin Consults :none CODE STATUS: DNR (per POA) DVT Prophylaxis :enoxaparin Level of care: Med-Surg Await PT re-eval and plan discharge accordingly   TOTAL TIME TAKING CARE OF THIS PATIENT: 35 minutes.  >50% time spent on counselling and coordination of care  Note: This dictation was prepared with Dragon dictation along with smaller phrase technology. Any transcriptional errors that result from this process are unintentional.  Fritzi Mandes M.D    [facility]   CC: Primary care physician; Mikey Kirschner, PA-C

## 2022-05-10 NOTE — TOC Initial Note (Signed)
Transition of Care [facility]) - Initial/Assessment Note    Patient Details  Name: [name] MRN: [id number] Date of Birth: 04-08-1925  Transition of Care [facility]) CM/SW Contact:    Magnus Ivan, LCSW Phone Number: 05/10/2022, 2:47 PM  Clinical Narrative:               CSW spoke with granddaughter Robin via phone. PT currently recommends SNF. Patient lives alone. She has an aide from [employer] from 10-2 daily. Her nephew checks on her once in the morning and once around 430 pm to make sure she takes her medications.  PCP is Mikey Kirschner. [facility] [place]. Patient has a rolling walker.  [name] is not sure of [facility] history. Patient went to [facility] in the past. Family or Aide provide transportation to appts.  [name] states they are agreeable to SNF. SNF work up started.    Expected Discharge Plan: Skilled Nursing Facility Barriers to Discharge: Continued Medical Work up   Patient Goals and CMS Choice Patient states their goals for this hospitalization and ongoing recovery are:: SNF CMS Medicare.gov Compare Post Acute Care list provided to:: Patient Represenative (must comment) Choice offered to / list presented to : NA      Expected Discharge Plan and Services       Living arrangements for the past 2 months: Single Family Home                                      Prior Living Arrangements/Services Living arrangements for the past 2 months: Single Family Home Lives with:: Self Patient language and need for interpreter reviewed:: Yes Do you feel safe going back to the place where you live?: Yes      Need for Family Participation in Patient Care: Yes (Comment) Care giver support system in place?: Yes (comment) Current home services: DME, Homehealth aide Criminal Activity/Legal Involvement Pertinent to Current Situation/Hospitalization: No - Comment as needed  Activities of Daily Living Home Assistive Devices/Equipment: Walker (specify  type) ADL Screening (condition at time of admission) Patient's cognitive ability adequate to safely complete daily activities?: No Is the patient deaf or have difficulty hearing?: No Does the patient have difficulty seeing, even when wearing glasses/contacts?: No Does the patient have difficulty concentrating, remembering, or making decisions?: Yes Patient able to express need for assistance with ADLs?: Yes Does the patient have difficulty dressing or bathing?: Yes Independently performs ADLs?: No Communication: Independent Dressing (OT): Needs assistance Is this a change from baseline?: Change from baseline, expected to last <3days Grooming: Independent Feeding: Independent Bathing: Needs assistance Is this a change from baseline?: Pre-admission baseline Toileting: Needs assistance Is this a change from baseline?: Change from baseline, expected to last >3days In/Out Bed: Needs assistance Is this a change from baseline?: Change from baseline, expected to last >3 days Walks in Home: Needs assistance Is this a change from baseline?: Change from baseline, expected to last >3 days Does the patient have difficulty walking or climbing stairs?: Yes Weakness of Legs: Both Weakness of Arms/Hands: None  Permission Sought/Granted Permission sought to share information with : Facility Art therapist granted to share information with : Yes, Verbal Permission Granted (by granddaughter [name])     Permission granted to share info w AGENCY: SNFs        Emotional Assessment         Alcohol / Substance Use:  Not Applicable Psych Involvement: No (comment)  Admission diagnosis:  Disorientation [R41.0] Acute cystitis without hematuria [N30.00] AMS (altered mental status) [R41.82] Patient Active Problem List   Diagnosis Date Noted   Acute metabolic encephalopathy 90/24/0973   AMS (altered mental status) 05/08/2022   Dementia without behavioral disturbance ([place]) 05/08/2022    Lower abdominal pain 05/08/2022   Change in consistency of stool 09/25/2021   Hypertensive heart and kidney disease with HF and with CKD stage III ([place]) 09/18/2021   Anemia 09/18/2021   Incontinence of feces 09/18/2021   Acute renal failure superimposed on stage 4 chronic kidney disease ([place]) 11/22/2020   Chronic respiratory failure with hypoxia ([place]) 07/29/2020   Loss of memory 07/29/2020   History of depression 07/25/2020   Closed fracture of left superior pubic ramus ([place]) 07/21/2020   Closed pelvic fracture ([place]) 07/21/2020   Chronic diastolic CHF (congestive heart failure) ([place]) 07/21/2020   Fall at home, initial encounter 07/21/2020   CKD (chronic kidney disease), stage IIIa 07/21/2020   Elevated CK 07/21/2020   Syncope 08/17/2017   Arthritis of both hands 01/14/2016   Urinary tract infection 08/21/2015   Anxiety 10/17/2014   Allergic rhinitis 10/16/2014   Airway hyperreactivity 10/16/2014   Biliary calculi, common bile duct 10/16/2014   Clinical depression 10/16/2014   Acquired clavicle deformity 53/29/9242   Clicking shoulder 68/34/1962   Primary hypertension 10/16/2014   H/O gastrointestinal hemorrhage 10/16/2014   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 [place] [address] Phone: [phone] Fax: [phone]  [id number] [place], [place] [address] Phone: [phone] Fax:  [phone]     Social Determinants of Health (SDOH) Social History: [place]: No Food Insecurity (05/08/2022)  Housing: Low Risk  (05/08/2022)  Transportation Needs: No Transportation Needs (05/08/2022)  Utilities: Not At Risk (05/08/2022)  Alcohol Screen: Low Risk  (08/18/2021)  Depression (PHQ2-9): Medium Risk (08/18/2021)  Financial Resource Strain: Low Risk  (05/06/2021)  Physical Activity: Inactive (05/06/2021)  Social Connections: Socially Isolated (05/06/2021)  Stress: No Stress Concern Present (05/06/2021)  Tobacco Use: Low Risk  (05/08/2022)   SDOH Interventions:     Readmission Risk Interventions     No data to display

## 2022-05-10 NOTE — NC FL2 (Signed)
[place] LEVEL OF CARE FORM     IDENTIFICATION  Patient Name: [name] Birthdate: [birth year] Sex: female Admission Date (Current Location): 05/08/2022  [facility] and [place] Number:  Engineering geologist and Address:  [facility], [address], [place], [place] [address]      Provider Number: [id number]  Attending Physician Name and Address:  Fritzi Mandes, MD  Relative Name and Phone Number:  Molli Hazard" (Granddaughter) [phone] (Mobile)    Current Level of Care: Hospital Recommended Level of Care: [place] Prior Approval Number:    Date Approved/Denied:   PASRR Number: [id number]  Discharge Plan: Home    Current Diagnoses: Patient Active Problem List   Diagnosis Date Noted   Acute metabolic encephalopathy 93/71/6967   AMS (altered mental status) 05/08/2022   Dementia without behavioral disturbance ([place]) 05/08/2022   Lower abdominal pain 05/08/2022   Change in consistency of stool 09/25/2021   Hypertensive heart and kidney disease with HF and with CKD stage III ([place]) 09/18/2021   Anemia 09/18/2021   Incontinence of feces 09/18/2021   Acute renal failure superimposed on stage 4 chronic kidney disease ([place]) 11/22/2020   Chronic respiratory failure with hypoxia ([place]) 07/29/2020   Loss of memory 07/29/2020   History of depression 07/25/2020   Closed fracture of left superior pubic ramus ([place]) 07/21/2020   Closed pelvic fracture ([place]) 07/21/2020   Chronic diastolic CHF (congestive heart failure) ([place]) 07/21/2020   Fall at home, initial encounter 07/21/2020   CKD (chronic kidney disease), stage IIIa 07/21/2020   Elevated CK 07/21/2020   Syncope 08/17/2017   Arthritis of both hands 01/14/2016   Urinary tract infection 08/21/2015   Anxiety 10/17/2014   Allergic rhinitis 10/16/2014   Airway hyperreactivity 10/16/2014   Biliary calculi, common bile duct 10/16/2014   Clinical depression  10/16/2014   Acquired clavicle deformity 89/38/1017   Clicking shoulder 51/05/5850   Primary hypertension 10/16/2014   H/O gastrointestinal hemorrhage 10/16/2014   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   Insomnia 10/16/2014   Age-related osteoporosis with current pathological fracture with routine healing 10/16/2014   Cyst of pancreas 10/16/2014   Temporary cerebral vascular dysfunction 10/16/2014   Other constipation 03/07/2014   Right groin pain 03/07/2014   Internal hemorrhoid 03/07/2014   Gallstones, common bile duct 08/29/2013   Cholelithiasis 08/29/2013   CYST AND PSEUDOCYST OF PANCREAS 03/12/2008    Orientation RESPIRATION BLADDER Height & Weight        Normal Incontinent, External catheter Weight: 120 lb 14.4 oz (54.8 kg) Height:  5\' 2"  (157.5 cm)  BEHAVIORAL SYMPTOMS/MOOD NEUROLOGICAL BOWEL NUTRITION STATUS        Diet (regular)  AMBULATORY STATUS COMMUNICATION OF NEEDS Skin   Limited Assist Verbally Other (Comment) (erythema)                       Personal Care Assistance Level of Assistance  Bathing, Feeding, Dressing Bathing Assistance: Limited assistance Feeding assistance: Limited assistance Dressing Assistance: Limited assistance     Functional Limitations Info             SPECIAL CARE FACTORS FREQUENCY  PT (By licensed PT), OT (By licensed OT)     PT Frequency: 5 times per week OT Frequency: 5 times per week            Contractures      Additional Factors Info  Code Status, Allergies Code Status Info: DNR Allergies Info: Allergies: Bactrim (Sulfamethoxazole-trimethoprim), Metronidazole,  Nitrofurantoin, Nitrofurantoin Monohyd Macro, Penicillins, Zafirlukast, Keflex (Cephalexin)           Current Medications (05/10/2022):  This is the current hospital active medication list Current Facility-Administered Medications  Medication Dose Route Frequency Provider Last Rate Last Admin   acetaminophen (TYLENOL) tablet 650 mg  650 mg Oral Q6H PRN  Gertie Fey, MD       [START ON 05/11/2022] cefdinir (OMNICEF) capsule 300 mg  300 mg Oral Daily Fritzi Mandes, MD       enalapril (VASOTEC) tablet 5 mg  5 mg Oral Daily Gertie Fey, MD   5 mg at 05/10/22 0947   enoxaparin (LOVENOX) injection 30 mg  30 mg Subcutaneous Q24H Gertie Fey, MD   30 mg at 05/09/22 2130   influenza vaccine adjuvanted (FLUAD) injection 0.5 mL  0.5 mL Intramuscular Tomorrow-1000 Gertie Fey, MD       metoprolol tartrate (LOPRESSOR) tablet 12.5 mg  12.5 mg Oral BID Gertie Fey, MD   12.5 mg at 05/10/22 0944   sodium chloride flush (NS) 0.9 % injection 3 mL  3 mL Intravenous Q12H Gertie Fey, MD   3 mL at 05/10/22 1031     Discharge Medications: Please see discharge summary for a list of discharge medications.  Relevant Imaging Results:  Relevant Lab Results:   Additional Information SS #: [id number]  [place], LCSW

## 2022-05-11 ENCOUNTER — Inpatient Hospital Stay: Payer: PPO

## 2022-05-11 DIAGNOSIS — R4182 Altered mental status, unspecified: Secondary | ICD-10-CM | POA: Diagnosis not present

## 2022-05-11 LAB — CBC WITH DIFFERENTIAL/PLATELET
Abs Immature Granulocytes: 0.07 10*3/uL (ref 0.00–0.07)
Basophils Absolute: 0 10*3/uL (ref 0.0–0.1)
Basophils Relative: 0 %
Eosinophils Absolute: 0.1 10*3/uL (ref 0.0–0.5)
Eosinophils Relative: 0 %
HCT: 38.8 % (ref 36.0–46.0)
Hemoglobin: 12.9 g/dL (ref 12.0–15.0)
Immature Granulocytes: 1 %
Lymphocytes Relative: 15 %
Lymphs Abs: 2.2 10*3/uL (ref 0.7–4.0)
MCH: 31.8 pg (ref 26.0–34.0)
MCHC: 33.2 g/dL (ref 30.0–36.0)
MCV: 95.6 fL (ref 80.0–100.0)
Monocytes Absolute: 2.9 10*3/uL — ABNORMAL HIGH (ref 0.1–1.0)
Monocytes Relative: 21 %
Neutro Abs: 8.8 10*3/uL — ABNORMAL HIGH (ref 1.7–7.7)
Neutrophils Relative %: 63 %
Platelets: 144 10*3/uL — ABNORMAL LOW (ref 150–400)
RBC: 4.06 MIL/uL (ref 3.87–5.11)
RDW: 12.5 % (ref 11.5–15.5)
Smear Review: NORMAL
WBC: 14.1 10*3/uL — ABNORMAL HIGH (ref 4.0–10.5)
nRBC: 0 % (ref 0.0–0.2)

## 2022-05-11 LAB — URINALYSIS, COMPLETE (UACMP) WITH MICROSCOPIC
Bacteria, UA: NONE SEEN
Bilirubin Urine: NEGATIVE
Glucose, UA: NEGATIVE mg/dL
Ketones, ur: NEGATIVE mg/dL
Nitrite: NEGATIVE
Protein, ur: 30 mg/dL — AB
Specific Gravity, Urine: 1.017 (ref 1.005–1.030)
pH: 5 (ref 5.0–8.0)

## 2022-05-11 LAB — BASIC METABOLIC PANEL
Anion gap: 12 (ref 5–15)
BUN: 33 mg/dL — ABNORMAL HIGH (ref 8–23)
CO2: 19 mmol/L — ABNORMAL LOW (ref 22–32)
Calcium: 8.8 mg/dL — ABNORMAL LOW (ref 8.9–10.3)
Chloride: 100 mmol/L (ref 98–111)
Creatinine, Ser: 1.3 mg/dL — ABNORMAL HIGH (ref 0.44–1.00)
GFR, Estimated: 38 mL/min — ABNORMAL LOW (ref >60)
Glucose, Bld: 87 mg/dL (ref 70–99)
Potassium: 4 mmol/L (ref 3.5–5.1)
Sodium: 131 mmol/L — ABNORMAL LOW (ref 135–145)

## 2022-05-11 LAB — URINE CULTURE: Culture: >=100000 — AB

## 2022-05-11 MED ORDER — LACTATED RINGERS IV SOLN: INTRAVENOUS | Status: DC

## 2022-05-11 MED ORDER — SODIUM CHLORIDE 0.9 % IV SOLN
1.0000 g | INTRAVENOUS | Status: AC
  Administered 2022-05-11 – 2022-05-12 (×2): 1 g via INTRAVENOUS
  Filled 2022-05-11 (×2): qty 1

## 2022-05-11 NOTE — Progress Notes (Signed)
PROGRESS NOTE    [name]  WNU:[id number] DOB: [birth year] DOA: 05/08/2022 PCP: Mikey Kirschner, PA-C    Brief Narrative:  [name] is a 89 y.o. female with medical history significant of dementia.  Therefore patient is not able to provide history of present illness.  Apparently patient was in her usual state of health till last evening.  Earlier this morning when her home health aide returned, patient was found to be on the floor unable to get back up.  Patient was subsequently helped up and seemed to function okay till around 1 PM.  At the time it was noted that the patient again had a marked decline in functional status and basically remained bedbound and did not get up.  Per usual, patient does get up and move around and interact more.  Patient has been noted to be less interactive today has been referring to her son who had passed away a couple of years ago as well as to her sister who passed away even longer ago.  And she has also been agitated or upset with the granddaughter.  Which is new.  She has not been able to recognize her granddaughter which is new as well   ER course is notable for patient being diagnosed with urinary tract infection and a medical evaluation is sought.  1/29.  Care assumed by myself.  Patient remains encephalopathic.  Per granddaughter at bedside she is concerned about decline since admission.  At baseline patient is alert and oriented.  She is able to hold full conversations.  Patient is on culture appropriate antibiotics for treatment of urinary tract infection.  Vital signs remained stable however mental status remains poor.  Assessment & Plan:   Principal Problem:   AMS (altered mental status) Active Problems:   Primary hypertension   Urinary tract infection   Dementia without behavioral disturbance (HCC)   Lower abdominal pain   Acute metabolic encephalopathy  Acute metabolic encephalopathy Exact etiology is unclear.  Initially this was  attributed to urinary tract infection however despite 2 days of culture appropriate treatment patient has not improved in terms of mental status and daughter is concerned that her mental status is deteriorated.  She has had intermittent agitation/delirium since being admitted.  Has received some Haldol as well this Seroquel.  On 1/29 patient visibly appears comfortable however is not following any commands. Plan: Check MRI brain without contrast Given the patient is not safe to take p.o. medications will change UTI treatment to IV Rocephin Avoid sedatives, anticholinergics, narcotics Monitor mental status closely If mental status does not improve and there is concern on MRI will consider inpatient neurology evaluation.  Urinary tract infection Klebsiella isolated from urine.  Essentially pansensitive.  As patient is not tolerating p.o. medications at this time we will change back to IV Rocephin.  Generalized weakness with fall Physical therapy recommending skilled nursing facility placement.  Disposition plan on hold for now.  Dementia without behavioral disturbance Patient has good support structure at home.  Patient's granddaughter is healthcare power of attorney.  Hypertension Reasonably well-controlled at this time.  Will continue current medication regimen.   DVT prophylaxis: SQ Lovenox Code Status: DNR Family Communication: Granddaughter at bedside 1/29 Disposition Plan: Status is: Inpatient Remains inpatient appropriate because: Acute metabolic encephalopathy of unclear etiology   Level of care: Med-Surg  Consultants:  Palliative care  Procedures:  None  Antimicrobials: Ceftriaxone   Subjective: Seen and examined.  Visibly no distress however does not respond  verbally and does not follow commands  Objective: Vitals:   05/10/22 1625 05/10/22 2103 05/11/22 0351 05/11/22 0800  BP: (!) 159/60 (!) 161/74 (!) 126/103 (!) 119/48  Pulse: 71 86 64 61  Resp: 17  16 20    Temp: 98.7 F (37.1 C)  98.6 F (37 C) 98.4 F (36.9 C)  TempSrc:      SpO2: 99% 96% 98% 92%  Weight:      Height:        Intake/Output Summary (Last 24 hours) at 05/11/2022 1129 Last data filed at 05/11/2022 0500 Gross per 24 hour  Intake --  Output 200 ml  Net -200 ml   Filed Weights   05/08/22 1540 05/08/22 2038  Weight: 54.4 kg 54.8 kg    Examination:  General exam: NAD.  Appears stated age Respiratory system: Mild bibasilar crackles.  Normal work of breathing.  Room air Cardiovascular system: S1-S2, RRR, no murmurs, no pedal edema Gastrointestinal system: Soft, NT ND, normal bowel sounds Central nervous system: Lethargic.  Oriented x 0 Extremities: Unable to assess Skin: No rashes, lesions or ulcers Psychiatry: Unable to assess    Data Reviewed: I have personally reviewed following labs and imaging studies  CBC: Recent Labs  Lab 05/08/22 1545 05/09/22 0549  WBC 6.7 4.9  NEUTROABS 4.6  --   HGB 11.2* 11.4*  HCT 33.7* 34.1*  MCV 96.0 94.7  PLT 163 637*   Basic Metabolic Panel: Recent Labs  Lab 05/08/22 1545 05/09/22 0549  NA 139 137  K 3.6 3.7  CL 108 107  CO2 23 24  GLUCOSE 96 97  BUN 21 18  CREATININE 1.25* 1.12*  CALCIUM 9.4 8.9   GFR: Estimated Creatinine Clearance: 23.8 mL/min (A) (by C-G formula based on SCr of 1.12 mg/dL (H)). Liver Function Tests: Recent Labs  Lab 05/08/22 1545  AST 22  ALT 9  ALKPHOS 82  BILITOT 0.7  PROT 6.6  ALBUMIN 3.8   No results for input(s): "LIPASE", "AMYLASE" in the last 168 hours. No results for input(s): "AMMONIA" in the last 168 hours. Coagulation Profile: Recent Labs  Lab 05/08/22 2040  INR 1.0   Cardiac Enzymes: No results for input(s): "CKTOTAL", "CKMB", "CKMBINDEX", "TROPONINI" in the last 168 hours. BNP (last 3 results) No results for input(s): "PROBNP" in the last 8760 hours. HbA1C: No results for input(s): "HGBA1C" in the last 72 hours. CBG: Recent Labs  Lab 05/08/22 1551  05/09/22 1240  GLUCAP 104* 183*   Lipid Profile: No results for input(s): "CHOL", "HDL", "LDLCALC", "TRIG", "CHOLHDL", "LDLDIRECT" in the last 72 hours. Thyroid Function Tests: No results for input(s): "TSH", "T4TOTAL", "FREET4", "T3FREE", "THYROIDAB" in the last 72 hours. Anemia Panel: No results for input(s): "VITAMINB12", "FOLATE", "FERRITIN", "TIBC", "IRON", "RETICCTPCT" in the last 72 hours. Sepsis Labs: No results for input(s): "PROCALCITON", "LATICACIDVEN" in the last 168 hours.  Recent Results (from the past 240 hour(s))  Urine Culture (for pregnant, neutropenic or urologic patients or patients with an indwelling urinary catheter)     Status: Abnormal   Collection Time: 05/08/22  6:46 PM   Specimen: Urine, Random  Result Value Ref Range Status   Specimen Description   Final    URINE, RANDOM Performed at [facility], [address], [place], [place] [address]    Special Requests   Final    NONE Performed at [facility], [address], [place], [place] [address]    Culture >=100,000 COLONIES/mL KLEBSIELLA OXYTOCA (A)  Final  Report Status 05/11/2022 FINAL  Final   Organism ID, Bacteria KLEBSIELLA OXYTOCA (A)  Final      Susceptibility   Klebsiella oxytoca - MIC*    AMPICILLIN RESISTANT Resistant     CEFAZOLIN <=4 SENSITIVE Sensitive     CEFEPIME <=0.12 SENSITIVE Sensitive     CEFTRIAXONE <=0.25 SENSITIVE Sensitive     CIPROFLOXACIN <=0.25 SENSITIVE Sensitive     GENTAMICIN <=1 SENSITIVE Sensitive     IMIPENEM 0.5 SENSITIVE Sensitive     NITROFURANTOIN 64 INTERMEDIATE Intermediate     TRIMETH/SULFA <=20 SENSITIVE Sensitive     AMPICILLIN/SULBACTAM <=2 SENSITIVE Sensitive     PIP/TAZO <=4 SENSITIVE Sensitive     * >=100,000 COLONIES/mL KLEBSIELLA OXYTOCA         Radiology Studies: No results found.      Scheduled Meds:  enalapril  5 mg Oral Daily   enoxaparin (LOVENOX) injection  30 mg Subcutaneous Q24H   influenza vaccine  adjuvanted  0.5 mL Intramuscular Tomorrow-1000   metoprolol tartrate  12.5 mg Oral BID   sodium chloride flush  3 mL Intravenous Q12H   Continuous Infusions:  cefTRIAXone (ROCEPHIN)  IV       LOS: 2 days      Sidney Ace, MD [facility]   If 7PM-7AM, please contact night-coverage  05/11/2022, 11:29 AM

## 2022-05-11 NOTE — Progress Notes (Signed)
OT Cancellation Note  Patient Details Name: [name] MRN: [id number] DOB: 12-29-1924   Cancelled Treatment:    Reason Eval/Treat Not Completed: Fatigue/lethargy limiting ability to participate. OT orders received, chart reviewed. Granddaughter present in room. Pt very lethargic and was unable to be aroused even with sternal rub/constant multimodal cues. Minimal eye opening, unable to squeeze therapist's hand on command, unable to verbalize if in any pain. Spoke with RN who reported pt has not gotten any meds this am. Granddaughter concerned about change in pt's status, she feels that pt has significantly declined since admission. Granddaughter reports that pt is normally A&O although has mild dementia and able to feed/dress herself. Team updated and aware. Will re-attempt as able and as pt medically appropriate.    Doneta Public 05/11/2022, 9:53 AM

## 2022-05-11 NOTE — Progress Notes (Signed)
MRI attempted to get patient @2 :20pm and she refused to come. Pt was eating. Will try back later today

## 2022-05-12 ENCOUNTER — Inpatient Hospital Stay (HOSPITAL_COMMUNITY)
Admit: 2022-05-12 | Discharge: 2022-05-12 | Disposition: A | Discharge status: Home / Self Care | Payer: PPO | Attending: Neurology | Admitting: Neurology

## 2022-05-12 ENCOUNTER — Encounter: Payer: Self-pay | Admitting: Internal Medicine

## 2022-05-12 ENCOUNTER — Inpatient Hospital Stay: Payer: PPO

## 2022-05-12 DIAGNOSIS — I639 Cerebral infarction, unspecified: Secondary | ICD-10-CM

## 2022-05-12 DIAGNOSIS — R410 Disorientation, unspecified: Secondary | ICD-10-CM

## 2022-05-12 DIAGNOSIS — Z7189 Other specified counseling: Secondary | ICD-10-CM

## 2022-05-12 DIAGNOSIS — I6389 Other cerebral infarction: Secondary | ICD-10-CM

## 2022-05-12 DIAGNOSIS — F0390 Unspecified dementia without behavioral disturbance: Secondary | ICD-10-CM | POA: Diagnosis not present

## 2022-05-12 DIAGNOSIS — N3000 Acute cystitis without hematuria: Secondary | ICD-10-CM | POA: Diagnosis not present

## 2022-05-12 DIAGNOSIS — Z515 Encounter for palliative care: Secondary | ICD-10-CM | POA: Diagnosis not present

## 2022-05-12 LAB — LIPID PANEL
Cholesterol: 152 mg/dL (ref 0–200)
HDL: 37 mg/dL — ABNORMAL LOW (ref >40)
LDL Cholesterol: 95 mg/dL (ref 0–99)
Total CHOL/HDL Ratio: 4.1 RATIO
Triglycerides: 101 mg/dL (ref <150)
VLDL: 20 mg/dL (ref 0–40)

## 2022-05-12 LAB — CBC WITH DIFFERENTIAL/PLATELET
Abs Immature Granulocytes: 0.06 10*3/uL (ref 0.00–0.07)
Basophils Absolute: 0 10*3/uL (ref 0.0–0.1)
Basophils Relative: 0 %
Eosinophils Absolute: 0 10*3/uL (ref 0.0–0.5)
Eosinophils Relative: 0 %
HCT: 33.2 % — ABNORMAL LOW (ref 36.0–46.0)
Hemoglobin: 11.1 g/dL — ABNORMAL LOW (ref 12.0–15.0)
Immature Granulocytes: 1 %
Lymphocytes Relative: 12 %
Lymphs Abs: 1.2 10*3/uL (ref 0.7–4.0)
MCH: 31.8 pg (ref 26.0–34.0)
MCHC: 33.4 g/dL (ref 30.0–36.0)
MCV: 95.1 fL (ref 80.0–100.0)
Monocytes Absolute: 1.3 10*3/uL — ABNORMAL HIGH (ref 0.1–1.0)
Monocytes Relative: 13 %
Neutro Abs: 7.4 10*3/uL (ref 1.7–7.7)
Neutrophils Relative %: 74 %
Platelets: 153 10*3/uL (ref 150–400)
RBC: 3.49 MIL/uL — ABNORMAL LOW (ref 3.87–5.11)
RDW: 12.5 % (ref 11.5–15.5)
WBC: 10 10*3/uL (ref 4.0–10.5)
nRBC: 0 % (ref 0.0–0.2)

## 2022-05-12 LAB — BASIC METABOLIC PANEL
Anion gap: 7 (ref 5–15)
BUN: 33 mg/dL — ABNORMAL HIGH (ref 8–23)
CO2: 22 mmol/L (ref 22–32)
Calcium: 8.6 mg/dL — ABNORMAL LOW (ref 8.9–10.3)
Chloride: 101 mmol/L (ref 98–111)
Creatinine, Ser: 1.28 mg/dL — ABNORMAL HIGH (ref 0.44–1.00)
GFR, Estimated: 39 mL/min — ABNORMAL LOW (ref >60)
Glucose, Bld: 113 mg/dL — ABNORMAL HIGH (ref 70–99)
Potassium: 3.8 mmol/L (ref 3.5–5.1)
Sodium: 130 mmol/L — ABNORMAL LOW (ref 135–145)

## 2022-05-12 LAB — ECHOCARDIOGRAM COMPLETE
AR max vel: 2.41 cm2
AV Area VTI: 2.79 cm2
AV Area mean vel: 2.3 cm2
AV Mean grad: 3 mmHg
AV Peak grad: 6.3 mmHg
Ao pk vel: 1.25 m/s
Area-P 1/2: 2.67 cm2
Height: 62 in
MV VTI: 2.96 cm2
S' Lateral: 2.2 cm
Weight: 1934.4 oz

## 2022-05-12 LAB — HEMOGLOBIN A1C
Hgb A1c MFr Bld: 5.1 % (ref 4.8–5.6)
Mean Plasma Glucose: 99.67 mg/dL

## 2022-05-12 LAB — PROCALCITONIN: Procalcitonin: 0.68 ng/mL

## 2022-05-12 MED ORDER — CLOPIDOGREL BISULFATE 75 MG PO TABS
75.0000 mg | ORAL_TABLET | Freq: Every day | ORAL | Status: DC
  Administered 2022-05-12 – 2022-05-14 (×3): 75 mg via ORAL
  Filled 2022-05-12 (×3): qty 1

## 2022-05-12 MED ORDER — ROSUVASTATIN CALCIUM 10 MG PO TABS
10.0000 mg | ORAL_TABLET | Freq: Every day | ORAL | Status: DC
  Administered 2022-05-12 – 2022-05-14 (×3): 10 mg via ORAL
  Filled 2022-05-12 (×3): qty 1

## 2022-05-12 MED ORDER — ASPIRIN 325 MG PO TABS
325.0000 mg | ORAL_TABLET | Freq: Once | ORAL | Status: DC
  Filled 2022-05-12: qty 1

## 2022-05-12 MED ORDER — ASPIRIN 81 MG PO TBEC
81.0000 mg | DELAYED_RELEASE_TABLET | Freq: Every day | ORAL | Status: DC
  Administered 2022-05-12 – 2022-05-14 (×3): 81 mg via ORAL
  Filled 2022-05-12 (×3): qty 1

## 2022-05-12 NOTE — Progress Notes (Signed)
PROGRESS NOTE    [name]  CVE:[id number] DOB: 1924-05-22 DOA: 05/08/2022 PCP: Mikey Kirschner, PA-C    Brief Narrative:  CHERAL [name] is a 89 y.o. female with medical history significant of dementia.  Therefore patient is not able to provide history of present illness.  Apparently patient was in her usual state of health till last evening.  Earlier this morning when her home health aide returned, patient was found to be on the floor unable to get back up.  Patient was subsequently helped up and seemed to function okay till around 1 PM.  At the time it was noted that the patient again had a marked decline in functional status and basically remained bedbound and did not get up.  Per usual, patient does get up and move around and interact more.  Patient has been noted to be less interactive today has been referring to her son who had passed away a couple of years ago as well as to her sister who passed away even longer ago.  And she has also been agitated or upset with the granddaughter.  Which is new.  She has not been able to recognize her granddaughter which is new as well   ER course is notable for patient being diagnosed with urinary tract infection and a medical evaluation is sought.  1/29.  Care assumed by myself.  Patient remains encephalopathic.  Per granddaughter at bedside she is concerned about decline since admission.  At baseline patient is alert and oriented.  She is able to hold full conversations.  Patient is on culture appropriate antibiotics for treatment of urinary tract infection.  Vital signs remained stable however mental status remains poor.  1/30: MRI reviewed.  New left thalamic infarct.  Neurology consulted.  Plan for DAPT.  No indication for intervention.  Patient mental status remains poor but slightly improved from prior.  Assessment & Plan:   Principal Problem:   AMS (altered mental status) Active Problems:   Primary hypertension   Urinary tract infection    Dementia without behavioral disturbance (HCC)   Lower abdominal pain   Acute metabolic encephalopathy  Acute left thalamic infarct Noted on MRI imaging survey on 1/29.  Neurology consulted. Plan: DAPT with aspirin and Plavix x 3 weeks followed by aspirin monotherapy indefinitely Low-dose Crestor 10 mg daily Slowly control blood pressure over the next 2 to 3 days PT OT SLP Frequent neurochecks Telemetry Follow-up echocardiogram  Acute metabolic encephalopathy Now suspected secondary to acute CVA.  Unable to rule out contribution from urinary tract infection. Plan: Last dose of IV Rocephin today.  Then discontinue antibiotics Delirium precautions   Urinary tract infection Klebsiella isolated from urine.  Essentially pansensitive.  Last dose of IV Rocephin today  Generalized weakness with fall Current recommendation for skilled nursing facility placement  Dementia without behavioral disturbance Patient has good support structure at home.  Patient's granddaughter is healthcare power of attorney.  Palliative care consult called, appreciate recommendations  Hypertension Antihypertensives on hold for now.  Gradually restart in the next 1 to 2 days   DVT prophylaxis: SQ Lovenox Code Status: DNR Family Communication: Granddaughter at bedside 1/29, 1/30 Disposition Plan: Status is: Inpatient Remains inpatient appropriate because: Acute CVA   Level of care: Med-Surg  Consultants:  Palliative care Neurology  Procedures:  None  Antimicrobials: Ceftriaxone   Subjective: Seen and examined.  Seen up in chair.  A little more responsive than yesterday.  Mental status remains poor.  Objective: Vitals:  05/11/22 2127 05/12/22 0544 05/12/22 0818 05/12/22 1100  BP:  (!) 128/50 (!) 140/62 (!) 148/62  Pulse: 83 62 68   Resp: 18 16 16    Temp: 98.1 F (36.7 C) (!) 96.5 F (35.8 C) 97.9 F (36.6 C)   TempSrc:   Axillary   SpO2: 97% 95% 98%   Weight:      Height:         Intake/Output Summary (Last 24 hours) at 05/12/2022 1427 Last data filed at 05/11/2022 1645 Gross per 24 hour  Intake 360.21 ml  Output --  Net 360.21 ml   Filed Weights   05/08/22 1540 05/08/22 2038  Weight: 54.4 kg 54.8 kg    Examination:  General exam: Appears fatigued.  Appears stated age Respiratory system: Lungs clear.  Normal work of breathing.  Room air Cardiovascular system: S1-S2, RRR, no murmurs, no pedal edema Gastrointestinal system: Soft, NT ND, normal bowel sounds Central nervous system: Lethargic.  Oriented x 0 Extremities: Unable to assess Skin: No rashes, lesions or ulcers Psychiatry: Unable to assess    Data Reviewed: I have personally reviewed following labs and imaging studies  CBC: Recent Labs  Lab 05/08/22 1545 05/09/22 0549 05/11/22 1115 05/12/22 0451  WBC 6.7 4.9 14.1* 10.0  NEUTROABS 4.6  --  8.8* 7.4  HGB 11.2* 11.4* 12.9 11.1*  HCT 33.7* 34.1* 38.8 33.2*  MCV 96.0 94.7 95.6 95.1  PLT 163 145* 144* 017   Basic Metabolic Panel: Recent Labs  Lab 05/08/22 1545 05/09/22 0549 05/11/22 1115 05/12/22 0451  NA 139 137 131* 130*  K 3.6 3.7 4.0 3.8  CL 108 107 100 101  CO2 23 24 19* 22  GLUCOSE 96 97 87 113*  BUN 21 18 33* 33*  CREATININE 1.25* 1.12* 1.30* 1.28*  CALCIUM 9.4 8.9 8.8* 8.6*   GFR: Estimated Creatinine Clearance: 20.8 mL/min (A) (by C-G formula based on SCr of 1.28 mg/dL (H)). Liver Function Tests: Recent Labs  Lab 05/08/22 1545  AST 22  ALT 9  ALKPHOS 82  BILITOT 0.7  PROT 6.6  ALBUMIN 3.8   No results for input(s): "LIPASE", "AMYLASE" in the last 168 hours. No results for input(s): "AMMONIA" in the last 168 hours. Coagulation Profile: Recent Labs  Lab 05/08/22 2040  INR 1.0   Cardiac Enzymes: No results for input(s): "CKTOTAL", "CKMB", "CKMBINDEX", "TROPONINI" in the last 168 hours. BNP (last 3 results) No results for input(s): "PROBNP" in the last 8760 hours. HbA1C: Recent Labs    05/12/22 0451   HGBA1C 5.1   CBG: Recent Labs  Lab 05/08/22 1551 05/09/22 1240  GLUCAP 104* 183*   Lipid Profile: Recent Labs    05/12/22 0451  CHOL 152  HDL 37*  LDLCALC 95  TRIG 101  CHOLHDL 4.1   Thyroid Function Tests: No results for input(s): "TSH", "T4TOTAL", "FREET4", "T3FREE", "THYROIDAB" in the last 72 hours. Anemia Panel: No results for input(s): "VITAMINB12", "FOLATE", "FERRITIN", "TIBC", "IRON", "RETICCTPCT" in the last 72 hours. Sepsis Labs: Recent Labs  Lab 05/12/22 0451  PROCALCITON 0.68    Recent Results (from the past 240 hour(s))  Urine Culture (for pregnant, neutropenic or urologic patients or patients with an indwelling urinary catheter)     Status: Abnormal   Collection Time: 05/08/22  6:46 PM   Specimen: Urine, Random  Result Value Ref Range Status   Specimen Description   Final    URINE, RANDOM Performed at [facility], [address] [place], [place] [address]  Special Requests   Final    NONE Performed at [facility], [address] [place],  [address]    Culture >=100,000 COLONIES/mL KLEBSIELLA OXYTOCA (A)  Final   Report Status 05/11/2022 FINAL  Final   Organism ID, Bacteria KLEBSIELLA OXYTOCA (A)  Final      Susceptibility   Klebsiella oxytoca - MIC*    AMPICILLIN RESISTANT Resistant     CEFAZOLIN <=4 SENSITIVE Sensitive     CEFEPIME <=0.12 SENSITIVE Sensitive     CEFTRIAXONE <=0.25 SENSITIVE Sensitive     CIPROFLOXACIN <=0.25 SENSITIVE Sensitive     GENTAMICIN <=1 SENSITIVE Sensitive     IMIPENEM 0.5 SENSITIVE Sensitive     NITROFURANTOIN 64 INTERMEDIATE Intermediate     TRIMETH/SULFA <=20 SENSITIVE Sensitive     AMPICILLIN/SULBACTAM <=2 SENSITIVE Sensitive     PIP/TAZO <=4 SENSITIVE Sensitive     * >=100,000 COLONIES/mL KLEBSIELLA OXYTOCA         Radiology Studies: MR ANGIO HEAD WO CONTRAST  Result Date: 05/12/2022 CLINICAL DATA:  Stroke follow-up.  History of dementia EXAM: MRA HEAD WITHOUT CONTRAST  TECHNIQUE: Angiographic images of the Circle of Willis were acquired using MRA technique without intravenous contrast. COMPARISON:  Brain MRI from yesterday FINDINGS: Anterior circulation: No major branch occlusion, beading, or vascular malformation. Atheromatous changes with greatest involvement in the left ACA where there is A2 segment tandem narrowings. Mild left MCA stenosis. Overall, intracranial atheromatous changes mild for age. Posterior circulation: Vertebral and basilar arteries are diffusely patent. Fetal type bilateral PCA flow with duplicated right posterior communicating artery/PCA. At least moderate right P3 segment narrowing. Negative for aneurysm. Anatomic variants: As above IMPRESSION: No emergent finding. Unremarkable appearance of the proximal left PCA. Intracranial atherosclerosis most notably affecting the right PCA and left ACA. Electronically Signed   By: Jorje Guild M.D.   On: 05/12/2022 10:49   US Carotid Bilateral  Result Date: 05/12/2022 CLINICAL DATA:  Hypertension, stroke EXAM: BILATERAL CAROTID DUPLEX ULTRASOUND TECHNIQUE: Pearline Cables scale imaging, color Doppler and duplex ultrasound were performed of bilateral carotid and vertebral arteries in the neck. COMPARISON:  None Available. FINDINGS: Criteria: Quantification of carotid stenosis is based on velocity parameters that correlate the residual internal carotid diameter with NASCET-based stenosis levels, using the diameter of the distal internal carotid lumen as the denominator for stenosis measurement. The following velocity measurements were obtained: RIGHT ICA: 78/14 cm/sec CCA: 10/9 cm/sec SYSTOLIC ICA/CCA RATIO:  1.0 ECA: 46 cm/sec LEFT ICA: 82/15 cm/sec CCA: 32/35 cm/sec SYSTOLIC ICA/CCA RATIO:  1.5 ECA: 53 cm/sec RIGHT CAROTID ARTERY: Intimal thickening and bifurcation atherosclerotic plaque formation. No hemodynamically significant right ICA stenosis, velocity elevation, or turbulent flow. Degree of narrowing less than 50%.  RIGHT VERTEBRAL ARTERY:  Normal antegrade flow LEFT CAROTID ARTERY: Similar interval thickening and bifurcation atherosclerotic plaque formation. No hemodynamically significant left ICA stenosis, velocity elevation, or turbulent flow. LEFT VERTEBRAL ARTERY:  Normal antegrade flow IMPRESSION: Bilateral carotid atherosclerosis. Negative for stenosis. Degree of narrowing less than 50% bilaterally by ultrasound criteria. Patent antegrade vertebral flow bilaterally Electronically Signed   By: Jerilynn Mages.  Shick M.D.   On: 05/12/2022 09:43   MR BRAIN WO CONTRAST  Result Date: 05/11/2022 CLINICAL DATA:  Altered mental status EXAM: MRI HEAD WITHOUT CONTRAST TECHNIQUE: Multiplanar, multiecho pulse sequences of the brain and surrounding structures were obtained without intravenous contrast. COMPARISON:  11/21/2020 FINDINGS: Brain: Acute infarct of the ventral left thalamus. No acute or chronic hemorrhage. There is multifocal hyperintense T2-weighted signal within the white  matter. Generalized volume loss. The midline structures are normal. Vascular: Major flow voids are preserved. Skull and upper cervical spine: Normal calvarium and skull base. Visualized upper cervical spine and soft tissues are normal. Sinuses/Orbits:No paranasal sinus fluid levels or advanced mucosal thickening. Small amount of bilateral mastoid fluid. Normal orbits. IMPRESSION: Acute left thalamic infarct.  No hemorrhage or mass effect. Electronically Signed   By: Ulyses Jarred M.D.   On: 05/11/2022 23:56        Scheduled Meds:  aspirin EC  81 mg Oral Daily   clopidogrel  75 mg Oral Daily   enoxaparin (LOVENOX) injection  30 mg Subcutaneous Q24H   rosuvastatin  10 mg Oral Daily   sodium chloride flush  3 mL Intravenous Q12H   Continuous Infusions:  lactated ringers 75 mL/hr at 05/11/22 1645     LOS: 3 days      Sidney Ace, MD [facility]   If 7PM-7AM, please contact night-coverage  05/12/2022, 2:27 PM

## 2022-05-12 NOTE — Progress Notes (Signed)
Physical Therapy Treatment Patient Details Name: [name] MRN: [id number] DOB: May 19, 1924 Today's Date: 05/12/2022   History of Present Illness presented to ER secondary to fall, AMS; admitted for management of UTI.  Hospital course complicated by period of decreased alertness, decreased functional status; noted with acute CVA (L thalamic infarct)    PT Comments    Patient alert and responsive upon arrival to room; oriented to self only.  Follows very simple verbal commands but with noted delay in processing, initiation and overall responsiveness.  Noted difficulty with verbal expression, articulating only 1-2 word answers to simple questions (generally inconsistently). Generalized R-sided weakness apparent (3-/5, UE > LE; + drift) with mild inattention and moderate motor planning/coordination deficits. Currently requiring mod/max assist for bed mobility; mod assist +2 for sit/stand, bed/chair transfer with RW.  Requires hand-over-hand for UE position on RW (R > L), assist for lift off, stabilization.  Mod/max cuing for walker management; requiring constant manual cuing for weight shift to unweight/advance LEs (though accepts weight through extremities without buckling) Goals and POC established at initial evaluation remain appropriate for patient; however, level of care and therapy needs at discharge updated to reflect recs for STR at discharge.  TOC/MD informed/aware.   Recommendations for follow up therapy are one component of a multi-disciplinary discharge planning process, led by the attending physician.  Recommendations may be updated based on patient status, additional functional criteria and insurance authorization.  Follow Up Recommendations  Skilled nursing-short term rehab (<3 hours/day)     Assistance Recommended at Discharge Frequent or constant Supervision/Assistance  Patient can return home with the following Assistance with cooking/housework;Assistance with feeding;Direct  supervision/assist for medications management;Direct supervision/assist for financial management;Help with stairs or ramp for entrance;Assist for transportation;A lot of help with walking and/or transfers;A lot of help with bathing/dressing/bathroom   Equipment Recommendations       Recommendations for Other Services       Precautions / Restrictions Precautions Precautions: Fall Restrictions Weight Bearing Restrictions: No     Mobility  Bed Mobility Overal bed mobility: Needs Assistance Bed Mobility: Supine to Sit     Supine to sit: Mod assist, Max assist     General bed mobility comments: marked difficulty in motor planning and task sequencing; limited ability to actively initiate and participate with transitional movement    Transfers Overall transfer level: Needs assistance Equipment used: Rolling walker (2 wheels) Transfers: Sit to/from Stand, Bed to chair/wheelchair/BSC Sit to Stand: Mod assist, +2 safety/equipment Stand pivot transfers: Mod assist, +2 safety/equipment         General transfer comment: hand-over-hand for UE position on RW (R > L), assist for lift off, stabilization.  Mod/max cuing for walker management; requiring constant manual cuing for weight shift to unweight/advance LEs (though accepts weight through extremities without buckling)    Ambulation/Gait               General Gait Details: deferred this date   Stairs             Wheelchair Mobility    Modified Rankin (Stroke Patients Only)       Balance Overall balance assessment: Needs assistance Sitting-balance support: Feet supported, No upper extremity supported Sitting balance-Leahy Scale: Fair     Standing balance support: Bilateral upper extremity supported Standing balance-Leahy Scale: Poor Standing balance comment: +1 hands on assist at all times  Cognition Arousal/Alertness: Awake/alert Behavior During Therapy: Flat  affect Overall Cognitive Status: No family/caregiver present to determine baseline cognitive functioning                                 General Comments: much less interactive than initial evaluation; noted delay in initiation and processing (of speech, movement).  General L-sided gaze preference, but does scan R with max cuing.  Verbalized answers in 1-2 word answers intermittently, but noted difficulty with expressive speech        Exercises      General Comments        Pertinent Vitals/Pain Pain Assessment Pain Assessment: Faces Faces Pain Scale: No hurt    Home Living                          Prior Function            PT Goals (current goals can now be found in the care plan section) Acute Rehab PT Goals Patient Stated Goal: "i need to pee" PT Goal Formulation: With patient Time For Goal Achievement: 05/23/22 Potential to Achieve Goals: Fair Progress towards PT goals: Progressing toward goals    Frequency    7X/week      PT Plan Current plan remains appropriate;Frequency needs to be updated    Co-evaluation              AM-PAC PT "6 Clicks" Mobility   Outcome Measure  Help needed turning from your back to your side while in a flat bed without using bedrails?: A Lot Help needed moving from lying on your back to sitting on the side of a flat bed without using bedrails?: A Lot Help needed moving to and from a bed to a chair (including a wheelchair)?: A Lot Help needed standing up from a chair using your arms (e.g., wheelchair or bedside chair)?: A Lot Help needed to walk in hospital room?: A Lot Help needed climbing 3-5 steps with a railing? : Total 6 Click Score: 11    End of Session Equipment Utilized During Treatment: Gait belt Activity Tolerance: Patient tolerated treatment well Patient left: in chair;with call bell/phone within reach;with chair alarm set;with nursing/sitter in room Nurse Communication: Mobility  status PT Visit Diagnosis: Muscle weakness (generalized) (M62.81);Difficulty in walking, not elsewhere classified (R26.2)     Time: 6269-4854 PT Time Calculation (min) (ACUTE ONLY): 23 min  Charges:  $Therapeutic Activity: 8-22 mins $Neuromuscular Re-education: 8-22 mins                     Djibril Glogowski H. Owens Shark, PT, DPT, NCS 05/12/22, 1:52 PM [phone]

## 2022-05-12 NOTE — Care Management Important Message (Signed)
Important Message  Patient Details  Name: [name] MRN: [id number] Date of Birth: November 17, 1924   Medicare Important Message Given:  N/A - LOS <3 / Initial given by admissions     [name] 05/12/2022, 8:24 AM

## 2022-05-12 NOTE — Consult Note (Signed)
Consultation Note Date: 05/12/2022   Patient Name: [name]  DOB: Jan 01, 1925  MRN: [id number]  Age / Sex: 89 y.o., female  PCP: Mikey Kirschner, PA-C Referring Physician: Sidney Ace, MD  Reason for Consultation: Establishing goals of care  HPI/Patient Profile: 89 y.o. female  with past medical history of dementia, but living alone with an aide 10 822P daily, nephew checks twice daily,diverticulitis, HTN, gallstones, breast cyst, history of UTI admitted on 05/08/2022 with unwitnessed fall, possible UTI, pelvic stroke.   Clinical Assessment and Goals of Care: I have reviewed medical records including EPIC notes, labs and imaging, received report from RN, assessed the patient.  [name] is resting quietly in bed.  She appears acutely/chronically ill and somewhat frail.  She is alert, making but not keeping eye contact.  She has expressive aphasia but will at times be able to correctly speak.  I do not believe that she can make her basic needs known at this time.  There is no family at bedside at this time.  I offer [name].  She is able to drown the straw but immediately coughs and tears.  Discussed with speech therapy.  He  Call to granddaughter, Myra/"Robin" L to discuss diagnosis prognosis, GOC, EOL wishes, disposition and options.  I introduced Palliative Medicine as specialized medical care for people living with serious illness. It focuses on providing relief from the symptoms and stress of a serious illness. The goal is to improve quality of life for both the patient and the family.  We discussed a brief life review of the patient.  [name] worked in an [employer] which caused severe arthritis in her hands.  Her spouse died in the 88s.  She had 1 son who died in 01-Jul-2020.  She has been living independently, only minor assisting with bathing.  She is able to walk  with a walker, doing her own laundry.  She has an aide a few hours a day for social support and safety.  Her granddaughter [name] manages her finances.  We then focused on their current illness.  We talk about her acute illness including, but not limited to, MRI results, neurology consult and recommendations, PT evaluations and recommendations, speech therapy consult pending.  At this point [name] states that she has seen an incredible improvement in her grandmother over 24 hours.  They are requesting short-term rehab.  I encouraged [name] to work very closely with Education officer, museum at rehab.    We talk about expectations for recovery after stroke.  We talk about aspiration and speech therapy consult for swallowing and communication.  We were discussing thickened liquids and difficulty in staying hydrated and this NP was to broach the subject of artificial feeding via PEG to, which would not be recommended, when [name] states that she needs to disconnect as a physician is present.  The natural disease trajectory and expectations at EOL were discussed.  I shared that it remains to be seen [name] ability to recover.  I also share that [name] could worsen/aspirate over the next few days, time for outcomes.  Advanced directives, concepts specific to code status, artifical feeding and hydration, and rehospitalization were considered and discussed.  "Treat the treatable, but allowing natural passing"/DNR verified.  Was attempting to discuss artificial feeding and hydration when [name] disconnects to speak with physician in the room.  Hospice and Palliative Care services outpatient were explained and offered.  We talk about the benefits of outpatient palliative services for further goals of care discussion.  [name] asks appropriate questions about hospice care.  I encouraged her to work with Education officer, museum at rehab for needed services.  Discussed the importance of continued conversation with family and the medical  providers regarding overall plan of care and treatment options, ensuring decisions are within the context of the patient's values and GOCs.  Questions and concerns were addressed.   The family was encouraged to call with questions or concerns.  PMT will continue to support holistically.  Conference with attending, bedside nursing staff, transition of care team related to patient condition, needs, goals of care, disposition.   HCPOA HCPOA -granddaughter/HCPOA, [name].  HCPOA paperwork noted in ACP tab of chart.    SUMMARY OF RECOMMENDATIONS   At this point continue to treat the treatable but no CPR or intubation Time for outcomes Short-term rehab with the ultimate goal of returning home Considering outpatient palliative services   Code Status/Advance Care Planning: DNR  Symptom Management:  Per hospitalist, no additional needs at this time.  Palliative Prophylaxis:  Aspiration, Frequent Pain Assessment, Oral Care, and Turn Reposition  Additional Recommendations (Limitations, Scope, Preferences): Continue to treat the treatable but no CPR or intubation.  Psycho-social/Spiritual:  Desire for further Chaplaincy support:no Additional Recommendations: Caregiving  Support/Resources and Education on Hospice  Prognosis:  Unable to determine, based on outcomes.  Guarded at this point.  Discharge Planning: Requesting short-term rehab, considering outpatient palliative      Primary Diagnoses: Present on Admission:  Urinary tract infection  Primary hypertension   I have reviewed the medical record, interviewed the patient and family, and examined the patient. The following aspects are pertinent.  Past Medical History:  Diagnosis Date   Acute UTI 08/21/2015   Bad memory 10/16/2014   Below normal amount of sodium in the blood 10/16/2014   Breast cyst    removed   Diverticulitis    Fall 07/29/2015   Foot pain 12/31/2014   Gallstones    Hypertension    Hyponatremia  01/11/2017   Low sodium levels    Near syncope 07/21/2015   Scalp laceration 07/29/2015   Social History   Socioeconomic History   Marital status: Widowed    Spouse name: Not on file   Number of children: 1   Years of education: Not on file   Highest education level: 12th grade  Occupational History   Occupation: retired  Tobacco Use   Smoking status: Never   Smokeless tobacco: Never  Vaping Use   Vaping Use: Never used  Substance and Sexual Activity   Alcohol use: No   Drug use: No   Sexual activity: Not Currently  Other Topics Concern   Not on file  Social History Narrative   Not on file   Social Determinants of Health   Financial Resource Strain: Low Risk  (05/06/2021)   Overall Financial Resource Strain (CARDIA)    Difficulty of Paying Living Expenses: Not hard at all  Food Insecurity: No Food Insecurity (05/08/2022)  Hunger Vital Sign    Worried About Running Out of Food in the Last Year: Never true    Ran Out of Food in the Last Year: Never true  Transportation Needs: No Transportation Needs (05/08/2022)   PRAPARE - Hydrologist (Medical): No    Lack of Transportation (Non-Medical): No  Physical Activity: Inactive (05/06/2021)   Exercise Vital Sign    Days of Exercise per Week: 0 days    Minutes of Exercise per Session: 0 min  Stress: No Stress Concern Present (05/06/2021)   [place]    Feeling of Stress : Not at all  Social Connections: Socially Isolated (05/06/2021)   Social Connection and Isolation Panel [NHANES]    Frequency of Communication with Friends and Family: More than three times a week    Frequency of Social Gatherings with Friends and Family: More than three times a week    Attends Religious Services: Never    Marine scientist or Organizations: No    Attends Archivist Meetings: Never    Marital Status: Widowed   Family History  Problem  Relation Age of Onset   Hypertension Father    CAD Father    Cancer Brother        unknown type   Scheduled Meds:  aspirin EC  81 mg Oral Daily   clopidogrel  75 mg Oral Daily   enoxaparin (LOVENOX) injection  30 mg Subcutaneous Q24H   sodium chloride flush  3 mL Intravenous Q12H   Continuous Infusions:  lactated ringers 75 mL/hr at 05/11/22 1645   PRN Meds:.acetaminophen Medications Prior to Admission:  Prior to Admission medications   Medication Sig Start Date End Date Taking? Authorizing Provider  acetaminophen (TYLENOL) 500 MG tablet Take 500 mg by mouth every 6 (six) hours as needed for mild pain or moderate pain.   Yes [provider]  enalapril (VASOTEC) 10 MG tablet TAKE 1 TABLET (10 MG TOTAL) BY MOUTH DAILY. PLEASE SCHEDULE AN OFFICE VISIT BEFORE ANYMORE REFILLS. 04/07/22  Yes Gwyneth Sprout, FNP  metoprolol tartrate (LOPRESSOR) 25 MG tablet Take 0.5 tablets (12.5 mg total) by mouth 2 (two) times daily. 04/07/22  Yes Gwyneth Sprout, FNP  acetaminophen (TYLENOL) 325 MG tablet Take 2 tablets (650 mg total) by mouth every 6 (six) hours as needed for mild pain (or Fever >/= 101). Patient not taking: Reported on 05/11/2022 07/23/15   Nicholes Mango, MD  torsemide [facility]) 20 MG tablet Take in AM every other day Patient not taking: Reported on 05/11/2022 01/16/22   Mikey Kirschner, PA-C   Allergies  Allergen Reactions   Bactrim [Sulfamethoxazole-Trimethoprim] Nausea And Vomiting   Metronidazole Other (See Comments) and Hives    Reaction: unknown Reaction: unknown   Nitrofurantoin Hives    GI upset   Nitrofurantoin Monohyd Macro Other (See Comments)    GI upset   Penicillins Diarrhea, Other (See Comments) and Hives    Has patient had a PCN reaction causing immediate rash, facial/tongue/throat swelling, SOB or lightheadedness with hypotension: Unknown Has patient had a PCN reaction causing severe rash involving mucus membranes or skin necrosis: Unknown Has patient had a  PCN reaction that required hospitalization: Unknown Has patient had a PCN reaction occurring within the last 10 years: Unknown If all of the above answers are "NO", then may proceed with Cephalosporin use. Has patient had a PCN reaction causing immediate rash, facial/tongue/throat swelling, SOB or  lightheadedness with hypotension: Unknown Has patient had a PCN reaction causing severe rash involving mucus membranes or skin necrosis: Unknown Has patient had a PCN reaction that required hospitalization: Unknown Has patient had a PCN reaction occurring within the last 10 years: Unknown If all of the above answers are "NO", then may proceed with Cephalosporin use.   Zafirlukast Other (See Comments) and Hives    Reaction: unknown Reaction: unknown   Keflex [Cephalexin] Rash   Review of Systems  Unable to perform ROS: Age    Physical Exam Vitals and nursing note reviewed.  Constitutional:      General: She is not in acute distress.    Appearance: She is ill-appearing.  Pulmonary:     Effort: Pulmonary effort is normal. No respiratory distress.  Neurological:     Mental Status: She is alert.     Comments: Expressive aphasia  Psychiatric:     Comments: Calm, not fearful     Vital Signs: BP (!) 148/62   Pulse 68   Temp 97.9 F (36.6 C) (Axillary)   Resp 16   Ht 5' 2"$  (1.575 m)   Wt 54.8 kg   SpO2 98%   BMI 22.11 kg/m  Pain Scale: Faces   Pain Score: 5    SpO2: SpO2: 98 % O2 Device:SpO2: 98 % O2 Flow Rate: .   IO: Intake/output summary:  Intake/Output Summary (Last 24 hours) at 05/12/2022 1224 Last data filed at 05/11/2022 1645 Gross per 24 hour  Intake 360.21 ml  Output --  Net 360.21 ml    LBM: Last BM Date : 05/12/22 Baseline Weight: Weight: 54.4 kg Most recent weight: Weight: 54.8 kg     Palliative Assessment/Data:     Time In: 0840 Time Out: 0955 Time Total: 75 minutes  Greater than 50%  of this time was spent counseling and coordinating care related to  the above assessment and plan.  Signed by: Drue Novel, NP   Please contact Palliative Medicine Team phone at [phone] for questions and concerns.  For individual provider: See Shea Evans

## 2022-05-12 NOTE — Evaluation (Signed)
Clinical/Bedside Swallow Evaluation Patient Details  Name: [name] MRN: [id number] Date of Birth: July 19, 1924  Today's Date: 05/12/2022 Time: SLP Start Time (ACUTE ONLY): 1310 SLP Stop Time (ACUTE ONLY): 1410 SLP Time Calculation (min) (ACUTE ONLY): 60 min  Past Medical History:  Past Medical History:  Diagnosis Date   Acute UTI 08/21/2015   Bad memory 10/16/2014   Below normal amount of sodium in the blood 10/16/2014   Breast cyst    removed   Diverticulitis    Fall 07/29/2015   Foot pain 12/31/2014   Gallstones    Hypertension    Hyponatremia 01/11/2017   Low sodium levels    Near syncope 07/21/2015   Scalp laceration 07/29/2015   Past Surgical History:  Past Surgical History:  Procedure Laterality Date   ABDOMINAL HYSTERECTOMY  2000   APPENDECTOMY  1940's   bladder tack  1990's   BREAST CYST INCISION AND DRAINAGE Left 1990's   CHOLECYSTECTOMY  09/11/13   Cholangiogram suggested a retained stone ductal dilatation.   ERCP W/ SPHICTEROTOMY  09/12/2013   Recurrent stone identified post cholecystectomy.   NASAL SINUS SURGERY     HPI:  Pt is a 89 y.o. Caucasian female with PMH of dementia, HTN and UTI admitted for AMS, found down at home. Per chart, pt was found by health care aide on the floor not able to get up on 1/26. After that pt seems to have acute functional decline, confused, with intermittent agitation, more encephalopathic. Given her UTI hx and she was admitted for UTI encephalopathy and started on Rocephin treatment. 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 Neurology was called for management. Per NSG and family report, pt has been "out of it" and "not eating/drinking much" since admit but now having overt coughing w/ thin liquids.    Assessment / Plan / Recommendation  Clinical Impression   Pt seen for BSE today. Pt awake, sitting in chair w/ Granddaughter present feeding her the Lunch meal. Pt was verbal  when asked direct questions w/ minimal, basic responses -- improvement since yesterday per report. Pt has a Baseline of Dementia.  Since improvement in awake/alert status and engaging in more oral intake, NSG and family have noted increased Coughing w/ thin liquids. Pt required MOD verbal/tactile cues for follow through w/ tasks -- hand supporting Cup holding during drinking.  On RA; afebrile.   Pt appears to present w/ pharyngeal phase, and potential pharyngoesophageal phase, dysphagia w/ pharyngeal phase deficits noted and suspected sensorineural deficits. Also suspect impact from declined Cognitive status(baseline Dementia) impacting her overall awareness/engagement during po tasks -- ANY Cognitive decline can impact oropharyngeal phase swallowing which increases risk for aspiration. Pt required Min-Mod tactile/verbal/visual cues for orientation to bolus presentation, follow through w/ tasks, and self-feeding support. She was often quiet and less engaged b/t trials if not given continuous stimulation -- suspect a degree of fatigue.   Pt consumed trials of single ice chips, thin and Nectar liquids via Cup then Nectar liquids via TSP, purees, and minced/soft solids mostly fed to her w/ hand over hand support to hold Cup, then feeding by utensil d/t overall weakness. Immediate, overt clinical s/s of aspiration noted w/ trials of thin liquids(coughing) as was noted by NSG today/this morning. W/ trials of Nectar liquids via Cup, delayed(3, 2 post hiccups prior) and immediate coughing occurred x1, BUT none noted when consuming Nectar liquids via TSP nor w/ foods --  no cough and no decline in respiratory presentation noted during/post these trials. Suspect decreased attention to task as well as potential delayed pharyngeal swallowing initiation(d/t decreased awareness) could have impacted safety and success of pharyngeal swallow w/ thin liquids. OF NOTE: when pt exhibited 2-3 hiccups post ~4 boluses, she followed  this w/ a mild, congested cough -- unsure if this could be related to min retrograde flow from the Esophagus(?). Min more Time given BETWEEN trials to allow for Esophageal clearing.  Oral phase was c/b min slower bolus management and oral clearing of all boluses given, but overall functional. She required increased Time for mastication and A-P transfer w/ increased textured, solid foods -- unsure if related to overall weakness, fatigue, or decreased attention/awareness. Time b/t trials required for pt to fully clear solid foods.  OM exam was cursory d/t pt eating her meal at the time, but no unilateral lingual asymmetry/weakness was noted, no gross labial weakness/asymmetry noted. Pt's vocal quality was min low but speech was intelligible.   Recommend dysphagia level 3 diet(minced meats) w/ Nectar consistency liquids via TSP; aspiration precautions; Pills Crushed vs Whole in puree for safety; feeding support and Supervision at meals, reduce Distractions during meals and give time for oral clearing during/post intake. Pt may benefit from objective assessment of swallowing next 2-3 days as she continues to improve. Recommend f/u w/ Palliative Care for ongoing [place] discussion; Dietician f/u for support of nutrition. NSG/MD updated. SLP Visit Diagnosis: Dysphagia, pharyngeal phase (R13.13);Dysphagia, pharyngoesophageal phase (R13.14) (baseline Dementia; advanced age)    Aspiration Risk  Moderate aspiration risk;Risk for inadequate nutrition/hydration    Diet Recommendation   dysphagia level 3 diet(minced meats) w/ Nectar consistency liquids via TSP; aspiration precautions; tray setup and sitting up; feeding support and Supervision at meals, reduce Distractions during meals and give time for oral clearing during/post intake.  Medication Administration: Whole meds with puree (vs Crushed in Puree)    Other  Recommendations Recommended Consults:  (Palliative Care following; Dietician) Oral Care  Recommendations: Oral care QID;Oral care before and after PO;Staff/trained caregiver to provide oral care Other Recommendations: Order thickener from pharmacy;Prohibited food (jello, ice cream, thin soups);Remove water pitcher;Have oral suction available    Recommendations for follow up therapy are one component of a multi-disciplinary discharge planning process, led by the attending physician.  Recommendations may be updated based on patient status, additional functional criteria and insurance authorization.  Follow up Recommendations Skilled nursing-short term rehab (<3 hours/day)      Assistance Recommended at Discharge  full  Functional Status Assessment Patient has had a recent decline in their functional status and/or demonstrates limited ability to make significant improvements in function in a reasonable and predictable amount of time  Frequency and Duration min 2x/week  2 weeks       Prognosis Prognosis for Safe Diet Advancement: Fair Barriers to Reach Goals: Cognitive deficits;Language deficits;Time post onset;Severity of deficits Barriers/Prognosis Comment: advanced age      2 Study   General Date of Onset: 05/08/22 HPI: Pt is a 89 y.o. Caucasian female with PMH of dementia, HTN and UTI admitted for AMS, found down at home. Per chart, pt was found by health care aide on the floor not able to get up on 1/26. After that pt seems to have acute functional decline, confused, with intermittent agitation, more encephalopathic. Given her UTI hx and she was admitted for UTI encephalopathy and started on Rocephin treatment. However, pt symptoms did not respond well to Abx, so MRI was  done and found to have left thalamic infarct. Neurology was called for management. Per NSG and family report, pt has been "out of it" and "not eating/drinking much" since admit but now having overt coughing w/ thin liquids. Type of Study: Bedside Swallow Evaluation Previous Swallow Assessment: none Diet  Prior to this Study: Regular;Thin liquids Temperature Spikes Noted: No (wbc 10.0) Respiratory Status: Room air History of Recent Intubation: No Behavior/Cognition: Alert;Cooperative;Pleasant mood;Confused;Distractible;Requires cueing (paucity of speech) Oral Cavity Assessment: Within Functional Limits Oral Care Completed by SLP: Recent completion by staff Oral Cavity - Dentition: Adequate natural dentition Vision: Functional for self-feeding Self-Feeding Abilities: Able to feed self;Needs assist;Needs set up;Total assist (by holding own Cup when drinking; needs support w foods) Patient Positioning: Upright in chair (head forward) Baseline Vocal Quality: Low vocal intensity (intelligible) Volitional Cough:  (Fair-Good) Volitional Swallow: Able to elicit    Oral/Motor/Sensory Function Overall Oral Motor/Sensory Function: Within functional limits (no gross OM weakness; lingual ROM and symmetry WFL.)   Ice Chips Ice chips: Within functional limits Presentation: Spoon (fed 2 trials)   Thin Liquid Thin Liquid: Impaired Presentation: Cup;Self Fed (5-6 trials) Oral Phase Impairments: Poor awareness of bolus Pharyngeal  Phase Impairments: Suspected delayed Swallow;Cough - Immediate (x3 trials) Other Comments: noted hiccups    Nectar Thick Nectar Thick Liquid: Impaired Presentation: Cup;Self Fed;Spoon (fed via TSP by SLP- WFL(6 trials); via Cup(~3-4 ozs)) Oral Phase Impairments: Poor awareness of bolus Pharyngeal Phase Impairments: Suspected delayed Swallow;Cough - Immediate;Cough - Delayed (hiccups then cough occurred also) Other Comments: unsure if an Esophageal component?   Honey Thick Honey Thick Liquid: Not tested   Puree Puree: Within functional limits Presentation: Spoon (fed; ~4 ozs total)   Solid     Solid: Within functional limits (grossly) Presentation: Spoon (fed; 5-6 trials)         Orinda Kenner, MS, CCC-SLP Speech Language Pathologist Rehab Services; [place] [phone] (ascom) Odyssey Vasbinder 05/12/2022,5:55 PM

## 2022-05-12 NOTE — Consult Note (Addendum)
Stroke Neurology Consultation Note  Consult Requested by: Dr. Priscella Mann  Reason for Consult: stroke  Consult Date: 05/12/22   The history was obtained from the chart and Dr. Priscella Mann.  During history and examination, all items were unable to obtain unless otherwise noted.  History of Present Illness:  [name] is a 89 y.o. Caucasian female with PMH of dementia, HTN and UTI admitted for AMS, found down at home. Per chart, pt was found by health care aide on the floor not able to get up on 1/26. After that pt seems to have acute functional decline, confused, with intermittent agitation, more encephalopathic. Given her UTI hx and she was admitted for UTI encephalopathy and started on Rocephin treatment. 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 Neurology was called for management.   LSN: 1/25 night tPA Given: No: outside window  Past Medical History:  Diagnosis Date   Acute UTI 08/21/2015   Bad memory 10/16/2014   Below normal amount of sodium in the blood 10/16/2014   Breast cyst    removed   Diverticulitis    Fall 07/29/2015   Foot pain 12/31/2014   Gallstones    Hypertension    Hyponatremia 01/11/2017   Low sodium levels    Near syncope 07/21/2015   Scalp laceration 07/29/2015    Past Surgical History:  Procedure Laterality Date   ABDOMINAL HYSTERECTOMY  2000   APPENDECTOMY  1940's   bladder tack  1990's   BREAST CYST INCISION AND DRAINAGE Left 1990's   CHOLECYSTECTOMY  09/11/13   Cholangiogram suggested a retained stone ductal dilatation.   ERCP W/ SPHICTEROTOMY  09/12/2013   Recurrent stone identified post cholecystectomy.   NASAL SINUS SURGERY      Family History  Problem Relation Age of Onset   Hypertension Father    CAD Father    Cancer Brother        unknown type    Social History:  reports that she has never smoked. She has never used smokeless tobacco. She reports that she does not drink alcohol and  does not use drugs.  Allergies:  Allergies  Allergen Reactions   Bactrim [Sulfamethoxazole-Trimethoprim] Nausea And Vomiting   Metronidazole Other (See Comments) and Hives    Reaction: unknown Reaction: unknown   Nitrofurantoin Hives    GI upset   Nitrofurantoin Monohyd Macro Other (See Comments)    GI upset   Penicillins Diarrhea, Other (See Comments) and Hives    Has patient had a PCN reaction causing immediate rash, facial/tongue/throat swelling, SOB or lightheadedness with hypotension: Unknown Has patient had a PCN reaction causing severe rash involving mucus membranes or skin necrosis: Unknown Has patient had a PCN reaction that required hospitalization: Unknown Has patient had a PCN reaction occurring within the last 10 years: Unknown If all of the above answers are "NO", then may proceed with Cephalosporin use. Has patient had a PCN reaction causing immediate rash, facial/tongue/throat swelling, SOB or lightheadedness with hypotension: Unknown Has patient had a PCN reaction causing severe rash involving mucus membranes or skin necrosis: Unknown Has patient had a PCN reaction that required hospitalization: Unknown Has patient had a PCN reaction occurring within the last 10 years: Unknown If all of the above answers are "NO", then may proceed with Cephalosporin use.   Zafirlukast Other (See Comments) and Hives    Reaction: unknown Reaction: unknown   Keflex [Cephalexin] Rash    No  current facility-administered medications on file prior to encounter.   Current Outpatient Medications on File Prior to Encounter  Medication Sig Dispense Refill   acetaminophen (TYLENOL) 500 MG tablet Take 500 mg by mouth every 6 (six) hours as needed for mild pain or moderate pain.     enalapril (VASOTEC) 10 MG tablet TAKE 1 TABLET (10 MG TOTAL) BY MOUTH DAILY. PLEASE SCHEDULE AN OFFICE VISIT BEFORE ANYMORE REFILLS. 90 tablet 0   metoprolol tartrate (LOPRESSOR) 25 MG tablet Take 0.5 tablets (12.5  mg total) by mouth 2 (two) times daily. 90 tablet 0   acetaminophen (TYLENOL) 325 MG tablet Take 2 tablets (650 mg total) by mouth every 6 (six) hours as needed for mild pain (or Fever >/= 101). (Patient not taking: Reported on 05/11/2022)     torsemide (DEMADEX) 20 MG tablet Take in AM every other day (Patient not taking: Reported on 05/11/2022) 90 tablet 1    Review of Systems: A full ROS was attempted today and was not able to be performed due to dementia.  Physical Examination: Temp:  [96.5 F (35.8 C)-98.2 F (36.8 C)] 97.9 F (36.6 C) (01/30 0818) Pulse Rate:  [62-83] 68 (01/30 0818) Resp:  [16-20] 16 (01/30 0818) BP: (128-177)/(50-69) 148/62 (01/30 1100) SpO2:  [82 %-98 %] 98 % (01/30 0818)  General - well nourished, well developed, in no apparent distress.    Ophthalmologic - fundi not visualized due to noncooperation.    Cardiovascular - regular rhythm and rate  Neuro - awake, alert, eyes open, able to say "OK" "My name is" with mild dysarthia but not able to have other meaningful sentences, significant word salad and intangible words, not able to answer orientation questions. Not able to name and repeat, not following commands. Left gaze preference but able to have right gaze, tracking bilaterally, blinking to visual threat bilaterally. No significant facial droop. Tongue protrusion not cooperative. Bilateral UEs 4/5, no drift. Bilaterally mild withdraw to pain and 3/5 toe movement, symmetrical. Sensation, coordination and gait not tested.    Data Reviewed: MR ANGIO HEAD WO CONTRAST  Result Date: 05/12/2022 CLINICAL DATA:  Stroke follow-up.  History of dementia EXAM: MRA HEAD WITHOUT CONTRAST TECHNIQUE: Angiographic images of the Circle of Willis were acquired using MRA technique without intravenous contrast. COMPARISON:  Brain MRI from yesterday FINDINGS: Anterior circulation: No major branch occlusion, beading, or vascular malformation. Atheromatous changes with greatest  involvement in the left ACA where there is A2 segment tandem narrowings. Mild left MCA stenosis. Overall, intracranial atheromatous changes mild for age. Posterior circulation: Vertebral and basilar arteries are diffusely patent. Fetal type bilateral PCA flow with duplicated right posterior communicating artery/PCA. At least moderate right P3 segment narrowing. Negative for aneurysm. Anatomic variants: As above IMPRESSION: No emergent finding. Unremarkable appearance of the proximal left PCA. Intracranial atherosclerosis most notably affecting the right PCA and left ACA. Electronically Signed   By: Jorje Guild M.D.   On: 05/12/2022 10:49   US Carotid Bilateral  Result Date: 05/12/2022 CLINICAL DATA:  Hypertension, stroke EXAM: BILATERAL CAROTID DUPLEX ULTRASOUND TECHNIQUE: Pearline Cables scale imaging, color Doppler and duplex ultrasound were performed of bilateral carotid and vertebral arteries in the neck. COMPARISON:  None Available. FINDINGS: Criteria: Quantification of carotid stenosis is based on velocity parameters that correlate the residual internal carotid diameter with NASCET-based stenosis levels, using the diameter of the distal internal carotid lumen as the denominator for stenosis measurement. The following velocity measurements were obtained: RIGHT ICA: 78/14 cm/sec CCA: 90/3 cm/sec SYSTOLIC  ICA/CCA RATIO:  1.0 ECA: 46 cm/sec LEFT ICA: 82/15 cm/sec CCA: 16/10 cm/sec SYSTOLIC ICA/CCA RATIO:  1.5 ECA: 53 cm/sec RIGHT CAROTID ARTERY: Intimal thickening and bifurcation atherosclerotic plaque formation. No hemodynamically significant right ICA stenosis, velocity elevation, or turbulent flow. Degree of narrowing less than 50%. RIGHT VERTEBRAL ARTERY:  Normal antegrade flow LEFT CAROTID ARTERY: Similar interval thickening and bifurcation atherosclerotic plaque formation. No hemodynamically significant left ICA stenosis, velocity elevation, or turbulent flow. LEFT VERTEBRAL ARTERY:  Normal antegrade flow  IMPRESSION: Bilateral carotid atherosclerosis. Negative for stenosis. Degree of narrowing less than 50% bilaterally by ultrasound criteria. Patent antegrade vertebral flow bilaterally Electronically Signed   By: Jerilynn Mages.  Shick M.D.   On: 05/12/2022 09:43   MR BRAIN WO CONTRAST  Result Date: 05/11/2022 CLINICAL DATA:  Altered mental status EXAM: MRI HEAD WITHOUT CONTRAST TECHNIQUE: Multiplanar, multiecho pulse sequences of the brain and surrounding structures were obtained without intravenous contrast. COMPARISON:  11/21/2020 FINDINGS: Brain: Acute infarct of the ventral left thalamus. No acute or chronic hemorrhage. There is multifocal hyperintense T2-weighted signal within the white matter. Generalized volume loss. The midline structures are normal. Vascular: Major flow voids are preserved. Skull and upper cervical spine: Normal calvarium and skull base. Visualized upper cervical spine and soft tissues are normal. Sinuses/Orbits:No paranasal sinus fluid levels or advanced mucosal thickening. Small amount of bilateral mastoid fluid. Normal orbits. IMPRESSION: Acute left thalamic infarct.  No hemorrhage or mass effect. Electronically Signed   By: Ulyses Jarred M.D.   On: 05/11/2022 23:56   CT ABDOMEN PELVIS WO CONTRAST  Result Date: 05/08/2022 CLINICAL DATA:  Unwitnessed fall, dementia EXAM: CT ABDOMEN AND PELVIS WITHOUT CONTRAST TECHNIQUE: Multidetector CT imaging of the abdomen and pelvis was performed following the standard protocol without IV contrast. RADIATION DOSE REDUCTION: This exam was performed according to the departmental dose-optimization program which includes automated exposure control, adjustment of the mA and/or kV according to patient size and/or use of iterative reconstruction technique. COMPARISON:  03/08/2011 FINDINGS: Lower chest: Mild subpleural reticulation at the lung bases. Hepatobiliary: Unenhanced liver is unremarkable. Status post cholecystectomy. No intrahepatic or extrahepatic  ductal dilatation. Pancreas: Coarse parenchymal calcifications in the pancreatic head/uncinate process, likely reflecting sequela of prior/chronic pancreatitis. Associated 13 mm pseudocyst in the uncinate process (series 2/image 33), unchanged from 2012, benign. Spleen: Calcified splenic granulomata. Adrenals/Urinary Tract: Adrenal glands are within normal limits. 18 mm hyperdense lesion in the lateral left upper kidney (series 2/image 33), favoring a benign hemorrhagic cyst, but technically indeterminate. Given the patient's age, no follow-up is recommended. Two nonobstructing left lower pole renal calculi measuring up to 3 mm (series 2/image 40). No hydronephrosis. Bladder is within normal limits. Stomach/Bowel: Stomach is notable for a moderate hiatal hernia. No evidence of bowel obstruction. Appendix is not discretely visualized. Extensive colonic diverticulosis, without evidence of diverticulitis. Vascular/Lymphatic: No evidence of abdominal aortic aneurysm. Atherosclerotic calcifications of the abdominal aorta and branch vessels. No suspicious abdominopelvic lymphadenopathy. Reproductive: Status post hysterectomy.  No adnexal masses. Other: Trace pelvic ascites. No free air. Musculoskeletal: Mild degenerative changes of the visualized thoracolumbar spine. Old left superior and inferior pubic rami fracture deformities. No acute fracture is seen. IMPRESSION: No evidence of traumatic injury to the abdomen/pelvis. Additional ancillary findings as above. Electronically Signed   By: Julian Hy M.D.   On: 05/08/2022 22:35   CT Cervical Spine Wo Contrast  Result Date: 05/08/2022 CLINICAL DATA:  Neck trauma. EXAM: CT CERVICAL SPINE WITHOUT CONTRAST TECHNIQUE: Multidetector CT imaging of the cervical spine  was performed without intravenous contrast. Multiplanar CT image reconstructions were also generated. RADIATION DOSE REDUCTION: This exam was performed according to the departmental dose-optimization  program which includes automated exposure control, adjustment of the mA and/or kV according to patient size and/or use of iterative reconstruction technique. COMPARISON:  None Available. FINDINGS: Despite efforts by the technologist and patient, motion artifact is present on today's exam and could not be eliminated. This reduces exam sensitivity and specificity. Alignment: There is 1-2 mm grade 1 anterolisthesis of C3 on C4, 2 mm grade 1 anterolisthesis of C4 on C5, 2 mm grade 1 anterolisthesis of C5 on C6, 2 mm grade 1 anterolisthesis of C7 on T1, and 2 mm grade 1 anterolisthesis of T1 on T2. Mild levocurvature centered at C4. Skull base and vertebrae: Moderate degenerative changes at the atlantodens interval. Vertebral body heights are maintained. Moderate to severe C5-6 and C6-7 disc space narrowing with high-grade C6-7 endplate sclerosis and endplate osteophytes. Soft tissues and spinal canal: No prevertebral fluid or swelling. No visible canal hematoma. Disc levels: Multilevel degenerative disc changes including disc space narrowing, uncovertebral hypertrophy, and facet joint hypertrophy contribute to mild left C2-3, moderate left and mild right C3-4, moderate to severe right C4-5, mild left C5-6, mild to moderate right and mild left C6-7 neuroforaminal stenosis. Upper chest: There is chronic interlobular septal thickening and scarring within the bilateral lung apices. Other: No cervical chain lymphadenopathy. Moderate atherosclerotic calcifications. IMPRESSION: 1. No acute fracture. 2. Multilevel degenerative disc and joint changes as above. Electronically Signed   By: Yvonne Kendall M.D.   On: 05/08/2022 16:57   CT Head Wo Contrast  Result Date: 05/08/2022 CLINICAL DATA:  Mental status change.  Unknown cause. EXAM: CT HEAD WITHOUT CONTRAST TECHNIQUE: Contiguous axial images were obtained from the base of the skull through the vertex without intravenous contrast. RADIATION DOSE REDUCTION: This exam was  performed according to the departmental dose-optimization program which includes automated exposure control, adjustment of the mA and/or kV according to patient size and/or use of iterative reconstruction technique. COMPARISON:  CT brain 07/21/2020 FINDINGS: Brain: There is moderate cortical atrophy, unchanged from prior within normal limits for patient age. The ventricles are normal in configuration. The basilar cisterns are patent. No mass, mass effect, or midline shift. No acute intracranial hemorrhage is seen. No abnormal extra-axial fluid collection. Mild patchy periventricular white matter hypodensities, likely chronic ischemic white matter changes. Preservation of the normal cortical gray-white interface without CT evidence of an acute major vascular territorial cortical based infarction. Vascular: There are high-grade atherosclerotic intracranial calcifications again seen. No hyperdense vessel or unexpected calcification. Skull: Normal. Negative for fracture or focal lesion. Sinuses/Orbits: Status post bilateral lens replacements, partially visualized. The visualized portions of the frontal and ethmoid air cells are clear. The visualized portions of the bilateral mastoid air cells are clear. The visualized paranasal sinuses and mastoid air cells are clear. Other: None. IMPRESSION: 1. No acute intracranial process. 2. Moderate cortical atrophy and mild chronic ischemic white matter changes. Electronically Signed   By: Yvonne Kendall M.D.   On: 05/08/2022 16:42   DG Chest 1 View  Result Date: 05/08/2022 CLINICAL DATA:  Altered mental status. EXAM: CHEST  1 VIEW COMPARISON:  07/21/2020. FINDINGS: Clear lungs. Stable cardiac and mediastinal contours. No pleural effusion or pneumothorax. Visualized bones and upper abdomen are unremarkable. IMPRESSION: No evidence of acute cardiopulmonary disease. Electronically Signed   By: Emmit Alexanders M.D.   On: 05/08/2022 16:12    Assessment: 89 y.o.  female with PMH of  dementia, HTN and UTI admitted for AMS, found down at home, and encephalopathy. Pt symptoms did not respond well to Abx, so MRI was done and found to have left thalamic infarct, more likely small vessel disease and able to explain her symptoms. MRA Intracranial atherosclerosis most notably affecting the right PCA and left ACA. CUS unremarkable. LDL 95 and A1C pending. TTE pending. UA WBC 21-50  Stroke Risk Factors - hypertension  Plan: Frequent neuro checks Telemetry monitoring Echocardiogram pending Follow up HgbA1C PT/OT/speech consult Gradually normalize BP in 2-3 days GI and DVT prophylaxis  Continue ASA and plavix DAPT for 3 weeks and then ASA alone Add low dose statin crestor 10, no high intensity given advance age and LDL not far from goal < 70 UTI treatment per primary team Discussed with Dr. Vic Blackbird attending physician  Thank you for this consultation and allowing [place] to participate in the care of this patient. Neurology will sign off. Please call with questions. Pt will follow up with [facility] clinic neurology in about 4 weeks. Thanks for the consult.  Rosalin Hawking, MD PhD Stroke Neurology 05/12/2022 12:31 PM

## 2022-05-12 NOTE — Progress Notes (Signed)
*  PRELIMINARY RESULTS* Echocardiogram 2D Echocardiogram has been performed.  Elpidio Anis 05/12/2022, 3:48 PM

## 2022-05-12 NOTE — Evaluation (Signed)
Occupational Therapy Evaluation Patient Details Name: [name] MRN: [id number] DOB: 03/16/1925 Today's Date: 05/12/2022   History of Present Illness presented to ER secondary to fall, AMS; admitted for management of UTI.  Hospital course complicated by period of decreased alertness, decreased functional status; noted with acute CVA (L thalamic infarct)   Clinical Impression   Patient presenting with decreased independence in self care, balance, functional mobility/transfers, endurance, and safety awareness. PTA pt lived alone with PCA for several hours a day and daily check ins from family. Pt received assistance for all ADLs/IADLs and used a RW for functional mobility in the home. Pt currently functioning at Mod-Max A +1 for bed mobility and Mod A +2 for stand pivot transfer to bedside recliner using RW. She is currently requiring Max A for all ADL tasks. Pt experiencing R sided weakness, impaired coordination, and R side inattention. She also demonstrated expressive difficulties with only offering 1-2 word responses and required mulitmodal cues for sequencing/initiation. Pt will benefit from acute OT to increase overall independence in the areas of ADLs and functional mobility in order to safely discharge to next venue of care. Upon hospital discharge, recommend STR to maximize pt safety and return to PLOF.    Recommendations for follow up therapy are one component of a multi-disciplinary discharge planning process, led by the attending physician.  Recommendations may be updated based on patient status, additional functional criteria and insurance authorization.   Follow Up Recommendations  Skilled nursing-short term rehab (<3 hours/day)     Assistance Recommended at Discharge Frequent or constant Supervision/Assistance  Patient can return home with the following Assistance with cooking/housework;Direct supervision/assist for financial management;Assist for transportation;Help with stairs  or ramp for entrance;A lot of help with bathing/dressing/bathroom;A lot of help with walking and/or transfers    Functional Status Assessment  Patient has had a recent decline in their functional status and demonstrates the ability to make significant improvements in function in a reasonable and predictable amount of time.  Equipment Recommendations  Other (comment) (defer to next venue of care)    Recommendations for Other Services       Precautions / Restrictions Precautions Precautions: Fall Restrictions Weight Bearing Restrictions: No      Mobility Bed Mobility Overal bed mobility: Needs Assistance Bed Mobility: Supine to Sit     Supine to sit: Mod assist, Max assist     General bed mobility comments: mulitmodal cues, difficulty with initation/sequencing movement/motor planning    Transfers Overall transfer level: Needs assistance Equipment used: Rolling walker (2 wheels) Transfers: Sit to/from Stand, Bed to chair/wheelchair/BSC Sit to Stand: Mod assist, +2 safety/equipment Stand pivot transfers: Mod assist, +2 safety/equipment         General transfer comment: hand-over-hand for UE position on RW (R > L), assist for lift off, stabilization.  Mod/max cuing for walker management; requiring constant manual cuing for weight shift to unweight/advance LEs (though accepts weight through extremities without buckling)      Balance Overall balance assessment: Needs assistance Sitting-balance support: Feet supported, No upper extremity supported Sitting balance-Leahy Scale: Fair     Standing balance support: Bilateral upper extremity supported Standing balance-Leahy Scale: Poor Standing balance comment: +1 hands on assist at all times                           ADL either performed or assessed with clinical judgement   ADL Overall ADL's : Needs assistance/impaired     Grooming:  Maximal assistance;Sitting;Cueing for sequencing Grooming Details (indicate  cue type and reason): hand over hand assist required                 Toilet Transfer: Stand-pivot;Rolling walker (2 wheels);Moderate assistance;+2 for physical assistance Toilet Transfer Details (indicate cue type and reason): simulated Toileting- Clothing Manipulation and Hygiene: Maximal assistance;Sit to/from stand         General ADL Comments: Max A overall for self-care tasks     Vision   Additional Comments: R side inattention (multimodal cues to attend to R side), L sided gaze preference noted     Perception     Praxis      Pertinent Vitals/Pain Pain Assessment Pain Assessment: Faces Faces Pain Scale: No hurt     Hand Dominance Right   Extremity/Trunk Assessment Upper Extremity Assessment Upper Extremity Assessment: Generalized weakness;RUE deficits/detail RUE Deficits / Details: grossly 3-/5, + drift RUE Coordination: decreased fine motor;decreased gross motor   Lower Extremity Assessment Lower Extremity Assessment: Generalized weakness       Communication Communication Communication: Expressive difficulties;HOH   Cognition Arousal/Alertness: Awake/alert Behavior During Therapy: Flat affect Overall Cognitive Status: History of cognitive impairments - at baseline                                 General Comments: Per discussion with daughter on 1/30 when attempting initial eval, pt is normally A&O although has "mild dementia." Today, pt demonstrated delay in processing, able to provide some answers with 1-2 word phrases, expressive difficulties. Mulitmodal cues for all activities this date.     General Comments       Exercises Other Exercises Other Exercises: OT provided education re: role of OT, OT POC, post acute recs, sitting up for all meals, EOB/OOB mobility with assistance, home/fall safety.     Shoulder Instructions      Home Living Family/patient expects to be discharged to:: Private residence Living Arrangements:  Alone Available Help at Discharge: Family;Available PRN/intermittently;Personal care attendant (caregiver 10-3 daily; family provides check in/assist before and after) Type of Home: House                       Home Equipment: Conservation officer, nature (2 wheels)          Prior Functioning/Environment Prior Level of Function : Needs assist             Mobility Comments: Per physician (obtained through family), patient ambulatory for household distances with RW and +1 assist ADLs Comments: Caregiver/family provides assistance for all ADLs and IADLs (meals, med mgmt, driving)        OT Problem List: Decreased strength;Decreased activity tolerance;Impaired balance (sitting and/or standing);Decreased cognition;Decreased safety awareness;Decreased coordination;Decreased knowledge of use of DME or AE;Decreased knowledge of precautions;Impaired vision/perception;Cardiopulmonary status limiting activity      OT Treatment/Interventions: Self-care/ADL training;Therapeutic exercise;Therapeutic activities;Cognitive remediation/compensation;DME and/or AE instruction;Patient/family education;Balance training;Energy conservation;Visual/perceptual remediation/compensation    OT Goals(Current goals can be found in the care plan section) Acute Rehab OT Goals Patient Stated Goal: none stated OT Goal Formulation: Patient unable to participate in goal setting Time For Goal Achievement: 05/26/22 Potential to Achieve Goals: Fair   OT Frequency: Min 2X/week    Co-evaluation PT/OT/SLP Co-Evaluation/Treatment: Yes Reason for Co-Treatment: Necessary to address cognition/behavior during functional activity;For patient/therapist safety;To address functional/ADL transfers PT goals addressed during session: Mobility/safety with mobility;Balance;Proper use of DME OT goals addressed during session: ADL's  and self-care      AM-PAC OT "6 Clicks" Daily Activity     Outcome Measure Help from another person  eating meals?: A Lot Help from another person taking care of personal grooming?: A Lot Help from another person toileting, which includes using toliet, bedpan, or urinal?: A Lot Help from another person bathing (including washing, rinsing, drying)?: A Lot Help from another person to put on and taking off regular upper body clothing?: A Lot Help from another person to put on and taking off regular lower body clothing?: A Lot 6 Click Score: 12   End of Session Equipment Utilized During Treatment: Gait belt;Rolling walker (2 wheels) Nurse Communication: Mobility status  Activity Tolerance: Patient tolerated treatment well Patient left: in chair;with call bell/phone within reach;with chair alarm set;Other (comment) (RN and student present)  OT Visit Diagnosis: Other symptoms and signs involving cognitive function;Other abnormalities of gait and mobility (R26.89);Muscle weakness (generalized) (M62.81)                Time: 8309-4076 OT Time Calculation (min): 18 min Charges:  OT General Charges $OT Visit: 1 Visit OT Evaluation $OT Eval Moderate Complexity: 1 Mod  [facility] MS, OTR/L ascom [phone]  05/12/22, 2:59 PM

## 2022-05-12 NOTE — Progress Notes (Signed)
OT Cancellation Note  Patient Details Name: [name] MRN: [id number] DOB: February 08, 1925   Cancelled Treatment:    Reason Eval/Treat Not Completed: Patient at procedure or test/ unavailable. Pt currently off the floor at imaging. Will re-attempt as able.   Doneta Public 05/12/2022, 9:45 AM

## 2022-05-13 DIAGNOSIS — I639 Cerebral infarction, unspecified: Secondary | ICD-10-CM | POA: Diagnosis not present

## 2022-05-13 MED ORDER — METOPROLOL TARTRATE 25 MG PO TABS
12.5000 mg | ORAL_TABLET | Freq: Two times a day (BID) | ORAL | Status: DC
  Administered 2022-05-13 – 2022-05-14 (×3): 12.5 mg via ORAL
  Filled 2022-05-13 (×3): qty 1

## 2022-05-13 NOTE — Progress Notes (Addendum)
Speech Language Pathology Treatment: Dysphagia  Patient Details Name: [name] MRN: [id number] DOB: 1924/06/23 Today's Date: 05/13/2022 Time: 0347-4259 SLP Time Calculation (min) (ACUTE ONLY): 30 min  Assessment / Plan / Recommendation Clinical Impression  Pt seen today for Dysphagia treatment. Pt appeared cooperative, alert, and intermittently confused. Pt was verbal w/ SLP, answering basic questions and conversing readily w/ verbal cues -- pt's responses were often unclear to SLP and did not address the question/statement (tangential). Suspect pt may have some cognitive/communication deficits in setting of baseline Dementia and new CVA. Will address as pt continues to improve.  OF NOTE: Upon entering room, noted pt had a mild cough and throat clearing PRIOR TO any po's. Pt's great-granddaughter and her husband present upon SLP arrival. Great-granddaughter reported having fed ~3 oz thickened liquid to pt w/ no overt s/s of aspiration. Nursing present intermittently to assist w/ set up and adjusting IV. Nursing reported she did well with diet and thickened liquids at breakfast w/ no overt s/s of aspiration. Pt left sitting up in chair w/ call button in reach. Pt on RA; afebrile; WBC WNL.  Pt appears to present w/ pharyngeal phase, and potential pharyngoesophageal phase, dysphagia w/ pharyngeal phase deficits noted and suspected sensorineural deficits. Also suspect impact from declined Cognitive status (baseline Dementia) impacting her overall awareness/engagement during po tasks -- ANY Cognitive decline can impact oropharyngeal phase swallowing which increases risk for aspiration. Pt required Min-Mod tactile/verbal/visual cues for orientation to bolus presentation, follow through w/ tasks, and self-feeding support. She was often quiet and less engaged b/t trials if not given continuous stimulation.   Pt consumed trials of Nectar thick liquids via TSP (~3 oz), purees (x4 bites), and minced/soft  solids (x6-8 bites) fed by SLP. W/ trials of Nectar liquids via TSP, delayed cough (x2, hiccups prior) -- no immediate cough and no decline in respiratory presentation noted during/post these trials. Intermittent mildly wet vocal quality was noted which pt cleared w/ throat clearing -- unsure if different from her baseline presentation upon entering room. Suspect decreased attention to task as well as potential delayed pharyngeal swallowing initiation (d/t decreased awareness) could have impacted safety and success of pharyngeal swallow w/ nectar thick liquids. Noted pt exhibited ~2 hiccups post ~10 sips of nectar thick liquids; she followed this w/ a mild, congested cough -- unsure if this could be related to min retrograde flow from the Esophagus(?). Min more Time given BETWEEN trials to allow for Esophageal clearing. Noted no coughing b/t or post trials of puree and minced/soft solids.  Oral phase was c/b min slower bolus management and oral clearing of all boluses given, but overall functional. She demonstrated adequate bolus control/management, lingual sweeping to clear buccal space/oral cavity, and clear oral cavity post po's. She required min increased Time for mastication and A-P transfer w/ increased texture of mech soft foods -- unsure if related to overall weakness, fatigue, or decreased attention/awareness. Time b/t trials required for pt to fully clear solid foods.  Pt's vocal quality was min low but speech was intelligible.    Recommend continue dysphagia level 3 diet (minced meats) w/ Nectar consistency liquids via TSP; aspiration precautions; Pills Crushed vs Whole in puree for safety; feeding support and Supervision at meals, reduce Distractions during meals and give time for oral clearing during/post intake. NSG/MD updated.  ST services will continue to follow for toleration of diet, education w/ family, and trials to upgrade as indicated in the next 1-2 days. Nursing updated.  HPI  HPI: Pt is a 89 y.o. Caucasian female with PMH of dementia, HTN and UTI admitted for AMS, found down at home. Per chart, pt was found by health care aide on the floor not able to get up on 1/26. After that pt seems to have acute functional decline, confused, with intermittent agitation, more encephalopathic. Given her UTI hx and she was admitted for UTI encephalopathy and started on Rocephin treatment. 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 Neurology was called for management. Per NSG and family report, pt has been "out of it" and "not eating/drinking much" since admit but now having overt coughing w/ thin liquids.      SLP Plan  Continue with current plan of care      Recommendations for follow up therapy are one component of a multi-disciplinary discharge planning process, led by the attending physician.  Recommendations may be updated based on patient status, additional functional criteria and insurance authorization.    Recommendations  Diet recommendations: Dysphagia 3 (mechanical soft);Nectar-thick liquid Liquids provided via: Teaspoon Medication Administration: Whole meds with puree Supervision: Trained caregiver to feed patient Compensations: Minimize environmental distractions;Slow rate;Small sips/bites;Clear throat intermittently Postural Changes and/or Swallow Maneuvers: Seated upright 90 degrees;Upright 30-60 min after meal                Oral Care Recommendations: Oral care QID;Oral care before and after PO;Staff/trained caregiver to provide oral care Follow Up Recommendations: Skilled nursing-short term rehab (<3 hours/day) Assistance recommended at discharge: Frequent or constant Supervision/Assistance SLP Visit Diagnosis: Dysphagia, pharyngeal phase (R13.13);Dysphagia, pharyngoesophageal phase (R13.14) Plan: Continue with current plan of care         Randall Hiss Graduate Clinician [place],  Speech Pathology  Randall Hiss 05/13/2022, 2:02 PM   The information in this patient note, response to treatment, and overall treatment plan developed has been reviewed and agreed upon after reviewing documentation. This session was performed under the supervision of this licensed clinician.   Orinda Kenner, [place], [place] [phone] 05/13/2022; 1659pm

## 2022-05-13 NOTE — Progress Notes (Signed)
PROGRESS NOTE  [name]  DUK:[id number] DOB: 09/29/1924 DOA: 05/08/2022 PCP: Mikey Kirschner, PA-C   Brief Narrative:  [name] is a 89 y.o. female with medical history significant of dementia.  Therefore patient is not able to provide history of present illness.  Apparently patient was in her usual state of health till last evening.  Earlier this morning when her home health aide returned, patient was found to be on the floor unable to get back up.  Patient was subsequently helped up and seemed to function okay till around 1 PM.  At the time it was noted that the patient again had a marked decline in functional status and basically remained bedbound and did not get up.  Per usual, patient does get up and move around and interact more.  Patient has been noted to be less interactive today has been referring to her son who had passed away a couple of years ago as well as to her sister who passed away even longer ago.  And she has also been agitated or upset with the granddaughter.  Which is new.  She has not been able to recognize her granddaughter which is new as well   ER course is notable for patient being diagnosed with urinary tract infection and a medical evaluation is sought.  1/29.  Patient remains encephalopathic.  Per granddaughter at bedside she is concerned about decline since admission.  At baseline patient is alert and oriented.  She is able to hold full conversations.  Patient is on culture appropriate antibiotics for treatment of urinary tract infection.  Vital signs remained stable however mental status remains poor.  1/30: MRI reviewed.  New left thalamic infarct.  Neurology consulted.  Plan for DAPT.  No indication for intervention.  Patient mental status remains poor but slightly improved from prior.  1/31 Patient stable, tolerating DAPT/Statin, etc. No acute overnight events.  Assessment & Plan:   Principal Problem:   CVA (cerebral vascular accident) ([place]) Active  Problems:   Primary hypertension   Urinary tract infection   AMS (altered mental status)   Dementia without behavioral disturbance ([place])   Lower abdominal pain   Acute metabolic encephalopathy  Acute left thalamic infarct Noted on MRI imaging survey on 1/29.  Neurology consulted. Plan: DAPT with aspirin and Plavix x 3 weeks followed by aspirin monotherapy indefinitely Low-dose Crestor 10 mg daily Slowly control blood pressure over the next 2 to 3 days PT OT SLP -- recommending short term rehab Frequent neurochecks Telemetry Unremarkable echocardiogram on 3/76  Acute metabolic encephalopathy Now suspected secondary to acute CVA.  Unable to rule out contribution from urinary tract infection. Plan: Last dose of IV Rocephin 1/30, completed course Delirium precautions  Urinary tract infection Klebsiella isolated from urine.  Essentially pansensitive. Completed antibiotics 1/30.  Generalized weakness with fall Current recommendation for skilled nursing facility placement  Dementia without behavioral disturbance Patient has good support structure at home.   Patient's granddaughter is healthcare power of attorney.   Palliative care consult called, appreciate recommendations  Hypertension Antihypertensives on hold initally. Will restart home lopressor today, and likely ace in the coming days.  DVT prophylaxis: SQ Lovenox Code Status: DNR Family Communication: No family present, left message for Robin this AM.  Disposition Plan: Status is: Inpatient Remains inpatient appropriate because: Acute CVA  Level of care: Med-Surg  Consultants:  Palliative care Neurology  Procedures:  None  Antimicrobials: Ceftriaxone   Subjective: Seen and examined this AM lying in bed.  Appears lethargic but able to tell me here name, not much else.  Objective: Vitals:   05/12/22 1631 05/12/22 2109 05/13/22 0349 05/13/22 0742  BP: (!) 154/67 (!) 140/67 (!) 157/65 (!) 151/60  Pulse:  64 69 72 71  Resp: 16 19 18 18   Temp: (!) 97.2 F (36.2 C) 98.9 F (37.2 C) 97.8 F (36.6 C) 97.9 F (36.6 C)  TempSrc:   Oral Oral  SpO2: 98%  97% 100%  Weight:      Height:        Intake/Output Summary (Last 24 hours) at 05/13/2022 1140 Last data filed at 05/13/2022 0900 Gross per 24 hour  Intake 600 ml  Output 900 ml  Net -300 ml    Filed Weights   05/08/22 1540 05/08/22 2038  Weight: 54.4 kg 54.8 kg   Examination: General:  Elderly frail appearing woman resting comfortably, no acute distress Eyes: EOMI, clear conjuctivae, white sclerea Neck: supple, no masses, trachea mildline  Cardiovascular: RRR, no murmurs or rubs, no peripheral edema  Respiratory: clear to auscultation bilaterally, no wheezes, no crackles  Abdomen: soft, nontender, nondistended, normal bowel tones heard  Skin: dry, no rashes  Musculoskeletal: no joint effusions, normal range of motion  Neurologic: extraocular muscles intact, clear speech, moving all extremities with intact sensorium  Data Reviewed: I have personally reviewed following labs and imaging studies  CBC: Recent Labs  Lab 05/08/22 1545 05/09/22 0549 05/11/22 1115 05/12/22 0451  WBC 6.7 4.9 14.1* 10.0  NEUTROABS 4.6  --  8.8* 7.4  HGB 11.2* 11.4* 12.9 11.1*  HCT 33.7* 34.1* 38.8 33.2*  MCV 96.0 94.7 95.6 95.1  PLT 163 145* 144* 948    Basic Metabolic Panel: Recent Labs  Lab 05/08/22 1545 05/09/22 0549 05/11/22 1115 05/12/22 0451  NA 139 137 131* 130*  K 3.6 3.7 4.0 3.8  CL 108 107 100 101  CO2 23 24 19* 22  GLUCOSE 96 97 87 113*  BUN 21 18 33* 33*  CREATININE 1.25* 1.12* 1.30* 1.28*  CALCIUM 9.4 8.9 8.8* 8.6*    GFR: Estimated Creatinine Clearance: 20.8 mL/min (A) (by C-G formula based on SCr of 1.28 mg/dL (H)). Liver Function Tests: Recent Labs  Lab 05/08/22 1545  AST 22  ALT 9  ALKPHOS 82  BILITOT 0.7  PROT 6.6  ALBUMIN 3.8    No results for input(s): "LIPASE", "AMYLASE" in the last 168 hours. No  results for input(s): "AMMONIA" in the last 168 hours. Coagulation Profile: Recent Labs  Lab 05/08/22 2040  INR 1.0    Cardiac Enzymes: No results for input(s): "CKTOTAL", "CKMB", "CKMBINDEX", "TROPONINI" in the last 168 hours. BNP (last 3 results) No results for input(s): "PROBNP" in the last 8760 hours. HbA1C: Recent Labs    05/12/22 0451  HGBA1C 5.1    CBG: Recent Labs  Lab 05/08/22 1551 05/09/22 1240  GLUCAP 104* 183*    Lipid Profile: Recent Labs    05/12/22 0451  CHOL 152  HDL 37*  LDLCALC 95  TRIG 101  CHOLHDL 4.1    Thyroid Function Tests: No results for input(s): "TSH", "T4TOTAL", "FREET4", "T3FREE", "THYROIDAB" in the last 72 hours. Anemia Panel: No results for input(s): "VITAMINB12", "FOLATE", "FERRITIN", "TIBC", "IRON", "RETICCTPCT" in the last 72 hours. Sepsis Labs: Recent Labs  Lab 05/12/22 0451  PROCALCITON 0.68     Recent Results (from the past 240 hour(s))  Urine Culture (for pregnant, neutropenic or urologic patients or patients with an indwelling urinary catheter)  Status: Abnormal   Collection Time: 05/08/22  6:46 PM   Specimen: Urine, Random  Result Value Ref Range Status   Specimen Description   Final    URINE, RANDOM Performed at [facility], [address] [place], [place] [address]    Special Requests   Final    NONE Performed at [facility], [address] [place], [place] [address]    Culture >=100,000 COLONIES/mL KLEBSIELLA OXYTOCA (A)  Final   Report Status 05/11/2022 FINAL  Final   Organism ID, Bacteria KLEBSIELLA OXYTOCA (A)  Final      Susceptibility   Klebsiella oxytoca - MIC*    AMPICILLIN RESISTANT Resistant     CEFAZOLIN <=4 SENSITIVE Sensitive     CEFEPIME <=0.12 SENSITIVE Sensitive     CEFTRIAXONE <=0.25 SENSITIVE Sensitive     CIPROFLOXACIN <=0.25 SENSITIVE Sensitive     GENTAMICIN <=1 SENSITIVE Sensitive     IMIPENEM 0.5 SENSITIVE Sensitive     NITROFURANTOIN 64 INTERMEDIATE  Intermediate     TRIMETH/SULFA <=20 SENSITIVE Sensitive     AMPICILLIN/SULBACTAM <=2 SENSITIVE Sensitive     PIP/TAZO <=4 SENSITIVE Sensitive     * >=100,000 COLONIES/mL KLEBSIELLA OXYTOCA    Radiology Studies: ECHOCARDIOGRAM COMPLETE  Result Date: 05/12/2022    ECHOCARDIOGRAM REPORT   Patient Name:   ANONA GIOVANNINI Date of Exam: 05/12/2022 Medical Rec #:  [id number]     Height:       62.0 in Accession #:    [id number]    Weight:       120.9 lb Date of Birth:  [birth year]    BSA:          1.544 m Patient Age:    89 years      BP:           148/62 mmHg Patient Gender: F             HR:           64 bpm. Exam Location:  [facility] Procedure: 2D Echo, Cardiac Doppler and Color Doppler Indications:     Stroke  History:         Patient has prior history of Echocardiogram examinations, most                  recent 11/22/2020. CHF, Stroke, Signs/Symptoms:Altered Mental                  Status; Risk Factors:Hypertension.  Sonographer:     Wenda Low Referring Phys:  [id number] Cornelius Moras XU Diagnosing Phys: Nelva Bush MD IMPRESSIONS  1. Left ventricular ejection fraction, by estimation, is 65 to 70%. The left ventricle has normal function. Left ventricular endocardial border not optimally defined to evaluate regional wall motion. There is mild left ventricular hypertrophy. Left ventricular diastolic parameters were normal.  2. Right ventricular systolic function is normal. The right ventricular size is normal. Mildly increased right ventricular wall thickness.  3. The mitral valve was not well visualized. Mild mitral valve regurgitation. No evidence of mitral stenosis.  4. Tricuspid valve regurgitation is mild to moderate.  5. The aortic valve is tricuspid. There is mild calcification of the aortic valve. There is mild thickening of the aortic valve. Aortic valve regurgitation is mild. Aortic valve sclerosis/calcification is present, without any evidence of aortic stenosis. FINDINGS  Left Ventricle: Left ventricular  ejection fraction, by estimation, is 65 to 70%. The left ventricle has normal function. Left ventricular endocardial border not optimally defined to  evaluate regional wall motion. The left ventricular internal cavity size was normal in size. There is mild left ventricular hypertrophy. Left ventricular diastolic parameters were normal. Right Ventricle: Pulmonary artery pressure is upper normal to mildly elevated (RVSP 30-35 mmHg plus central venous pressure). The right ventricular size is normal. Mildly increased right ventricular wall thickness. Right ventricular systolic function is normal. Left Atrium: Left atrial size was normal in size. Right Atrium: Right atrial size was normal in size. Pericardium: There is no evidence of pericardial effusion. Mitral Valve: The mitral valve was not well visualized. Mild mitral valve regurgitation. No evidence of mitral valve stenosis. MV peak gradient, 3.5 mmHg. The mean mitral valve gradient is 1.0 mmHg. Tricuspid Valve: The tricuspid valve is not well visualized. Tricuspid valve regurgitation is mild to moderate. Aortic Valve: The aortic valve is tricuspid. There is mild calcification of the aortic valve. There is mild thickening of the aortic valve. Aortic valve regurgitation is mild. Aortic valve sclerosis/calcification is present, without any evidence of aortic stenosis. Aortic valve mean gradient measures 3.0 mmHg. Aortic valve peak gradient measures 6.2 mmHg. Aortic valve area, by VTI measures 2.79 cm. Pulmonic Valve: The pulmonic valve was not well visualized. Pulmonic valve regurgitation is mild. No evidence of pulmonic stenosis. Aorta: The aortic root is normal in size and structure. Pulmonary Artery: The pulmonary artery is of normal size. Venous: The inferior vena cava was not well visualized. IAS/Shunts: The interatrial septum was not well visualized.  LEFT VENTRICLE PLAX 2D LVIDd:         4.10 cm   Diastology LVIDs:         2.20 cm   LV e' medial:    9.90 cm/s  LV PW:         1.00 cm   LV E/e' medial:  8.4 LV IVS:        1.20 cm   LV e' lateral:   10.00 cm/s LVOT diam:     2.00 cm   LV E/e' lateral: 8.3 LV SV:         82 LV SV Index:   53 LVOT Area:     3.14 cm  RIGHT VENTRICLE RV Basal diam:  2.90 cm RV Mid diam:    2.50 cm LEFT ATRIUM             Index        RIGHT ATRIUM           Index LA diam:        3.20 cm 2.07 cm/m   RA Area:     13.70 cm LA Vol (A2C):   53.9 ml 34.92 ml/m  RA Volume:   32.80 ml  21.25 ml/m LA Vol (A4C):   32.2 ml 20.86 ml/m LA Biplane Vol: 44.5 ml 28.83 ml/m  AORTIC VALVE                    PULMONIC VALVE AV Area (Vmax):    2.41 cm     PV Vmax:       1.09 m/s AV Area (Vmean):   2.30 cm     PV Peak grad:  4.8 mmHg AV Area (VTI):     2.79 cm AV Vmax:           125.00 cm/s AV Vmean:          87.400 cm/s AV VTI:            0.294 m AV Peak Grad:  6.2 mmHg AV Mean Grad:      3.0 mmHg LVOT Vmax:         95.80 cm/s LVOT Vmean:        64.000 cm/s LVOT VTI:          0.261 m LVOT/AV VTI ratio: 0.89  AORTA Ao Root diam: 3.10 cm Ao Asc diam:  2.90 cm MITRAL VALVE               TRICUSPID VALVE MV Area (PHT): 2.67 cm    TR Peak grad:   31.6 mmHg MV Area VTI:   2.96 cm    TR Vmax:        281.00 cm/s MV Peak grad:  3.5 mmHg MV Mean grad:  1.0 mmHg    SHUNTS MV Vmax:       0.93 m/s    Systemic VTI:  0.26 m MV Vmean:      49.0 cm/s   Systemic Diam: 2.00 cm MV Decel Time: 284 msec MV E velocity: 82.80 cm/s MV A velocity: 85.40 cm/s MV E/A ratio:  0.97 Harrell Gave End MD Electronically signed by Nelva Bush MD Signature Date/Time: 05/12/2022/4:36:32 PM    Final    MR ANGIO HEAD WO CONTRAST  Result Date: 05/12/2022 CLINICAL DATA:  Stroke follow-up.  History of dementia EXAM: MRA HEAD WITHOUT CONTRAST TECHNIQUE: Angiographic images of the Circle of Willis were acquired using MRA technique without intravenous contrast. COMPARISON:  Brain MRI from yesterday FINDINGS: Anterior circulation: No major branch occlusion, beading, or vascular malformation.  Atheromatous changes with greatest involvement in the left ACA where there is A2 segment tandem narrowings. Mild left MCA stenosis. Overall, intracranial atheromatous changes mild for age. Posterior circulation: Vertebral and basilar arteries are diffusely patent. Fetal type bilateral PCA flow with duplicated right posterior communicating artery/PCA. At least moderate right P3 segment narrowing. Negative for aneurysm. Anatomic variants: As above IMPRESSION: No emergent finding. Unremarkable appearance of the proximal left PCA. Intracranial atherosclerosis most notably affecting the right PCA and left ACA. Electronically Signed   By: Jorje Guild M.D.   On: 05/12/2022 10:49   US Carotid Bilateral  Result Date: 05/12/2022 CLINICAL DATA:  Hypertension, stroke EXAM: BILATERAL CAROTID DUPLEX ULTRASOUND TECHNIQUE: Pearline Cables scale imaging, color Doppler and duplex ultrasound were performed of bilateral carotid and vertebral arteries in the neck. COMPARISON:  None Available. FINDINGS: Criteria: Quantification of carotid stenosis is based on velocity parameters that correlate the residual internal carotid diameter with NASCET-based stenosis levels, using the diameter of the distal internal carotid lumen as the denominator for stenosis measurement. The following velocity measurements were obtained: RIGHT ICA: 78/14 cm/sec CCA: 33/5 cm/sec SYSTOLIC ICA/CCA RATIO:  1.0 ECA: 46 cm/sec LEFT ICA: 82/15 cm/sec CCA: 45/62 cm/sec SYSTOLIC ICA/CCA RATIO:  1.5 ECA: 53 cm/sec RIGHT CAROTID ARTERY: Intimal thickening and bifurcation atherosclerotic plaque formation. No hemodynamically significant right ICA stenosis, velocity elevation, or turbulent flow. Degree of narrowing less than 50%. RIGHT VERTEBRAL ARTERY:  Normal antegrade flow LEFT CAROTID ARTERY: Similar interval thickening and bifurcation atherosclerotic plaque formation. No hemodynamically significant left ICA stenosis, velocity elevation, or turbulent flow. LEFT VERTEBRAL  ARTERY:  Normal antegrade flow IMPRESSION: Bilateral carotid atherosclerosis. Negative for stenosis. Degree of narrowing less than 50% bilaterally by ultrasound criteria. Patent antegrade vertebral flow bilaterally Electronically Signed   By: Jerilynn Mages.  Shick M.D.   On: 05/12/2022 09:43   MR BRAIN WO CONTRAST  Result Date: 05/11/2022 CLINICAL DATA:  Altered mental status EXAM: MRI HEAD WITHOUT CONTRAST TECHNIQUE:  Multiplanar, multiecho pulse sequences of the brain and surrounding structures were obtained without intravenous contrast. COMPARISON:  11/21/2020 FINDINGS: Brain: Acute infarct of the ventral left thalamus. No acute or chronic hemorrhage. There is multifocal hyperintense T2-weighted signal within the white matter. Generalized volume loss. The midline structures are normal. Vascular: Major flow voids are preserved. Skull and upper cervical spine: Normal calvarium and skull base. Visualized upper cervical spine and soft tissues are normal. Sinuses/Orbits:No paranasal sinus fluid levels or advanced mucosal thickening. Small amount of bilateral mastoid fluid. Normal orbits. IMPRESSION: Acute left thalamic infarct.  No hemorrhage or mass effect. Electronically Signed   By: Ulyses Jarred M.D.   On: 05/11/2022 23:56    Scheduled Meds:  aspirin EC  81 mg Oral Daily   clopidogrel  75 mg Oral Daily   enoxaparin (LOVENOX) injection  30 mg Subcutaneous Q24H   rosuvastatin  10 mg Oral Daily   sodium chloride flush  3 mL Intravenous Q12H   Continuous Infusions:  lactated ringers 75 mL/hr at 05/11/22 1645    LOS: 4 days   Tijuana Scheidegger Neva Seat, MD [facility]  If 7PM-7AM, please contact night-coverage  05/13/2022, 11:40 AM

## 2022-05-13 NOTE — Plan of Care (Signed)
  Problem: Pain Managment: Goal: General experience of comfort will improve Outcome: Progressing   Problem: Education: Goal: Knowledge of disease or condition will improve Outcome: Progressing Goal: Knowledge of secondary prevention will improve (MUST DOCUMENT ALL) Outcome: Progressing Goal: Knowledge of patient specific risk factors will improve Elta [place] N/A or DELETE if not current risk factor) Outcome: Progressing   Problem: Ischemic Stroke/TIA Tissue Perfusion: Goal: Complications of ischemic stroke/TIA will be minimized Outcome: Progressing   Problem: Coping: Goal: Will verbalize positive feelings about self Outcome: Progressing Goal: Will identify appropriate support needs Outcome: Progressing   Problem: Self-Care: Goal: Ability to participate in self-care as condition permits will improve Outcome: Progressing Goal: Verbalization of feelings and concerns over difficulty with self-care will improve Outcome: Progressing Goal: Ability to communicate needs accurately will improve Outcome: Progressing   Problem: Nutrition: Goal: Risk of aspiration will decrease Outcome: Progressing Goal: Dietary intake will improve Outcome: Progressing

## 2022-05-13 NOTE — Progress Notes (Signed)
Physical Therapy Treatment Patient Details Name: [name] MRN: [id number] DOB: Aug 10, 1924 Today's Date: 05/13/2022   History of Present Illness presented to ER secondary to fall, AMS; admitted for management of UTI.  Hospital course complicated by period of decreased alertness, decreased functional status; noted with acute CVA (L thalamic infarct)    PT Comments    Pt was long sitting in bed, lethargic but does easily awake and remains alert throughout session. Pt is oriented to self only. Tangential speech noted. Was able to follow simple commands with increased processing time. Poor initiation of movements but once movements are started does participate in desired task. She requires extensive assistance to exit bed. Stood to [employer] with mod assist of one. Ambulated ~ 8 ft with slow, cautious, step to gait. Required constant assistance to progress RW. Chair follow for additional safety. Pt will need 24/7 assistance + continued skilled PT at DC to maximize safety and independence with all ADLs.    Recommendations for follow up therapy are one component of a multi-disciplinary discharge planning process, led by the attending physician.  Recommendations may be updated based on patient status, additional functional criteria and insurance authorization.  Follow Up Recommendations  Skilled nursing-short term rehab (<3 hours/day)     Assistance Recommended at Discharge Frequent or constant Supervision/Assistance  Patient can return home with the following Assistance with cooking/housework;Assistance with feeding;Direct supervision/assist for medications management;Direct supervision/assist for financial management;Help with stairs or ramp for entrance;Assist for transportation;A lot of help with walking and/or transfers;A lot of help with bathing/dressing/bathroom   Equipment Recommendations  Rolling walker (2 wheels);BSC/3in1;Wheelchair (measurements PT)       Precautions / Restrictions  Precautions Precautions: Fall Restrictions Weight Bearing Restrictions: No     Mobility  Bed Mobility Overal bed mobility: Needs Assistance Bed Mobility: Supine to Sit  Supine to sit: Mod assist, Max assist General bed mobility comments: pt needs initition of all movements. Heavy use of drawpad/ chuck to achieve EOB sitting.    Transfers Overall transfer level: Needs assistance Equipment used: Rolling walker (2 wheels) Transfers: Sit to/from Stand Sit to Stand: Mod assist, From elevated surface  General transfer comment: Pt was able to stand from EOB to RW with mod assist of one. Struggles with initiation of all movements but one started, required mod assist of one.    Ambulation/Gait Ambulation/Gait assistance: Min assist, Mod assist Gait Distance (Feet): 8 Feet Assistive device: Rolling walker (2 wheels) Gait Pattern/deviations: Step-to pattern Gait velocity: decreased     General Gait Details: Pt was able to ambulate ~ 8 ft with chair follow for safety. Pt required author to move RW to intitate gait. Slow cautions gait.    Balance Overall balance assessment: Needs assistance Sitting-balance support: Feet supported, No upper extremity supported Sitting balance-Leahy Scale: Poor Sitting balance - Comments: While sitting EOB, pt has R posterior lateral lean with intervention 1 x to prvent falling back into bed. Required between min assist and CGA.   Standing balance support: Bilateral upper extremity supported Standing balance-Leahy Scale: Poor Standing balance comment: needs constant vcs and assistance with maneuvering RW throughout.       Cognition Arousal/Alertness: Awake/alert Behavior During Therapy: Flat affect Overall Cognitive Status: History of cognitive impairments - at baseline    General Comments: Pt was verbal during session however tangential. Was able to follow simple commands with increased time               Pertinent Vitals/Pain Pain  Assessment Pain Assessment:  PAINAD Breathing: normal Negative Vocalization: occasional moan/groan, low speech, negative/disapproving quality Facial Expression: sad, frightened, frown Body Language: relaxed Consolability: no need to console PAINAD Score: 2 Pain Intervention(s): Limited activity within patient's tolerance, Monitored during session, Repositioned     PT Goals (current goals can now be found in the care plan section) Acute Rehab PT Goals Patient Stated Goal: none stated Progress towards PT goals: Progressing toward goals    Frequency    7X/week      PT Plan Current plan remains appropriate;Frequency needs to be updated    Co-evaluation     PT goals addressed during session: Mobility/safety with mobility;Balance;Proper use of DME        AM-PAC PT "6 Clicks" Mobility   Outcome Measure  Help needed turning from your back to your side while in a flat bed without using bedrails?: A Lot Help needed moving from lying on your back to sitting on the side of a flat bed without using bedrails?: A Lot Help needed moving to and from a bed to a chair (including a wheelchair)?: A Lot Help needed standing up from a chair using your arms (e.g., wheelchair or bedside chair)?: A Lot Help needed to walk in hospital room?: A Lot Help needed climbing 3-5 steps with a railing? : Total 6 Click Score: 11    End of Session Equipment Utilized During Treatment: Gait belt Activity Tolerance: Patient tolerated treatment well Patient left: in chair;with call bell/phone within reach;with chair alarm set;with nursing/sitter in room Nurse Communication: Mobility status PT Visit Diagnosis: Muscle weakness (generalized) (M62.81);Difficulty in walking, not elsewhere classified (R26.2)     Time: 4585-9292 PT Time Calculation (min) (ACUTE ONLY): 23 min  Charges:  $Gait Training: 8-22 mins $Therapeutic Activity: 8-22 mins                    Julaine Fusi PTA 05/13/22, 9:08 AM

## 2022-05-13 NOTE — TOC Progression Note (Signed)
Transition of Care [facility]) - Progression Note    Patient Details  Name: [name] MRN: [id number] Date of Birth: [birth year]  Transition of Care [facility]) CM/SW Contact  Gerilyn Pilgrim, LCSW Phone Number: 05/13/2022, 10:01 AM  Clinical Narrative:   Josem Kaufmann started with HTA for [facility].     Expected Discharge Plan: [place] Barriers to Discharge: Continued Medical Work up  Expected Discharge Plan and Services       Living arrangements for the past 2 months: Single Family Home                                       Social Determinants of Health (SDOH) Interventions SDOH Screenings   Food Insecurity: No Food Insecurity (05/08/2022)  Housing: Low Risk  (05/08/2022)  Transportation Needs: No Transportation Needs (05/08/2022)  Utilities: Not At Risk (05/08/2022)  Alcohol Screen: Low Risk  (08/18/2021)  Depression (PHQ2-9): Medium Risk (08/18/2021)  Financial Resource Strain: Low Risk  (05/06/2021)  Physical Activity: Inactive (05/06/2021)  Social Connections: Socially Isolated (05/06/2021)  Stress: No Stress Concern Present (05/06/2021)  Tobacco Use: Low Risk  (05/12/2022)    Readmission Risk Interventions     No data to display

## 2022-05-13 NOTE — Progress Notes (Signed)
Occupational Therapy Treatment Patient Details Name: [name] MRN: [id number] DOB: 05/08/1924 Today's Date: 05/13/2022   History of present illness presented to ER secondary to fall, AMS; admitted for management of UTI.  Hospital course complicated by period of decreased alertness, decreased functional status; noted with acute CVA (L thalamic infarct)   OT comments  Upon entering session, pt sitting up in recliner and agreeable to OT. Pt with R lateral lean in sitting requiring Max A to correct. She required Mod A +1 for sit<>stand. OT noting stool on chuck pad once standing. Pt then actively still having a BM and required Max A for posterior hygiene. Max A required for static standing balance. Pt then completed stand pivot from recliner>EOB with Mod A +2 using RW. Once sitting EOB pt required Max A +1 to return to supine. Pt continues to experience R side inattention, weakness, and impaired coordination. Multimodal cues required for initiation and sequencing of all tasks this date. Pt left in bed with RN and NT present. Pt is making progress toward goal completion. D/C recommendation remains appropriate. OT will continue to follow acutely.    Recommendations for follow up therapy are one component of a multi-disciplinary discharge planning process, led by the attending physician.  Recommendations may be updated based on patient status, additional functional criteria and insurance authorization.    Follow Up Recommendations  Skilled nursing-short term rehab (<3 hours/day)     Assistance Recommended at Discharge Frequent or constant Supervision/Assistance  Patient can return home with the following  Assistance with cooking/housework;Direct supervision/assist for financial management;Assist for transportation;Help with stairs or ramp for entrance;A lot of help with bathing/dressing/bathroom;A lot of help with walking and/or transfers   Equipment Recommendations  Other (comment) (defer to  next venue of care)    Recommendations for Other Services      Precautions / Restrictions Precautions Precautions: Fall Restrictions Weight Bearing Restrictions: No       Mobility Bed Mobility Overal bed mobility: Needs Assistance Bed Mobility: Sit to Supine       Sit to supine: Max assist        Transfers Overall transfer level: Needs assistance Equipment used: Rolling walker (2 wheels) Transfers: Sit to/from Stand Sit to Stand: Mod assist Stand pivot transfers: Mod assist, +2 safety/equipment               Balance Overall balance assessment: Needs assistance Sitting-balance support: Feet supported, No upper extremity supported Sitting balance-Leahy Scale: Poor   Postural control: Right lateral lean Standing balance support: Bilateral upper extremity supported, During functional activity Standing balance-Leahy Scale: Poor Standing balance comment: Max A to maintain static standing balance while assistance provided for posterior hygiene                           ADL either performed or assessed with clinical judgement   ADL Overall ADL's : Needs assistance/impaired                     Lower Body Dressing: Maximal assistance;Sitting/lateral leans       Toileting- Clothing Manipulation and Hygiene: Maximal assistance;Sit to/from stand Toileting - Clothing Manipulation Details (indicate cue type and reason): posterior hygiene            Extremity/Trunk Assessment Upper Extremity Assessment Upper Extremity Assessment: Generalized weakness   Lower Extremity Assessment Lower Extremity Assessment: Generalized weakness        Vision   Additional Comments: R  side inattention (multimodal cues to attend to R side), L sided gaze preference noted   Perception     Praxis      Cognition Arousal/Alertness: Awake/alert Behavior During Therapy: Flat affect Overall Cognitive Status: History of cognitive impairments - at baseline                                  General Comments: Pt demonstrating expressive difficulties during session, able to make out single word phrases intermittently t/o session. Repetition and multimodal cues required in order for pt to follow single step commands.        Exercises      Shoulder Instructions       General Comments      Pertinent Vitals/ Pain       Pain Assessment Pain Assessment: No/denies pain  Home Living                                          Prior Functioning/Environment              Frequency  Min 2X/week        Progress Toward Goals  OT Goals(current goals can now be found in the care plan section)  Progress towards OT goals: Progressing toward goals  Acute Rehab OT Goals Patient Stated Goal: none stated OT Goal Formulation: Patient unable to participate in goal setting Time For Goal Achievement: 05/26/22 Potential to Achieve Goals: [place] Discharge plan remains appropriate;Frequency remains appropriate    Co-evaluation                 AM-PAC OT "6 Clicks" Daily Activity     Outcome Measure   Help from another person eating meals?: A Lot Help from another person taking care of personal grooming?: A Lot Help from another person toileting, which includes using toliet, bedpan, or urinal?: A Lot Help from another person bathing (including washing, rinsing, drying)?: A Lot Help from another person to put on and taking off regular upper body clothing?: A Lot Help from another person to put on and taking off regular lower body clothing?: A Lot 6 Click Score: 12    End of Session Equipment Utilized During Treatment: Gait belt;Rolling walker (2 wheels)  OT Visit Diagnosis: Other abnormalities of gait and mobility (R26.89);Muscle weakness (generalized) (M62.81);Other symptoms and signs involving the nervous system (R29.898)   Activity Tolerance Patient tolerated treatment well   Patient Left in  bed;with call bell/phone within reach;with bed alarm set;Other (comment) (RN and NT present)   Nurse Communication Mobility status        Time: [phone] OT Time Calculation (min): 19 min  Charges: OT General Charges $OT Visit: 1 Visit OT Treatments $Self Care/Home Management : 8-22 mins  [facility] MS, OTR/L ascom [phone]  05/13/22, 6:11 PM

## 2022-05-14 DIAGNOSIS — I639 Cerebral infarction, unspecified: Secondary | ICD-10-CM | POA: Diagnosis not present

## 2022-05-14 MED ORDER — ENALAPRIL MALEATE 10 MG PO TABS
5.0000 mg | ORAL_TABLET | Freq: Every day | ORAL | Status: DC
  Administered 2022-05-14: 5 mg via ORAL
  Filled 2022-05-14: qty 1
  Filled 2022-05-14: qty 0.5

## 2022-05-14 MED ORDER — ROSUVASTATIN CALCIUM 10 MG PO TABS: 10.0000 mg | ORAL_TABLET | Freq: Every day | ORAL | 0 refills | Status: AC

## 2022-05-14 MED ORDER — ASPIRIN 81 MG PO TBEC: 81.0000 mg | DELAYED_RELEASE_TABLET | Freq: Every day | ORAL | 12 refills | Status: DC

## 2022-05-14 MED ORDER — CLOPIDOGREL BISULFATE 75 MG PO TABS: 75.0000 mg | ORAL_TABLET | Freq: Every day | ORAL | 0 refills | Status: AC

## 2022-05-14 NOTE — TOC Transition Note (Signed)
Transition of Care [facility]) - CM/SW Discharge Note   Patient Details  Name: [name] MRN: [id number] Date of Birth: [birth year]  Transition [facility] Care [facility]) CM/SW Contact:  Gerilyn Pilgrim, LCSW Phone Number: 05/14/2022, 12:06 PM   Clinical Narrative:   Pt has orders to discharge to [facility]. [facility] called, pt second in line. RN given number for report. Tammy at [facility] notified. DC summary sent. CSW signing off.     Final next level of care: Skilled Nursing Facility Barriers to Discharge: Barriers Resolved   Patient Goals and CMS Choice CMS Medicare.gov Compare Post Acute Care list provided to:: Patient Represenative (must comment) Choice offered to / list presented to : [place] / [place]  Discharge Placement                Patient chooses bed at: [facility] [place] Patient to be transferred to facility by: [facility]   Patient and family notified of of transfer: 05/14/22  Discharge Plan and Services Additional resources added to the After Visit Summary for                                       Social Determinants of Health (SDOH) Interventions SDOH Screenings   Food Insecurity: No Food Insecurity (05/08/2022)  Housing: Low Risk  (05/08/2022)  Transportation Needs: No Transportation Needs (05/08/2022)  Utilities: Not At Risk (05/08/2022)  Alcohol Screen: Low Risk  (08/18/2021)  Depression (PHQ2-9): Medium Risk (08/18/2021)  Financial Resource Strain: Low Risk  (05/06/2021)  Physical Activity: Inactive (05/06/2021)  Social Connections: Socially Isolated (05/06/2021)  Stress: No Stress Concern Present (05/06/2021)  Tobacco Use: Low Risk  (05/12/2022)     Readmission Risk Interventions     No data to display

## 2022-05-14 NOTE — TOC Progression Note (Signed)
Transition of Care [facility]) - Progression Note    Patient Details  Name: [name] MRN: [id number] Date of Birth: November 20, 1924  Transition of Care [facility]) CM/SW Contact  Gerilyn Pilgrim, LCSW Phone Number: 05/14/2022, 9:19 AM  Clinical Narrative:    Pt approved for SNF for [facility].  Auth # [id number] EMS: [phone]     Expected Discharge Plan: [place] Barriers to Discharge: Continued Medical Work up  Expected Discharge Plan and Services       Living arrangements for the past 2 months: Single Family Home                                       Social Determinants of Health (SDOH) Interventions SDOH Screenings   Food Insecurity: No Food Insecurity (05/08/2022)  Housing: Low Risk  (05/08/2022)  Transportation Needs: No Transportation Needs (05/08/2022)  Utilities: Not At Risk (05/08/2022)  Alcohol Screen: Low Risk  (08/18/2021)  Depression (PHQ2-9): Medium Risk (08/18/2021)  Financial Resource Strain: Low Risk  (05/06/2021)  Physical Activity: Inactive (05/06/2021)  Social Connections: Socially Isolated (05/06/2021)  Stress: No Stress Concern Present (05/06/2021)  Tobacco Use: Low Risk  (05/12/2022)    Readmission Risk Interventions     No data to display

## 2022-05-14 NOTE — Plan of Care (Signed)
Patient is adequate for discharge to a skilled nursing facility.  PIV removed, report called to [facility].

## 2022-05-14 NOTE — Discharge Summary (Signed)
Discharge Summary  [name]:[id number] DOB: 12/20/1924  PCP: Mikey Kirschner, PA-C  Admit date: 05/08/2022 Discharge date: 05/14/2022  Recommendations for Outpatient Follow-up:  Please follow up with your PCP with CBC and BMP in 1-2 weeks Follow-up with outpatient neurologist as indicated below, in the next month.  Discharge Diagnoses:  Active Hospital Problems   Diagnosis Date Noted   CVA (cerebral vascular accident) ([place]) 74/03/8785   Acute metabolic encephalopathy 76/72/0947   AMS (altered mental status) 05/08/2022   Dementia without behavioral disturbance ([place]) 05/08/2022   Lower abdominal pain 05/08/2022   Urinary tract infection 08/21/2015   Primary hypertension 10/16/2014    Resolved Hospital Problems  No resolved problems to display.   Discharge Condition: Stable   Diet recommendation: Diet Orders (From admission, onward)     Start     Ordered   05/12/22 1605  DIET DYS 3 Room service appropriate? Yes with Assist; Fluid consistency: Nectar Thick  Diet effective now       Comments: Meats MINCED w/ gravies added. NO STRAWS!!!! Yogurts, puddings.  Question Answer Comment  Room service appropriate? Yes with Assist   Fluid consistency: Nectar Thick      05/12/22 1606           HPI and Brief Hospital Course:  This is a pleasant 89 year old Caucasian female with a history significant for dementia.  Prior to the day of admission on 1/26, she was found on the floor by home health aide.  Initially patient was admitted to the hospitalist service with presumption of possible UTI as the cause of her presentation.  Since she did not seem to improve and continued to be encephalopathic, MRI brain was obtained on 1/29 which showed left thalamic infarction.  Neurology was consulted.  He was started on low-dose statin, and dual antiplatelet therapy.  Patient has remained stable, and she is being discharged to subacute nursing facility this morning.  Plan of care as well as  results of all diagnostic studies, and therapeutic plan for dual antiplatelet therapy was discussed over the phone with the patient's granddaughter and medical power of attorney Robin on 1/31.  All questions were answered to her satisfaction.  Procedures: As above  Consultations: Inpatient neurology  Discharge details, plan of care and follow up instructions were discussed with patient and any available family or care providers. Patient and family are in agreement with discharge from the hospital today and all questions were answered to their satisfaction.  Discharge Exam: BP (!) 183/67 (BP Location: Left Arm)   Pulse 68   Temp (!) 97.4 F (36.3 C)   Resp 18   Ht 5\' 2"  (1.575 m)   Wt 54.8 kg   SpO2 98%   BMI 22.11 kg/m  General: Resting comfortably, somewhat somnolent, elderly female appearing her stated age Eyes: EOMI, clear sclerea Neck: supple, no masses, trachea mildline  Cardiovascular: RRR, no murmurs or rubs, no peripheral edema  Respiratory: clear to auscultation bilaterally, no wheezes, no crackles  Abdomen: soft, nontender, nondistended, normal bowel tones heard  Skin: dry, no rashes  Musculoskeletal: no joint effusions, normal range of motion  Neurologic: extraocular muscles intact, slow but clear speech, moving all extremities with intact sensorium   Discharge Instructions You were cared for by a hospitalist during your hospital stay. If you have any questions about your discharge medications or the care you received while you were in the hospital after you are discharged, you can call the unit and asked to speak with  the hospitalist on call if the hospitalist that took care of you is not available. Once you are discharged, your primary care physician will handle any further medical issues. Please note that NO REFILLS for any discharge medications will be authorized once you are discharged, as it is imperative that you return to your primary care physician (or establish a  relationship with a primary care physician if you do not have one) for your aftercare needs so that they can reassess your need for medications and monitor your lab values.   Allergies as of 05/14/2022       Reactions   Bactrim [sulfamethoxazole-trimethoprim] Nausea And Vomiting   Metronidazole Other (See Comments), Hives   Reaction: unknown Reaction: unknown   Nitrofurantoin Hives   GI upset   Nitrofurantoin Monohyd Macro Other (See Comments)   GI upset   Penicillins Diarrhea, Other (See Comments), Hives   Has patient had a PCN reaction causing immediate rash, facial/tongue/throat swelling, SOB or lightheadedness with hypotension: Unknown Has patient had a PCN reaction causing severe rash involving mucus membranes or skin necrosis: Unknown Has patient had a PCN reaction that required hospitalization: Unknown Has patient had a PCN reaction occurring within the last 10 years: Unknown If all of the above answers are "NO", then may proceed with Cephalosporin use. Has patient had a PCN reaction causing immediate rash, facial/tongue/throat swelling, SOB or lightheadedness with hypotension: Unknown Has patient had a PCN reaction causing severe rash involving mucus membranes or skin necrosis: Unknown Has patient had a PCN reaction that required hospitalization: Unknown Has patient had a PCN reaction occurring within the last 10 years: Unknown If all of the above answers are "NO", then may proceed with Cephalosporin use.   Zafirlukast Other (See Comments), Hives   Reaction: unknown Reaction: unknown   Keflex [cephalexin] Rash        Medication List     STOP taking these medications    torsemide 20 MG tablet Commonly known as: DEMADEX       TAKE these medications    acetaminophen 500 MG tablet Commonly known as: TYLENOL Take 500 mg by mouth every 6 (six) hours as needed for mild pain or moderate pain. What changed: Another medication with the same name was removed. Continue  taking this medication, and follow the directions you see here.   aspirin EC 81 MG tablet Take 1 tablet (81 mg total) by mouth daily. Swallow whole.   clopidogrel 75 MG tablet Commonly known as: PLAVIX Take 1 tablet (75 mg total) by mouth daily for 21 days.   enalapril 10 MG tablet Commonly known as: VASOTEC TAKE 1 TABLET (10 MG TOTAL) BY MOUTH DAILY. PLEASE SCHEDULE AN OFFICE VISIT BEFORE ANYMORE REFILLS.   metoprolol tartrate 25 MG tablet Commonly known as: LOPRESSOR Take 0.5 tablets (12.5 mg total) by mouth 2 (two) times daily.   rosuvastatin 10 MG tablet Commonly known as: CRESTOR Take 1 tablet (10 mg total) by mouth daily.       Allergies  Allergen Reactions   Bactrim [Sulfamethoxazole-Trimethoprim] Nausea And Vomiting   Metronidazole Other (See Comments) and Hives    Reaction: unknown Reaction: unknown   Nitrofurantoin Hives    GI upset   Nitrofurantoin Monohyd Macro Other (See Comments)    GI upset   Penicillins Diarrhea, Other (See Comments) and Hives    Has patient had a PCN reaction causing immediate rash, facial/tongue/throat swelling, SOB or lightheadedness with hypotension: Unknown Has patient had a PCN reaction causing severe rash  involving mucus membranes or skin necrosis: Unknown Has patient had a PCN reaction that required hospitalization: Unknown Has patient had a PCN reaction occurring within the last 10 years: Unknown If all of the above answers are "NO", then may proceed with Cephalosporin use. Has patient had a PCN reaction causing immediate rash, facial/tongue/throat swelling, SOB or lightheadedness with hypotension: Unknown Has patient had a PCN reaction causing severe rash involving mucus membranes or skin necrosis: Unknown Has patient had a PCN reaction that required hospitalization: Unknown Has patient had a PCN reaction occurring within the last 10 years: Unknown If all of the above answers are "NO", then may proceed with Cephalosporin use.    Zafirlukast Other (See Comments) and Hives    Reaction: unknown Reaction: unknown   Keflex [Cephalexin] Rash    Contact information for follow-up providers     [facility] Follow up in 4 week(s).          [facility] .               Contact information for after-discharge care     Destination     [facility] Preferred SNF .   Service: Skilled Nursing Contact information: [address] [place] [phone]                     The results of significant diagnostics from this hospitalization (including imaging, microbiology, ancillary and laboratory) are listed below for reference.    Significant Diagnostic Studies: ECHOCARDIOGRAM COMPLETE  Result Date: 05/12/2022    ECHOCARDIOGRAM REPORT   Patient Name:   KITRINA MAURIN Date of Exam: 05/12/2022 Medical Rec #:  [id number]     Height:       62.0 in Accession #:    [id number]    Weight:       120.9 lb Date of Birth:  1924-10-11    BSA:          1.544 m Patient Age:    89 years      BP:           148/62 mmHg Patient Gender: F             HR:           64 bpm. Exam Location:  [facility] Procedure: 2D Echo, Cardiac Doppler and Color Doppler Indications:     Stroke  History:         Patient has prior history of Echocardiogram examinations, most                  recent 11/22/2020. CHF, Stroke, Signs/Symptoms:Altered Mental                  Status; Risk Factors:Hypertension.  Sonographer:     Wenda Low Referring Phys:  [id number] Cornelius Moras XU Diagnosing Phys: Nelva Bush MD IMPRESSIONS  1. Left ventricular ejection fraction, by estimation, is 65 to 70%. The left ventricle has normal function. Left ventricular endocardial border not optimally defined to evaluate regional wall motion. There is mild left ventricular hypertrophy. Left ventricular diastolic parameters were normal.  2. Right ventricular systolic function is normal. The right ventricular size is normal. Mildly  increased right ventricular wall thickness.  3. The mitral valve was not well visualized. Mild mitral valve regurgitation. No evidence of mitral stenosis.  4. Tricuspid valve regurgitation is mild to moderate.  5. The aortic valve is tricuspid. There is mild calcification of the aortic valve. There  is mild thickening of the aortic valve. Aortic valve regurgitation is mild. Aortic valve sclerosis/calcification is present, without any evidence of aortic stenosis. FINDINGS  Left Ventricle: Left ventricular ejection fraction, by estimation, is 65 to 70%. The left ventricle has normal function. Left ventricular endocardial border not optimally defined to evaluate regional wall motion. The left ventricular internal cavity size was normal in size. There is mild left ventricular hypertrophy. Left ventricular diastolic parameters were normal. Right Ventricle: Pulmonary artery pressure is upper normal to mildly elevated (RVSP 30-35 mmHg plus central venous pressure). The right ventricular size is normal. Mildly increased right ventricular wall thickness. Right ventricular systolic function is normal. Left Atrium: Left atrial size was normal in size. Right Atrium: Right atrial size was normal in size. Pericardium: There is no evidence of pericardial effusion. Mitral Valve: The mitral valve was not well visualized. Mild mitral valve regurgitation. No evidence of mitral valve stenosis. MV peak gradient, 3.5 mmHg. The mean mitral valve gradient is 1.0 mmHg. Tricuspid Valve: The tricuspid valve is not well visualized. Tricuspid valve regurgitation is mild to moderate. Aortic Valve: The aortic valve is tricuspid. There is mild calcification of the aortic valve. There is mild thickening of the aortic valve. Aortic valve regurgitation is mild. Aortic valve sclerosis/calcification is present, without any evidence of aortic stenosis. Aortic valve mean gradient measures 3.0 mmHg. Aortic valve peak gradient measures 6.2 mmHg. Aortic  valve area, by VTI measures 2.79 cm. Pulmonic Valve: The pulmonic valve was not well visualized. Pulmonic valve regurgitation is mild. No evidence of pulmonic stenosis. Aorta: The aortic root is normal in size and structure. Pulmonary Artery: The pulmonary artery is of normal size. Venous: The inferior vena cava was not well visualized. IAS/Shunts: The interatrial septum was not well visualized.  LEFT VENTRICLE PLAX 2D LVIDd:         4.10 cm   Diastology LVIDs:         2.20 cm   LV e' medial:    9.90 cm/s LV PW:         1.00 cm   LV E/e' medial:  8.4 LV IVS:        1.20 cm   LV e' lateral:   10.00 cm/s LVOT diam:     2.00 cm   LV E/e' lateral: 8.3 LV SV:         82 LV SV Index:   53 LVOT Area:     3.14 cm  RIGHT VENTRICLE RV Basal diam:  2.90 cm RV Mid diam:    2.50 cm LEFT ATRIUM             Index        RIGHT ATRIUM           Index LA diam:        3.20 cm 2.07 cm/m   RA Area:     13.70 cm LA Vol (A2C):   53.9 ml 34.92 ml/m  RA Volume:   32.80 ml  21.25 ml/m LA Vol (A4C):   32.2 ml 20.86 ml/m LA Biplane Vol: 44.5 ml 28.83 ml/m  AORTIC VALVE                    PULMONIC VALVE AV Area (Vmax):    2.41 cm     PV Vmax:       1.09 m/s AV Area (Vmean):   2.30 cm     PV Peak grad:  4.8 mmHg AV Area (VTI):  2.79 cm AV Vmax:           125.00 cm/s AV Vmean:          87.400 cm/s AV VTI:            0.294 m AV Peak Grad:      6.2 mmHg AV Mean Grad:      3.0 mmHg LVOT Vmax:         95.80 cm/s LVOT Vmean:        64.000 cm/s LVOT VTI:          0.261 m LVOT/AV VTI ratio: 0.89  AORTA Ao Root diam: 3.10 cm Ao Asc diam:  2.90 cm MITRAL VALVE               TRICUSPID VALVE MV Area (PHT): 2.67 cm    TR Peak grad:   31.6 mmHg MV Area VTI:   2.96 cm    TR Vmax:        281.00 cm/s MV Peak grad:  3.5 mmHg MV Mean grad:  1.0 mmHg    SHUNTS MV Vmax:       0.93 m/s    Systemic VTI:  0.26 m MV Vmean:      49.0 cm/s   Systemic Diam: 2.00 cm MV Decel Time: 284 msec MV E velocity: 82.80 cm/s MV A velocity: 85.40 cm/s MV E/A ratio:   0.97 Harrell Gave End MD Electronically signed by Nelva Bush MD Signature Date/Time: 05/12/2022/4:36:32 PM    Final    MR ANGIO HEAD WO CONTRAST  Result Date: 05/12/2022 CLINICAL DATA:  Stroke follow-up.  History of dementia EXAM: MRA HEAD WITHOUT CONTRAST TECHNIQUE: Angiographic images of the Circle of Willis were acquired using MRA technique without intravenous contrast. COMPARISON:  Brain MRI from yesterday FINDINGS: Anterior circulation: No major branch occlusion, beading, or vascular malformation. Atheromatous changes with greatest involvement in the left ACA where there is A2 segment tandem narrowings. Mild left MCA stenosis. Overall, intracranial atheromatous changes mild for age. Posterior circulation: Vertebral and basilar arteries are diffusely patent. Fetal type bilateral PCA flow with duplicated right posterior communicating artery/PCA. At least moderate right P3 segment narrowing. Negative for aneurysm. Anatomic variants: As above IMPRESSION: No emergent finding. Unremarkable appearance of the proximal left PCA. Intracranial atherosclerosis most notably affecting the right PCA and left ACA. Electronically Signed   By: Jorje Guild M.D.   On: 05/12/2022 10:49   US Carotid Bilateral  Result Date: 05/12/2022 CLINICAL DATA:  Hypertension, stroke EXAM: BILATERAL CAROTID DUPLEX ULTRASOUND TECHNIQUE: Pearline Cables scale imaging, color Doppler and duplex ultrasound were performed of bilateral carotid and vertebral arteries in the neck. COMPARISON:  None Available. FINDINGS: Criteria: Quantification of carotid stenosis is based on velocity parameters that correlate the residual internal carotid diameter with NASCET-based stenosis levels, using the diameter of the distal internal carotid lumen as the denominator for stenosis measurement. The following velocity measurements were obtained: RIGHT ICA: 78/14 cm/sec CCA: 32/9 cm/sec SYSTOLIC ICA/CCA RATIO:  1.0 ECA: 46 cm/sec LEFT ICA: 82/15 cm/sec CCA: 51/88  cm/sec SYSTOLIC ICA/CCA RATIO:  1.5 ECA: 53 cm/sec RIGHT CAROTID ARTERY: Intimal thickening and bifurcation atherosclerotic plaque formation. No hemodynamically significant right ICA stenosis, velocity elevation, or turbulent flow. Degree of narrowing less than 50%. RIGHT VERTEBRAL ARTERY:  Normal antegrade flow LEFT CAROTID ARTERY: Similar interval thickening and bifurcation atherosclerotic plaque formation. No hemodynamically significant left ICA stenosis, velocity elevation, or turbulent flow. LEFT VERTEBRAL ARTERY:  Normal antegrade flow IMPRESSION: Bilateral carotid atherosclerosis. Negative for  stenosis. Degree of narrowing less than 50% bilaterally by ultrasound criteria. Patent antegrade vertebral flow bilaterally Electronically Signed   By: Jerilynn Mages.  Shick M.D.   On: 05/12/2022 09:43   MR BRAIN WO CONTRAST  Result Date: 05/11/2022 CLINICAL DATA:  Altered mental status EXAM: MRI HEAD WITHOUT CONTRAST TECHNIQUE: Multiplanar, multiecho pulse sequences of the brain and surrounding structures were obtained without intravenous contrast. COMPARISON:  11/21/2020 FINDINGS: Brain: Acute infarct of the ventral left thalamus. No acute or chronic hemorrhage. There is multifocal hyperintense T2-weighted signal within the white matter. Generalized volume loss. The midline structures are normal. Vascular: Major flow voids are preserved. Skull and upper cervical spine: Normal calvarium and skull base. Visualized upper cervical spine and soft tissues are normal. Sinuses/Orbits:No paranasal sinus fluid levels or advanced mucosal thickening. Small amount of bilateral mastoid fluid. Normal orbits. IMPRESSION: Acute left thalamic infarct.  No hemorrhage or mass effect. Electronically Signed   By: Ulyses Jarred M.D.   On: 05/11/2022 23:56   CT ABDOMEN PELVIS WO CONTRAST  Result Date: 05/08/2022 CLINICAL DATA:  Unwitnessed fall, dementia EXAM: CT ABDOMEN AND PELVIS WITHOUT CONTRAST TECHNIQUE: Multidetector CT imaging of the  abdomen and pelvis was performed following the standard protocol without IV contrast. RADIATION DOSE REDUCTION: This exam was performed according to the departmental dose-optimization program which includes automated exposure control, adjustment of the mA and/or kV according to patient size and/or use of iterative reconstruction technique. COMPARISON:  03/08/2011 FINDINGS: Lower chest: Mild subpleural reticulation at the lung bases. Hepatobiliary: Unenhanced liver is unremarkable. Status post cholecystectomy. No intrahepatic or extrahepatic ductal dilatation. Pancreas: Coarse parenchymal calcifications in the pancreatic head/uncinate process, likely reflecting sequela of prior/chronic pancreatitis. Associated 13 mm pseudocyst in the uncinate process (series 2/image 33), unchanged from 2012, benign. Spleen: Calcified splenic granulomata. Adrenals/Urinary Tract: Adrenal glands are within normal limits. 18 mm hyperdense lesion in the lateral left upper kidney (series 2/image 33), favoring a benign hemorrhagic cyst, but technically indeterminate. Given the patient's age, no follow-up is recommended. Two nonobstructing left lower pole renal calculi measuring up to 3 mm (series 2/image 40). No hydronephrosis. Bladder is within normal limits. Stomach/Bowel: Stomach is notable for a moderate hiatal hernia. No evidence of bowel obstruction. Appendix is not discretely visualized. Extensive colonic diverticulosis, without evidence of diverticulitis. Vascular/Lymphatic: No evidence of abdominal aortic aneurysm. Atherosclerotic calcifications of the abdominal aorta and branch vessels. No suspicious abdominopelvic lymphadenopathy. Reproductive: Status post hysterectomy.  No adnexal masses. Other: Trace pelvic ascites. No free air. Musculoskeletal: Mild degenerative changes of the visualized thoracolumbar spine. Old left superior and inferior pubic rami fracture deformities. No acute fracture is seen. IMPRESSION: No evidence of  traumatic injury to the abdomen/pelvis. Additional ancillary findings as above. Electronically Signed   By: Julian Hy M.D.   On: 05/08/2022 22:35   CT Cervical Spine Wo Contrast  Result Date: 05/08/2022 CLINICAL DATA:  Neck trauma. EXAM: CT CERVICAL SPINE WITHOUT CONTRAST TECHNIQUE: Multidetector CT imaging of the cervical spine was performed without intravenous contrast. Multiplanar CT image reconstructions were also generated. RADIATION DOSE REDUCTION: This exam was performed according to the departmental dose-optimization program which includes automated exposure control, adjustment of the mA and/or kV according to patient size and/or use of iterative reconstruction technique. COMPARISON:  None Available. FINDINGS: Despite efforts by the technologist and patient, motion artifact is present on today's exam and could not be eliminated. This reduces exam sensitivity and specificity. Alignment: There is 1-2 mm grade 1 anterolisthesis of C3 on C4, 2 mm grade  1 anterolisthesis of C4 on C5, 2 mm grade 1 anterolisthesis of C5 on C6, 2 mm grade 1 anterolisthesis of C7 on T1, and 2 mm grade 1 anterolisthesis of T1 on T2. Mild levocurvature centered at C4. Skull base and vertebrae: Moderate degenerative changes at the atlantodens interval. Vertebral body heights are maintained. Moderate to severe C5-6 and C6-7 disc space narrowing with high-grade C6-7 endplate sclerosis and endplate osteophytes. Soft tissues and spinal canal: No prevertebral fluid or swelling. No visible canal hematoma. Disc levels: Multilevel degenerative disc changes including disc space narrowing, uncovertebral hypertrophy, and facet joint hypertrophy contribute to mild left C2-3, moderate left and mild right C3-4, moderate to severe right C4-5, mild left C5-6, mild to moderate right and mild left C6-7 neuroforaminal stenosis. Upper chest: There is chronic interlobular septal thickening and scarring within the bilateral lung apices. Other: No  cervical chain lymphadenopathy. Moderate atherosclerotic calcifications. IMPRESSION: 1. No acute fracture. 2. Multilevel degenerative disc and joint changes as above. Electronically Signed   By: Yvonne Kendall M.D.   On: 05/08/2022 16:57   CT Head Wo Contrast  Result Date: 05/08/2022 CLINICAL DATA:  Mental status change.  Unknown cause. EXAM: CT HEAD WITHOUT CONTRAST TECHNIQUE: Contiguous axial images were obtained from the base of the skull through the vertex without intravenous contrast. RADIATION DOSE REDUCTION: This exam was performed according to the departmental dose-optimization program which includes automated exposure control, adjustment of the mA and/or kV according to patient size and/or use of iterative reconstruction technique. COMPARISON:  CT brain 07/21/2020 FINDINGS: Brain: There is moderate cortical atrophy, unchanged from prior within normal limits for patient age. The ventricles are normal in configuration. The basilar cisterns are patent. No mass, mass effect, or midline shift. No acute intracranial hemorrhage is seen. No abnormal extra-axial fluid collection. Mild patchy periventricular white matter hypodensities, likely chronic ischemic white matter changes. Preservation of the normal cortical gray-white interface without CT evidence of an acute major vascular territorial cortical based infarction. Vascular: There are high-grade atherosclerotic intracranial calcifications again seen. No hyperdense vessel or unexpected calcification. Skull: Normal. Negative for fracture or focal lesion. Sinuses/Orbits: Status post bilateral lens replacements, partially visualized. The visualized portions of the frontal and ethmoid air cells are clear. The visualized portions of the bilateral mastoid air cells are clear. The visualized paranasal sinuses and mastoid air cells are clear. Other: None. IMPRESSION: 1. No acute intracranial process. 2. Moderate cortical atrophy and mild chronic ischemic white matter  changes. Electronically Signed   By: Yvonne Kendall M.D.   On: 05/08/2022 16:42   DG Chest 1 View  Result Date: 05/08/2022 CLINICAL DATA:  Altered mental status. EXAM: CHEST  1 VIEW COMPARISON:  07/21/2020. FINDINGS: Clear lungs. Stable cardiac and mediastinal contours. No pleural effusion or pneumothorax. Visualized bones and upper abdomen are unremarkable. IMPRESSION: No evidence of acute cardiopulmonary disease. Electronically Signed   By: Emmit Alexanders M.D.   On: 05/08/2022 16:12    Microbiology: Recent Results (from the past 240 hour(s))  Urine Culture (for pregnant, neutropenic or urologic patients or patients with an indwelling urinary catheter)     Status: Abnormal   Collection Time: 05/08/22  6:46 PM   Specimen: Urine, Random  Result Value Ref Range Status   Specimen Description   Final    URINE, RANDOM Performed at [facility], [address] [place], [place] [address]    Special Requests   Final    NONE Performed at [facility], [place],  [place], [place] [address]    Culture >=100,000 COLONIES/mL KLEBSIELLA OXYTOCA (A)  Final   Report Status 05/11/2022 FINAL  Final   Organism ID, Bacteria KLEBSIELLA OXYTOCA (A)  Final      Susceptibility   Klebsiella oxytoca - MIC*    AMPICILLIN RESISTANT Resistant     CEFAZOLIN <=4 SENSITIVE Sensitive     CEFEPIME <=0.12 SENSITIVE Sensitive     CEFTRIAXONE <=0.25 SENSITIVE Sensitive     CIPROFLOXACIN <=0.25 SENSITIVE Sensitive     GENTAMICIN <=1 SENSITIVE Sensitive     IMIPENEM 0.5 SENSITIVE Sensitive     NITROFURANTOIN 64 INTERMEDIATE Intermediate     TRIMETH/SULFA <=20 SENSITIVE Sensitive     AMPICILLIN/SULBACTAM <=2 SENSITIVE Sensitive     PIP/TAZO <=4 SENSITIVE Sensitive     * >=100,000 COLONIES/mL KLEBSIELLA OXYTOCA     Labs: Basic Metabolic Panel: Recent Labs  Lab 05/08/22 1545 05/09/22 0549 05/11/22 1115 05/12/22 0451  NA 139 137 131* 130*  K 3.6 3.7 4.0 3.8  CL 108 107 100 101  CO2  23 24 19* 22  GLUCOSE 96 97 87 113*  BUN 21 18 33* 33*  CREATININE 1.25* 1.12* 1.30* 1.28*  CALCIUM 9.4 8.9 8.8* 8.6*   Liver Function Tests: Recent Labs  Lab 05/08/22 1545  AST 22  ALT 9  ALKPHOS 82  BILITOT 0.7  PROT 6.6  ALBUMIN 3.8   No results for input(s): "LIPASE", "AMYLASE" in the last 168 hours. No results for input(s): "AMMONIA" in the last 168 hours. CBC: Recent Labs  Lab 05/08/22 1545 05/09/22 0549 05/11/22 1115 05/12/22 0451  WBC 6.7 4.9 14.1* 10.0  NEUTROABS 4.6  --  8.8* 7.4  HGB 11.2* 11.4* 12.9 11.1*  HCT 33.7* 34.1* 38.8 33.2*  MCV 96.0 94.7 95.6 95.1  PLT 163 145* 144* 153   Cardiac Enzymes: No results for input(s): "CKTOTAL", "CKMB", "CKMBINDEX", "TROPONINI" in the last 168 hours. BNP: BNP (last 3 results) No results for input(s): "BNP" in the last 8760 hours.  ProBNP (last 3 results) No results for input(s): "PROBNP" in the last 8760 hours.  CBG: Recent Labs  Lab 05/08/22 1551 05/09/22 1240  GLUCAP 104* 183*    Time spent: > 30 minutes were spent in preparing this discharge including medication reconciliation, counseling, and coordination of care.  Signed:  Tracyann Duffell Neva Seat, MD  [facility] 05/14/2022, 11:10 AM

## 2022-05-14 NOTE — Progress Notes (Signed)
SLP Cancellation Note  Patient Details Name: [name] MRN: [id number] DOB: Apr 05, 1925   Cancelled treatment:       Reason Eval/Treat Not Completed:  (chart reviewed; upon entering room, EMS was present to transport pt to SNF.) Per chart/NSG, pt has orders for the recommended Dysphagia diet w/ aspiration precautions and for ST services upon admitting to [facility], SNF.  Recommend f/u w/ objective swallow study when appropriate.      Orinda Kenner, MS, CCC-SLP Speech Language Pathologist Rehab Services; [place] [phone] (ascom) Garald Rhew 05/14/2022, 2:19 PM

## 2022-05-15 ENCOUNTER — Other Ambulatory Visit: Payer: Self-pay

## 2022-05-15 ENCOUNTER — Emergency Department: Payer: PPO

## 2022-05-15 ENCOUNTER — Emergency Department
Admission: EM | Admit: 2022-05-15 | Discharge: 2022-05-15 | Disposition: A | Discharge status: Home / Self Care | Payer: PPO | Attending: Emergency Medicine | Admitting: Emergency Medicine

## 2022-05-15 DIAGNOSIS — R4182 Altered mental status, unspecified: Secondary | ICD-10-CM | POA: Diagnosis present

## 2022-05-15 DIAGNOSIS — J189 Pneumonia, unspecified organism: Secondary | ICD-10-CM

## 2022-05-15 DIAGNOSIS — J180 Bronchopneumonia, unspecified organism: Secondary | ICD-10-CM | POA: Insufficient documentation

## 2022-05-15 DIAGNOSIS — I808 Phlebitis and thrombophlebitis of other sites: Secondary | ICD-10-CM | POA: Diagnosis not present

## 2022-05-15 DIAGNOSIS — Z20822 Contact with and (suspected) exposure to covid-19: Secondary | ICD-10-CM | POA: Insufficient documentation

## 2022-05-15 DIAGNOSIS — I10 Essential (primary) hypertension: Secondary | ICD-10-CM | POA: Insufficient documentation

## 2022-05-15 LAB — COMPREHENSIVE METABOLIC PANEL
ALT: 153 U/L — ABNORMAL HIGH (ref 0–44)
AST: 233 U/L — ABNORMAL HIGH (ref 15–41)
Albumin: 2.7 g/dL — ABNORMAL LOW (ref 3.5–5.0)
Alkaline Phosphatase: 87 U/L (ref 38–126)
Anion gap: 4 — ABNORMAL LOW (ref 5–15)
BUN: 36 mg/dL — ABNORMAL HIGH (ref 8–23)
CO2: 26 mmol/L (ref 22–32)
Calcium: 9 mg/dL (ref 8.9–10.3)
Chloride: 103 mmol/L (ref 98–111)
Creatinine, Ser: 1.19 mg/dL — ABNORMAL HIGH (ref 0.44–1.00)
GFR, Estimated: 42 mL/min — ABNORMAL LOW (ref >60)
Glucose, Bld: 105 mg/dL — ABNORMAL HIGH (ref 70–99)
Potassium: 3.7 mmol/L (ref 3.5–5.1)
Sodium: 133 mmol/L — ABNORMAL LOW (ref 135–145)
Total Bilirubin: 0.5 mg/dL (ref 0.3–1.2)
Total Protein: 6.2 g/dL — ABNORMAL LOW (ref 6.5–8.1)

## 2022-05-15 LAB — CBC WITH DIFFERENTIAL/PLATELET
Abs Immature Granulocytes: 0.05 10*3/uL (ref 0.00–0.07)
Basophils Absolute: 0 10*3/uL (ref 0.0–0.1)
Basophils Relative: 0 %
Eosinophils Absolute: 0.1 10*3/uL (ref 0.0–0.5)
Eosinophils Relative: 1 %
HCT: 34.2 % — ABNORMAL LOW (ref 36.0–46.0)
Hemoglobin: 11.2 g/dL — ABNORMAL LOW (ref 12.0–15.0)
Immature Granulocytes: 1 %
Lymphocytes Relative: 13 %
Lymphs Abs: 1.3 10*3/uL (ref 0.7–4.0)
MCH: 31.5 pg (ref 26.0–34.0)
MCHC: 32.7 g/dL (ref 30.0–36.0)
MCV: 96.3 fL (ref 80.0–100.0)
Monocytes Absolute: 1 10*3/uL (ref 0.1–1.0)
Monocytes Relative: 10 %
Neutro Abs: 7 10*3/uL (ref 1.7–7.7)
Neutrophils Relative %: 75 %
Platelets: 264 10*3/uL (ref 150–400)
RBC: 3.55 MIL/uL — ABNORMAL LOW (ref 3.87–5.11)
RDW: 12.8 % (ref 11.5–15.5)
WBC: 9.4 10*3/uL (ref 4.0–10.5)
nRBC: 0 % (ref 0.0–0.2)

## 2022-05-15 LAB — RESP PANEL BY RT-PCR (RSV, FLU A&B, COVID)  RVPGX2
Influenza A by PCR: NEGATIVE
Influenza B by PCR: NEGATIVE
Resp Syncytial Virus by PCR: NEGATIVE
SARS Coronavirus 2 by RT PCR: NEGATIVE

## 2022-05-15 LAB — TSH: TSH: 2.828 u[IU]/mL (ref 0.350–4.500)

## 2022-05-15 LAB — PHOSPHORUS: Phosphorus: 3.1 mg/dL (ref 2.5–4.6)

## 2022-05-15 LAB — MAGNESIUM: Magnesium: 2 mg/dL (ref 1.7–2.4)

## 2022-05-15 MED ORDER — DOXYCYCLINE CALCIUM 50 MG/5ML PO SYRP: 100.0000 mg | ORAL_SOLUTION | Freq: Two times a day (BID) | ORAL | 0 refills | Status: AC

## 2022-05-15 NOTE — ED Triage Notes (Signed)
Pt arrives from [facility] via EMS for increased AMS as reported by family. EMS reports pt had a CVA Friday and was dcd on comfort care. Pt was LSN at 0900 by staff, family is concerned about pt because "she is talking less" per EMS. Pt arrives with MOST form, Dr Joni Fears to bedside.

## 2022-05-15 NOTE — ED Provider Notes (Signed)
[facility] Provider Note    Event Date/Time   First MD Initiated Contact with Patient 05/15/22 1043     (approximate)   History   Chief Complaint: Altered Mental Status   HPI  [name] is a 89 y.o. female with a history of hypertension and recent stroke who is brought to the ED due to altered mental status today.  Per granddaughter, HCPOA, at bedside, at recent hospital discharge the patient was able to sit upright, feed herself, answer questions, moves both arms.  This morning, she was noted to be not interactive, not talking, not moving.  No falls or interval trauma.  Patient has a MOST form indicating DNR, comfort measures, and agreeable to IV fluids, antibiotics, other medicines for defined periods of time.     Physical Exam   Triage Vital Signs: ED Triage Vitals  Enc Vitals Group     BP 05/15/22 1051 (!) 147/64     Pulse Rate 05/15/22 1051 77     Resp 05/15/22 1051 16     Temp 05/15/22 1051 (!) 97 F (36.1 C)     Temp Source 05/15/22 1051 Axillary     SpO2 05/15/22 1049 95 %     Weight 05/15/22 1052 120 lb 14.4 oz (54.8 kg)     Height 05/15/22 1052 5\' 2"  (1.575 m)     Head Circumference --      [facility] --      Pain Score --      Pain Loc --      Pain Edu? --      Excl. in [place]? --     Most recent vital signs: Vitals:   05/15/22 1051 05/15/22 1100  BP: (!) 147/64 (!) 147/69  Pulse: 77 76  Resp: 16 18  Temp: 97.7 F (36.5 C)   SpO2: 95% 98%    General: Awake, no distress.  Not oriented. CV:  Good peripheral perfusion.  Regular rate and rhythm.  Normal distal pulses Resp:  Normal effort.  Right lung clear to auscultation.  Left lung with diminished breath sounds in the left lower lung Abd:  No distention.  Soft nontender Other:  PERRL, EOMI, no signs of head trauma.  There is edema of the right forearm with hazy erythema centered around peripheral IV site from recent hospitalization.  Consistent with superficial  phlebitis   ED Results / Procedures / Treatments   Labs (all labs ordered are listed, but only abnormal results are displayed) Labs Reviewed  COMPREHENSIVE METABOLIC PANEL - Abnormal; Notable for the following components:      Result Value   Sodium 133 (*)    Glucose, Bld 105 (*)    BUN 36 (*)    Creatinine, Ser 1.19 (*)    Total Protein 6.2 (*)    Albumin 2.7 (*)    AST 233 (*)    ALT 153 (*)    GFR, Estimated 42 (*)    Anion gap 4 (*)    All other components within normal limits  CBC WITH DIFFERENTIAL/PLATELET - Abnormal; Notable for the following components:   RBC 3.55 (*)    Hemoglobin 11.2 (*)    HCT 34.2 (*)    All other components within normal limits  RESP PANEL BY RT-PCR (RSV, FLU A&B, COVID)  RVPGX2  MAGNESIUM  PHOSPHORUS  TSH     EKG Interpreted by me Sinus rhythm rate of 78.  Left axis, normal intervals.  Normal QRS ST  segments and T waves.   RADIOLOGY Chest x-ray interpreted by me, shows consolidation of the left lower lung.  Radiology report reviewed.   PROCEDURES:  Procedures   MEDICATIONS ORDERED IN ED: Medications - No data to display   IMPRESSION / MDM / [place] / ED COURSE  I reviewed the triage vital signs and the nursing notes.  DDx: Dehydration, AKI, electrolyte abnormality, stroke, intracranial hemorrhage, pneumonia, viral illness  Patient's presentation is most consistent with acute presentation with potential threat to life or bodily function.  Patient presents with altered mental status, minimally interactive.  Not able to participate in neurologic exam.  I had an extensive discussion with her HCPOA, granddaughter [name] at bedside regarding goals of care.  She would like to proceed with ED workup to help clarify whether the patient's decline today is evolution of her chronic condition versus acute illness that may be treated.  Wants to defer neuroimaging for now pending chest x-ray and  labs.   ----------------------------------------- 12:56 PM on 05/15/2022 ----------------------------------------- Chest x-ray shows left basilar pneumonia.  Labs are okay.  Patient has been able to wake up, interact with her granddaughter and squeeze hands.  She feels like her grandmother is at neurologic baseline, just very low energy.  She is comfortable deferring neuroimaging at this time.  Agrees with treating pneumonia, can be discharged back to [facility].  Allergy profile is most likely due to side effects and not hypersensitivity, but doxycycline would be an acceptable choice.      FINAL CLINICAL IMPRESSION(S) / ED DIAGNOSES   Final diagnoses:  Pneumonia of left lower lobe due to infectious organism     Rx / DC Orders   ED Discharge Orders          Ordered    doxycycline (VIBRAMYCIN) 50 MG/5ML SYRP  2 times daily        05/15/22 1256             Note:  This document was prepared using Dragon voice recognition software and may include unintentional dictation errors.   Carrie Mew, MD 05/15/22 1258

## 2022-05-15 NOTE — ED Notes (Signed)
Handoff attempted again at this time.  No answer

## 2022-05-18 ENCOUNTER — Telehealth: Payer: Self-pay

## 2022-05-18 NOTE — Telephone Encounter (Signed)
Copied from [place] [phone]. Topic: General - Inquiry >> May 18, 2022 11:46 AM Erskine Squibb wrote: Reason for CRM: The granddaughter of the patient called in stating she needed to cancel her appt as she has been in and out of the hospital due to a stroke. She is currently in rehab at [facility] in [place]. She is now starting to discuss hospice care and wants to know who she specifically needs to talk to about that. She is going to talk with the social worker at [facility] for now. Please assist patient further.

## 2022-05-18 NOTE — Telephone Encounter (Signed)
Appt cancelled // fyi to [place]

## 2022-05-19 NOTE — Telephone Encounter (Signed)
Noted, ty, I've been following her hospital course If pt needs additional social work resources to let us know

## 2022-05-21 ENCOUNTER — Encounter: Payer: PPO | Admitting: Physician Assistant

## 2022-05-26 ENCOUNTER — Other Ambulatory Visit: Payer: Self-pay | Admitting: Family Medicine

## 2022-05-26 DIAGNOSIS — R935 Abnormal findings on diagnostic imaging of other abdominal regions, including retroperitoneum: Secondary | ICD-10-CM

## 2022-05-26 DIAGNOSIS — R7989 Other specified abnormal findings of blood chemistry: Secondary | ICD-10-CM

## 2022-05-28 ENCOUNTER — Ambulatory Visit
Admission: RE | Admit: 2022-05-28 | Discharge: 2022-05-28 | Disposition: A | Discharge status: Home / Self Care | Payer: PPO | Source: Ambulatory Visit | Attending: Family Medicine | Admitting: Family Medicine

## 2022-05-28 DIAGNOSIS — R935 Abnormal findings on diagnostic imaging of other abdominal regions, including retroperitoneum: Secondary | ICD-10-CM

## 2022-05-28 DIAGNOSIS — R7989 Other specified abnormal findings of blood chemistry: Secondary | ICD-10-CM

## 2022-06-02 ENCOUNTER — Encounter: Payer: Self-pay | Admitting: Nurse Practitioner

## 2022-06-02 ENCOUNTER — Non-Acute Institutional Stay: Payer: PPO | Admitting: Nurse Practitioner

## 2022-06-02 DIAGNOSIS — F1027 Alcohol dependence with alcohol-induced persisting dementia: Secondary | ICD-10-CM

## 2022-06-02 DIAGNOSIS — I639 Cerebral infarction, unspecified: Secondary | ICD-10-CM

## 2022-06-02 DIAGNOSIS — R5381 Other malaise: Secondary | ICD-10-CM

## 2022-06-02 DIAGNOSIS — Z515 Encounter for palliative care: Secondary | ICD-10-CM

## 2022-06-02 NOTE — Progress Notes (Signed)
[facility] Palliative Care Consult Note Telephone: [phone]  Fax: [phone]   Date of encounter: 06/02/22 5:31 PM PATIENT NAME: [name] [id number] [place] [id number]   [phone] (home)  DOB: April 21, 1924 MRN: [id number] PRIMARY CARE PROVIDER:    Emelia [name],  [address] [place] [address] [phone]  REFERRING PROVIDER:  [facility]; STR [name] Kirschner, PA-C [address] [place],  [place] [address] [phone]  RESPONSIBLE PARTY:    Contact Information     Name Relation Home Work Mobile   [name],Myra "[name]" Granddaughter   [phone]   [name],[name] Granddaughter   [phone]      I met face to face with patient in facility. Palliative Care was asked to follow this patient by consultation request of  [name] Kirschner, PA-C/[facility] STR to address advance care planning and complex medical decision making. This is the initial visit.                                ASSESSMENT AND PLAN / RECOMMENDATIONS:  Symptom Management/Plan: 1. Advance Care Planning;  ongoing discussions 2. Goals of Care: Goals include to maximize quality of life and symptom management. Our advance care planning conversation included a discussion about:    The value and importance of advance care planning  Exploration of personal, cultural or spiritual beliefs that might influence medical decisions  Exploration of goals of care in the event of a sudden injury or illness  Identification and preparation of a healthcare agent  Review and updating or creation of an advance directive document. 3. Palliative care encounter; Palliative care encounter; Palliative medicine team will continue to support patient, patient's family, and medical team. Visit consisted of counseling and education dealing with the complex and emotionally intense issues of symptom management and palliative care in the setting  of serious and potentially life-threatening illness  4. Debility secondary to CVA, dementia with deconditioning; ongoing decline currently at STR with slow progression, decreased appetite, reviewed weights, continue to monitor progress, will need further discussion with family for goc  Follow up Palliative Care Visit: PC f/u visit further discussion monitor trends of appetite, weights, monitor for functional, cognitive decline with chronic disease progression, assess any active symptoms, supportive role. Palliative care will continue to follow for complex medical decision making, advance care planning, and clarification of goals. Return 1 to 2 weeks or prn.  I spent 46 minutes providing this consultation starting at 9:15 am. More than 50% of the time in this consultation was spent in counseling and care coordination. PPS: 30%  Chief Complaint: Initial palliative consult for complex medical decision making, address goals, manage ongoing symptoms  HISTORY OF PRESENT ILLNESS:  [name] is a 89 y.o. year old female  with multiple medical problems including CVA, dementia, HTN. 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 Neurology consulted starting antiplatelet therapy with statin, remained stable and d/c to STR at [facility] were she currently resides. [name] per staff requires assistance for all adl's, transfers, feeding. Purpose of today PC f/u visit further discussion monitor trends of appetite, weights, monitor for functional, cognitive decline with chronic disease progression, assess any active symptoms, supportive role. At present [name] is lying in bed, makes eye contact, mumbles sounds, unable  to identify words. [name] was cooperative with assessment, appears comfortable. Medications, poc, goc reviewed with hospital records. Attempted to contact Laureen Abrahams  granddaughter for further discussions, will continue current poc at this time. Updated staff.   History obtained from review of EMR, discussion with primary team, and interview with family, facility staff/caregiver and/or [name].  I reviewed available labs, medications, imaging, studies and related documents from the EMR.  Records reviewed and summarized above.  Physical Exam: Constitutional: NAD General: frail appearing, elderly female ENMT: oral mucous membranes moist CV: S1S2, RRR Pulmonary: LCTA Abdomen: normo-active BS + 4 quadrants, soft and non tender Skin: warm and dry Neuro:  + generalized weakness,  + cognitive impairment Psych: flat affect, Alert, confused CURRENT PROBLEM LIST:  Patient Active Problem List   Diagnosis Date Noted   CVA (cerebral vascular accident) ([place]) [id number]   Acute metabolic encephalopathy [id number]   AMS (altered mental status) 05/08/2022   Dementia without behavioral disturbance ([place]) 05/08/2022   Lower abdominal pain 05/08/2022   Change in consistency of stool 09/25/2021   Hypertensive heart and kidney disease with HF and with CKD stage III ([place]) 09/18/2021   Anemia 09/18/2021   Incontinence of feces 09/18/2021   Acute renal failure superimposed on stage 4 chronic kidney disease ([place]) 11/22/2020   Chronic respiratory failure with hypoxia ([place]) 07/29/2020   Loss of memory 07/29/2020   History of depression 07/25/2020   Closed fracture of left superior pubic ramus ([place]) 07/21/2020   Closed pelvic fracture ([place]) 07/21/2020   Chronic diastolic CHF (congestive heart failure) ([place]) 07/21/2020   Fall at home, initial encounter 07/21/2020   CKD (chronic kidney disease), stage IIIa 07/21/2020   Elevated CK 07/21/2020   Syncope 08/17/2017   Arthritis of both hands 01/14/2016   Urinary tract infection 08/21/2015   Anxiety 10/17/2014   Allergic rhinitis 10/16/2014   Airway hyperreactivity 10/16/2014   Biliary calculi, common bile duct 10/16/2014    Clinical depression 10/16/2014   Acquired clavicle deformity [id number]   Clicking shoulder [id number]   Primary hypertension 10/16/2014   H/O gastrointestinal hemorrhage 10/16/2014   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   Insomnia 10/16/2014   Age-related osteoporosis with current pathological fracture with routine healing 10/16/2014   Cyst of pancreas 10/16/2014   Temporary cerebral vascular dysfunction 10/16/2014   Other constipation 03/07/2014   Right groin pain 03/07/2014   Internal hemorrhoid 03/07/2014   Gallstones, common bile duct 08/29/2013   Cholelithiasis 08/29/2013   CYST AND PSEUDOCYST OF PANCREAS 03/12/2008   PAST MEDICAL HISTORY:  Active Ambulatory Problems    Diagnosis Date Noted   CYST AND PSEUDOCYST OF PANCREAS 03/12/2008   Gallstones, common bile duct 08/29/2013   Cholelithiasis 08/29/2013   Other constipation 03/07/2014   Right groin pain 03/07/2014   Internal hemorrhoid 03/07/2014   Allergic rhinitis 10/16/2014   Airway hyperreactivity 10/16/2014   Biliary calculi, common bile duct 10/16/2014   Clinical depression 10/16/2014   Acquired clavicle deformity 123XX123   Clicking shoulder [id number]   Primary hypertension 10/16/2014   H/O gastrointestinal hemorrhage 10/16/2014   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   Insomnia 10/16/2014   Age-related osteoporosis with current pathological fracture with routine healing 10/16/2014   Cyst of pancreas 10/16/2014   Temporary cerebral vascular dysfunction 10/16/2014   Anxiety 10/17/2014   Urinary tract infection 08/21/2015   Arthritis of both hands 01/14/2016   Syncope 08/17/2017   Closed fracture of left superior pubic ramus ([place]) 07/21/2020   Closed pelvic fracture ([place]) 07/21/2020   Chronic  diastolic CHF (congestive heart failure) ([place]) 07/21/2020   Fall at home, initial encounter 07/21/2020   CKD (chronic kidney disease), stage IIIa 07/21/2020   Elevated CK 07/21/2020   Acute renal failure superimposed on  stage 4 chronic kidney disease ([place]) 11/22/2020   Chronic respiratory failure with hypoxia ([place]) 07/29/2020   History of depression 07/25/2020   Loss of memory 07/29/2020   Hypertensive heart and kidney disease with HF and with CKD stage III ([place]) 09/18/2021   Anemia 09/18/2021   Incontinence of feces 09/18/2021   Change in consistency of stool 09/25/2021   AMS (altered mental status) 05/08/2022   Dementia without behavioral disturbance ([place]) 05/08/2022   Lower abdominal pain [id number]   Acute metabolic encephalopathy [id number]   CVA (cerebral vascular accident) ([place]) 05/12/2022   Resolved Ambulatory Problems    Diagnosis Date Noted   Below normal amount of sodium in the blood 10/16/2014   Bad memory 10/16/2014   Foot pain 12/31/2014   Need for vaccination with 13-polyvalent pneumococcal conjugate vaccine 03/13/2015   Near syncope 07/21/2015   Scalp laceration 07/29/2015   Fall 07/29/2015   Acute UTI 08/21/2015   Hyponatremia 01/11/2017   Past Medical History:  Diagnosis Date   Breast cyst    Diverticulitis    Gallstones    Hypertension    Low sodium levels    SOCIAL HX:  Social History   Tobacco Use   Smoking status: Never   Smokeless tobacco: Never  Substance Use Topics   Alcohol use: No   FAMILY HX:  Family History  Problem Relation Age of Onset   Hypertension Father    CAD Father    Cancer Brother        unknown type      ALLERGIES:  Allergies  Allergen Reactions   Bactrim [Sulfamethoxazole-Trimethoprim] Nausea And Vomiting   Metronidazole Other (See Comments) and Hives    Reaction: unknown Reaction: unknown   Nitrofurantoin Hives    GI upset   Nitrofurantoin Monohyd Macro Other (See Comments)    GI upset   Penicillins Diarrhea, Other (See Comments) and Hives    Has patient had a PCN reaction causing immediate rash, facial/tongue/throat swelling, SOB or lightheadedness with hypotension: Unknown Has patient had a PCN reaction causing severe rash  involving mucus membranes or skin necrosis: Unknown Has patient had a PCN reaction that required hospitalization: Unknown Has patient had a PCN reaction occurring within the last 10 years: Unknown If all of the above answers are "NO", then may proceed with Cephalosporin use. Has patient had a PCN reaction causing immediate rash, facial/tongue/throat swelling, SOB or lightheadedness with hypotension: Unknown Has patient had a PCN reaction causing severe rash involving mucus membranes or skin necrosis: Unknown Has patient had a PCN reaction that required hospitalization: Unknown Has patient had a PCN reaction occurring within the last 10 years: Unknown If all of the above answers are "NO", then may proceed with Cephalosporin use.   Zafirlukast Other (See Comments) and Hives    Reaction: unknown Reaction: unknown   Keflex [Cephalexin] Rash     PERTINENT MEDICATIONS:  Outpatient Encounter Medications as of 06/02/2022  Medication Sig   acetaminophen (TYLENOL) 500 MG tablet Take 500 mg by mouth every 6 (six) hours as needed for mild pain or moderate pain.   aspirin EC 81 MG tablet Take 1 tablet (81 mg total) by mouth daily. Swallow whole.   clopidogrel (PLAVIX) 75 MG tablet Take 1 tablet (75 mg total) by mouth daily for  21 days.   enalapril (VASOTEC) 10 MG tablet TAKE 1 TABLET (10 MG TOTAL) BY MOUTH DAILY. PLEASE SCHEDULE AN OFFICE VISIT BEFORE ANYMORE REFILLS.   metoprolol tartrate (LOPRESSOR) 25 MG tablet Take 0.5 tablets (12.5 mg total) by mouth 2 (two) times daily.   rosuvastatin (CRESTOR) 10 MG tablet Take 1 tablet (10 mg total) by mouth daily.   No facility-administered encounter medications on file as of 06/02/2022.   Thank you for the opportunity to participate in the care of [name].  The palliative care team will continue to follow. Please call our office at [phone] if we can be of additional assistance.   Tiziana Cislo Z Malachai Schalk, NP ,

## 2022-06-03 ENCOUNTER — Non-Acute Institutional Stay: Payer: PPO | Admitting: Nurse Practitioner

## 2022-06-03 ENCOUNTER — Encounter: Payer: Self-pay | Admitting: Nurse Practitioner

## 2022-06-03 DIAGNOSIS — I639 Cerebral infarction, unspecified: Secondary | ICD-10-CM

## 2022-06-03 DIAGNOSIS — R5381 Other malaise: Secondary | ICD-10-CM

## 2022-06-03 DIAGNOSIS — F1027 Alcohol dependence with alcohol-induced persisting dementia: Secondary | ICD-10-CM

## 2022-06-03 DIAGNOSIS — Z515 Encounter for palliative care: Secondary | ICD-10-CM

## 2022-06-03 NOTE — Progress Notes (Addendum)
[facility] Consult Note Telephone: [phone]  Fax: [phone]    Date of encounter: 06/03/22 3:50 PM PATIENT NAME: [name] [id number] [address] [place] [address]   [phone] (home)  DOB: 10-16-1924 MRN: [id number] PRIMARY CARE PROVIDER:   [facility] [address] [name],  [place] [address] [place] [address] [phone]  RESPONSIBLE PARTY:    Contact Information     Name Relation Home Work Mobile   [name] Granddaughter   [phone]   [name] Granddaughter   [phone]     I met face to face with patient in facility. Palliative Care was asked to follow this patient by consultation request of  [name] Kirschner, PA-C/[facility] STR to address advance care planning and complex medical decision making. This is the initial visit.                                ASSESSMENT AND PLAN / RECOMMENDATIONS:  Symptom Management/Plan: 1. Advance Care Planning;  ongoing discussions 2. Goals of Care: Goals include to maximize quality of life and symptom management. Our advance care planning conversation included a discussion about:    The value and importance of advance care planning  Exploration of personal, cultural or spiritual beliefs that might influence medical decisions  Exploration of goals of care in the event of a sudden injury or illness  Identification and preparation of a healthcare agent  Review and updating or creation of an advance directive document. 3. Palliative care encounter; Palliative care encounter; Palliative medicine team will continue to support patient, patient's family, and medical team. Visit consisted of counseling and education dealing with the complex and emotionally intense issues of symptom management and palliative care in the setting of serious and potentially life-threatening illness   4. Debility secondary to CVA, dementia with  deconditioning; ongoing decline currently at STR with slow progression, decreased appetite, reviewed weights, continue to monitor progress, will need further discussion with family for goc and possible discharge plans with ?LTC.    Follow up Palliative Care Visit: PC f/u visit further discussion monitor trends of appetite, weights, monitor for functional, cognitive decline with chronic disease progression, assess any active symptoms, supportive role. Palliative care will continue to follow for complex medical decision making, advance care planning, and clarification of goals. Return 1 to 2 weeks or prn.   I spent 36 minutes providing this consultation starting at 9:45 am. More than 50% of the time in this consultation was spent in counseling and care coordination. PPS: 30%   Chief Complaint: Initial palliative consult for complex medical decision making, address goals, manage ongoing symptoms   HISTORY OF PRESENT ILLNESS:  [name] is a 89 y.o. year old female  with multiple medical problems including CVA, dementia, HTN. 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 Neurology consulted starting antiplatelet therapy with statin, remained stable and d/c to STR at [facility] were she currently resides. [name] per staff requires assistance for all adl's, transfers, feeding. Purpose of today PC f/u visit further discussion monitor trends of appetite, weights, monitor for functional, cognitive decline with chronic disease progression, assess any active symptoms, supportive role. At present [name] is lying in bed, makes eye contact, mumbles sounds, unable to identify words, continues to  attempt to interact, engage. [name] was cooperative with assessment, appears comfortable. Medications, poc, goc reviewed with hospital records.   Attempted to contact Laureen Abrahams  granddaughter for further discussions, will continue current poc at this time. Updated staff.   06/04/2022 [name] returned call, clinical update discussed. Talked about pc, past medical history, [name] was independent prior to hospitalization and now completely unable to care for herself, functionally and cognitively. We talked about social, family history. We talked about goc including str, appeals, therapy, option of hospice services under medicare benefit, LTC transition and home with home health or hospice. We talked about realistic expectations and challenges [name] faces currently with decision making. We talked about options of LTC facilities as [facility] shared they would not have a LTC bed available. Discussed will continue to follow, monitoring with recommendations. Contact information provided.   History obtained from review of EMR, discussion with primary team, and interview with family, facility staff/caregiver and/or [name]. Lorenda [name].  I reviewed available labs, medications, imaging, studies and related documents from the EMR.  Records reviewed and summarized above.  Physical Exam: Constitutional: NAD General: frail appearing, elderly female,  ENMT: oral mucous membranes moist CV: S1S2, RRR Pulmonary: LCTA Skin: warm and dry Neuro:  + generalized weakness,  + cognitive impairment Psych: flat affect, Alert, confused  Thank you for the opportunity to participate in the care of [name]. Lorenda [name]. Please call our office at [phone] if we can be of additional assistance.   Denvil Canning Ihor Gully, NP

## 2022-06-05 ENCOUNTER — Ambulatory Visit
Admission: RE | Admit: 2022-06-05 | Discharge: 2022-06-05 | Disposition: A | Discharge status: Home / Self Care | Payer: PPO | Source: Ambulatory Visit | Attending: Family Medicine | Admitting: Family Medicine

## 2022-06-05 DIAGNOSIS — R7989 Other specified abnormal findings of blood chemistry: Secondary | ICD-10-CM | POA: Diagnosis present

## 2022-06-05 DIAGNOSIS — R935 Abnormal findings on diagnostic imaging of other abdominal regions, including retroperitoneum: Secondary | ICD-10-CM | POA: Insufficient documentation

## 2022-06-09 ENCOUNTER — Non-Acute Institutional Stay: Payer: PPO | Admitting: Nurse Practitioner

## 2022-06-09 ENCOUNTER — Encounter: Payer: Self-pay | Admitting: Nurse Practitioner

## 2022-06-09 DIAGNOSIS — F1027 Alcohol dependence with alcohol-induced persisting dementia: Secondary | ICD-10-CM

## 2022-06-09 DIAGNOSIS — Z515 Encounter for palliative care: Secondary | ICD-10-CM

## 2022-06-09 DIAGNOSIS — I639 Cerebral infarction, unspecified: Secondary | ICD-10-CM

## 2022-06-09 DIAGNOSIS — R5381 Other malaise: Secondary | ICD-10-CM

## 2022-06-09 NOTE — Progress Notes (Signed)
[facility] Consult Note Telephone: [phone]  Fax: [phone]    Date of encounter: 06/09/22 12:15 PM PATIENT NAME: [name] [id number] [place] [address]   [phone] (home)  DOB: 07-Jan-1925 MRN: [id number] PRIMARY CARE PROVIDER:    [facility]  RESPONSIBLE PARTY:    Contact Information     Name Relation Home Work Mobile   [name],[name] Granddaughter   [phone]   [name],[name] Granddaughter   [phone]     I met face to face with patient in facility. Palliative Care was asked to follow this patient by consultation request of  Mikey Kirschner, PA-C/[facility] to address advance care planning and complex medical decision making. This is the initial visit.                                ASSESSMENT AND PLAN / RECOMMENDATIONS:  Symptom Management/Plan: 1. Advance Care Planning;  ongoing discussions 2. Goals of Care: Goals include to maximize quality of life and symptom management. Our advance care planning conversation included a discussion about:    The value and importance of advance care planning  Exploration of personal, cultural or spiritual beliefs that might influence medical decisions  Exploration of goals of care in the event of a sudden injury or illness  Identification and preparation of a healthcare agent  Review and updating or creation of an advance directive document. 3. Palliative care encounter; Palliative care encounter; Palliative medicine team will continue to support patient, patient's family, and medical team. Visit consisted of counseling and education dealing with the complex and emotionally intense issues of symptom management and palliative care in the setting of serious and potentially life-threatening illness   4. Debility secondary to CVA, dementia with deconditioning; ongoing decline currently at STR with slow progression, decreased appetite,  reviewed weights, continue to monitor progress, will need further discussion with family for goc and possible discharge plans with ?LTC.    Follow up Palliative Care Visit: PC f/u visit further discussion monitor trends of appetite, weights, monitor for functional, cognitive decline with chronic disease progression, assess any active symptoms, supportive role. Palliative care will continue to follow for complex medical decision making, advance care planning, and clarification of goals. Return 1 to 2 weeks or prn.   I spent 47 minutes providing this consultation starting at . More than 50% of the time in this consultation was spent in counseling and care coordination. PPS: 30%   Chief Complaint: Initial palliative consult for complex medical decision making, address goals, manage ongoing symptoms   HISTORY OF PRESENT ILLNESS:  [name] is a 89 y.o. year old female  with multiple medical problems including CVA, dementia, HTN. 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 Neurology consulted starting antiplatelet therapy with statin, remained stable and d/c to STR at [facility] were she currently resides. Ms Bystrom per staff requires assistance for all adl's, transfers, feeding. Purpose of today PC f/u visit further discussion monitor trends of appetite, weights, monitor for functional, cognitive decline with chronic disease progression, assess any active symptoms, supportive role.    History obtained from review of EMR, discussion with primary team, and interview with family, facility staff/caregiver and/or [name].  I reviewed available labs, medications, imaging, studies and  related documents from the EMR.  Records reviewed and summarized above.  Physical Exam: Constitutional: NAD General: frail appearing, elderly female,  ENMT: oral mucous membranes moist CV: S1S2,  RRR Pulmonary: LCTA Skin: warm and dry Neuro:  + generalized weakness,  + cognitive impairment Psych: flat affect, Alert, confused Thank you for the opportunity to participate in the care of [name]. Please call our office at [phone] if we can be of additional assistance.   Renold Kozar Ihor Gully, NP

## 2022-06-12 ENCOUNTER — Encounter: Payer: Self-pay | Admitting: Nurse Practitioner

## 2022-06-12 ENCOUNTER — Non-Acute Institutional Stay: Payer: PPO | Admitting: Nurse Practitioner

## 2022-06-12 DIAGNOSIS — Z515 Encounter for palliative care: Secondary | ICD-10-CM

## 2022-06-12 DIAGNOSIS — R5381 Other malaise: Secondary | ICD-10-CM

## 2022-06-12 DIAGNOSIS — F1027 Alcohol dependence with alcohol-induced persisting dementia: Secondary | ICD-10-CM

## 2022-06-12 DIAGNOSIS — I639 Cerebral infarction, unspecified: Secondary | ICD-10-CM

## 2022-06-12 NOTE — Progress Notes (Signed)
[facility] Consult Note Telephone: [phone]  Fax: [phone]    Date of encounter: 06/12/22 7:33 PM PATIENT NAME: [name] [id number] [address] [address]   [phone] (home)  DOB: [birth year] MRN: [id number] PRIMARY CARE PROVIDER:   [facility] [address] [place],  [place] [address] [place] [address] [phone]  RESPONSIBLE PARTY:    Contact Information     Name Relation Home Work Mobile   Lrhezzioui,[name] "[name]" Granddaughter   [phone]   [name],[name] Granddaughter   [phone]     I met face to face with patient in facility. Palliative Care was asked to follow this patient by consultation request of  [name], [name] to address advance care planning and complex medical decision making. This is the initial visit.                                ASSESSMENT AND PLAN / RECOMMENDATIONS:  Symptom Management/Plan: 1. Advance Care Planning;  ongoing discussions 2. Goals of Care: Goals include to maximize quality of life and symptom management. Our advance care planning conversation included a discussion about:    The value and importance of advance care planning  Exploration of personal, cultural or spiritual beliefs that might influence medical decisions  Exploration of goals of care in the event of a sudden injury or illness  Identification and preparation of a healthcare agent  Review and updating or creation of an advance directive document. 3. Palliative care encounter; Palliative care encounter; Palliative medicine team will continue to support patient, patient's family, and medical team. Visit consisted of counseling and education dealing with the complex and emotionally intense issues of symptom management and palliative care in the setting of serious and potentially life-threatening illness   4. Debility secondary to CVA, dementia with  deconditioning; ongoing decline currently at [name] with slow progression, decreased appetite, reviewed weights, continue to monitor progress, discussed with [name], grand-daughter, wishes are for placement LTC facility with hospice services if eligible.   5. Elevated LFT's with pancreatic nodule:  06/05/2022 CT results:  IMPRESSION: No CT findings to account for the patient's abnormal LFTs. Unenhanced liver is unremarkable on CT. No intrahepatic or extrahepatic ductal dilatation status post cholecystectomy.   Sequela of prior/chronic pancreatitis with progressive pseudocysts in the pancreatic head/neck, measuring up to 18 mm.   Small left pleural effusion with compressive atelectasis in the left lower lobe, incompletely visualized.   Additional stable ancillary findings as above.   Please note that the pelvis was not imaged. Follow up Palliative Care Visit: PC f/u visit further discussion monitor trends of appetite, weights, monitor for functional, cognitive decline with chronic disease progression, assess any active symptoms, supportive role. Palliative care will continue to follow for complex medical decision making, advance care planning, and clarification of goals. Return 1 to 2 weeks or prn.   I spent 51 minutes providing this consultation. More than 50% of the time in this consultation was spent in counseling and care coordination. PPS: 30%   Chief Complaint: Initial palliative consult for complex medical decision making, address goals, manage ongoing symptoms   HISTORY OF PRESENT ILLNESS:  [name] is a 89 y.o. year old female  with multiple medical problems including CVA, dementia, HTN. Hospitalized 05/08/2022 to 05/14/2022 from home as she has a home health aid, found on the floor, workup significant for left  thalamic infarction found on MR brain 05/11/2022. Neurology consulted starting antiplatelet therapy with statin, remained stable and d/c to [name] were she currently  resides. [name] per staff requires assistance for all adl's, transfers, feeding. Purpose of today PC f/u visit further discussion monitor trends of appetite, weights, monitor for functional, cognitive decline with chronic disease progression, assess any active symptoms, supportive role. At present [name]   History obtained from review of EMR, discussion with primary team, and interview with family, facility staff/caregiver and/or [name]. [name].  I reviewed available labs, medications, imaging, studies and related documents from the EMR.  Records reviewed and summarized above.  Physical Exam: Constitutional: NAD General: frail appearing, elderly female,  ENMT: oral mucous membranes moist CV: S1S2, RRR Pulmonary: LCTA Skin: warm and dry Neuro:  + generalized weakness,  + cognitive impairment Psych: flat affect, Alert, confused Thank you for the opportunity to participate in the care of [name]. [name]. Please call our office at [phone] if we can be of additional assistance.   Karleigh Bunte Ihor Gully, NP

## 2022-06-17 ENCOUNTER — Encounter: Payer: Self-pay | Admitting: Nurse Practitioner

## 2022-06-17 ENCOUNTER — Non-Acute Institutional Stay: Payer: PPO | Admitting: Nurse Practitioner

## 2022-06-17 DIAGNOSIS — I639 Cerebral infarction, unspecified: Secondary | ICD-10-CM

## 2022-06-17 DIAGNOSIS — R5381 Other malaise: Secondary | ICD-10-CM

## 2022-06-17 DIAGNOSIS — F1027 Alcohol dependence with alcohol-induced persisting dementia: Secondary | ICD-10-CM

## 2022-06-17 DIAGNOSIS — Z515 Encounter for palliative care: Secondary | ICD-10-CM

## 2022-06-17 NOTE — Progress Notes (Signed)
[facility] Consult Note Telephone: [phone]  Fax: [phone]    Date of encounter: 06/17/22 7:22 PM PATIENT NAME: [name] [id number] [place] [address]   [phone] (home)  DOB: Dec 19, 1924 MRN: [id number] PRIMARY CARE PROVIDER:   [facility] [address] [name],  [place] [address] [place] [address] [phone] RESPONSIBLE PARTY:    Contact Information     Name Relation Home Work Mobile   [name],[name] "[name]" Granddaughter   [phone]   Allyson,Brittany Granddaughter   [phone]     I  met face to face with patient in facility. Palliative Care was asked to follow this patient by consultation request of  [name] Kirschner, PA-C/[facility] STR to address advance care planning and complex medical decision making. This is the initial visit.                                ASSESSMENT AND PLAN / RECOMMENDATIONS:  Symptom Management/Plan: 1. Advance Care Planning;  DNR with focus on comfort, minimize suffering, ongoing discussions 2. Palliative care encounter; Palliative care encounter; Palliative medicine team will continue to support patient, patient's family, and medical team. Visit consisted of counseling and education dealing with the complex and emotionally intense issues of symptom management and palliative care in the setting of serious and potentially life-threatening illness   3. Debility secondary to CVA, dementia with deconditioning; ongoing decline currently at STR with slow progression, decreased appetite, reviewed weights, continue to monitor progress, discussed with [name], grand-daughter, wishes are for placement LTC facility with hospice services if eligible. 01/13/2022 weight 128.2 lbs 06/10/2022 weight 118.4 lbs 9.8 lbs/5 months; 7.64% 4. Elevated LFT's with pancreatic nodule:  06/05/2022 CT results:  IMPRESSION: No CT findings to account for the patient's  abnormal LFTs. Unenhanced liver is unremarkable on CT. No intrahepatic or extrahepatic ductal dilatation status post cholecystectomy. Sequela of prior/chronic pancreatitis with progressive pseudocysts in the pancreatic head/neck, measuring up to 18 mm. Small left pleural effusion with compressive atelectasis in the left lower lobe, incompletely visualized.  Follow up Palliative Care Visit: PC f/u visit further discussion monitor trends of appetite, weights, monitor for functional, cognitive decline with chronic disease progression, assess any active symptoms, supportive role. Palliative care will continue to follow for complex medical decision making, advance care planning, and clarification of goals. Return 1 to 2 weeks or prn.   I spent 45 minutes providing this consultation. More than 50% of the time in this consultation was spent in counseling and care coordination. PPS: 30%   Chief Complaint: Initial palliative consult for complex medical decision making, address goals, manage ongoing symptoms   HISTORY OF PRESENT ILLNESS:  LYNDEN BORAK is a 89 y.o. year old female  with multiple medical problems including CVA, dementia, HTN. 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 Neurology consulted starting antiplatelet therapy with statin, remained stable and d/c to STR at [facility] were she currently resides. Ms Kaminski per staff requires assistance for all adl's, transfers, feeding. Purpose of today PC f/u visit further discussion monitor trends of appetite, weights, monitor for functional, cognitive decline with chronic disease progression, assess any active symptoms, supportive role. At present Ms Wesby is lying in bed, appears comfortable, no distress. Ms Marotti does make eye contact with  verbal cues. Ms Burk was not as engaged this visit as previous. Ms Carlozzi appears tired  today. Assisted Ms Rotert with feeding herself, has large ended utensils, appears to be having more difficult with feeding herself getting the food on her spoon, then up to her mouth. Ms Eargle was cooperative. No meaningful discussion with cognitive impairment. Ms Ay was granted her appeal for STR. Will contact Robin, granddaughter for update as she looks for LTC with medicaid pending. Support provided.    History obtained from review of EMR, discussion with primary team, and interview with family, facility staff/caregiver and/or Ms. [name].  I reviewed available labs, medications, imaging, studies and related documents from the EMR.  Records reviewed and summarized above.  Physical Exam: Constitutional: NAD General: frail appearing, elderly female, debilitated.  ENMT: oral mucous membranes moist CV: S1S2, RRR Pulmonary: LCTA Skin: warm and dry Neuro:  + generalized weakness,  + cognitive impairment Psych: flat affect Thank you for the opportunity to participate in the care of Ms. [name]. Please call our office at [phone] if we can be of additional assistance.   Shantasia Hunnell Ihor Gully, NP

## 2022-06-18 ENCOUNTER — Encounter: Payer: Self-pay | Admitting: Nurse Practitioner

## 2022-06-18 ENCOUNTER — Non-Acute Institutional Stay: Payer: PPO | Admitting: Nurse Practitioner

## 2022-06-18 DIAGNOSIS — F1027 Alcohol dependence with alcohol-induced persisting dementia: Secondary | ICD-10-CM

## 2022-06-18 DIAGNOSIS — R5381 Other malaise: Secondary | ICD-10-CM

## 2022-06-18 DIAGNOSIS — I639 Cerebral infarction, unspecified: Secondary | ICD-10-CM

## 2022-06-18 DIAGNOSIS — Z515 Encounter for palliative care: Secondary | ICD-10-CM

## 2022-06-18 NOTE — Progress Notes (Signed)
[facility] Palliative Care Consult Note Telephone: [phone]  Fax: [phone]    Date of encounter: 06/18/22 5:14 PM PATIENT NAME: [name] [place] [address]   [phone] (home)  DOB: [birth year] MRN: [id number] PRIMARY CARE PROVIDER:    [facility]  RESPONSIBLE PARTY:    Contact Information     Name Relation Home Work Mobile   [name],[name] Granddaughter   [phone]   Allyson,Brittany Granddaughter   [phone]     I  met face to face with patient in facility. Palliative Care was asked to follow this patient by consultation request of  Mikey Kirschner, PA-C/[facility] to address advance care planning and complex medical decision making. This is the initial visit.                                ASSESSMENT AND PLAN / RECOMMENDATIONS:  Symptom Management/Plan: 1. Advance Care Planning;  DNR with focus on comfort, minimize suffering, ongoing discussions 2. Palliative care encounter; Palliative care encounter; Palliative medicine team will continue to support patient, patient's family, and medical team. Visit consisted of counseling and education dealing with the complex and emotionally intense issues of symptom management and palliative care in the setting of serious and potentially life-threatening illness   3. Debility secondary to CVA, dementia with deconditioning; ongoing decline currently at STR with slow progression, decreased appetite, reviewed weights, continue to monitor progress, discussed with [name], grand-daughter, wishes are for placement LTC facility with hospice services if eligible. 01/13/2022 weight 128.2 lbs 06/10/2022 weight 118.4 lbs 9.8 lbs/5 months; 7.64% 4. Elevated LFT's with pancreatic nodule:  06/05/2022 CT results:  IMPRESSION: No CT findings to account for the patient's abnormal LFTs. Unenhanced liver is unremarkable on CT. No intrahepatic or extrahepatic  ductal dilatation status post cholecystectomy. Sequela of prior/chronic pancreatitis with progressive pseudocysts in the pancreatic head/neck, measuring up to 18 mm. Small left pleural effusion with compressive atelectasis in the left lower lobe, incompletely visualized.  Follow up Palliative Care Visit: PC f/u visit further discussion monitor trends of appetite, weights, monitor for functional, cognitive decline with chronic disease progression, assess any active symptoms, supportive role. Palliative care will continue to follow for complex medical decision making, advance care planning, and clarification of goals. Return 1 to 2 weeks or prn.   I spent 46 minutes providing this consultation. More than 50% of the time in this consultation was spent in counseling and care coordination. PPS: 30%   Chief Complaint: Initial palliative consult for complex medical decision making, address goals, manage ongoing symptoms   HISTORY OF PRESENT ILLNESS:  JOSELIN SCHOENDORF is a 89 y.o. year old female  with multiple medical problems including CVA, dementia, HTN. 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 Neurology consulted starting antiplatelet therapy with statin, remained stable and d/c to STR at [facility] were she currently resides. Ms Pillado per staff requires assistance for all adl's, transfers, feeding. Purpose of today PC f/u visit further discussion monitor trends of appetite, weights, monitor for functional, cognitive decline with chronic disease progression, assess any active symptoms, supportive role. At present Ms Hopson is lying in bed. Ms Longden was not as engaged this visit as previous. Ms Madera appears sleepy, nods to answers, making eye contact, cooperative  with assessment. Ms Forgues fell asleep multiple times during pc visit. Ms Bochenek was non-verbal today, very disengaged.  Support provided. Medications, poc, goc reviewed.  I attempted to contact [name], granddaughter for update as she looks for LTC with medicaid pending. Support provided.    History obtained from review of EMR, discussion with primary team, and interview with family, facility staff/caregiver and/or [name].  I reviewed available labs, medications, imaging, studies and related documents from the EMR.  Records reviewed and summarized above.  Physical Exam: Constitutional: NAD General: frail appearing, elderly female, debilitated, sleepy ENMT: oral mucous membranes moist CV: S1S2, RRR Pulmonary: LCTA Skin: warm and dry Neuro:  + generalized weakness,  + cognitive impairment Psych: flat affect Thank you for the opportunity to participate in the care of [name]. Please call our office at [phone] if we can be of additional assistance.   Debbra Digiulio Ihor Gully, NP

## 2022-06-25 ENCOUNTER — Non-Acute Institutional Stay: Payer: PPO | Admitting: Nurse Practitioner

## 2022-06-25 DIAGNOSIS — R5381 Other malaise: Secondary | ICD-10-CM

## 2022-06-25 DIAGNOSIS — Z515 Encounter for palliative care: Secondary | ICD-10-CM

## 2022-06-25 DIAGNOSIS — F1027 Alcohol dependence with alcohol-induced persisting dementia: Secondary | ICD-10-CM

## 2022-06-25 DIAGNOSIS — I639 Cerebral infarction, unspecified: Secondary | ICD-10-CM

## 2022-06-25 NOTE — Progress Notes (Addendum)
[facility] Palliative Care Consult Note Telephone: [phone]  Fax: [phone]    Date of encounter: 06/25/22 3:04 PM PATIENT NAME: [name] [id number] [place] [address]   [phone] (home)  DOB: [birth year] MRN: [id number] PRIMARY CARE PROVIDER:   [facility] [address] [place],  [place] [address] [place] [address] [phone]  RESPONSIBLE PARTY:    Contact Information     Name Relation Home Work Mobile   Lrhezzioui,Myra "[name]" Granddaughter   [phone]   Allyson,Brittany Granddaughter   [phone]        I  met face to face with patient in facility. Palliative Care was asked to follow this patient by consultation request of  Mikey Kirschner, PA-C/[facility] to address advance care planning and complex medical decision making. This is the initial visit.                                ASSESSMENT AND PLAN / RECOMMENDATIONS:  Symptom Management/Plan: 1. Advance Care Planning;  DNR with focus on comfort, minimize suffering, ongoing discussions 2. Palliative care encounter; Palliative care encounter; Palliative medicine team will continue to support patient, patient's family, and medical team. Visit consisted of counseling and education dealing with the complex and emotionally intense issues of symptom management and palliative care in the setting of serious and potentially life-threatening illness   3. Debility secondary to CVA, dementia with deconditioning; ongoing decline currently at STR with slow progression, decreased appetite, reviewed weights, continue to monitor progress, discussed with [name], grand-daughter, wishes are for placement LTC facility with hospice services if eligible. 01/13/2022 weight 128.2 lbs 06/10/2022 weight 118.4 lbs 9.8 lbs/5 months; 7.64% 4. Elevated LFT's with pancreatic nodule:  06/05/2022 CT results:  IMPRESSION: No CT findings to account for the patient's  abnormal LFTs. Unenhanced liver is unremarkable on CT. No intrahepatic or extrahepatic ductal dilatation status post cholecystectomy. Sequela of prior/chronic pancreatitis with progressive pseudocysts in the pancreatic head/neck, measuring up to 18 mm. Small left pleural effusion with compressive atelectasis in the left lower lobe, incompletely visualized.  Follow up Palliative Care Visit: PC f/u visit further discussion monitor trends of appetite, weights, monitor for functional, cognitive decline with chronic disease progression, assess any active symptoms, supportive role. Palliative care will continue to follow for complex medical decision making, advance care planning, and clarification of goals. Return 1 to 2 weeks or prn.   I spent 45 minutes providing this consultation. More than 50% of the time in this consultation was spent in counseling and care coordination. PPS: 30%   Chief Complaint: Initial palliative consult for complex medical decision making, address goals, manage ongoing symptoms   HISTORY OF PRESENT ILLNESS:  [name] is a 89 y.o. year old female  with multiple medical problems including CVA, dementia, HTN. 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 Neurology consulted starting antiplatelet therapy with statin, remained stable and d/c to STR at [facility] were she currently resides. Ms Philipps per staff requires assistance for all adl's, transfers, feeding. Purpose of today PC f/u visit further discussion monitor trends of appetite, weights, monitor for functional, cognitive decline with chronic disease progression, assess any active symptoms, supportive role. At present Ms Fullenwider is lying in bed. Ms Coyle does make eye contact, not  much verbal response to questions. Ms Snyders appears to be overall declining, functionally and cognitively. Support  provided. Medications, poc, goc reviewed. I discussed with [facility] and hoping to get a LTC bed at the facility, also [name] is appealing the insurance for STR.  I attempted to contact [name], granddaughter message left for update as she looks for LTC with medicaid pending. Support provided.   [name], Ms Boruta granddaughter returned my call, clinical update discussed, talked about pc visit, overall decline, debility, transition to LTC, acute event resulting in chronic disease progression. We talked about hospice benefit under medicare program. Discussed will continue to follow during str, Robin verbalized won the appeal to continue therapy. Support provided   History obtained from review of EMR, discussion with primary team, and interview with family, facility staff/caregiver and/or [name].  I reviewed available labs, medications, imaging, studies and related documents from the EMR.  Records reviewed and summarized above.  Physical Exam: Constitutional: NAD General: frail appearing, elderly female, debilitated, sleepy ENMT: oral mucous membranes moist CV: S1S2, RRR Pulmonary: LCTA Skin: warm and dry Neuro:  + generalized weakness,  + cognitive impairment Psych: flat affect Thank you for the opportunity to participate in the care of [name]. Please call our office at [phone] if we can be of additional assistance.   Anthoni Geerts Ihor Gully, NP

## 2022-07-12 ENCOUNTER — Other Ambulatory Visit: Payer: Self-pay | Admitting: Family Medicine

## 2022-07-12 DIAGNOSIS — I10 Essential (primary) hypertension: Secondary | ICD-10-CM

## 2022-07-13 ENCOUNTER — Other Ambulatory Visit: Payer: Self-pay | Admitting: Family Medicine

## 2022-07-13 DIAGNOSIS — I130 Hypertensive heart and chronic kidney disease with heart failure and stage 1 through stage 4 chronic kidney disease, or unspecified chronic kidney disease: Secondary | ICD-10-CM

## 2022-07-13 DIAGNOSIS — I10 Essential (primary) hypertension: Secondary | ICD-10-CM

## 2022-07-20 ENCOUNTER — Non-Acute Institutional Stay: Payer: PPO | Admitting: Nurse Practitioner

## 2022-07-20 DIAGNOSIS — R634 Abnormal weight loss: Secondary | ICD-10-CM

## 2022-07-20 DIAGNOSIS — I639 Cerebral infarction, unspecified: Secondary | ICD-10-CM

## 2022-07-20 DIAGNOSIS — R5381 Other malaise: Secondary | ICD-10-CM

## 2022-07-20 DIAGNOSIS — Z515 Encounter for palliative care: Secondary | ICD-10-CM

## 2022-07-20 DIAGNOSIS — R630 Anorexia: Secondary | ICD-10-CM

## 2022-07-20 DIAGNOSIS — F1027 Alcohol dependence with alcohol-induced persisting dementia: Secondary | ICD-10-CM

## 2022-07-20 NOTE — Progress Notes (Signed)
[facility] Palliative Care Consult Note Telephone: [phone]  Fax: [phone]    Date of encounter: 07/20/22 2:23 PM PATIENT NAME: [name] [address] [place] [address]   [phone] (home)  DOB: Oct 01, 1924 MRN: [id number] PRIMARY CARE PROVIDER:    [facility]  RESPONSIBLE PARTY:    Contact Information     Name Relation Home Work Mobile   Lrhezzioui,Myra "Zella Ball" Granddaughter   [phone]   Allyson,Brittany Granddaughter   [phone]     I  met face to face with patient in facility. Palliative Care was asked to follow this patient by consultation request of  Alfredia Ferguson, PA-C/[facility] to address advance care planning and complex medical decision making. This is the initial visit.                                ASSESSMENT AND PLAN / RECOMMENDATIONS:  Symptom Management/Plan: 1. Advance Care Planning;  DNR with focus on comfort, minimize suffering, ongoing discussions. Lengthily discussion with Zella Ball, granddaughter for goc, option of hospice services under medicare benefit with what is provided. Wishes are to proceed with hospice evaluation  3 months; 14.5 lbs/ 11.31 %; PPS 60% 4 weeks ago/4.7 lbs; 3.97%; PPS 30% Severely debilitated, ongoing weight loss; 3 months ago independent all ADL's, feeding herself; Now total dependent, requires assistance/to be fed >30 minutes, worsening dysphagia; sleeping >16 hrs daily; aphasic +COVID infection;   2. Palliative care encounter; Palliative care encounter; Palliative medicine team will continue to support patient, patient's family, and medical team. Visit consisted of counseling and education dealing with the complex and emotionally intense issues of symptom management and palliative care in the setting of serious and potentially life-threatening illness   3. Debility secondary to CVA, dementia with deconditioning; ongoing decline currently at STR with slow  progression, decreased appetite, reviewed weights, continue to monitor progress, discussed with Zella Ball, grand-daughter, wishes are for placement LTC facility with hospice services if eligible. 03/15/2022 weight 128.2 lbs 06/10/2022 weight 118.4 lbs 07/16/2022 weight 113.7 lbs 14.5 lbs/3 months; 11.31 % 4.7 lbs/4 month; 3.97% 4. Elevated LFT's with pancreatic nodule:  06/05/2022 CT results:  IMPRESSION: No CT findings to account for the patient's abnormal LFTs. Unenhanced liver is unremarkable on CT. No intrahepatic or extrahepatic ductal dilatation status post cholecystectomy. Sequela of prior/chronic pancreatitis with progressive pseudocysts in the pancreatic head/neck, measuring up to 18 mm. Small left pleural effusion with compressive atelectasis in the left lower lobe, incompletely visualized.  Follow up Palliative Care Visit: PC f/u visit further discussion monitor trends of appetite, weights, monitor for functional, cognitive decline with chronic disease progression, assess any active symptoms, supportive role. Palliative care will continue to follow for complex medical decision making, advance care planning, and clarification of goals. Return 1 to 2 weeks or prn.   I spent 46 minutes providing this consultation. More than 50% of the time in this consultation was spent in counseling and care coordination. PPS: 30%   Chief Complaint: Initial palliative consult for complex medical decision making, address goals, manage ongoing symptoms   HISTORY OF PRESENT ILLNESS:  [name] is a 89 y.o. year old female  with multiple medical problems including CVA, dementia, HTN. 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 Neurology consulted starting antiplatelet therapy  with statin, remained stable and d/c to STR at [facility] were she currently resides. [name] per staff  requires assistance for all adl's, transfers, feeding. Purpose of today PC f/u visit further discussion monitor trends of appetite, weights, monitor for functional, cognitive decline with chronic disease progression, assess any active symptoms, supportive role. At present [name] is lying in bed. [name] does make eye contact, no verbal responses. [name] was cooperative with assessment. Support provided. Reviewed weights, medications, discussed with nursing, attempted to contact granddaughter, Zella Ball for further discussion of goc, option of hospice with ongoing weight loss, overall decline. Robin returned call, in agreement to proceed with hospice evaluation, will request from [facility]. Support provided, questions answered  History obtained from review of EMR, discussion with primary team, and interview with family, facility staff/caregiver and/or [name]. [name].  I reviewed available labs, medications, imaging, studies and related documents from the EMR.  Records reviewed and summarized above.  Physical Exam: General: frail appearing, elderly female ENMT: oral mucous membranes moist CV: S1S2, RRR Pulmonary: LCTA Neuro:  + generalized weakness,  + cognitive impairment Psych: alert, confused Thank you for the opportunity to participate in the care of [name]. [name]. Please call our office at [phone] if we can be of additional assistance.   Aarya Robinson [place], NP

## 2023-02-14 IMAGING — CR DG HIP (WITH OR WITHOUT PELVIS) 2-3V*L*
1 series · 3 of 3 positions shown · non-contrast
Comparison: None.

CLINICAL DATA: Pain following fall

EXAM:
DG HIP (WITH OR WITHOUT PELVIS) 2-3V LEFT

[Series 1: dg hip unilat w or w/o pelvis 2-3 views  · non-contrast · 0.14mm/px · 3 of 3 slices shown]
[im 1/3]
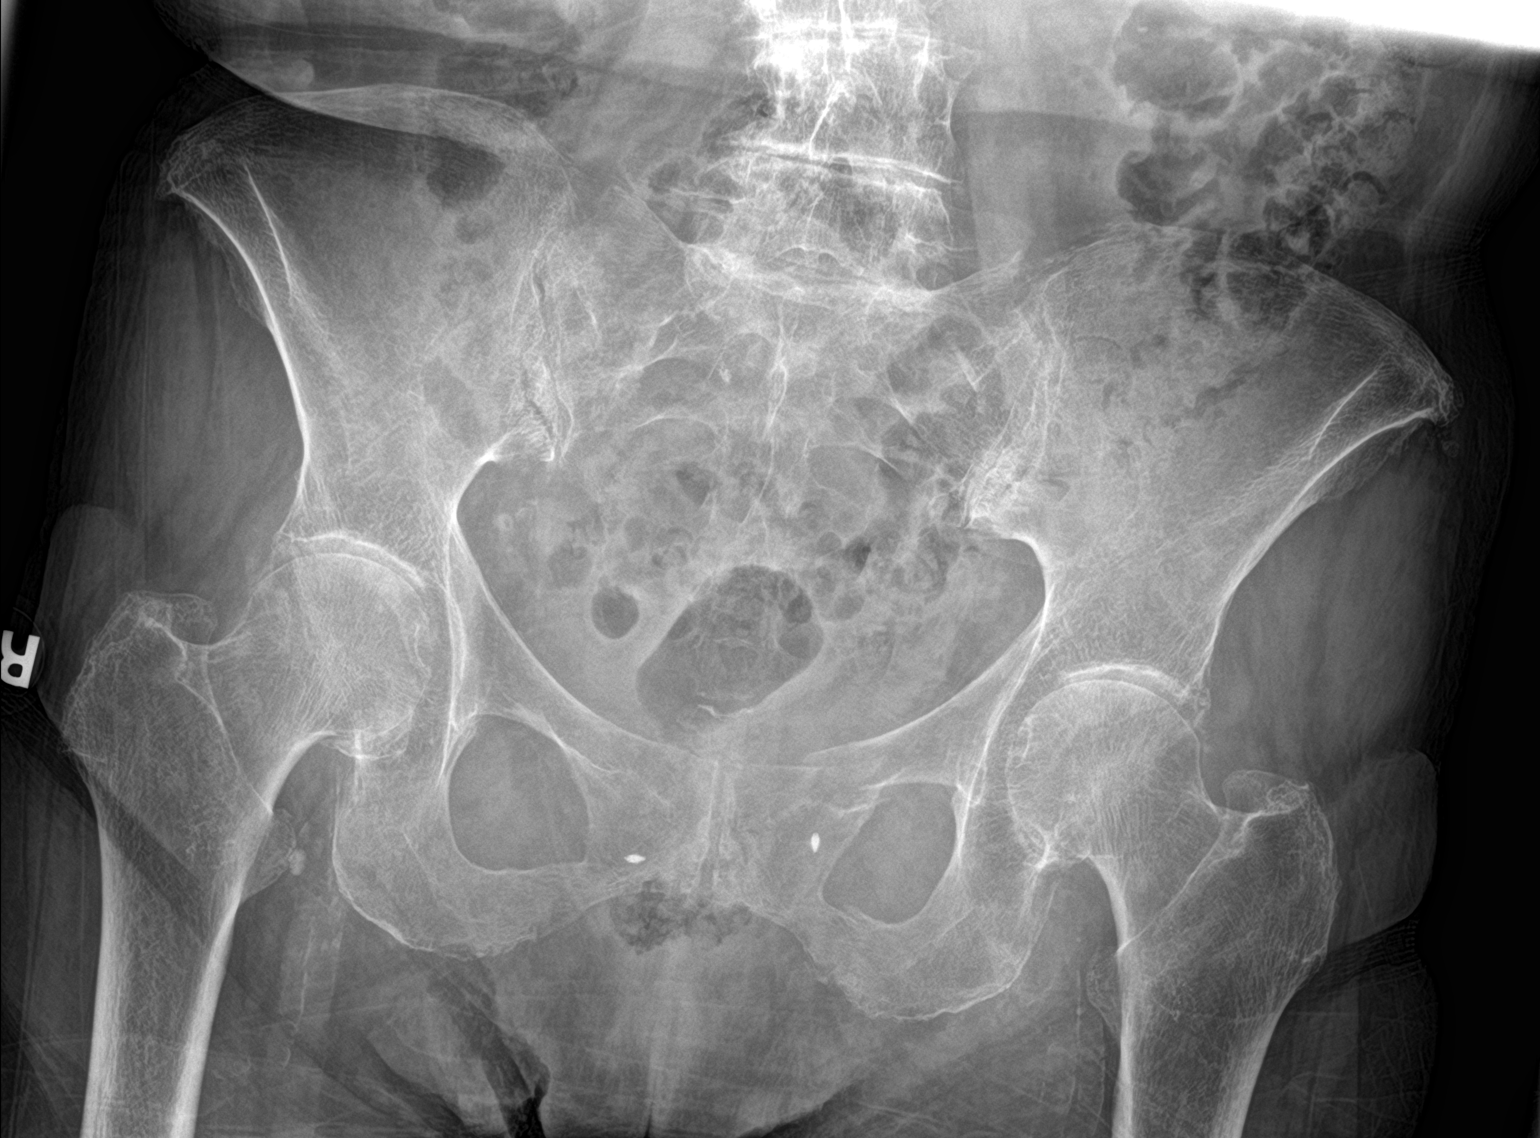
[im 2/3]
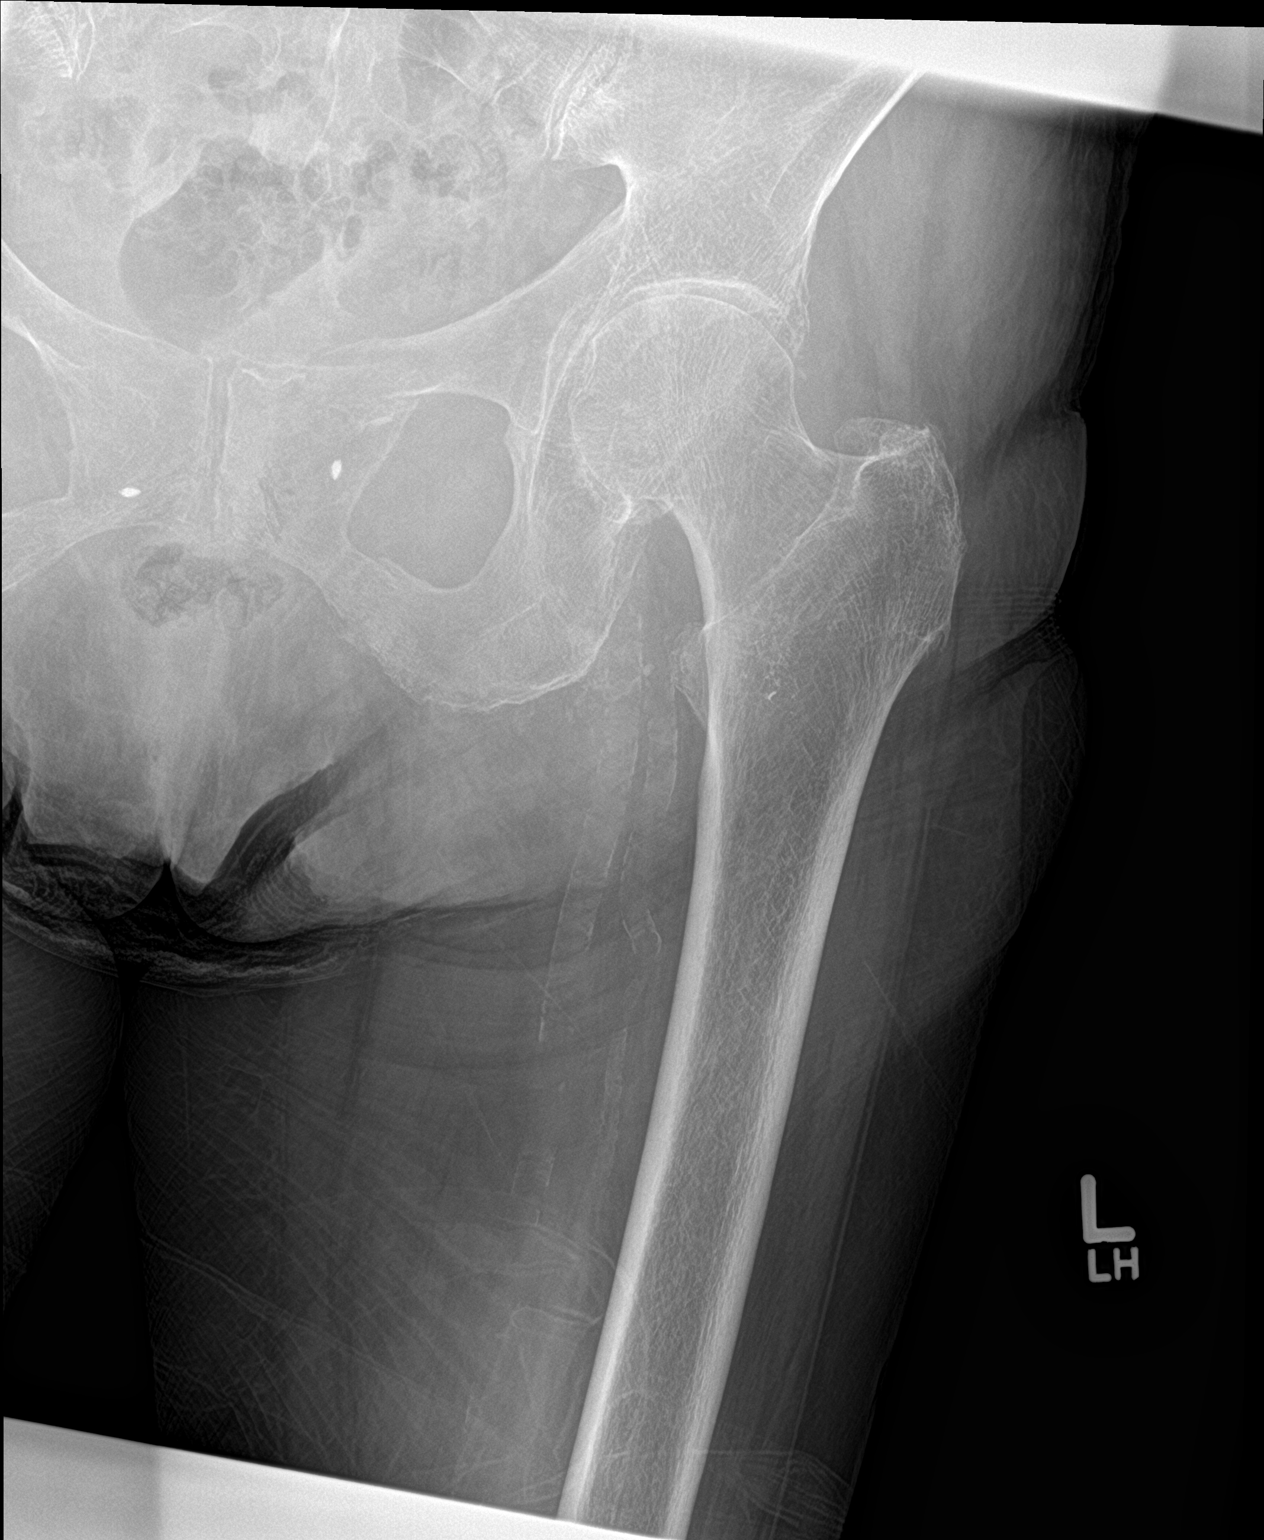
[im 3/3]
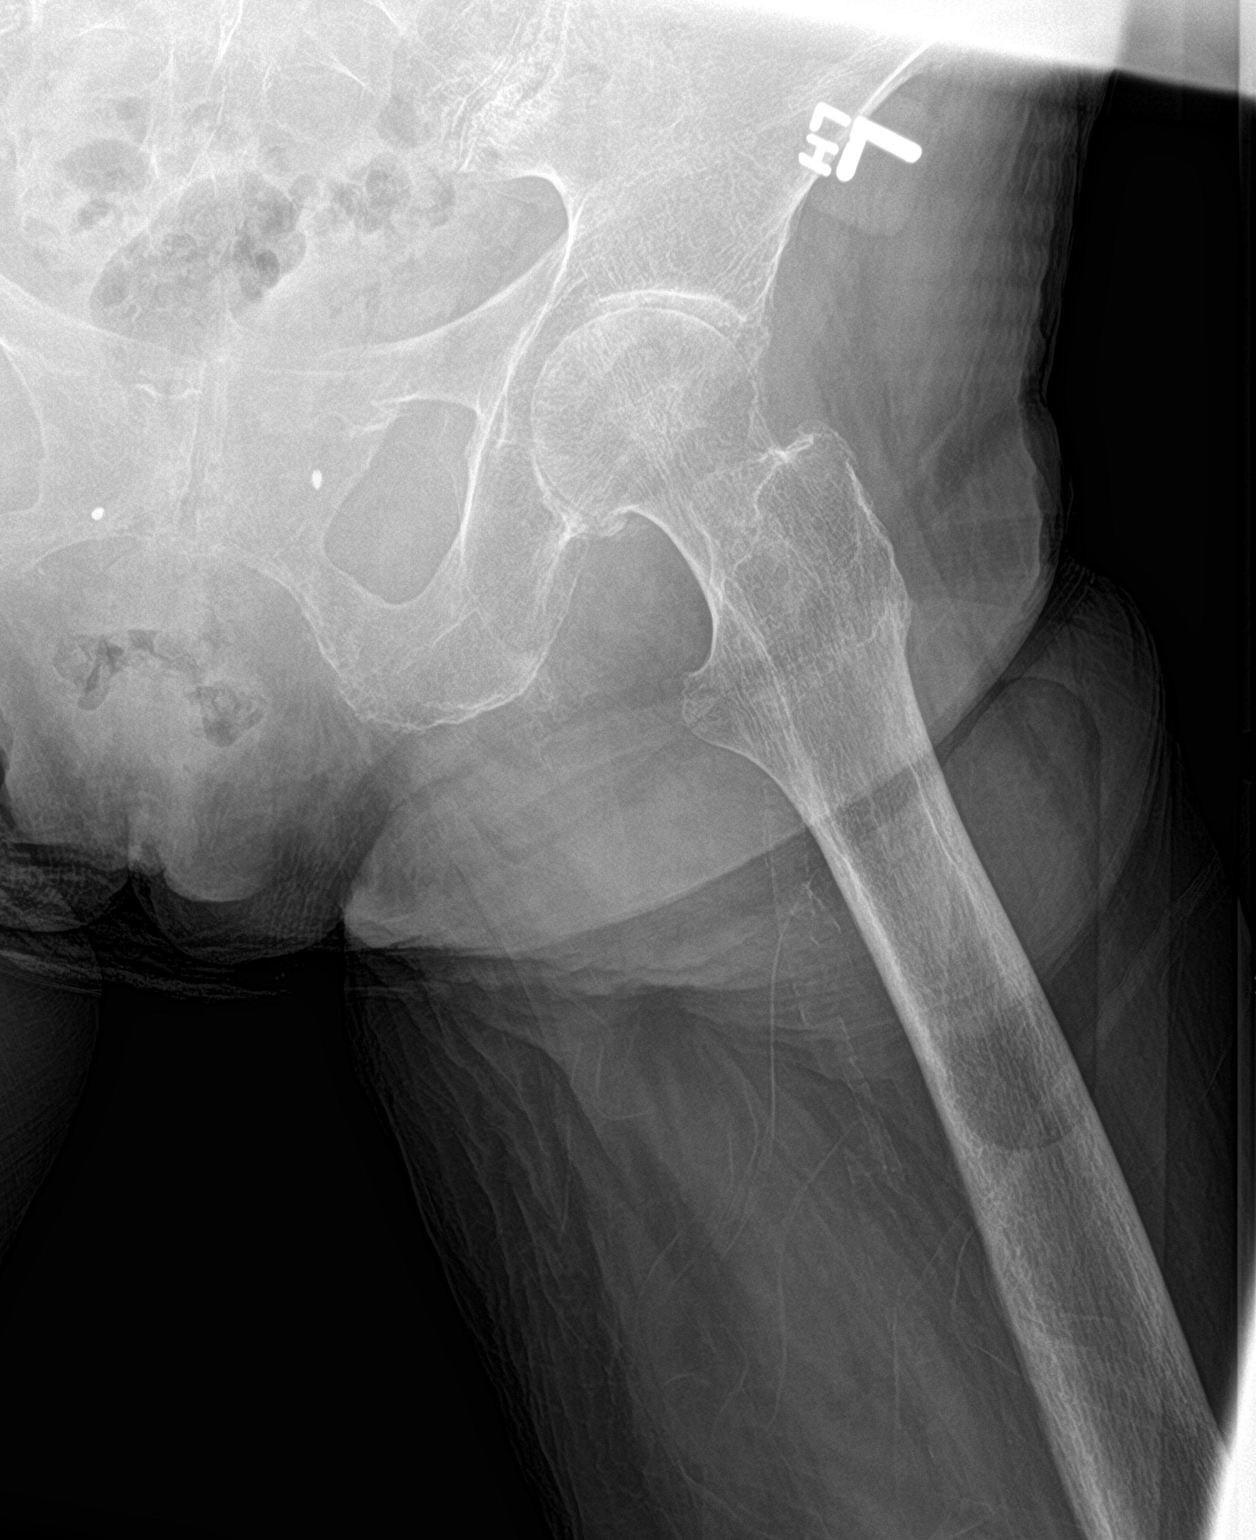

[3 of 3 positions shown; findings below may reference images not displayed]

FINDINGS: Frontal pelvis as well as frontal and lateral left hip images were
obtained. There is diffuse osteoporosis. There is a fracture of the
midportion of the left superior pubic ramus as well as a fracture of
the mid left ischium with mild displacement of fracture fragments in
these areas. No other appreciable fractures. No dislocations.
Moderate symmetric narrowing of each hip joint noted. Postoperative
change in each pubic symphysis. Degenerative change noted in the
visualized lumbar spine. Foci of arterial vascular calcification
noted in each thigh region.
IMPRESSION: Mildly displaced fractures of the left superior pubic ramus and
ischium. Proximal femurs appear intact. No dislocation. Moderate
symmetric narrowing of each hip joint noted. Bones osteoporotic.

## 2023-02-14 IMAGING — CT CT HEAD W/O CM
3 series · 16 of 47 positions shown, 19 images · non-contrast
Comparison: CT head 07/21/2015

CLINICAL DATA: Head trauma.  Recent fall.

EXAM:
CT HEAD WITHOUT CONTRAST
TECHNIQUE: Contiguous axial images were obtained from the base of the skull
through the vertex without intravenous contrast.

[Series 2: head wo · axial · 0.46mm/px · z∈[-55,+85]mm · 10 of 34 slices shown, 13 images]
[im 3/34  brain]
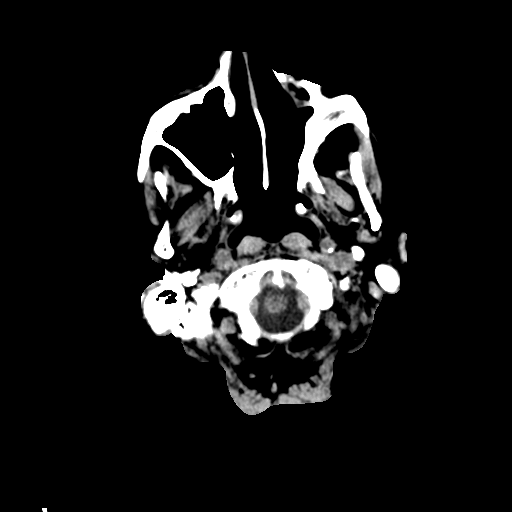
[im 3/34  bone]
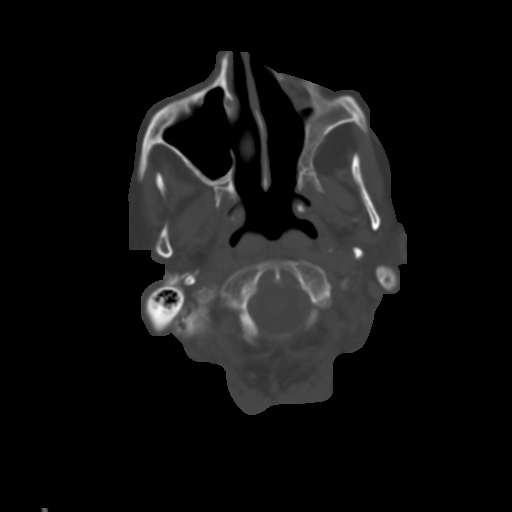
[im 6/34  brain]
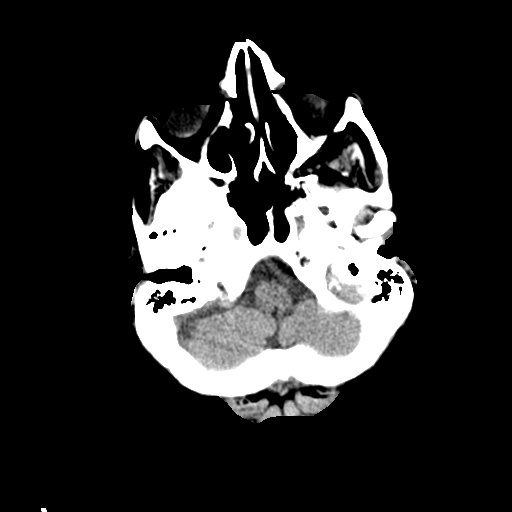
[im 10/34  brain]
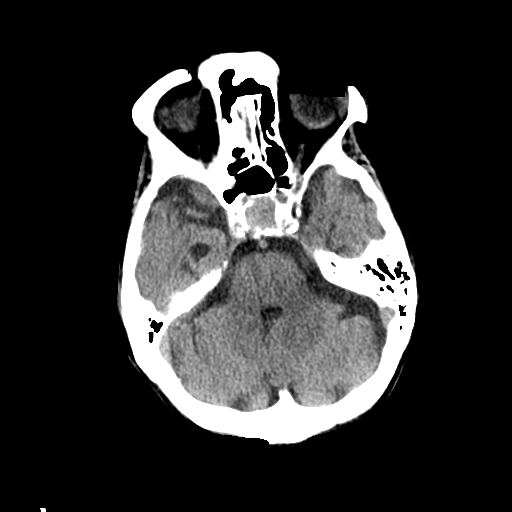
[im 12/34  brain]
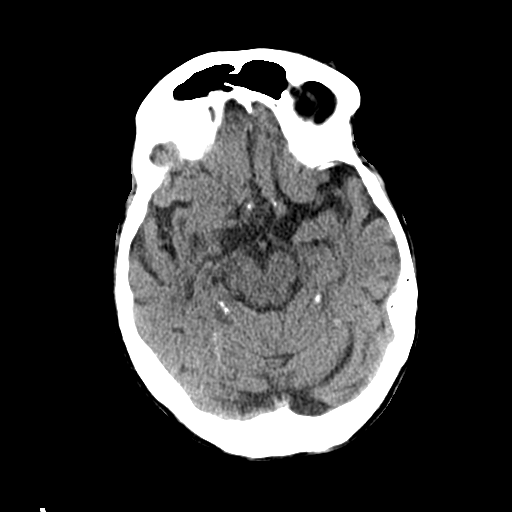
[im 15/34  brain]
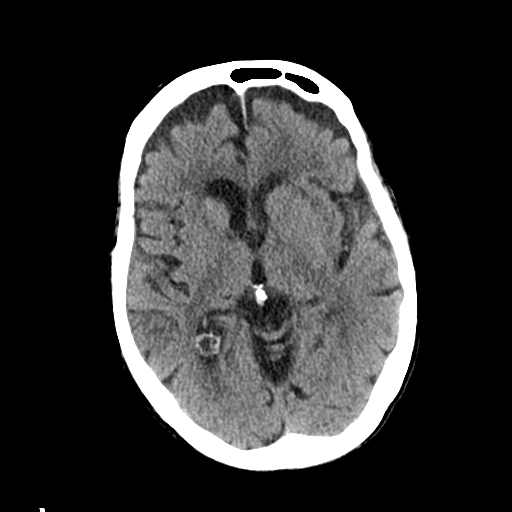
[im 15/34  bone]
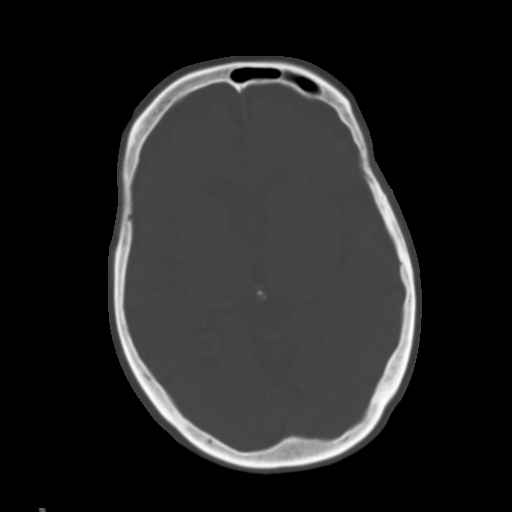
[im 19/34  brain]
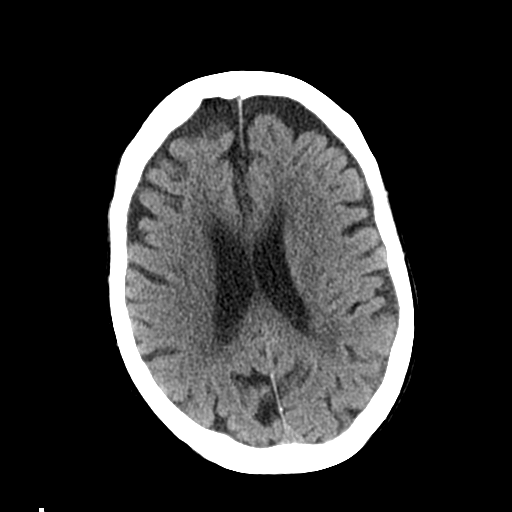
[im 22/34  brain]
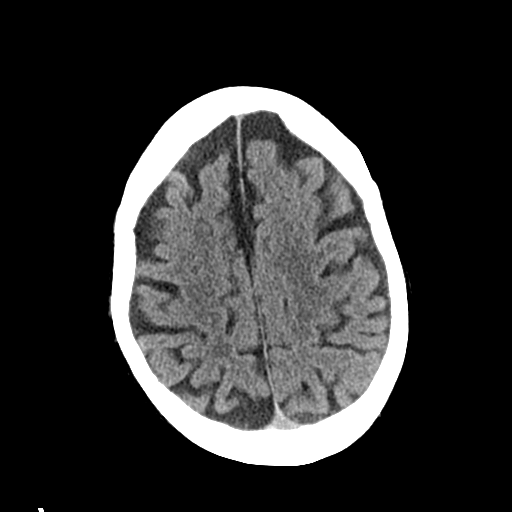
[im 26/34  brain]
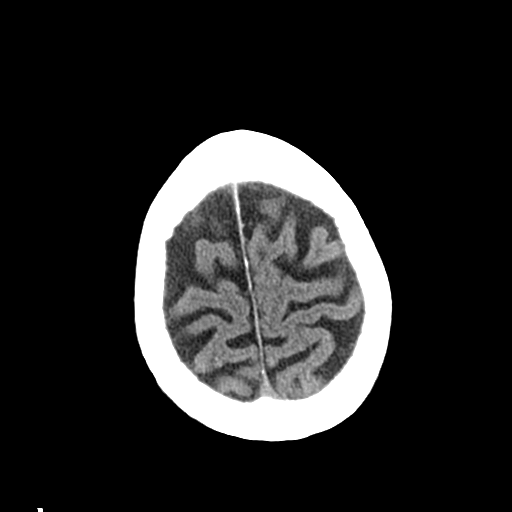
[im 28/34  brain]
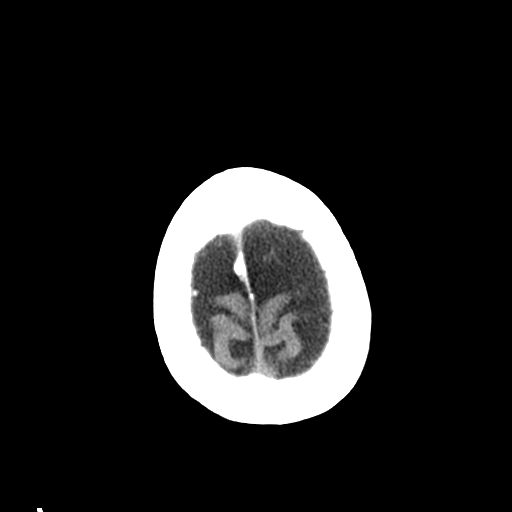
[im 28/34  bone]
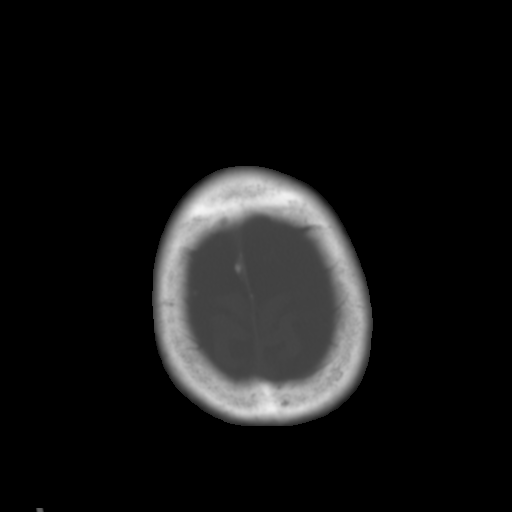
[im 31/34  brain]
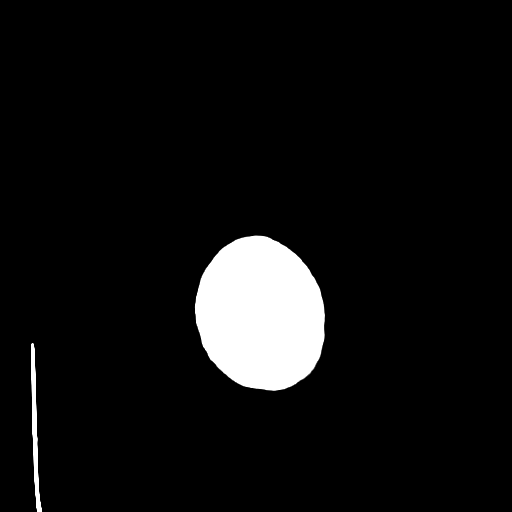

[Series 4: coronal soft tissue · coronal · 0.34mm/px · 3 of 80 slices shown]
[im 27/80  brain]
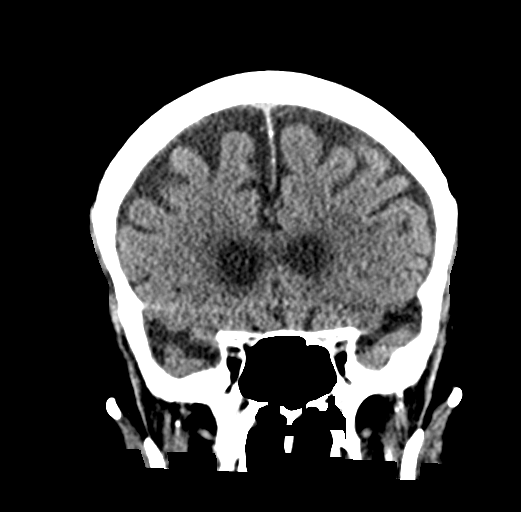
[im 36/80  brain]
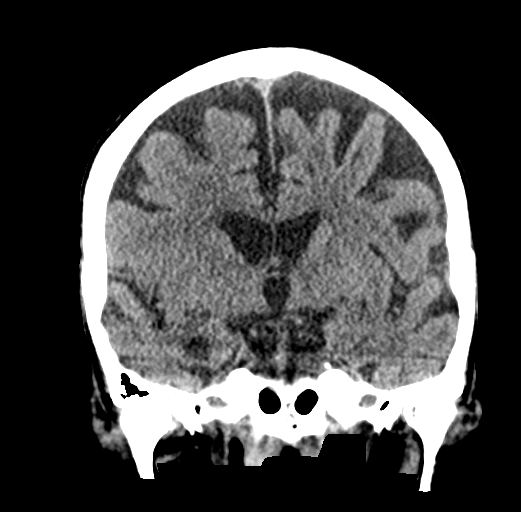
[im 44/80  brain]
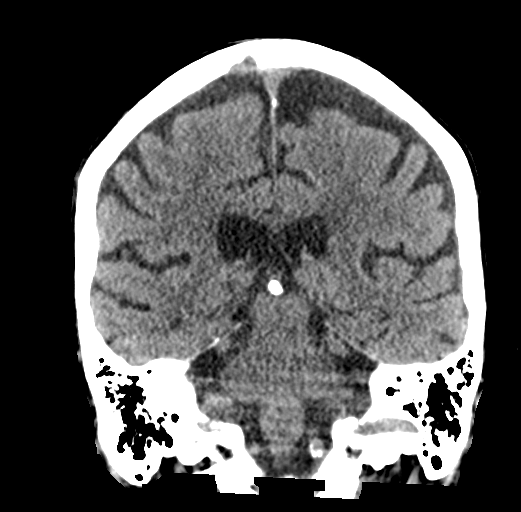

[Series 5: sagittal soft tissue · sagittal · 0.35mm/px · 3 of 59 slices shown]
[im 20/59  brain]
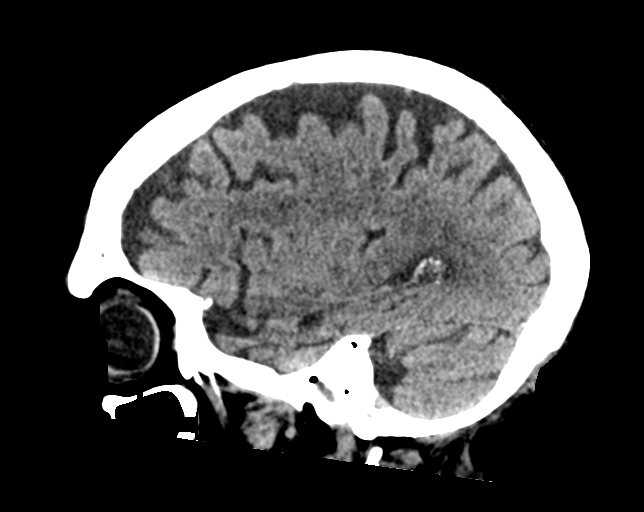
[im 30/59  brain]
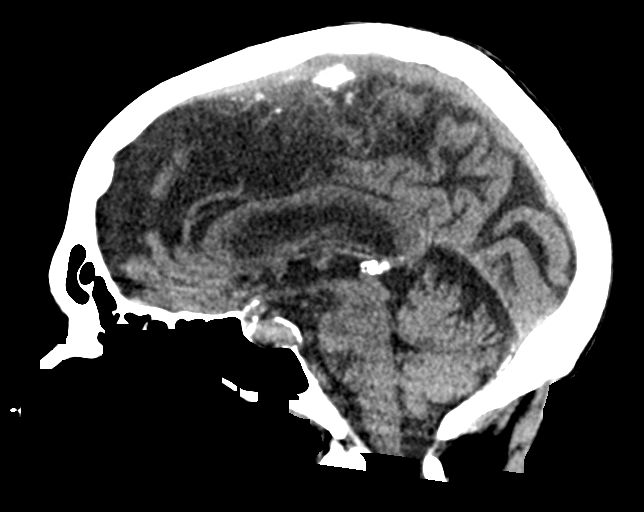
[im 39/59  brain]
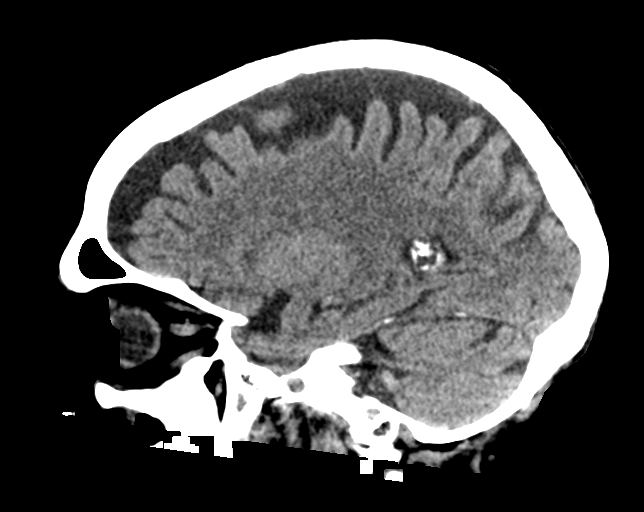

[16 of 47 positions shown; findings below may reference images not displayed]

FINDINGS: Brain: Generalized atrophy, with progression. Mild patchy white
matter hypodensity bilaterally, with progression. Negative for
hydrocephalus.

Negative for acute infarct, hemorrhage, mass.

Vascular: Negative for hyperdense vessel

Skull: Negative

Sinuses/Orbits: Extensive prior sinus surgery. Mild mucosal edema.
Bilateral cataract extraction

Other: None
IMPRESSION: No acute abnormality. Atrophy and chronic microvascular ischemic
change, with progression since [DATE].

## 2023-07-13 DEATH — deceased
